# Patient Record
Sex: Male | Born: 1935 | Race: Black or African American | Hispanic: No | Marital: Married | Smoking: Former smoker
Health system: Southern US, Community
[De-identification: ages and names within clinical notes are randomized; demographics above are authoritative.]

## PROBLEM LIST (undated history)

## (undated) DIAGNOSIS — I350 Nonrheumatic aortic (valve) stenosis: Secondary | ICD-10-CM

## (undated) DIAGNOSIS — H02409 Unspecified ptosis of unspecified eyelid: Secondary | ICD-10-CM

## (undated) DIAGNOSIS — I10 Essential (primary) hypertension: Secondary | ICD-10-CM

## (undated) DIAGNOSIS — K219 Gastro-esophageal reflux disease without esophagitis: Secondary | ICD-10-CM

## (undated) DIAGNOSIS — E11319 Type 2 diabetes mellitus with unspecified diabetic retinopathy without macular edema: Secondary | ICD-10-CM

## (undated) DIAGNOSIS — R251 Tremor, unspecified: Secondary | ICD-10-CM

## (undated) DIAGNOSIS — H3530 Unspecified macular degeneration: Secondary | ICD-10-CM

## (undated) DIAGNOSIS — R269 Unspecified abnormalities of gait and mobility: Secondary | ICD-10-CM

## (undated) DIAGNOSIS — I639 Cerebral infarction, unspecified: Secondary | ICD-10-CM

## (undated) DIAGNOSIS — R011 Cardiac murmur, unspecified: Secondary | ICD-10-CM

## (undated) DIAGNOSIS — E119 Type 2 diabetes mellitus without complications: Secondary | ICD-10-CM

## (undated) DIAGNOSIS — H348392 Tributary (branch) retinal vein occlusion, unspecified eye, stable: Secondary | ICD-10-CM

## (undated) DIAGNOSIS — R202 Paresthesia of skin: Secondary | ICD-10-CM

## (undated) DIAGNOSIS — H3581 Retinal edema: Secondary | ICD-10-CM

## (undated) DIAGNOSIS — F32A Depression, unspecified: Secondary | ICD-10-CM

## (undated) DIAGNOSIS — M109 Gout, unspecified: Secondary | ICD-10-CM

## (undated) DIAGNOSIS — Z961 Presence of intraocular lens: Secondary | ICD-10-CM

## (undated) DIAGNOSIS — I2510 Atherosclerotic heart disease of native coronary artery without angina pectoris: Secondary | ICD-10-CM

## (undated) DIAGNOSIS — N400 Enlarged prostate without lower urinary tract symptoms: Secondary | ICD-10-CM

## (undated) HISTORY — DX: Essential (primary) hypertension: I10

## (undated) HISTORY — DX: Cerebral infarction, unspecified: I63.9

## (undated) HISTORY — DX: Unspecified abnormalities of gait and mobility: R26.9

## (undated) HISTORY — DX: Unspecified ptosis of unspecified eyelid: H02.409

## (undated) HISTORY — PX: COLONOSCOPY W/ POLYPECTOMY: SHX1380

## (undated) HISTORY — PX: PROSTATE SURGERY: SHX751

## (undated) HISTORY — DX: Paresthesia of skin: R20.2

## (undated) HISTORY — DX: Tremor, unspecified: R25.1

## (undated) HISTORY — DX: Unspecified macular degeneration: H35.30

## (undated) HISTORY — DX: Type 2 diabetes mellitus without complications: E11.9

---

## 1997-12-17 ENCOUNTER — Ambulatory Visit (HOSPITAL_COMMUNITY): Admission: RE | Admit: 1997-12-17 | Discharge: 1997-12-17 | Payer: Self-pay | Admitting: Endocrinology

## 2006-03-09 ENCOUNTER — Encounter (INDEPENDENT_AMBULATORY_CARE_PROVIDER_SITE_OTHER): Payer: Self-pay | Admitting: *Deleted

## 2006-03-09 ENCOUNTER — Ambulatory Visit (HOSPITAL_COMMUNITY): Admission: RE | Admit: 2006-03-09 | Discharge: 2006-03-09 | Payer: Self-pay | Admitting: *Deleted

## 2006-07-05 ENCOUNTER — Ambulatory Visit: Payer: Self-pay | Admitting: Cardiology

## 2006-07-05 ENCOUNTER — Inpatient Hospital Stay (HOSPITAL_COMMUNITY): Admission: EM | Admit: 2006-07-05 | Discharge: 2006-07-09 | Payer: Self-pay | Admitting: Emergency Medicine

## 2006-07-06 ENCOUNTER — Encounter: Payer: Self-pay | Admitting: Cardiology

## 2006-07-06 ENCOUNTER — Encounter: Payer: Self-pay | Admitting: Vascular Surgery

## 2006-08-11 ENCOUNTER — Encounter: Admission: RE | Admit: 2006-08-11 | Discharge: 2006-08-11 | Payer: Self-pay | Admitting: Endocrinology

## 2006-10-24 ENCOUNTER — Encounter (HOSPITAL_BASED_OUTPATIENT_CLINIC_OR_DEPARTMENT_OTHER): Admission: RE | Admit: 2006-10-24 | Discharge: 2006-11-09 | Payer: Self-pay | Admitting: Surgery

## 2008-01-22 ENCOUNTER — Emergency Department (HOSPITAL_COMMUNITY): Admission: EM | Admit: 2008-01-22 | Discharge: 2008-01-22 | Payer: Self-pay | Admitting: Emergency Medicine

## 2008-06-19 ENCOUNTER — Encounter (INDEPENDENT_AMBULATORY_CARE_PROVIDER_SITE_OTHER): Payer: Self-pay | Admitting: *Deleted

## 2008-06-19 ENCOUNTER — Ambulatory Visit (HOSPITAL_COMMUNITY): Admission: RE | Admit: 2008-06-19 | Discharge: 2008-06-19 | Payer: Self-pay | Admitting: *Deleted

## 2009-08-16 ENCOUNTER — Encounter: Admission: RE | Admit: 2009-08-16 | Discharge: 2009-08-16 | Payer: Self-pay | Admitting: Endocrinology

## 2009-12-18 HISTORY — PX: CATARACT EXTRACTION W/ INTRAOCULAR LENS IMPLANT: SHX1309

## 2010-09-06 ENCOUNTER — Emergency Department (HOSPITAL_COMMUNITY): Payer: Medicare Other

## 2010-09-06 ENCOUNTER — Inpatient Hospital Stay (HOSPITAL_COMMUNITY)
Admission: EM | Admit: 2010-09-06 | Discharge: 2010-09-07 | DRG: 726 | Disposition: A | Discharge status: Home / Self Care | Payer: Medicare Other | Attending: Internal Medicine | Admitting: Internal Medicine

## 2010-09-06 DIAGNOSIS — R310 Gross hematuria: Secondary | ICD-10-CM | POA: Diagnosis present

## 2010-09-06 DIAGNOSIS — N138 Other obstructive and reflux uropathy: Principal | ICD-10-CM | POA: Diagnosis present

## 2010-09-06 DIAGNOSIS — IMO0001 Reserved for inherently not codable concepts without codable children: Secondary | ICD-10-CM | POA: Diagnosis present

## 2010-09-06 DIAGNOSIS — I129 Hypertensive chronic kidney disease with stage 1 through stage 4 chronic kidney disease, or unspecified chronic kidney disease: Secondary | ICD-10-CM | POA: Diagnosis present

## 2010-09-06 DIAGNOSIS — D72829 Elevated white blood cell count, unspecified: Secondary | ICD-10-CM | POA: Diagnosis present

## 2010-09-06 DIAGNOSIS — N183 Chronic kidney disease, stage 3 unspecified: Secondary | ICD-10-CM | POA: Diagnosis present

## 2010-09-06 DIAGNOSIS — N401 Enlarged prostate with lower urinary tract symptoms: Principal | ICD-10-CM | POA: Diagnosis present

## 2010-09-06 DIAGNOSIS — R339 Retention of urine, unspecified: Secondary | ICD-10-CM | POA: Diagnosis present

## 2010-09-06 DIAGNOSIS — M109 Gout, unspecified: Secondary | ICD-10-CM | POA: Diagnosis present

## 2010-09-06 DIAGNOSIS — R109 Unspecified abdominal pain: Secondary | ICD-10-CM | POA: Diagnosis present

## 2010-09-06 DIAGNOSIS — Z794 Long term (current) use of insulin: Secondary | ICD-10-CM

## 2010-09-06 LAB — URINALYSIS, ROUTINE W REFLEX MICROSCOPIC
Nitrite: NEGATIVE
Protein, ur: 100 mg/dL — AB
Urobilinogen, UA: 0.2 mg/dL (ref 0.0–1.0)

## 2010-09-06 LAB — DIFFERENTIAL
Basophils Absolute: 0 10*3/uL (ref 0.0–0.1)
Basophils Relative: 0 % (ref 0–1)
Eosinophils Absolute: 0 10*3/uL (ref 0.0–0.7)
Eosinophils Relative: 0 % (ref 0–5)
Neutrophils Relative %: 92 % — ABNORMAL HIGH (ref 43–77)

## 2010-09-06 LAB — HEPATIC FUNCTION PANEL
Indirect Bilirubin: 1.2 mg/dL — ABNORMAL HIGH (ref 0.3–0.9)
Total Protein: 7.5 g/dL (ref 6.0–8.3)

## 2010-09-06 LAB — BASIC METABOLIC PANEL
Calcium: 10.1 mg/dL (ref 8.4–10.5)
Creatinine, Ser: 1.61 mg/dL — ABNORMAL HIGH (ref 0.4–1.5)
GFR calc Af Amer: 51 mL/min — ABNORMAL LOW (ref >60)
GFR calc non Af Amer: 42 mL/min — ABNORMAL LOW (ref >60)
Sodium: 134 mEq/L — ABNORMAL LOW (ref 135–145)

## 2010-09-06 LAB — CBC
Platelets: 313 10*3/uL (ref 150–400)
RBC: 5.29 MIL/uL (ref 4.22–5.81)
WBC: 21.7 10*3/uL — ABNORMAL HIGH (ref 4.0–10.5)

## 2010-09-06 LAB — URINE MICROSCOPIC-ADD ON

## 2010-09-07 ENCOUNTER — Inpatient Hospital Stay (HOSPITAL_COMMUNITY): Payer: Medicare Other

## 2010-09-07 LAB — CBC
Hemoglobin: 13.9 g/dL (ref 13.0–17.0)
MCH: 28.8 pg (ref 26.0–34.0)
MCHC: 34.7 g/dL (ref 30.0–36.0)
Platelets: 280 10*3/uL (ref 150–400)
WBC: 16 10*3/uL — ABNORMAL HIGH (ref 4.0–10.5)

## 2010-09-07 LAB — BASIC METABOLIC PANEL
BUN: 20 mg/dL (ref 6–23)
Chloride: 106 mEq/L (ref 96–112)
GFR calc Af Amer: 55 mL/min — ABNORMAL LOW (ref >60)
GFR calc non Af Amer: 45 mL/min — ABNORMAL LOW (ref >60)

## 2010-09-07 LAB — HEPATIC FUNCTION PANEL
ALT: 33 U/L (ref 0–53)
AST: 27 U/L (ref 0–37)
Albumin: 3.1 g/dL — ABNORMAL LOW (ref 3.5–5.2)
Alkaline Phosphatase: 48 U/L (ref 39–117)
Total Protein: 6.1 g/dL (ref 6.0–8.3)

## 2010-09-07 LAB — GLUCOSE, CAPILLARY
Glucose-Capillary: 208 mg/dL — ABNORMAL HIGH (ref 70–99)
Glucose-Capillary: 187 mg/dL — ABNORMAL HIGH (ref 70–99)
Glucose-Capillary: 86 mg/dL (ref 70–99)
Glucose-Capillary: 250 mg/dL — ABNORMAL HIGH (ref 70–99)

## 2010-09-07 LAB — URINALYSIS, ROUTINE W REFLEX MICROSCOPIC
Leukocytes, UA: NEGATIVE
Protein, ur: 30 mg/dL — AB
Urine Glucose, Fasting: NEGATIVE mg/dL
pH: 6 (ref 5.0–8.0)

## 2010-09-07 LAB — HEMOGLOBIN A1C
Hgb A1c MFr Bld: 7.2 % — ABNORMAL HIGH (ref <5.7)
Mean Plasma Glucose: 160 mg/dL — ABNORMAL HIGH (ref <117)

## 2010-09-07 LAB — URINE MICROSCOPIC-ADD ON

## 2010-09-07 LAB — DIFFERENTIAL
Lymphocytes Relative: 12 % (ref 12–46)
Lymphs Abs: 1.8 10*3/uL (ref 0.7–4.0)
Neutrophils Relative %: 79 % — ABNORMAL HIGH (ref 43–77)

## 2010-09-08 LAB — URINE CULTURE: Culture  Setup Time: 201202262106

## 2010-09-12 NOTE — Consult Note (Signed)
Johnny [name], Johnny [name]               ACCOUNT NO.:  [id number]  MEDICAL RECORD NO.:  [id number]           PATIENT TYPE:  I  LOCATION:  1314                         FACILITY:  [facility]  PHYSICIAN:  Heloise Purpura, MD      DATE OF BIRTH:  [birth year]  DATE OF CONSULTATION:  09/07/2010 DATE OF DISCHARGE:                                CONSULTATION   REASON FOR CONSULTATION:  Urinary retention and gross hematuria.  PHYSICIAN REQUESTING CONSULTATION:  [name], M.D.  HISTORY:  Mr. Leonhard is a 77 year old gentleman who has been followed by [name] for the past 20 years for history of BPH and voiding symptoms. He has been managed with chronic alpha-blocker therapy with tamsulosin. At his most recent office visit in September 2011, he was noted to have a stable voiding situation and continued on tamsulosin 0.4 mg p.o. q.h.s.  He apparently had developed worsening urinary frequency and some mild urinary incontinence and was seen by his primary care physician recently who started him on anticholinergic therapy with VESIcare. Yesterday morning, he developed painless gross hematuria and an inability to urinate and presented to the emergency department with severe lower abdominal pain.  A Foley catheter was inserted without difficulty with return of greater than 1 L of grossly bloody urine. This immediately resolved his abdominal pain symptoms.  He has denied any CVA tenderness, fever, or vomiting.  He has had mild associated nausea.  He was also noted to have a leukocytosis of 413[address].  He did undergo a CT scan in the emergency department which did not reveal any hydronephrosis but did reveal a Foley catheter within the expected position in the bladder.  He also had a large "bladder mass" possibly consistent with an intravesical lobe of an enlarged prostate.  He has been started empirically on IV Rocephin and was admitted to [facility] for further evaluation.  PAST MEDICAL  HISTORY: 1. Diabetes. 2. BPH. 3. Gout. 4. Hypertension.  PAST SURGICAL HISTORY:  None.  HOME MEDICATIONS: 1. Insulin. 2. Metformin. 3. Cardizem. 4. Allopurinol. 5. Bystolic. 6. VESIcare. 7. Tamsulosin. 8. Benicar.  ALLERGIES:  No known drug allergies.  SOCIAL HISTORY:  The patient is married.  He quit smoking approximately 40 years ago.  He denies alcohol or drug use.  FAMILY HISTORY:  He does have a brother with a history of prostate cancer.  There is also family history of hypertension, diabetes, and breast and lung cancer.  REVIEW OF SYSTEMS:  A complete review of systems was performed. Pertinent positives and negatives are as included above in the history of present illness.  PHYSICAL EXAMINATION:  VITAL SIGNS:  Temperature 98.3, pulse 76, blood pressure 190/87, respirations 20. CONSTITUTIONAL:  The patient is a well-nourished, well-developed age- appropriate male, in no acute distress. HEENT:  Normocephalic, atraumatic. NECK:  Supple without lymphadenopathy. CARDIOVASCULAR:  Regular rate and rhythm without obvious murmurs.  He does not have peripheral edema. PULMONARY:  Normal respiratory effort.  Lungs are clear to auscultation bilaterally. ABDOMEN:  Soft, nontender, nondistended without abdominal masses. BACK:  No CVA tenderness. GU:  The patient has normal external male genitalia with  an indwelling Foley catheter with grossly clear urine draining from the catheter. DRE:  Normal sphincter tone with a significantly enlarged prostate without prostatic nodularity. EXTREMITIES:  No edema. NEUROLOGIC:  Grossly intact.  LABORATORY DATA:  Urinalysis, too numerous to count red blood cells, 0 to 2 white blood cells.  White blood count on admission was 21.7 and has decreased to 16.0.  Hemoglobin 13.9.  Serum creatinine was 1.61 on admission and has decreased to 1.51.  RADIOLOGIC IMAGING:  I independently reviewed his CT scan of the abdomen and pelvis which was  performed without contrast.  There was no hydronephrosis.  His Foley catheter is in the appropriate position.  He does have a bladder mass that is identified and is consistent with an intravesical prostatic lobes.  IMPRESSION: 1. Gross hematuria. 2. Acute urinary retention.  RECOMMENDATIONS:  His urinary retention is likely related to the fact he has been recently placed on anticholinergic medication.  I would recommend that this be stopped and that he continue on alpha blocker medication and I will increase his tamsulosin to 0.8 mg.  He also may require further evaluation of his gross hematuria, although this would be most likely related to his BPH.  I will plan on [name] see Mr. Antone in followup in the hospital to discuss further recommendations and when it would be most appropriate to proceed with a subsequent voiding trial.     Heloise Purpura, MD     LB/MEDQ  D:  09/07/2010  T:  09/07/2010  Job:  [id number]  cc:   Lindaann Slough, M.D. Fax: [phone]  Brooke Bonito, M.D. Fax: [phone]  [name], M.D. Fax: [phone]  Electronically Signed by Heloise Purpura MD on 09/12/2010 08:03:27 AM

## 2010-09-14 NOTE — H&P (Signed)
NAMESLAYTON, [name] NO.:  [id number]  MEDICAL RECORD NO.:  [id number]           PATIENT TYPE:  E  LOCATION:  WLED                         FACILITY:  [facility]  PHYSICIAN:  [name], M.D. DATE OF BIRTH:  29-Mar-1933  DATE OF ADMISSION:  09/06/2010 DATE OF DISCHARGE:                             HISTORY & PHYSICAL   PRIMARY CARE PHYSICIAN:  [name], DO  CHIEF COMPLAINT:  Abdominal pain and distention.  HISTORY OF PRESENT ILLNESS:  This is a 77 year old male who was brought to the emergency department by his wife secondary to lower abdominal pain and difficulty passing his urine since the a.m.  The patient denies having any fever or chills.  He reports when he was able to pass a little bit of urine, the urine had blood in it.  The patient has a history of benign prostatic hypertrophy and sees urologist, [name]. The patient rates having lower abdominal pain, 10/10 and also states he has had nausea and vomiting.  The patient was seen in the emergency department.  A bladder scan was performed and the patient was found to have 800 mL of urine in his bladder.  A Foley catheter was then placed which yielded 1200 mL of bloody urine.  His laboratory studies revealed an elevated [place] at [address].  A urine culture and sensitivity was ordered and the patient was placed on IV Rocephin and referred for medical admission.  The emergency department physician, Dr. Clarene Duke was asked to consult urology as well on this patient's case.  PAST MEDICAL HISTORY:  Significant for hypertension, benign prostatic hypertrophy, gout, type 2 diabetes mellitus on insulin, hernia.  PAST SURGICAL HISTORY:  None.  MEDICATIONS:  At this time include: 1. Humalog insulin 50 units subcu b.i.d. 2. Metformin 1000 mg p.o. b.i.d. 3. Cardizem CD 240 mg 1 p.o. daily. 4. Allopurinol 100 mg one p.o. b.i.d. 5. Bystolic 20 mg one p.o. daily. 6. Biaxin 500 mg p.o. once daily. 7.  Vesicare 5 mg one p.o. daily. 8. Zegerid 1 tablet p.o. daily. 9. Benicar 40 mg one p.o. daily.  ALLERGIES:  No known drug allergies.  SOCIAL HISTORY:  The patient is married.  He is a former smoker.  He quit 40 years ago.  He is a nondrinker.  No history of illicit drug usage.  FAMILY HISTORY:  Positive for hypertension and diabetes in his mother. Positive for cancer in a sister who had breast cancer and 2 brothers with cancer, one brother had prostate cancer, the other brother had lung cancer.  REVIEW OF SYSTEMS:  Pertinents as mentioned above.  PHYSICAL EXAMINATION FINDINGS:  GENERAL:  This is a 77 year old mildly overweight elderly African American male who is in discomfort but no acute distress. VITAL SIGNS:  Temperature 97.5, blood pressure elevated at 236/125, now 204/102, heart rate 74, respirations 22, O2 sats 97%. HEENT EXAMINATION:  Normocephalic, atraumatic.  Pupils equally round and reactive to light.  Extraocular movements are intact.  Funduscopic benign.  Positive arcus senilis present.  There is no scleral icterus. No conjunctival erythema.  Nares are patent bilaterally.  Oropharynx is clear. NECK:  Supple.  Full range of motion.  No thyromegaly, adenopathy, jugular venous distention. CARDIOVASCULAR:  Regular rate and rhythm.  No murmurs, gallops or rubs appreciated. LUNGS:  Clear to auscultation bilaterally.  No rales, rhonchi or wheezes. ABDOMEN:  Positive bowel sounds, soft, tender in the suprapubic area. There is no rebound, no guarding. ABDOMEN:  Obese with mild distention.  There is no hepatosplenomegaly. EXTREMITIES:  Without cyanosis, clubbing or edema. NEUROLOGIC EXAMINATION:  Nonfocal.  LABORATORY STUDIES:  White blood cell count 21.7, hemoglobin 15.7, hematocrit 43.8, MCV 82.8, platelets 213, neutrophils 92%, lymphocytes 4%.  Sodium 134, potassium 4.7, chloride 100, CO2 22, BUN 22, creatinine 1.61 and glucose 287, albumin 3.9, AST 42, ALT 46, total  bilirubin 1.5. Lipase level 78.  Urinalysis reveals red color, turbid in appearance, specific gravity 1.008, pH 6.5, large urine hemoglobin, trace leukocyte esterase.  Urine microscopic with white blood cell 0 to 2, urine red blood cells too numerous to count.  CT scan of the abdomen and pelvis performed without contrast, massive enlargement of prostate gland or a large bladder mass, perinephric soft tissue stranding with fluid in the perinephric spaces are seen along the ureters bilaterally but there is only minimal dilatation of the ureters, 5.3 cm complex cyst in the upper pole of the right kidney, several low-density lesions in the right kidney, most likely being cysts.  Low-density lesion in the midportion of the left kidney as well.  Cholelithiasis seen as well.  ASSESSMENT:  A 77 year old male being admitted with: 1. Bilateral hydronephrosis. 2. Hematuria. 3. Abdominal pain. 4. Urinary tract infection, most likely pyelonephritis and cystitis. 5. Acute renal failure. 6. Malignant hypertension.  PLAN:  The patient will be admitted.  A Foley catheter has already been placed and the patient is now able to pass his urine.  Suspect bladder outlet obstruction from BPH.  A urine culture and sensitivity has been ordered and the patient has been placed on IV Rocephin therapy at this time.  The patient's regular medications will be further reviewed.  The patient has been placed on gentle IV fluids for rehydration therapy. Urology consultation has been requested.  The patient will be placed on sliding scale insulin coverage to cover his elevated blood sugars and his blood sugars will be monitored.  The patient's metformin will be discontinued at this time as well as his ACE inhibitor therapy and ARB therapy.  The patient will be placed in SCDs for DVT prophylaxis and further workup will ensue pending results of the patient's clinical course and his studies.     [name],  M.D.     HJ/MEDQ  D:  09/06/2010  T:  09/06/2010  Job:  [id number]  cc:   Dr. Noel Gerold  Electronically Signed by [name] M.D. on 09/14/2010 08:25:15 PM

## 2010-09-25 ENCOUNTER — Other Ambulatory Visit: Payer: Self-pay | Admitting: Urology

## 2010-09-25 ENCOUNTER — Encounter (HOSPITAL_COMMUNITY): Payer: Medicare Other | Attending: Urology

## 2010-09-25 DIAGNOSIS — Z01812 Encounter for preprocedural laboratory examination: Secondary | ICD-10-CM | POA: Insufficient documentation

## 2010-09-25 DIAGNOSIS — Z01810 Encounter for preprocedural cardiovascular examination: Secondary | ICD-10-CM | POA: Insufficient documentation

## 2010-09-25 LAB — CBC
HCT: 42.1 % (ref 39.0–52.0)
Hemoglobin: 14.2 g/dL (ref 13.0–17.0)
MCHC: 33.7 g/dL (ref 30.0–36.0)
MCV: 85.2 fL (ref 78.0–100.0)
RDW: 13.7 % (ref 11.5–15.5)

## 2010-09-25 LAB — COMPREHENSIVE METABOLIC PANEL
Albumin: 3.6 g/dL (ref 3.5–5.2)
BUN: 16 mg/dL (ref 6–23)
Calcium: 10 mg/dL (ref 8.4–10.5)
Glucose, Bld: 138 mg/dL — ABNORMAL HIGH (ref 70–99)
Total Protein: 7.2 g/dL (ref 6.0–8.3)

## 2010-09-25 LAB — APTT: aPTT: 35 seconds (ref 24–37)

## 2010-09-25 LAB — PROTIME-INR: INR: 1.01 (ref 0.00–1.49)

## 2010-09-25 LAB — URINE MICROSCOPIC-ADD ON

## 2010-09-25 LAB — ABO/RH: ABO/RH(D): B POS

## 2010-09-25 LAB — URINALYSIS, ROUTINE W REFLEX MICROSCOPIC
Bilirubin Urine: NEGATIVE
Nitrite: NEGATIVE
Specific Gravity, Urine: 1.011 (ref 1.005–1.030)
Urobilinogen, UA: 0.2 mg/dL (ref 0.0–1.0)
pH: 5.5 (ref 5.0–8.0)

## 2010-09-25 LAB — SURGICAL PCR SCREEN
MRSA, PCR: NEGATIVE
Staphylococcus aureus: NEGATIVE

## 2010-09-27 LAB — URINE CULTURE
Culture  Setup Time: 201203161516
Special Requests: NEGATIVE

## 2010-09-28 NOTE — Discharge Summary (Signed)
NAMEWISTER, Johnny [name]               ACCOUNT NO.:  [id number]  MEDICAL RECORD NO.:  [id number]           PATIENT TYPE:  I  LOCATION:  1314                         FACILITY:  [facility]  PHYSICIAN:  Marcellus Scott, MD     DATE OF BIRTH:  [birth year]  DATE OF ADMISSION:  09/06/2010 DATE OF DISCHARGE:  09/07/2010                              DISCHARGE SUMMARY   PRIMARY CARE PHYSICIAN:  [name], DO.  UROLOGIST:  Lindaann Slough, M.D.  DISCHARGE DIAGNOSES: 1. Urinary retention secondary to benign prostatic hypertrophy. 2. Hematuria, likely secondary to benign prostatic hypertrophy. 3. Hypertension. 4. Uncontrolled type 2 diabetes mellitus. 5. Leukocytosis, possibly stress-related 6. Possible stage 3 chronic kidney disease. 7. History of gout.  DISCHARGE MEDICATIONS: 1. Flomax increased to 0.8 mg p.o. q.h.s. 2. Allopurinol 200 mg p.o. q.p.m. 3. Benicar 40 mg p.o. daily. 4. Bystolic 20 mg p.o. daily. 5. Cardizem CD 240 mg p.o. daily. 6. Humalog mix 50/50, 48 units subcutaneously b.i.d. 7. Zegerid 10 mg p.o. daily.  DISCONTINUED MEDICATIONS: 1. Metformin 2. Biaxin 3. VESIcare  PROCEDURES:  Insertion of Foley catheter.  IMAGING:  CT of the abdomen and pelvis without contrast, impression: A.  There is either massive enlargement of the prostate gland or large mass in the bladder. B.  Evidence of recent bilateral hydronephrosis which may have been relieved by the insertion of the Foley catheter C.  Cholelithiasis. D.  Complex cyst in the upper pole of the right kidney.  PERTINENT LABS:  Hepatic panel only remarkable for albumin of 3.1. Basic metabolic panel with glucose 239, BUN 20, creatinine 1.51.  CBC; hemoglobin 13.9, hematocrit 40, white blood cell 16, platelets 280, lipase 78.  Urinalysis showed too numerous to count red blood cells,  0 to 2 white blood cells and field obscured by rbcs, sperm present.  CONSULTATIONS:  Urology, Dr. Heloise Purpura.  DIET:  Diabetic  diet.  ACTIVITY:  As tolerated.  COMPLAINTS TODAY:  None.  REVIEW OF SYSTEMS:  The patient denies any sore throat or earache or cough or chest pain or dyspnea or abdominal pain.  No nausea or vomiting.  He has no dysuria but just difficulty passing urine.  This is relieved with the Foley catheter.  PHYSICAL EXAMINATION:  GENERAL:  The patient is in no obvious distress. VITAL SIGNS:  Temperature 98.3 degrees Fahrenheit, pulse 76 per minute, respirations 20 per minute, blood pressure 190/87 mmHg and saturating at 92% on room air.  CBGs range between 208 to 216 mg/dL. RESPIRATORY SYSTEM:  Clear.  No increased work of breathing. CARDIOVASCULAR SYSTEM:  First and second heart sounds heard, regular. ABDOMEN:  Nondistended, nontender, soft, and bowel sounds present. CENTRAL NERVOUS SYSTEM:  The patient is awake, alert, oriented x3 with no focal neurological deficits. GENITOURINARY:  The patient has a Foley catheter with mildly blood tinged urine.  HOSPITAL COURSE:  Johnny [name] is a 77 year old African-American male patient with history of hypertension, type 2 diabetes mellitus who is on insulin; benign prostatic hypertrophy and gout who presented with the lower abdominal pain and difficulty passing urine.  He had been started on VESIcare approximately 1-1/2 weeks  ago by his primary care physician. Also when he tried to pass urine, he was able to void a small amount which had blood in it.  He had an episode of nausea and vomiting but no coffee-ground or blood.  In the emergency department, he was found to have urinary retention and CT suggesting of hydronephrosis.  Foley catheter was placed and 1200 mL of urine was drained.  He had elevated white cell count of 119[address], although his urinalysis was not suggestive of urinary tract infection.  He was admitted to the medical floor and started on IV Rocephin.Marland Kitchen  He is currently asymptomatic.  His blood pressure is slightly elevated.  I have  discussed his care with Dr. Laverle Patter who indicates that if he is stable from the medical standpoint, he can be discharged with the Foley catheter.  He, however, should not be on the VESIcare and his Flomax dose should be increased to 0.8 mg q.h.s.  Patient will follow up with [name] as an outpatient.  The patient has a preset appointment.  At this time, we will repeat his urinalysis and culture and a chest x-ray and if those are negative, will discharge the patient home on above medications.  DISPOSITION:  The patient will be discharged home in stable condition.  FOLLOWUP RECOMMENDATIONS: 1. With Dr. Su Grand on September 17, 2010, at 10:30 a.m. 2. For blood works at Dr. Vernia Buff Nesi's office on September 10, 2010. 3. With Dr. [name].  The patient is to call for an appointment.  Time taken in coordinating this discharge is 35 minutes.  Addendum: patient briefly had hypoglycemia due to inadvertant administration of larger than his usual doses of short acting insulin. His CBG;s were frequently checked and he and his spouse were updated of this error. Once stable, he was discharged home  and he and his spouse were advised to monitor for hypoglycemic symptoms and they were aware of the its management at home.    Marcellus Scott, MD     AH/MEDQ  D:  09/07/2010  T:  09/07/2010  Job:  [id number]  cc:   [name], DO  Lindaann Slough, M.D. Fax: [phone]  Heloise Purpura, MD Fax: [phone]  Electronically Signed by Marcellus Scott MD on 09/28/2010 01:17:22 AM

## 2010-09-29 ENCOUNTER — Inpatient Hospital Stay (HOSPITAL_COMMUNITY)
Admission: RE | Admit: 2010-09-29 | Discharge: 2010-10-05 | DRG: 708 | Disposition: A | Discharge status: Home / Self Care | Payer: Medicare Other | Source: Ambulatory Visit | Attending: Urology | Admitting: Urology

## 2010-09-29 ENCOUNTER — Other Ambulatory Visit: Payer: Self-pay | Admitting: Urology

## 2010-09-29 DIAGNOSIS — E119 Type 2 diabetes mellitus without complications: Secondary | ICD-10-CM | POA: Diagnosis present

## 2010-09-29 DIAGNOSIS — R338 Other retention of urine: Secondary | ICD-10-CM | POA: Diagnosis present

## 2010-09-29 DIAGNOSIS — I10 Essential (primary) hypertension: Secondary | ICD-10-CM | POA: Diagnosis present

## 2010-09-29 DIAGNOSIS — D649 Anemia, unspecified: Secondary | ICD-10-CM | POA: Diagnosis not present

## 2010-09-29 DIAGNOSIS — N401 Enlarged prostate with lower urinary tract symptoms: Principal | ICD-10-CM | POA: Diagnosis present

## 2010-09-29 DIAGNOSIS — R972 Elevated prostate specific antigen [PSA]: Secondary | ICD-10-CM | POA: Diagnosis present

## 2010-09-29 DIAGNOSIS — E7800 Pure hypercholesterolemia, unspecified: Secondary | ICD-10-CM | POA: Diagnosis present

## 2010-09-29 DIAGNOSIS — N138 Other obstructive and reflux uropathy: Principal | ICD-10-CM | POA: Diagnosis present

## 2010-09-29 LAB — BASIC METABOLIC PANEL
Chloride: 108 mEq/L (ref 96–112)
GFR calc non Af Amer: 51 mL/min — ABNORMAL LOW (ref >60)
Glucose, Bld: 257 mg/dL — ABNORMAL HIGH (ref 70–99)
Potassium: 4.6 mEq/L (ref 3.5–5.1)
Sodium: 136 mEq/L (ref 135–145)

## 2010-09-29 LAB — CBC
HCT: 30.1 % — ABNORMAL LOW (ref 39.0–52.0)
Hemoglobin: 10.2 g/dL — ABNORMAL LOW (ref 13.0–17.0)
MCV: 84.6 fL (ref 78.0–100.0)
RBC: 3.56 MIL/uL — ABNORMAL LOW (ref 4.22–5.81)
RDW: 13.6 % (ref 11.5–15.5)
WBC: 14.6 10*3/uL — ABNORMAL HIGH (ref 4.0–10.5)

## 2010-09-29 LAB — GLUCOSE, CAPILLARY
Glucose-Capillary: 369 mg/dL — ABNORMAL HIGH (ref 70–99)
Glucose-Capillary: 314 mg/dL — ABNORMAL HIGH (ref 70–99)

## 2010-09-30 LAB — GLUCOSE, CAPILLARY
Glucose-Capillary: 228 mg/dL — ABNORMAL HIGH (ref 70–99)
Glucose-Capillary: 230 mg/dL — ABNORMAL HIGH (ref 70–99)
Glucose-Capillary: 243 mg/dL — ABNORMAL HIGH (ref 70–99)
Glucose-Capillary: 259 mg/dL — ABNORMAL HIGH (ref 70–99)

## 2010-09-30 LAB — BASIC METABOLIC PANEL
BUN: 22 mg/dL (ref 6–23)
CO2: 24 mEq/L (ref 19–32)
Calcium: 8.5 mg/dL (ref 8.4–10.5)
Chloride: 102 mEq/L (ref 96–112)
Creatinine, Ser: 2.19 mg/dL — ABNORMAL HIGH (ref 0.4–1.5)
GFR calc Af Amer: 36 mL/min — ABNORMAL LOW (ref >60)
GFR calc Af Amer: 38 mL/min — ABNORMAL LOW (ref >60)
Glucose, Bld: 232 mg/dL — ABNORMAL HIGH (ref 70–99)
Potassium: 5.8 mEq/L — ABNORMAL HIGH (ref 3.5–5.1)
Sodium: 131 mEq/L — ABNORMAL LOW (ref 135–145)

## 2010-09-30 LAB — CBC
MCH: 28.5 pg (ref 26.0–34.0)
MCHC: 34 g/dL (ref 30.0–36.0)
Platelets: 287 10*3/uL (ref 150–400)
RBC: 2.84 MIL/uL — ABNORMAL LOW (ref 4.22–5.81)

## 2010-10-01 LAB — CREATININE, FLUID (PLEURAL, PERITONEAL, JP DRAINAGE)

## 2010-10-01 LAB — GLUCOSE, CAPILLARY
Glucose-Capillary: 175 mg/dL — ABNORMAL HIGH (ref 70–99)
Glucose-Capillary: 189 mg/dL — ABNORMAL HIGH (ref 70–99)

## 2010-10-01 LAB — BASIC METABOLIC PANEL
BUN: 21 mg/dL (ref 6–23)
CO2: 25 mEq/L (ref 19–32)
GFR calc non Af Amer: 38 mL/min — ABNORMAL LOW (ref >60)
Glucose, Bld: 181 mg/dL — ABNORMAL HIGH (ref 70–99)
Potassium: 4.7 mEq/L (ref 3.5–5.1)

## 2010-10-01 LAB — HEMOGLOBIN AND HEMATOCRIT, BLOOD
HCT: 24.3 % — ABNORMAL LOW (ref 39.0–52.0)
Hemoglobin: 6 g/dL — CL (ref 13.0–17.0)
Hemoglobin: 8.2 g/dL — ABNORMAL LOW (ref 13.0–17.0)

## 2010-10-02 LAB — CROSSMATCH: Unit division: 0

## 2010-10-02 LAB — BASIC METABOLIC PANEL
BUN: 15 mg/dL (ref 6–23)
CO2: 25 mEq/L (ref 19–32)
Calcium: 8.2 mg/dL — ABNORMAL LOW (ref 8.4–10.5)
Creatinine, Ser: 1.46 mg/dL (ref 0.4–1.5)
GFR calc non Af Amer: 47 mL/min — ABNORMAL LOW (ref >60)
Glucose, Bld: 202 mg/dL — ABNORMAL HIGH (ref 70–99)
Sodium: 137 mEq/L (ref 135–145)

## 2010-10-02 LAB — GLUCOSE, CAPILLARY
Glucose-Capillary: 205 mg/dL — ABNORMAL HIGH (ref 70–99)
Glucose-Capillary: 203 mg/dL — ABNORMAL HIGH (ref 70–99)

## 2010-10-02 LAB — CBC
HCT: 23.3 % — ABNORMAL LOW (ref 39.0–52.0)
MCH: 29.5 pg (ref 26.0–34.0)
MCHC: 33.9 g/dL (ref 30.0–36.0)
MCV: 86.9 fL (ref 78.0–100.0)
RDW: 14.6 % (ref 11.5–15.5)

## 2010-10-03 LAB — GLUCOSE, CAPILLARY
Glucose-Capillary: 198 mg/dL — ABNORMAL HIGH (ref 70–99)
Glucose-Capillary: 239 mg/dL — ABNORMAL HIGH (ref 70–99)

## 2010-10-03 LAB — CBC
HCT: 27.6 % — ABNORMAL LOW (ref 39.0–52.0)
Hemoglobin: 9.2 g/dL — ABNORMAL LOW (ref 13.0–17.0)
MCHC: 33.3 g/dL (ref 30.0–36.0)
MCV: 87.3 fL (ref 78.0–100.0)
RDW: 14.7 % (ref 11.5–15.5)
WBC: 10.9 10*3/uL — ABNORMAL HIGH (ref 4.0–10.5)

## 2010-10-04 LAB — GLUCOSE, CAPILLARY
Glucose-Capillary: 263 mg/dL — ABNORMAL HIGH (ref 70–99)
Glucose-Capillary: 337 mg/dL — ABNORMAL HIGH (ref 70–99)
Glucose-Capillary: 230 mg/dL — ABNORMAL HIGH (ref 70–99)

## 2010-10-05 LAB — GLUCOSE, CAPILLARY
Glucose-Capillary: 242 mg/dL — ABNORMAL HIGH (ref 70–99)
Glucose-Capillary: 274 mg/dL — ABNORMAL HIGH (ref 70–99)

## 2010-11-07 NOTE — Discharge Summary (Signed)
Johnny [name], Johnny [name]               ACCOUNT NO.:  [id number]  MEDICAL RECORD NO.:  [id number]           PATIENT TYPE:  I  LOCATION:  1432                         FACILITY:  [facility]  PHYSICIAN:  Danae Chen, M.D.  DATE OF BIRTH:  Oct 20, 1932  DATE OF ADMISSION:  09/29/2010 DATE OF DISCHARGE:  10/05/2010                              DISCHARGE SUMMARY   DISCHARGE DIAGNOSES: 1. Acute urinary retention. 2. Benign prostatic hypertrophy. 3. Elevated PSA. 4. Hypertension. 5. Hypercholesterolemia. 6. Diabetes.  PROCEDURE DONE:  Simple retropubic prostatectomy on September 29, 2010.  The patient is a 77 year old male who went into urinary retention about a month ago.  He was started on VESIcare by his primary care physician 2 days prior to that episode of urinary retention.  He went to the emergency room and a Foley catheter was left in the bladder.  CT scan showed normal kidneys.  The patient is known to have a markedly enlarged prostate.  He had at least 2 episodes of urinary retention before then. He had 5 negative prostate biopsies in the past for elevated PSA.  He has been on tamsulosin.  Cystoscopy showed a markedly enlarged prostate. The patient was admitted on September 29, 2010 for simple retropubic prostatectomy.  On physical examination, his blood pressure was 208/105, pulse 85, respirations 20, temperature 97.5.  Lungs were clear.  Heart, regular rhythm.  Abdomen, soft, nontender.  No CVA tenderness.  Kidneys not palpable.  No hepatomegaly, no splenomegaly.  Bladder not distended. Bowel sounds normal.  Genitalia, the penis is uncircumcised.  Scrotum is normal.  He has no testicular mass.  Cords and epididymis are within normal limits.  The patient had a simple retropubic prostatectomy on September 29, 2010. His hemoglobin gradually went down to 6.0.  He was then given 2 units of packed cells and his hemoglobin has since been going up and it was 9.2 on March 24.  His creatinine the day  after the surgery was up to 2.19 and on March 23, it went down to 1.46.  He was then started on clear liquid diet the day after the procedure and the diet was gradually advanced.  He tolerated his diet well and he has regular bowel movements.  The CBI was discontinued on March 23 and the urine has gradually cleared up and it is currently normal.  The Jackson-Pratt drain was removed on the third day postop and on March 26, he was afebrile.  He was eating well.  He has regular bowel movement.  He was then discharged home on all his home medications and Cipro 500 mg twice a day, Colace 100 mg twice a day, ferrous sulfate 1 tablet 3 times a day.  CONDITION ON DISCHARGE:  Improved.  DISCHARGE DIET:  An 1800 calories diabetic diet.  He is instructed not to do any lifting, straining, or driving.  He will be followed in the office in 3 days for Foley and skin staple removal.  Pathology report showed BPH and a prostate that measures 214 g.     Danae Chen, M.D.     MN/MEDQ  D:  10/05/2010  T:  10/05/2010  Job:  [id number]  Electronically Signed by Lindaann Slough M.D. on 11/07/2010 10:33:01 AM

## 2010-11-07 NOTE — Op Note (Signed)
[name], Johnny               ACCOUNT NO.:  [id number]  MEDICAL RECORD NO.:  [id number]           PATIENT TYPE:  I  LOCATION:  1432                         FACILITY:  [facility]  PHYSICIAN:  Danae Chen, M.D.  DATE OF BIRTH:  May 14, 1933  DATE OF PROCEDURE:  09/29/2010 DATE OF DISCHARGE:                              OPERATIVE REPORT   PREOPERATIVE DIAGNOSES:  Acute urinary retention, benign prostatic hyperplasia.  POSTOPERATIVE DIAGNOSES:  Acute urinary retention, benign prostatic hyperplasia.  PROCEDURE DONE:  Simple retropubic prostatectomy.  SURGEON:  Danae Chen, MD  ASSISTANT:  Delia Chimes, NP-C  ANESTHESIA:  General.  INDICATIONS:  The patient is a 77 year old male who went into urinary retention about 3 weeks ago.  He went to the emergency room and had a Foley catheter inserted in the bladder.  CT scan showed normal kidneys and markedly enlarged prostate.  The patient is known to have a very large prostate that measured 280 cc by ultrasound in February 2011. He had several negative prostate biopsies for elevated PSA.  Cystoscopy showed no bladder stone or tumor.  The patient failed 2 voiding trials on Flomax.  He is scheduled for simple retropubic prostatectomy.  The patient was identified by his wristband and proper time-out was taken.  PROCEDURE IN DETAIL:  Under general anesthesia, he was prepped and draped and placed in the supine position.  A #16 Foley catheter was inserted in the bladder.  A longitudinal incision was made in the suprapubic area from the symphysis pubis to about 2 cm below the umbilicus.  The incision was carried down to the rectus fascia which was then incised.  The recti muscles were split in the midline.  The retropubic space was entered.  The loose areolar tissues anterior to the bladder neck were dissected from the bladder and ligated.  Two stay sutures were placed on the anterior capsule of the prostate.  A transverse capsulotomy was  done.  The capsulotomy was carried down to the level of the prostate gland.  Then with blunt dissection, the prostate was dissected from the capsule of the prostate with index finger.  The patient has a markedly enlarged prostate and I had to split the lobes of the prostate to remove the left lobe and then the right lobe was removed.  Hemostasis was secured at the 5 and 7 o'clock position with #0 chromic.  There was no evidence of bleeding in the prostatic bed.  There was no evidence of tumor in the bladder.  The balloon of the Foley catheter then was then inflated with 60 cc of water.  Continuous bladder irrigation was started and return was clear. The capsulotomy was closed with #0 Vicryl.  The wound was then irrigated with normal saline.  A Blake drain was then placed in the retropubic space and brought out through a separate stab wound.  The fascia was closed with #0 PDS, subcutaneous tissue was approximated with #4-0 chromic. The skin was infiltrated with 0.25% Marcaine.  The skin was closed with skin staples.  The patient tolerated the procedure well and left the OR in satisfactory condition to  post-anesthesia care unit.     Danae Chen, M.D.     MN/MEDQ  D:  09/29/2010  T:  09/29/2010  Job:  [id number]  Electronically Signed by Lindaann Slough M.D. on 11/07/2010 10:31:31 AM

## 2010-11-07 NOTE — H&P (Signed)
Johnny [name], Johnny [name]               ACCOUNT NO.:  [id number]  MEDICAL RECORD NO.:  [id number]           PATIENT TYPE:  I  LOCATION:  0009                         FACILITY:  [facility]  PHYSICIAN:  Danae Chen, M.D.  DATE OF BIRTH:  05-28-1933  DATE OF ADMISSION:  09/29/2010 DATE OF DISCHARGE:                             HISTORY & PHYSICAL   CHIEF COMPLAINT:  Inability to urinate.  HISTORY OF PRESENT ILLNESS:  The patient is 77 year old male who went into urinary retention about 3 weeks ago.  He was started on VESIcare by his primary care physician about 2 days prior to that episode of urinary retention.  He went to the emergency room, and a Foley catheter was inserted in the bladder.  A CT scan showed normal kidneys and enlarged prostate.  The patient is known to have a markedly enlarged prostate that measured about 280 mL by ultrasound in February 2011.  He had at least 2 episodes of urinary retention before.  He has a history of elevated PSA and had at least 5 negative prostate biopsies in the past. He has been on tamsulosin.  Cystoscopy showed markedly enlarged prostate and no bladder stone or tumor.  Since the last episode of urinary retention, he failed 2 voiding trials.  Because of the size of his prostate gland, he is admitted today for simple retropubic prostatectomy.  PAST MEDICAL HISTORY:  Positive for: 1. Diabetes. 2. Gout. 3. Hypercholesterolemia. 4. Hypertension. 5. Macular degeneration.  FAMILY HISTORY:  There is a family history of diabetes.  He is married, has 3 children.  He quit smoking in 1987 and had smoked about a pack a day for 19 years.  He does not drink.  MEDICATIONS: 1. Allopurinol 100 mg a day. 2. Bystolic 20 mg a day. 3. Cardizem CD 240 mg a day. 4. Valturna 300/320 mg a day. 5. Zegerid 40 mg pack. 6. Pravastatin 80 mg. 7. Humalog 50/50.  ALLERGIES:  No known drug allergies.  FAMILY HISTORY:  Positive for diabetes.  His mother is 27 years old,  and she is in good health.  REVIEW OF SYSTEMS:  As noted in the HPI and everything else is negative.  PHYSICAL EXAMINATION:  GENERAL:  This is a well-developed 77 year old male who is in no acute distress at this time. VITAL SIGNS:  His blood pressure this morning was 208/105 but after being in the waiting area for few minutes, blood pressure started to go down.  His pulse rate is 85, respirations 20, and temperature 97.5. HEENT:  His head is normal.  He has pink conjunctivae.  Ears, nose, and throat are within normal limits. NECK:  Supple.  No cervical adenopathy.  No thyromegaly. CHEST:  Symmetrical. LUNGS:  Fully expanded and clear to percussion and auscultation. HEART:  Regular rhythm. ABDOMEN:  Soft, nondistended, and nontender.  He has no CVA tenderness. Kidneys are not palpable.  He has no hepatomegaly, no splenomegaly. Bladder is not distended at this time.  He has no inguinal adenopathy. Bowel sounds are normal. GENITALIA:  He has an indwelling Foley catheter that is draining clear urine.  Scrotum is  normal.  He has no testicular mass.  Cords and epididymis are within normal limits. EXTREMITIES:  Within normal limits.  He has no pedal edema.  No deformities.  Good peripheral pulses. RECTAL EXAMINATION:  Sphincter tone is normal.  Prostate is markedly enlarged, and there are no nodules.  IMPRESSION: 1. Acute urinary retention. 2. Benign prostatic hypertrophy. 3. Elevated prostate specific antigen. 4. Hypertension. 5. Hypercholesterolemia. 6. Gout. 7. Diabetes.     Danae Chen, M.D.     MN/MEDQ  D:  09/29/2010  T:  09/29/2010  Job:  [id number]  Electronically Signed by Lindaann Slough M.D. on 11/07/2010 10:29:20 AM

## 2010-11-24 NOTE — Op Note (Signed)
NAMEDAN, SCEARCE NO.:  [id number]   MEDICAL RECORD NO.:  [id number]          PATIENT TYPE:  AMB   LOCATION:  ENDO                         FACILITY:  [facility]   PHYSICIAN:  Georgiana Spinner, M.D.    DATE OF BIRTH:  1933-03-23   DATE OF PROCEDURE:  06/19/2008  DATE OF DISCHARGE:                               OPERATIVE REPORT   PROCEDURE:  Colonoscopy.   INDICATIONS:  Colon polyps.   ANESTHESIA:  Fentanyl 50 mcg, Versed 5 mg.   PROCEDURE:  With the patient mildly sedated in the left lateral  decubitus position, a rectal examination was performed.  The prostate  was symmetrically enlarged without nodules.  Subsequently, the Pentax  videoscopic colonoscope was inserted in the rectum and passed under  direct vision with pressure applied.  We reached the cecum identified by  ileocecal valve and appendiceal orifice, both of which were  photographed.  From this point, the colonoscope was slowly withdrawn  taking circumferential views of colonic mucosa, stopping in the  transverse colon where a polyp was seen, photographed, and removed using  hot biopsy forceps technique, setting of 20/150 blended current.  We  next stopped in descending colon where a second polyp was seen.  It,  too, was removed in the same manner.  Then, we proceeded to the rectum  which appeared normal on direct and showed hemorrhoids on retroflexed  view.  The endoscope was straightened and withdrawn.  The patient's  vital signs and pulse oximeter remained stable.  The patient tolerated  the procedure well without apparent complications.   FINDINGS:  Internal hemorrhoids, polyp of descending colon and  transverse colon.  Await biopsy report.  The patient will call me for  results and follow up with me as an outpatient.           ______________________________  Georgiana Spinner, M.D.     GMO/MEDQ  D:  06/19/2008  T:  06/19/2008  Job:  [id number]

## 2010-11-24 NOTE — Op Note (Signed)
Johnny [name], Johnny [name] NO.:  [id number]   MEDICAL RECORD NO.:  [id number]          PATIENT TYPE:  AMB   LOCATION:  ENDO                         FACILITY:  [facility]   PHYSICIAN:  Georgiana Spinner, M.D.    DATE OF BIRTH:  June 24, 1933   DATE OF PROCEDURE:  DATE OF DISCHARGE:                               OPERATIVE REPORT   PROCEDURE:  Upper endoscopy.   INDICATIONS:  Gastroesophageal reflux disease with Barrett's esophagus.   ANESTHESIA:  Fentanyl 75 mcg, Versed 7.5 mg.   PROCEDURE:  With the patient mildly sedated in the left lateral  decubitus position, the Pentax videoscopic endoscope was inserted in the  mouth and passed under direct vision through the esophagus which  appeared normal until we reached distal esophagus.  There were changes  of Barrett's, photographed and biopsies taken.  Entered into the stomach  fundus, body, antrum, duodenal bulb, second portion duodenum were  visualized.  From this point the endoscope was slowly withdrawn, taking  circumferential views of duodenal mucosa until the endoscope had been  pulled back into stomach, placed in retroflexion to view the stomach  from below.  The endoscope was straightened and withdrawn taking  circumferential views of the remaining gastric and esophageal mucosa.  The patient's vital signs, pulse oximeter remained stable.  The patient  tolerated procedure well without apparent complications.   FINDINGS:  Barrett's esophagus biopsied.  Await biopsy report.   The patient will call me for results and follow-up with me as an  outpatient.  Proceed to colonoscopy as planned.           ______________________________  Georgiana Spinner, M.D.     GMO/MEDQ  D:  06/19/2008  T:  06/19/2008  Job:  [id number]

## 2010-11-27 NOTE — H&P (Signed)
Johnny [name], Johnny [name]               ACCOUNT NO.:  [id number]   MEDICAL RECORD NO.:  [id number]          PATIENT TYPE:  INP   LOCATION:  5007                         FACILITY:  [facility]   PHYSICIAN:  Beckey Rutter, MD  DATE OF BIRTH:  04-Dec-1932   DATE OF ADMISSION:  07/05/2006  DATE OF DISCHARGE:                              HISTORY & PHYSICAL   CHIEF COMPLAINT:  Right leg swelling and pain.   HISTORY OF PRESENT ILLNESS:  This is a 77 year old African American male  with a past medical history significant for diabetes, hypertension,  hyperlipidemia, thrombocytic hyperplasia, gout and valvular heart  disease.  He has diabetes for the last 20 years.  Patient presented to  the emergency room today because of right leg swelling and pain that  started two days ago and is progressively worsening.  He said that he  does not have any similar condition before and the swelling started  towards the ankle and now kind of extended to the right knee.  There is  also some warmth and tenderness that he mentioned.  Patient stated that  the pain is now improved by medication given in the ED.  The swelling is  still there and he could not put weight on it, so he had to come to the  ER with the use of a wheelchair.   PAST MEDICAL HISTORY:  His past medical history is significant for:  1. Diabetes type 2 for the last 20 years.  He has been taking oral      hypoglycemic agents as well as insulin.  He is not checked      regularly because he was having insurance difficulties recently and      also he was not compliant with medication Actos.  2. Other past medical history is hypertension.  3. Hyperlipidemia.  4. Thrombocytic hyperplasia.  5. Gout.  6. Heart murmur.   FAMILY HISTORY:  Unremarkable.   SOCIAL HISTORY:  Lives with his wife and family.  Denied tobacco use or  alcohol.   DRUG ALLERGIES:  NOT KNOWN DRUG ALLERGIES.   MEDICATIONS:  1. Humalog insulin 50/50 twice a day, 50 units twice a  day.  2. Actos, he is not taking Actos regularly.  3. Cardizem 240 extended release.  4. Benicar 45 per 25.  5. Pravachol 40 mg nightly.  6. Allpurinol 100 mg twice a day.  7. Flomax 0.5 mg daily.   PHYSICAL EXAMINATION:  VITALS:  Blood pressure 154/85, pulse 103,  respiratory rate 22, temperature 98.4.  GENERAL:  He is comfortable in bed.  Not in distress.  No shortness of  breath.  HEENT:  Head atraumatic and normocephalic.  Eyes:  Bilaterally equal to  light.  Pupils equal to light bilaterally.  Mouth:  No ulcer, moist.  NECK:  Supple.  No JVD.  HEART:  First and second heart sounds audible.  Systolic murmur all over  the recorded region.  LUNGS:  Bilateral fair air entry.  EXTREMITIES:  Right leg is swollen and starts from the ankle all the way  up to around the knee.  Warmth and temperature.  The swelling is obvious  compared to the opposite leg.  There is no severe tenderness, but there  is color change to browns and reddish-brown around the sole.  NEUROLOGIC:  Patient alert and oriented x3, communicable and giving  history.  No weakness appreciated.  SKIN:  No abnormalities other than the change as described on the lower  extremity.   LABORATORY DATA:  White count 17.8, hemoglobin 14.2, hematocrit 41,  platelet 225, sodium 134, potassium 3.7, chloride 101, bicarb 25, BUN  28, creatinine 1.7, glucose 264.  EKG is pending.   IMPRESSION/ASSESSMENT:  This is a 77 year old male with longstanding  diabetes, seems to be uncontrolled now coming with right leg clinically  appearing as right leg cellulitis.   PLAN:  1. The patient will be admitted for further assessment and management.  2. The patient will be started on antibiotic, Unasyn and vancomycin.      We are going to add vancomycin for the possibility of community-      acquired methicillin-resistant Staphylococcus aureus pending the      culture and further followup and overall progression of the case.  3. Diabetes.   For diabetes patient will be started on insulin sliding      scale as well as the regular coverage.  We are going to continue      long-acting insulin twice a day.  We will check hemoglobin A1c.  We      will check urine for microalbumin and we will start Glucophage,      metformin orally.  Patient was taking Actos from before admission,      but because of the side effects of lower extremity swelling with      Actos, we will start the patient on metformin and we will continue      on metformin possibly upon discharge.  4. Elevated creatinine.  Patient has elevated creatinine of 1.7.  I      could not find any baseline creatinine to compare what seems to be      a chronic picture.  We will follow up the creatinine level.  We      will continue with gentle hydration for now, hoping for the      creatinine to improve.  If the creatinine continues to be elevated      the way it is, we will consider doing an ultrasound for the kidney      and investigate further for the cause of this renal function.      Chronic renal failure other than diabetes at this time.  5. Hypertension.  Patient might benefit from ACE inhibitors because of      his diabetes, but in the view of his elevated creatinine, we will      hold off on that.  We will continue patient on calcium channel      blocker, Cardizem same dose and reevaluate as needed.  Patient can      be started on ACE inhibitor if the baseline creatinine is allowable      upon discharge.  6. Hyperlipidemia.  Patient was taking Statins before admission.  We      will continue on Statin.  We will start Zocor 40 mg p.o. at night.  7. Gout.  Seems to be stable.  Patient was taking allopurinol 100 mg      twice a day.  We will continue the same dose.  8. Benign prostatic hyperplasia.  We  will continue Flomax as before      admission.  9. Heart murmur and heart condition.  Patient found to have heart     murmur upon admission.  We will order 2-D      echo  during this hospitalization and we will follow as needed      depending on the results of the echo.  10.For DVT prophylaxis, the patient will be started on Lovenox      prophylactic dose.  11.For GI prophylaxis, the patient will be started on Protonix 40 mg      daily.      Beckey Rutter, MD  Electronically Signed     EME/MEDQ  D:  07/05/2006  T:  07/06/2006  Job:  [id number]   cc:   Brooke Bonito, M.D.  Jaclyn Prime. Lucas Mallow, M.D.

## 2010-11-27 NOTE — Discharge Summary (Signed)
Johnny [name], Johnny [name]               ACCOUNT NO.:  [id number]   MEDICAL RECORD NO.:  [id number]          PATIENT TYPE:  INP   LOCATION:  5007                         FACILITY:  [facility]   PHYSICIAN:  Hillery Aldo, M.D.   DATE OF BIRTH:  [birth year]   DATE OF ADMISSION:  07/05/2006  DATE OF DISCHARGE:  07/09/2006                               DISCHARGE SUMMARY   PRIMARY CARE PHYSICIAN:  Dr. Juleen China.   DISCHARGE DIAGNOSES:  1. Right lower extremity cellulitis.  2. Poorly controlled diabetes.  3. Chronic renal insufficiency with a creatinine clearance of      approximately 48.6 mL per minute.  4. Hypertension.  5. Elevated TSH in the setting of normal free T4 studies.  6. Dyslipidemia.  7 Gouty arthritis.  8 History of thrombocytic hyperplasia.  1. Mild aortic valve stenosis.   DISCHARGE MEDICATIONS:  1. Avelox 400 mg daily through July 18, 2006.  2. Humalog insulin mix 50/50, 50 units subcutaneously q.a.m. and      q.p.m.  3. Cardizem CD 240 mg daily.  4. Benicar/HCTZ 40/25 daily.  5. Pravachol 40 mg daily.  6. Allopurinol 100 mg b.i.d.  7. Colchicine 0.6 mg daily.  8. Flomax 0.4 mg daily.  9. Aspirin 81 mg daily.  10.Protonix 40 mg daily.  11.Fish oil supplements 1200 mg b.i.d.  12.Lasix 40 mg daily p.r.n.  13.Metformin 1000 mg b.i.d.  14.Vicodin 5/500 1-2 tabs q.6 h. p.r.n.   BRIEF ADMISSION HISTORY OF PRESENT ILLNESS:  The patient is a 77-year-  old male who presented to the emergency department on July 05, 2006  with complaints of right lower extremity swelling and pain. Upon initial  evaluation he was found to have evidence of diabetic cellulitis and  therefore was admitted for IV antibiotic therapy. For the full details  of the patient's HPI, please see the dictated report done by Dr. Tamsen Roers.   PROCEDURES AND DIAGNOSTIC STUDIES:  1. Chest x-ray:  On July 05, 2006 showed no acute cardiopulmonary      disease.  2. Right lower extremity venous duplex studies  on July 06, 2006      showed no evidence for DVT or superficial thrombosis.  3. A 2-D echocardiogram on July 06, 2006 showed normal left      ventricular systolic function with an ejection fraction 60-65%.      There was no diagnostic evidence of left ventricular regional wall      motion abnormalities. The left ventricular wall thickness was      mildly increased. There was mild asymmetric septal hypertrophy.      There was grade 1 diastolic dysfunction. Aortic valve was mildly to      moderately calcified. There is mildly reduced aortic valve leaflet      excursion. Findings were consistent with mild aortic valve      stenosis. There was trivial aortic valvular regurgitation. The mean      transaortic valve gradient was 17.3 mmHg.   DISCHARGE LABORATORY VALUES:  CBC showed a white blood cell count 10.2,  hemoglobin 13.7, hematocrit 39.6, platelets 265.  Sodium was  133,  potassium 4.3, chloride 99, bicarb 24, BUN 22, creatinine 1.4, glucose  190.   ASSESSMENT/PLAN:  1. Right lower extremity cellulitis:  The patient was admitted, and      blood cultures were obtained which were negative at the time of      this dictation. The patient was empirically put on ampicillin and      vancomycin. He remained afebrile through the course of his      hospitalization. On hospital day #4 he was transitioned over to      p.o. Avelox and tolerated this therapy well. He will complete a two      week course of therapy with Avelox. He should follow up with his      primary care physician to survey his lower extremity to ensure that      the cellulitis is resolving. Over the course of his hospitalization      the erythema has subsided.  2. Uncontrolled diabetes:  The patient had fairly elevated blood      glucoses during the course of his hospitalization. His hemoglobin      A1c 7.7% indicating inadequate control. Given this his dose of      metformin was titrated up, and he should follow up with  his primary      care physician for ongoing diabetes education and control.  3. Chronic renal insufficiency:  The patient had mild exacerbation of      chronic renal insufficiency with a creatinine of 1.5 on admission.      His Lasix was held, and his creatinine has stabilized to 1.4.  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 His creatinine clearance indicates that he has stage III      chronic kidney disease. He should follow up with his primary care      physician for a nephrology referral.  4. Hypertension:  The patient's hypertension was controlled on his      usual home medications.  5. Elevated TSH:  The patient did have a have a mildly elevated TSH of      7.13. A check of his free T4 revealed that it was normal. He should      have a follow-up TSH done in six weeks to ensure that this has      normalized. This was likely secondary to sick euthyroid syndrome.  6. Dyslipidemia:  The patient was maintained on a heart healthy diet      and statin therapy. He will continue on statin therapy and fish oil      supplementation along with a low fat diet at discharge.   DISPOSITION:  The patient is stable for discharge back home. He should  follow up with Dr. Juleen China early next week. Again he should have a TSH  rechecked in six week's time. He should have his right lower extremity  evaluated for resolution of the cellulitis at the completion of his  antibiotic therapy.      Hillery Aldo, M.D.  Electronically Signed     CR/MEDQ  D:  07/09/2006  T:  07/09/2006  Job:  [id number]   cc:   Brooke Bonito, M.D.

## 2010-11-27 NOTE — Op Note (Signed)
NAMEEIVAN, GALLINA NO.:  [id number]   MEDICAL RECORD NO.:  [id number]          PATIENT TYPE:  AMB   LOCATION:  ENDO                         FACILITY:  [facility]   PHYSICIAN:  Georgiana Spinner, M.D.    DATE OF BIRTH:  1933-02-24   DATE OF PROCEDURE:  03/09/2006  DATE OF DISCHARGE:                                 OPERATIVE REPORT   PROCEDURE:  Upper endoscopy.   INDICATIONS:  GERD.   ANESTHESIA:  Fentanyl 50 mcg, Versed 5 mg.   PROCEDURE:  With the patient mildly sedated in the left lateral decubitus  position, the Olympus videoscopic endoscope was inserted into the mouth and  passed under direct vision through the esophagus which appeared normal until  we reached the distal esophagus where there were changes of Barrett's  esophagus, photographed and biopsied.  We entered into the stomach fundus,  body, antrum, duodenal bulb, and second portion duodenum appeared normal.  From this point the endoscope was slowly withdrawn taking circumferential  views of the duodenal mucosa until the endoscope had been pulled back, and  the stomach placed in retroflexion to view the stomach from below.  The  endoscope was then straightened and withdrawn taking circumferential views  of the remaining gastric and esophageal mucosa.  The patient's vital signs  and pulse oximeter remained stable.  The patient tolerated the procedure  well without apparent complications.   FINDINGS:  Barrett's esophagus biopsied.  Await biopsy report.  The patient  will call me for results and follow up with me as an outpatient.  Proceed to  colonoscopy as planned.           ______________________________  Georgiana Spinner, M.D.     GMO/MEDQ  D:  03/09/2006  T:  03/09/2006  Job:  [id number]

## 2010-11-27 NOTE — Op Note (Signed)
[name], Johnny [name] NO.:  [id number]   MEDICAL RECORD NO.:  [id number]          PATIENT TYPE:  AMB   LOCATION:  ENDO                         FACILITY:  [facility]   PHYSICIAN:  Georgiana Spinner, M.D.    DATE OF BIRTH:  29-Nov-1932   DATE OF PROCEDURE:  03/09/2006  DATE OF DISCHARGE:                                 OPERATIVE REPORT   PROCEDURE:  Colonoscopy.   INDICATIONS:  Colon polyps.   ANESTHESIA:  Fentanyl 25 mcg, Versed 3 mg.   PROCEDURE:  With the patient mildly sedated in the left lateral decubitus  position, the Olympus videoscopic colonoscope was inserted in the rectum  after a rectal examination was performed and showed a symmetrical non  nodular prostate.  Subsequently, the colonoscope was inserted and passed  under direct vision to the cecum identified by ileocecal valve and  appendiceal orifice, both of which were photographed.  The prep was somewhat  suboptimal in that there were areas of sticky brown stool that could not  really be easily washed and suctioned, but from this point, the colonoscope  was slowly withdrawn taking circumferential views of the colonic mucosa  stopping first in the proximal transverse colon where a small polyp was  seen, photographed, and removed using hot biopsy forceps technique, setting  of 20/200 blended current. We next stopped at 35 cm from anal verge at which  point another polyp was seen and it, too, was photographed and it was  removed using snare technique and suctioned into the trap for retrieval.  We  stopped then in the rectum which appeared normal on direct and showed  hemorrhoids on retroflexed view. The endoscope was straightened and  withdrawn.  The patient's vital signs and pulse oximeter remained stable.  The patient tolerated the procedure well without apparent complications.   FINDINGS:  Polyps as described above in the proximal transverse colon and  descending colon.  Await biopsy reports.  The patient  will call me for  results and follow-up with me as an outpatient.           ______________________________  Georgiana Spinner, M.D.     GMO/MEDQ  D:  03/09/2006  T:  03/09/2006  Job:  [id number]

## 2010-11-27 NOTE — Assessment & Plan Note (Signed)
[facility]   NAME:  BRIX, BREARLEY               ACCOUNT NO.:  [id number]   MEDICAL RECORD NO.:  [id number]      DATE OF BIRTH:  [birth year]   PHYSICIAN:  Jake Shark A. Tanda Rockers, M.D.      VISIT DATE:                                   OFFICE VISIT   REASON FOR CONSULTATION:  Mr. Hanway is a 77 year old man, referred by  Dr. Juleen China, for evaluation of an ulceration on his right lower extremity.   IMPRESSION:  Resolved stasis ulcer.   RECOMMENDATIONS:  Bilateral below the knee 30-40 mm graduated  compression hose with opened toes.   SUBJECTIVE:  Mr. Bayron is a 77 year old man who relates a history of,  going back 4 months to, swelling in the right lower extremity associated  with redness and fever.  The patient was admitted to the hospital.  At  that time, underwent a course of antibiotic therapy, having negative  duplex scan and was ultimately discharged on antibiotics with resolution  of the cellulitis.  Since that time, he has had swelling, which has been  more prominent in the evening, associated with itching.  Most recently,  2 weeks ago, he had an ulceration on his lateral leg.  He was seen by  Dr. Juleen China and was started again on antibiotics and had resolution of the  ulcer.  In the interim, he was referred to the wound center.   PAST MEDICAL HISTORY:  Remarkable for:  1. Diabetes.  2. Gout.  3. Hypertension.  4. Urinary urgency.  5. Macular degeneration.   CURRENT MEDICATIONS:  List includes:  1. Benicar hydrochlorothiazide combination capsule 40/25.  2. Cartia 240 mg daily.  3. Humalog 50/50 per sliding scale.  4. Allopurinol 100 mg b.i.d.  5. Metformin 1000 mg daily.   HE DENIES ALLERGIES.   He denies previous surgery.   FAMILY HISTORY:  Positive for breast cancer, lung cancer, degenerative  joint disease, heart attacks.   SOCIAL HISTORY:  He is retired.  He has 3 children.  Two live locally, 1  remotely.   REVIEW OF SYSTEMS:  Remarkable for  some decreased visual acuity related  to his macular degeneration.  He is currently being managed by Dr.  Luciana Axe.  He denies polydipsia, polyuria.  His weight has been stable  over the past year.  He denies bowel or bladder dysfunction.  He denies  transient paralysis, speech impediments or stigmata of transient  ischemic attacks.  He specifically denies angina pectoris, hemoptysis or  exertional dyspnea.  He is a reformed smoker.  The remainder of the  review of systems is negative.   PHYSICAL EXAMINATION:  GENERAL:  He is an alert, oriented man in no  acute distress, in good contact with reality.  VITAL SIGNS:  Blood pressure is 137/76, respirations are 20, pulse rate  75, temperature is 97.6.  Capillary blood glucose is 137 mg%.  HEENT EXAM:  Clear.  NECK:  Supple.  Trachea is midline.  Thyroid is nonpalpable.  ABDOMEN:  Soft.  EXTREMITY EXAM:  Remarkable for bilateral changes of stasis, which are  more intense on the right.  On the right, there is 2+ edema with a  fresh, matured eschar on the lateral aspect of the lower extremity.  There is no drainage.  There is no opened ulceration.  The wound is  completely reepitheilized.  The dorsalis pedis pulse are +3 and  bounding.  The patient is insensate on the volar aspect of the foot to  [employer]  There is no regional adenopathy.   DISCUSSION:  The patient has a history and a physical examination  consistent with stasis ulceration.  We have explained the utilization of  compression hose to the patient in terms that he seems to understand.  We have provided him with a prescription for bilateral 30-40 mm open toe  compression hose.  We have instructed him in the procurement and the  wear of the same.  We have given the patient an opportunity to asks  questions.  He seems to understand the clinical impression and  management.  He expresses gratitude for having been seen in the clinic  and indicates that he will be  compliant.      Harold A. Tanda Rockers, M.D.  Electronically Signed     HAN/MEDQ  D:  10/25/2006  T:  10/25/2006  Job:  [id number]   cc:   Brooke Bonito, M.D.

## 2011-04-08 LAB — URINALYSIS, ROUTINE W REFLEX MICROSCOPIC
Bilirubin Urine: NEGATIVE
Nitrite: NEGATIVE
Protein, ur: 30 — AB
Urobilinogen, UA: 0.2

## 2011-04-16 LAB — GLUCOSE, CAPILLARY: Glucose-Capillary: 190 mg/dL — ABNORMAL HIGH (ref 70–99)

## 2011-06-11 DIAGNOSIS — E119 Type 2 diabetes mellitus without complications: Secondary | ICD-10-CM | POA: Insufficient documentation

## 2011-08-13 DIAGNOSIS — Z8673 Personal history of transient ischemic attack (TIA), and cerebral infarction without residual deficits: Secondary | ICD-10-CM | POA: Insufficient documentation

## 2011-09-08 ENCOUNTER — Other Ambulatory Visit: Payer: Self-pay | Admitting: Endocrinology

## 2011-09-08 DIAGNOSIS — R270 Ataxia, unspecified: Secondary | ICD-10-CM

## 2011-09-11 ENCOUNTER — Ambulatory Visit
Admission: RE | Admit: 2011-09-11 | Discharge: 2011-09-11 | Disposition: A | Discharge status: Home / Self Care | Payer: Medicare Other | Source: Ambulatory Visit | Attending: Endocrinology | Admitting: Endocrinology

## 2011-09-11 DIAGNOSIS — R270 Ataxia, unspecified: Secondary | ICD-10-CM

## 2011-09-11 MED ORDER — GADOBENATE DIMEGLUMINE 529 MG/ML IV SOLN
20.0000 mL | Freq: Once | INTRAVENOUS | Status: AC | PRN
  Administered 2011-09-11: 20 mL via INTRAVENOUS

## 2011-09-14 DIAGNOSIS — H35719 Central serous chorioretinopathy, unspecified eye: Secondary | ICD-10-CM | POA: Insufficient documentation

## 2011-09-23 ENCOUNTER — Ambulatory Visit: Payer: Medicare Other | Attending: Neurology | Admitting: Physical Therapy

## 2011-09-23 DIAGNOSIS — IMO0001 Reserved for inherently not codable concepts without codable children: Secondary | ICD-10-CM | POA: Insufficient documentation

## 2011-09-23 DIAGNOSIS — I69998 Other sequelae following unspecified cerebrovascular disease: Secondary | ICD-10-CM | POA: Insufficient documentation

## 2011-09-23 DIAGNOSIS — R269 Unspecified abnormalities of gait and mobility: Secondary | ICD-10-CM | POA: Insufficient documentation

## 2011-09-23 DIAGNOSIS — M6281 Muscle weakness (generalized): Secondary | ICD-10-CM | POA: Insufficient documentation

## 2011-09-27 ENCOUNTER — Ambulatory Visit: Payer: Medicare Other | Admitting: Physical Therapy

## 2011-09-29 ENCOUNTER — Ambulatory Visit: Payer: Medicare Other | Admitting: Physical Therapy

## 2011-10-01 ENCOUNTER — Other Ambulatory Visit: Payer: Self-pay | Admitting: Neurology

## 2011-10-01 ENCOUNTER — Ambulatory Visit: Payer: Medicare Other | Admitting: Physical Therapy

## 2011-10-01 DIAGNOSIS — I639 Cerebral infarction, unspecified: Secondary | ICD-10-CM

## 2011-10-01 DIAGNOSIS — H02409 Unspecified ptosis of unspecified eyelid: Secondary | ICD-10-CM

## 2011-10-01 DIAGNOSIS — R269 Unspecified abnormalities of gait and mobility: Secondary | ICD-10-CM

## 2011-10-04 ENCOUNTER — Ambulatory Visit: Payer: Medicare Other | Admitting: Physical Therapy

## 2011-10-07 ENCOUNTER — Ambulatory Visit: Payer: Medicare Other | Admitting: Physical Therapy

## 2011-10-07 ENCOUNTER — Ambulatory Visit
Admission: RE | Admit: 2011-10-07 | Discharge: 2011-10-07 | Disposition: A | Discharge status: Home / Self Care | Payer: Medicare Other | Source: Ambulatory Visit | Attending: Neurology | Admitting: Neurology

## 2011-10-07 DIAGNOSIS — I639 Cerebral infarction, unspecified: Secondary | ICD-10-CM

## 2011-10-07 DIAGNOSIS — H02409 Unspecified ptosis of unspecified eyelid: Secondary | ICD-10-CM

## 2011-10-07 DIAGNOSIS — R269 Unspecified abnormalities of gait and mobility: Secondary | ICD-10-CM

## 2011-10-07 MED ORDER — GADOBENATE DIMEGLUMINE 529 MG/ML IV SOLN
18.0000 mL | Freq: Once | INTRAVENOUS | Status: AC | PRN
  Administered 2011-10-07: 18 mL via INTRAVENOUS

## 2011-10-11 ENCOUNTER — Ambulatory Visit: Payer: Medicare Other | Admitting: Physical Therapy

## 2011-10-13 ENCOUNTER — Ambulatory Visit: Payer: Medicare Other | Attending: Neurology | Admitting: Physical Therapy

## 2011-10-13 DIAGNOSIS — R269 Unspecified abnormalities of gait and mobility: Secondary | ICD-10-CM | POA: Insufficient documentation

## 2011-10-13 DIAGNOSIS — I69998 Other sequelae following unspecified cerebrovascular disease: Secondary | ICD-10-CM | POA: Insufficient documentation

## 2011-10-13 DIAGNOSIS — M6281 Muscle weakness (generalized): Secondary | ICD-10-CM | POA: Insufficient documentation

## 2011-10-13 DIAGNOSIS — IMO0001 Reserved for inherently not codable concepts without codable children: Secondary | ICD-10-CM | POA: Insufficient documentation

## 2011-10-15 ENCOUNTER — Ambulatory Visit: Payer: Medicare Other | Admitting: Physical Therapy

## 2011-10-18 ENCOUNTER — Ambulatory Visit: Payer: Medicare Other | Admitting: Physical Therapy

## 2011-10-20 ENCOUNTER — Ambulatory Visit: Payer: Medicare Other | Admitting: Physical Therapy

## 2011-10-26 ENCOUNTER — Ambulatory Visit: Payer: Medicare Other | Admitting: Physical Therapy

## 2011-10-28 ENCOUNTER — Ambulatory Visit: Payer: Medicare Other | Admitting: Physical Therapy

## 2011-10-29 ENCOUNTER — Ambulatory Visit: Payer: Medicare Other | Admitting: Physical Therapy

## 2011-11-02 ENCOUNTER — Ambulatory Visit: Payer: Medicare Other | Admitting: Physical Therapy

## 2011-11-04 ENCOUNTER — Ambulatory Visit: Payer: Medicare Other | Admitting: Physical Therapy

## 2011-11-09 ENCOUNTER — Ambulatory Visit: Payer: Medicare Other | Admitting: Physical Therapy

## 2011-11-11 ENCOUNTER — Ambulatory Visit: Payer: Medicare Other | Attending: Neurology | Admitting: Physical Therapy

## 2011-11-11 DIAGNOSIS — I69998 Other sequelae following unspecified cerebrovascular disease: Secondary | ICD-10-CM | POA: Insufficient documentation

## 2011-11-11 DIAGNOSIS — M6281 Muscle weakness (generalized): Secondary | ICD-10-CM | POA: Insufficient documentation

## 2011-11-11 DIAGNOSIS — IMO0001 Reserved for inherently not codable concepts without codable children: Secondary | ICD-10-CM | POA: Insufficient documentation

## 2011-11-11 DIAGNOSIS — R269 Unspecified abnormalities of gait and mobility: Secondary | ICD-10-CM | POA: Insufficient documentation

## 2011-11-16 ENCOUNTER — Ambulatory Visit: Payer: Medicare Other | Admitting: Physical Therapy

## 2011-11-18 ENCOUNTER — Ambulatory Visit: Payer: Medicare Other | Admitting: Physical Therapy

## 2011-12-27 ENCOUNTER — Other Ambulatory Visit (HOSPITAL_COMMUNITY): Payer: Self-pay | Admitting: Neurology

## 2011-12-27 DIAGNOSIS — I639 Cerebral infarction, unspecified: Secondary | ICD-10-CM

## 2011-12-31 ENCOUNTER — Ambulatory Visit (HOSPITAL_COMMUNITY): Payer: Medicare Other | Attending: Cardiovascular Disease

## 2011-12-31 DIAGNOSIS — I517 Cardiomegaly: Secondary | ICD-10-CM | POA: Insufficient documentation

## 2011-12-31 DIAGNOSIS — I639 Cerebral infarction, unspecified: Secondary | ICD-10-CM

## 2011-12-31 DIAGNOSIS — R011 Cardiac murmur, unspecified: Secondary | ICD-10-CM | POA: Insufficient documentation

## 2011-12-31 DIAGNOSIS — I359 Nonrheumatic aortic valve disorder, unspecified: Secondary | ICD-10-CM | POA: Insufficient documentation

## 2011-12-31 DIAGNOSIS — I6789 Other cerebrovascular disease: Secondary | ICD-10-CM

## 2011-12-31 DIAGNOSIS — E119 Type 2 diabetes mellitus without complications: Secondary | ICD-10-CM | POA: Insufficient documentation

## 2011-12-31 DIAGNOSIS — I059 Rheumatic mitral valve disease, unspecified: Secondary | ICD-10-CM | POA: Insufficient documentation

## 2011-12-31 NOTE — Progress Notes (Signed)
Echocardiogram performed.  

## 2012-01-03 ENCOUNTER — Encounter (HOSPITAL_COMMUNITY): Payer: Self-pay | Admitting: Neurology

## 2012-01-10 ENCOUNTER — Encounter: Payer: Self-pay | Admitting: Cardiovascular Disease

## 2012-01-10 ENCOUNTER — Encounter: Payer: Self-pay | Admitting: *Deleted

## 2012-01-11 ENCOUNTER — Institutional Professional Consult (permissible substitution): Payer: Medicare Other | Admitting: Cardiovascular Disease

## 2012-02-23 DIAGNOSIS — H02409 Unspecified ptosis of unspecified eyelid: Secondary | ICD-10-CM | POA: Insufficient documentation

## 2012-02-23 DIAGNOSIS — H3530 Unspecified macular degeneration: Secondary | ICD-10-CM | POA: Insufficient documentation

## 2012-02-23 DIAGNOSIS — I10 Essential (primary) hypertension: Secondary | ICD-10-CM | POA: Insufficient documentation

## 2012-02-23 DIAGNOSIS — E119 Type 2 diabetes mellitus without complications: Secondary | ICD-10-CM | POA: Insufficient documentation

## 2012-02-23 DIAGNOSIS — I639 Cerebral infarction, unspecified: Secondary | ICD-10-CM | POA: Insufficient documentation

## 2012-02-23 DIAGNOSIS — R269 Unspecified abnormalities of gait and mobility: Secondary | ICD-10-CM | POA: Insufficient documentation

## 2012-02-23 DIAGNOSIS — R202 Paresthesia of skin: Secondary | ICD-10-CM | POA: Insufficient documentation

## 2012-02-23 DIAGNOSIS — Z8673 Personal history of transient ischemic attack (TIA), and cerebral infarction without residual deficits: Secondary | ICD-10-CM | POA: Insufficient documentation

## 2012-02-25 ENCOUNTER — Encounter: Payer: Self-pay | Admitting: Cardiovascular Disease

## 2012-02-25 ENCOUNTER — Ambulatory Visit (INDEPENDENT_AMBULATORY_CARE_PROVIDER_SITE_OTHER): Payer: Medicare Other | Admitting: Cardiovascular Disease

## 2012-02-25 ENCOUNTER — Telehealth: Payer: Self-pay | Admitting: *Deleted

## 2012-02-25 VITALS — BP 136/83 | HR 96 | Ht 69.0 in | Wt 213.8 lb

## 2012-02-25 DIAGNOSIS — I350 Nonrheumatic aortic (valve) stenosis: Secondary | ICD-10-CM | POA: Insufficient documentation

## 2012-02-25 DIAGNOSIS — R9431 Abnormal electrocardiogram [ECG] [EKG]: Secondary | ICD-10-CM

## 2012-02-25 DIAGNOSIS — E119 Type 2 diabetes mellitus without complications: Secondary | ICD-10-CM

## 2012-02-25 DIAGNOSIS — I6350 Cerebral infarction due to unspecified occlusion or stenosis of unspecified cerebral artery: Secondary | ICD-10-CM

## 2012-02-25 DIAGNOSIS — I639 Cerebral infarction, unspecified: Secondary | ICD-10-CM

## 2012-02-25 DIAGNOSIS — I10 Essential (primary) hypertension: Secondary | ICD-10-CM

## 2012-02-25 DIAGNOSIS — I359 Nonrheumatic aortic valve disorder, unspecified: Secondary | ICD-10-CM

## 2012-02-25 NOTE — Assessment & Plan Note (Signed)
Represents LVH with LAD from AS and HTN  R.O CAD with cath when evaluating AS

## 2012-02-25 NOTE — Progress Notes (Signed)
Patient ID: Johnny [name], male   DOB: May 10, 1933, 78 y.o.   MRN: [id number] 78 yo previously followed by Dr Johnny [name] for murmur.  2007 mild AS.  Sees Johnny [name] as his primary.  3/13 had gait problems and diagnosed with subacute stroke.  MRI with subcortical abnormality.   Abnormal enhancement the right external capsule and periventricular white matter on the right compatible with subacute infarcts. These correspond with a 2-3 week duration as given in the clinical history.   Was not hospitalized.  Underwent rehab.  Referred by murmur, recent stroke.  Reviewed recent echo done 12/31/11  Mean gradient 41 and peak 64  Study Conclusions  - Left ventricle: The cavity size was normal. Wall thickness was increased in a pattern of moderate LVH. Systolic function was normal. The estimated ejection fraction was in the range of 55% to 60%. - Aortic valve: Moderately to severely calcified annulus. Severely calcified leaflets. There was severe stenosis. Trivial regurgitation. - Mitral valve: Mild regurgitation. - Left atrium: The atrium was mildly dilated. - Atrial septum: No defect or patent foramen ovale was identified. - Pulmonary arteries: PA peak pressure: 34mm Hg (S).  Exertional dyspnea.  Activity limited by gait.  No history of CAD  No chest pain.    ROS: Denies fever, malais, weight loss, blurry vision, decreased visual acuity, cough, sputum, SOB, hemoptysis, pleuritic pain, palpitaitons, heartburn, abdominal pain, melena, lower extremity edema, claudication, or rash.  All other systems reviewed and negative CRF;s elevated lipids, HTN and DM     General: Affect appropriate Overweight black male HEENT: normal Neck supple with no adenopathy JVP normal no bruits no thyromegaly Lungs clear with no wheezing and good diaphragmatic motion Heart:  S1/S2 severe AS murmur no ,rub, gallop or click PMI normal Abdomen: benighn, BS positve, no tenderness, no AAA no bruit.  No HSM or HJR Distal pulses  intact with no bruits No edema Neuro non-focal antalgic gait Skin warm and dry No muscular weakness  Medications Current Outpatient Prescriptions  Medication Sig Dispense Refill  . allopurinol (ZYLOPRIM) 100 MG tablet Take 1 tablet by mouth BID times 48H.      Marland Kitchen HUMALOG MIX 50/50 (50-50) 100 UNIT/ML SUSP Sliding scale      . hydrALAZINE (APRESOLINE) 50 MG tablet Take 1 tablet by mouth BID times 48H.      . metFORMIN (GLUCOPHAGE) 1000 MG tablet Take 1 tablet by mouth BID times 48H.      . methotrexate (RHEUMATREX) 2.5 MG tablet Take 1 tablet by mouth Once a week.      Marya Landry 40-10-25 MG TABS Take 1 tablet by mouth Daily.      Marland Kitchen ZETIA 10 MG tablet Take 1 tablet by mouth Daily.        Allergies Review of patient's allergies indicates no known allergies.  Family History: No family history on file.  Social History: History   Social History  . Marital Status: Married    Spouse Name: N/A    Number of Children: N/A  . Years of Education: N/A   Occupational History  . Not on file.   Social History Main Topics  . Smoking status: Former Games developer  . Smokeless tobacco: Not on file  . Alcohol Use: No  . Drug Use: No  . Sexually Active:    Other Topics Concern  . Not on file   Social History Narrative  . No narrative on file    Electrocardiogram:  NSR rate 96  LAD LVH with repolarization  abnormality  Assessment and Plan

## 2012-02-25 NOTE — Assessment & Plan Note (Signed)
Tried to get Dr Marylen Ponto office on line and they are closed.  Need to get exact history about CVA.  Does not appear to be embolic although severe calcific AS is a risk.  ? Dr Terrace Arabia neurology has seen.

## 2012-02-25 NOTE — Assessment & Plan Note (Signed)
Discussed low carb diet.  Target hemoglobin A1c is 6.5 or less.  Continue current medications.  

## 2012-02-25 NOTE — Patient Instructions (Addendum)
PT TO CALL NEXT WEEK NEEDS HEART CATH  ASK FOR Towanda Hornstein   [phone] Your physician recommends that you continue on your current medications as directed. Please refer to the Current Medication list given to you today.

## 2012-02-25 NOTE — Telephone Encounter (Signed)
ERROR./CY 

## 2012-02-25 NOTE — Assessment & Plan Note (Signed)
Well controlled.  Continue current medications and low sodium Dash type diet.    

## 2012-02-25 NOTE — Assessment & Plan Note (Signed)
He meets criteria for operative AS with symptoms of exertional dyspnea/  Needs right and left cath R/O CAD given multiple risk factors including DM . Procedure and risks discussed  He will think about it and call us next week to hopefully arrange

## 2012-08-02 HISTORY — PX: PARS PLANA VITRECTOMY: SHX2166

## 2012-08-23 ENCOUNTER — Encounter (HOSPITAL_COMMUNITY): Payer: Self-pay

## 2012-08-23 ENCOUNTER — Emergency Department (HOSPITAL_COMMUNITY)
Admission: EM | Admit: 2012-08-23 | Discharge: 2012-08-23 | Disposition: A | Discharge status: Home / Self Care | Payer: Medicare Other | Attending: Emergency Medicine | Admitting: Emergency Medicine

## 2012-08-23 DIAGNOSIS — E162 Hypoglycemia, unspecified: Secondary | ICD-10-CM

## 2012-08-23 DIAGNOSIS — Z79899 Other long term (current) drug therapy: Secondary | ICD-10-CM | POA: Insufficient documentation

## 2012-08-23 DIAGNOSIS — R4182 Altered mental status, unspecified: Secondary | ICD-10-CM | POA: Insufficient documentation

## 2012-08-23 DIAGNOSIS — Z87891 Personal history of nicotine dependence: Secondary | ICD-10-CM | POA: Insufficient documentation

## 2012-08-23 DIAGNOSIS — E1169 Type 2 diabetes mellitus with other specified complication: Secondary | ICD-10-CM | POA: Insufficient documentation

## 2012-08-23 DIAGNOSIS — I10 Essential (primary) hypertension: Secondary | ICD-10-CM | POA: Insufficient documentation

## 2012-08-23 DIAGNOSIS — Z8739 Personal history of other diseases of the musculoskeletal system and connective tissue: Secondary | ICD-10-CM | POA: Insufficient documentation

## 2012-08-23 DIAGNOSIS — Z8669 Personal history of other diseases of the nervous system and sense organs: Secondary | ICD-10-CM | POA: Insufficient documentation

## 2012-08-23 DIAGNOSIS — Z794 Long term (current) use of insulin: Secondary | ICD-10-CM | POA: Insufficient documentation

## 2012-08-23 DIAGNOSIS — Z8673 Personal history of transient ischemic attack (TIA), and cerebral infarction without residual deficits: Secondary | ICD-10-CM | POA: Insufficient documentation

## 2012-08-23 LAB — GLUCOSE, CAPILLARY
Glucose-Capillary: 66 mg/dL — ABNORMAL LOW (ref 70–99)
Glucose-Capillary: 107 mg/dL — ABNORMAL HIGH (ref 70–99)

## 2012-08-23 LAB — BASIC METABOLIC PANEL
BUN: 12 mg/dL (ref 6–23)
Calcium: 10 mg/dL (ref 8.4–10.5)
Creatinine, Ser: 1.37 mg/dL — ABNORMAL HIGH (ref 0.50–1.35)
GFR calc Af Amer: 56 mL/min — ABNORMAL LOW (ref >90)
GFR calc non Af Amer: 48 mL/min — ABNORMAL LOW (ref >90)

## 2012-08-23 NOTE — ED Notes (Signed)
Patient became confused and agitated. Patient's wife brought patient in because she did not have any glucose tablets with her. Patient oriented at this time CBG-66.

## 2012-08-23 NOTE — ED Notes (Signed)
Pt escorted to discharge window. Pt verbalized understanding discharge instructions. In no acute distress.  

## 2012-08-23 NOTE — ED Notes (Signed)
Per md pt given sandwich and orange juice

## 2012-08-23 NOTE — ED Provider Notes (Addendum)
History     CSN: [id number]  Arrival date & time 08/23/12  1552   First MD Initiated Contact with Patient 08/23/12 1613      Chief Complaint  Patient presents with  . Hypoglycemia    (Consider location/radiation/quality/duration/timing/severity/associated sxs/prior treatment) Patient is a 79 y.o. male presenting with altered mental status. The history is provided by the spouse (the pt was confused today.  her wife gave him something to eat and he improved.  pt missed eating lunch today and still took his insulin). No language interpreter was used.  Altered Mental Status This is a new problem. The current episode started less than 1 hour ago. The problem occurs rarely. The problem has been resolved. Pertinent negatives include no chest pain, no abdominal pain and no headaches. Nothing aggravates the symptoms. Nothing relieves the symptoms.    Past Medical History  Diagnosis Date  . HTN (hypertension)   . Diabetes mellitus   . Paresthesia   . Macular degeneration   . CVA (cerebral infarction)   . Ptosis   . Gait disturbance     Past Surgical History  Procedure Laterality Date  . Prostate surgery    . Eye surgery      History reviewed. No pertinent family history.  History  Substance Use Topics  . Smoking status: Former Games developer  . Smokeless tobacco: Never Used  . Alcohol Use: No      Review of Systems  Constitutional: Negative for fatigue.  HENT: Negative for congestion, sinus pressure and ear discharge.   Eyes: Negative for discharge.  Respiratory: Negative for cough.   Cardiovascular: Negative for chest pain.  Gastrointestinal: Negative for abdominal pain and diarrhea.  Genitourinary: Negative for frequency and hematuria.  Musculoskeletal: Negative for back pain.  Skin: Negative for rash.  Neurological: Negative for seizures and headaches.  Psychiatric/Behavioral: Positive for altered mental status. Negative for hallucinations.    Allergies  Review of  patient's allergies indicates no known allergies.  Home Medications   Current Outpatient Rx  Name  Route  Sig  Dispense  Refill  . allopurinol (ZYLOPRIM) 100 MG tablet   Oral   Take 200 mg by mouth at bedtime.          Marland Kitchen HUMALOG MIX 50/50 (50-50) 100 UNIT/ML SUSP      50 Units 2 (two) times daily before a meal. Sliding scale         . hydrALAZINE (APRESOLINE) 50 MG tablet   Oral   Take 50 mg by mouth at bedtime.          . metFORMIN (GLUCOPHAGE) 1000 MG tablet   Oral   Take 1,000 mg by mouth 2 (two) times daily with a meal.          . methotrexate (RHEUMATREX) 2.5 MG tablet   Oral   Take 10 mg by mouth Once a week. friday         . omeprazole-sodium bicarbonate (ZEGERID) 40-1100 MG per capsule   Oral   Take 1 capsule by mouth every evening.         Marland Kitchen TRIBENZOR 40-10-25 MG TABS   Oral   Take 1 tablet by mouth every morning.          Marland Kitchen ZETIA 10 MG tablet   Oral   Take 1 tablet by mouth every morning.            BP 151/72  Pulse 99  Temp(Src) 98 F (36.7 C) (Oral)  Resp 18  SpO2 95%  Physical Exam  Constitutional: He is oriented to person, place, and time. He appears well-developed.  HENT:  Head: Normocephalic and atraumatic.  Eyes: Conjunctivae and EOM are normal. No scleral icterus.  Neck: Neck supple. No thyromegaly present.  Cardiovascular: Normal rate and regular rhythm.  Exam reveals no gallop and no friction rub.   No murmur heard. Pulmonary/Chest: No stridor. He has no wheezes. He has no rales. He exhibits no tenderness.  Abdominal: He exhibits no distension. There is no tenderness. There is no rebound.  Musculoskeletal: Normal range of motion. He exhibits no edema.  Lymphadenopathy:    He has no cervical adenopathy.  Neurological: He is oriented to person, place, and time. Coordination normal.  Skin: No rash noted. No erythema.  Psychiatric: He has a normal mood and affect. His behavior is normal.    ED Course  Procedures  (including critical care time)  Labs Reviewed  GLUCOSE, CAPILLARY - Abnormal; Notable for the following:    Glucose-Capillary 66 (*)    All other components within normal limits  BASIC METABOLIC PANEL - Abnormal; Notable for the following:    Creatinine, Ser 1.37 (*)    GFR calc non Af Amer 48 (*)    GFR calc Af Amer 56 (*)    All other components within normal limits  GLUCOSE, CAPILLARY - Abnormal; Notable for the following:    Glucose-Capillary 107 (*)    All other components within normal limits   No results found.   1. Hypoglycemia       MDM          Benny Lennert, MD 08/23/12 1802  Benny Lennert, MD 08/23/12 [phone]

## 2012-08-23 NOTE — ED Notes (Signed)
Pt alert and oriented x4. Respirations even and unlabored, bilateral symmetrical rise and fall of chest. Skin warm and dry. In no acute distress. Denies needs.   

## 2012-11-17 ENCOUNTER — Ambulatory Visit (INDEPENDENT_AMBULATORY_CARE_PROVIDER_SITE_OTHER): Payer: BLUE CROSS/BLUE SHIELD | Admitting: Podiatry

## 2012-11-17 ENCOUNTER — Encounter: Payer: Self-pay | Admitting: Podiatry

## 2012-11-17 VITALS — BP 153/76 | HR 86 | Ht 68.0 in | Wt 209.0 lb

## 2012-11-17 DIAGNOSIS — B351 Tinea unguium: Secondary | ICD-10-CM

## 2012-11-17 DIAGNOSIS — M25579 Pain in unspecified ankle and joints of unspecified foot: Secondary | ICD-10-CM

## 2012-11-17 NOTE — Progress Notes (Signed)
Subjective: 79 y.o. year old male patient presents complaining of painful nails. Patient requests toe nails, corns and calluses trimmed. Stated that his HgA1C was good, but does not now exact number.  Patient Summary List & History reviewed for allergies, medications, medical problems and surgical history.  Review of Systems - General ROS: negative for - chills, fatigue, fever, sleep disturbance, weight gain or weight loss Ophthalmic ROS: Left eye surgery for detached retinar and Cataract surgeyr on right. Now has poor vision, can't drive. ENT ROS: negative Allergy and Immunology ROS: negative Endocrine ROS: negative Respiratory ROS: no cough, shortness of breath, or wheezing Cardiovascular ROS: Coronary stenosis and has hart murmur. Gastrointestinal ROS: no abdominal pain, change in bowel habits, or black or bloody stools Genito-Urinary ROS: Had prostate surgery 2 years ago. No new problems. Musculoskeletal ROS: negative Neurological ROS: no TIA or stroke symptoms  Objective: Dermatologic:  Thick dystrophic nails x 10. Ingrown nails on both great toe nails.   Vascular: All pedal pulses are palpable. No edema or erythema noted.  Orthopedic:  Hallux valgus with bunion bilateral.  Neurologic:  Decreased sensory perception to Monofilament sensory testing bilateral.  Assessment: Dystrophic mycotic nails x 10. Painful toes 1 bilateral TYpe II IDDM under control.  Treatment: All mycotic nails debrided.  Return in 3 months or as needed.

## 2013-01-14 ENCOUNTER — Encounter (HOSPITAL_COMMUNITY): Payer: Self-pay

## 2013-01-14 ENCOUNTER — Emergency Department (HOSPITAL_COMMUNITY): Payer: Medicare Other

## 2013-01-14 ENCOUNTER — Emergency Department (HOSPITAL_COMMUNITY)
Admission: EM | Admit: 2013-01-14 | Discharge: 2013-01-14 | Disposition: A | Discharge status: Other Acute Inpatient Hospital | Payer: Medicare Other | Attending: Emergency Medicine | Admitting: Emergency Medicine

## 2013-01-14 DIAGNOSIS — Z87891 Personal history of nicotine dependence: Secondary | ICD-10-CM | POA: Insufficient documentation

## 2013-01-14 DIAGNOSIS — R112 Nausea with vomiting, unspecified: Secondary | ICD-10-CM | POA: Insufficient documentation

## 2013-01-14 DIAGNOSIS — Z8673 Personal history of transient ischemic attack (TIA), and cerebral infarction without residual deficits: Secondary | ICD-10-CM | POA: Insufficient documentation

## 2013-01-14 DIAGNOSIS — R0602 Shortness of breath: Secondary | ICD-10-CM | POA: Insufficient documentation

## 2013-01-14 DIAGNOSIS — R011 Cardiac murmur, unspecified: Secondary | ICD-10-CM | POA: Insufficient documentation

## 2013-01-14 DIAGNOSIS — I509 Heart failure, unspecified: Secondary | ICD-10-CM | POA: Insufficient documentation

## 2013-01-14 DIAGNOSIS — Z8669 Personal history of other diseases of the nervous system and sense organs: Secondary | ICD-10-CM | POA: Insufficient documentation

## 2013-01-14 DIAGNOSIS — I359 Nonrheumatic aortic valve disorder, unspecified: Secondary | ICD-10-CM | POA: Insufficient documentation

## 2013-01-14 DIAGNOSIS — Z794 Long term (current) use of insulin: Secondary | ICD-10-CM | POA: Insufficient documentation

## 2013-01-14 DIAGNOSIS — I350 Nonrheumatic aortic (valve) stenosis: Secondary | ICD-10-CM

## 2013-01-14 DIAGNOSIS — I10 Essential (primary) hypertension: Secondary | ICD-10-CM | POA: Insufficient documentation

## 2013-01-14 DIAGNOSIS — R5381 Other malaise: Secondary | ICD-10-CM | POA: Insufficient documentation

## 2013-01-14 DIAGNOSIS — R1110 Vomiting, unspecified: Secondary | ICD-10-CM

## 2013-01-14 DIAGNOSIS — E119 Type 2 diabetes mellitus without complications: Secondary | ICD-10-CM | POA: Insufficient documentation

## 2013-01-14 DIAGNOSIS — Z79899 Other long term (current) drug therapy: Secondary | ICD-10-CM | POA: Insufficient documentation

## 2013-01-14 HISTORY — DX: Cardiac murmur, unspecified: R01.1

## 2013-01-14 LAB — URINALYSIS, ROUTINE W REFLEX MICROSCOPIC
Glucose, UA: NEGATIVE mg/dL
Leukocytes, UA: NEGATIVE
pH: 5 (ref 5.0–8.0)

## 2013-01-14 LAB — URINE MICROSCOPIC-ADD ON

## 2013-01-14 LAB — POCT I-STAT TROPONIN I: Troponin i, poc: 0.02 ng/mL (ref 0.00–0.08)

## 2013-01-14 LAB — CBC WITH DIFFERENTIAL/PLATELET
Basophils Absolute: 0 10*3/uL (ref 0.0–0.1)
Basophils Relative: 0 % (ref 0–1)
Eosinophils Relative: 1 % (ref 0–5)
HCT: 39.9 % (ref 39.0–52.0)
MCHC: 34.6 g/dL (ref 30.0–36.0)
Monocytes Absolute: 0.6 10*3/uL (ref 0.1–1.0)
Neutro Abs: 7.3 10*3/uL (ref 1.7–7.7)
RDW: 14.9 % (ref 11.5–15.5)

## 2013-01-14 LAB — COMPREHENSIVE METABOLIC PANEL
AST: 76 U/L — ABNORMAL HIGH (ref 0–37)
Albumin: 4.1 g/dL (ref 3.5–5.2)
Calcium: 10.9 mg/dL — ABNORMAL HIGH (ref 8.4–10.5)
Chloride: 100 mEq/L (ref 96–112)
Creatinine, Ser: 1.46 mg/dL — ABNORMAL HIGH (ref 0.50–1.35)

## 2013-01-14 MED ORDER — ONDANSETRON HCL 4 MG/2ML IJ SOLN
4.0000 mg | Freq: Once | INTRAMUSCULAR | Status: AC
  Administered 2013-01-14: 4 mg via INTRAVENOUS
  Filled 2013-01-14: qty 2

## 2013-01-14 NOTE — ED Provider Notes (Signed)
Medical screening examination/treatment/procedure(s) were conducted as a shared visit with non-physician practitioner(s) and myself.  I personally evaluated the patient during the encounter  Doug Sou, MD 01/14/13 1643

## 2013-01-14 NOTE — ED Notes (Signed)
Patient states he began having N/V yesterday and vomited x 3. Patient's wife reports that the patient has an appointment with a cardiologist at [facility] and is to have a valve replacement. Patient also c/o SOB and weakness greater than usual.

## 2013-01-14 NOTE — ED Provider Notes (Signed)
History    CSN: [id number] Arrival date & time 01/14/13  1405  First MD Initiated Contact with Patient 01/14/13 1415     Chief Complaint  Patient presents with  . Emesis  . Weakness   (Consider location/radiation/quality/duration/timing/severity/associated sxs/prior Treatment) HPI Comments: Patient with h/o IDDM currently being worked up for heart valve replacement at [facility] presents with 2 days of vomiting, worse today (3 episodes today) as well as nausea and shortness of breath with activity that is worse than normal. Patient denies any fever, chest pain, or abdominal pain. Vomitus is nonbilious, nonbloody. Patient is having normal bowel movements. No urinary symptoms. The onset of this condition was acute. The course is constant. Aggravating factors: none. Alleviating factors: none.   Per [facility] records: Echo dated 8/13 showed severe AS with DI of 0.24, valve area of 0.7-0.9 and peak/mean grad of 72.8 and 39.1 mmHg.     The history is provided by the patient.   Past Medical History  Diagnosis Date  . HTN (hypertension)   . Diabetes mellitus   . Paresthesia   . Macular degeneration   . CVA (cerebral infarction)   . Ptosis   . Gait disturbance   . Heart murmur    Past Surgical History  Procedure Laterality Date  . Prostate surgery    . Eye surgery     History reviewed. No pertinent family history. History  Substance Use Topics  . Smoking status: Former Games developer  . Smokeless tobacco: Never Used  . Alcohol Use: No    Review of Systems  Constitutional: Negative for fever.  HENT: Negative for sore throat and rhinorrhea.   Eyes: Negative for redness.  Respiratory: Positive for shortness of breath. Negative for cough.   Cardiovascular: Negative for chest pain.  Gastrointestinal: Positive for vomiting. Negative for nausea, abdominal pain and diarrhea.  Genitourinary: Negative for dysuria.  Musculoskeletal: Negative for myalgias.  Skin:  Negative for rash.  Neurological: Negative for headaches.    Allergies  Review of patient's allergies indicates no known allergies.  Home Medications   Current Outpatient Rx  Name  Route  Sig  Dispense  Refill  . allopurinol (ZYLOPRIM) 100 MG tablet   Oral   Take 200 mg by mouth at bedtime.          Marland Kitchen HUMALOG MIX 50/50 (50-50) 100 UNIT/ML SUSP      50 Units 2 (two) times daily before a meal. Sliding scale         . hydrALAZINE (APRESOLINE) 50 MG tablet   Oral   Take 50 mg by mouth 2 (two) times daily.          . metFORMIN (GLUCOPHAGE) 1000 MG tablet   Oral   Take 1,000 mg by mouth 2 (two) times daily with a meal.          . methotrexate (RHEUMATREX) 2.5 MG tablet   Oral   Take 10 mg by mouth Once a week. 4 tabs on fridays         . omeprazole-sodium bicarbonate (ZEGERID) 40-1100 MG per capsule   Oral   Take 1 capsule by mouth every evening.         Marland Kitchen TRIBENZOR 40-10-25 MG TABS   Oral   Take 1 tablet by mouth every morning.          Marland Kitchen ZETIA 10 MG tablet   Oral   Take 1 tablet by mouth every morning.  BP 180/92  Pulse 104  Temp(Src) 98.3 F (36.8 C)  Resp 22  Ht 5\' 8"  (1.727 m)  Wt 214 lb (97.07 kg)  BMI 32.55 kg/m2  SpO2 98% Physical Exam  Nursing note and vitals reviewed. Constitutional: He appears well-developed and well-nourished.  HENT:  Head: Normocephalic and atraumatic.  Mouth/Throat: Mucous membranes are normal. Mucous membranes are not dry.  Eyes: Conjunctivae are normal. Right eye exhibits no discharge. Left eye exhibits no discharge.  Neck: Trachea normal and normal range of motion. Neck supple. Normal carotid pulses and no JVD present. No muscular tenderness present. Carotid bruit is not present. No tracheal deviation present.  Cardiovascular: Normal rate, regular rhythm, S1 normal, S2 normal and intact distal pulses.  Exam reveals no distant heart sounds and no decreased pulses.   Murmur (4/6 SEM heard over precordium,  best LSB) heard. Pulmonary/Chest: Effort normal and breath sounds normal. No respiratory distress. He has no wheezes. He exhibits no tenderness.  Abdominal: Soft. Normal aorta and bowel sounds are normal. There is no tenderness. There is no rebound and no guarding.  Musculoskeletal: He exhibits no edema.  Neurological: He is alert.  Skin: Skin is warm and dry. He is not diaphoretic. No cyanosis. No pallor.  Psychiatric: He has a normal mood and affect.    ED Course  Procedures (including critical care time) Labs Reviewed  COMPREHENSIVE METABOLIC PANEL - Abnormal; Notable for the following:    Glucose, Bld 201 (*)    Creatinine, Ser 1.46 (*)    Calcium 10.9 (*)    AST 76 (*)    ALT 82 (*)    GFR calc non Af Amer 44 (*)    GFR calc Af Amer 51 (*)    All other components within normal limits  LIPASE, BLOOD - Abnormal; Notable for the following:    Lipase 61 (*)    All other components within normal limits  URINALYSIS, ROUTINE W REFLEX MICROSCOPIC - Abnormal; Notable for the following:    APPearance CLOUDY (*)    Protein, ur 100 (*)    All other components within normal limits  CBC WITH DIFFERENTIAL  URINE MICROSCOPIC-ADD ON  POCT I-STAT TROPONIN I   Dg Chest 2 View  01/14/2013   *RADIOLOGY REPORT*  Clinical Data: Emesis and weakness  CHEST - 2 VIEW  Comparison: 09/07/2010  Findings: The heart size and mediastinal contours are within normal limits.  Both lungs are clear.  The visualized skeletal structures are remarkable for mild thoracic spondylosis.  IMPRESSION: No acute cardiopulmonary abnormalities.   Original Report Authenticated By: Signa Kell, M.D.   1. Shortness of breath   2. Vomiting   3. Aortic stenosis     2:27 PM Patient seen and examined. Work-up initiated. Medications ordered.   Vital signs reviewed and are as follows: Filed Vitals:   01/14/13 1416  BP: 180/92  Pulse: 104  Temp: 98.3 F (36.8 C)  Resp: 22     Date: 01/14/2013  Rate: 100  Rhythm:  normal sinus rhythm  QRS Axis: Left axis deviation  Intervals: normal  ST/T Wave abnormalities: nonspecific T wave changes  Conduction Disutrbances:none  Narrative Interpretation:   Old EKG Reviewed: unchanged from 09/25/2010  3:24 PM Patient d/w and seen by Dr. Ethelda Chick. Will call [facility] and ask that patient be transferred there for further eval and mgmt. Concern is that SOB and subjective sx can be indicative of worsening/critical aortic stenosis.   Accepting MD Ambrose Mantle.   MDM  Tx  to [facility] for further eval per above.       Renne Crigler, PA-C 01/14/13 1641  Renne Crigler, PA-C 01/14/13 1642

## 2013-01-14 NOTE — ED Provider Notes (Signed)
Patient experiencing generalized weakness, dyspnea on exertion. And vomiting since yesterday. Denies chest pain denies abdominal pain denies headache. Exam no distress lungs auscultation heart regular in rhythm a grade 4/6 systolic ejection murmur. Concern for anginal equivalent.  Doug Sou, MD 01/14/13 1534

## 2013-01-14 NOTE — ED Notes (Signed)
Report called to the Floor at [facility] - the patient transported via carelink to [id number]

## 2013-02-16 ENCOUNTER — Ambulatory Visit (INDEPENDENT_AMBULATORY_CARE_PROVIDER_SITE_OTHER): Payer: BLUE CROSS/BLUE SHIELD | Admitting: Podiatry

## 2013-02-16 DIAGNOSIS — B351 Tinea unguium: Secondary | ICD-10-CM

## 2013-02-16 DIAGNOSIS — M25579 Pain in unspecified ankle and joints of unspecified foot: Secondary | ICD-10-CM

## 2013-02-16 DIAGNOSIS — E119 Type 2 diabetes mellitus without complications: Secondary | ICD-10-CM

## 2013-02-16 NOTE — Progress Notes (Signed)
Subjective:  79 y.o. year old male patient presents complaining of painful nails. Patient requests toe nails, corns and calluses trimmed. Stated that his HgA1C was good, but does not now exact number.  Objective: Dermatologic:  Thick dystrophic nails x 10.  Ingrown nails on both great toe nails.  Vascular:  All pedal pulses are palpable.  No edema or erythema noted.  Orthopedic:  Hallux valgus with bunion bilateral.  Neurologic:  Decreased sensory perception to Monofilament sensory testing bilateral.  Assessment:  Dystrophic mycotic nails x 10.  TYpe II IDDM under control.  Treatment: All mycotic nails debrided.  Return in 3 months or as needed.

## 2013-03-26 HISTORY — PX: CORONARY ARTERY BYPASS GRAFT: SHX141

## 2013-03-26 HISTORY — PX: AORTIC VALVE REPLACEMENT: SHX41

## 2013-04-07 DIAGNOSIS — R0902 Hypoxemia: Secondary | ICD-10-CM | POA: Insufficient documentation

## 2013-05-18 ENCOUNTER — Encounter: Payer: Self-pay | Admitting: Podiatry

## 2013-05-18 ENCOUNTER — Ambulatory Visit: Payer: BLUE CROSS/BLUE SHIELD | Admitting: Podiatry

## 2013-05-18 ENCOUNTER — Ambulatory Visit (INDEPENDENT_AMBULATORY_CARE_PROVIDER_SITE_OTHER): Payer: BLUE CROSS/BLUE SHIELD | Admitting: Podiatry

## 2013-05-18 VITALS — BP 150/73 | HR 73 | Ht 68.0 in | Wt 186.0 lb

## 2013-05-18 DIAGNOSIS — E119 Type 2 diabetes mellitus without complications: Secondary | ICD-10-CM

## 2013-05-18 DIAGNOSIS — M25579 Pain in unspecified ankle and joints of unspecified foot: Secondary | ICD-10-CM

## 2013-05-18 DIAGNOSIS — B351 Tinea unguium: Secondary | ICD-10-CM

## 2013-05-18 NOTE — Patient Instructions (Signed)
Seen for hypertrophic ingrown nails. All nails debrided. Return in 3 month or as needed.

## 2013-05-18 NOTE — Progress Notes (Signed)
Subjective:  79 y.o. year old male patient presents requesting toe nails trimmed. He was recently underwent heart surgery and doing well.  Objective: Dermatologic:  Thick dystrophic nails x 10.  Ingrown nails on both great toe nails.  Vascular:  All pedal pulses are palpable.  No edema or erythema noted.  Orthopedic:  Hallux valgus with bunion bilateral.  Neurologic:  Decreased sensory perception to Monofilament sensory testing bilateral.   Assessment:  Dystrophic mycotic nails x 10.  TYpe II IDDM under control.  Treatment: All mycotic nails debrided.  Return in 3 months or as needed.

## 2013-06-14 ENCOUNTER — Telehealth: Payer: Self-pay | Admitting: Cardiology

## 2013-06-14 NOTE — Telephone Encounter (Signed)
Called and left message for Hilda Lias to just refax to [place]

## 2013-06-14 NOTE — Telephone Encounter (Signed)
New message  Please call when you get a chance, nothing urgent. Please advise

## 2013-06-21 ENCOUNTER — Encounter (HOSPITAL_COMMUNITY)
Admission: RE | Admit: 2013-06-21 | Discharge: 2013-06-21 | Disposition: A | Discharge status: Home / Self Care | Payer: Medicare Other | Source: Ambulatory Visit | Attending: Cardiology | Admitting: Cardiology

## 2013-06-21 DIAGNOSIS — I517 Cardiomegaly: Secondary | ICD-10-CM | POA: Insufficient documentation

## 2013-06-21 DIAGNOSIS — N183 Chronic kidney disease, stage 3 unspecified: Secondary | ICD-10-CM | POA: Insufficient documentation

## 2013-06-21 DIAGNOSIS — I129 Hypertensive chronic kidney disease with stage 1 through stage 4 chronic kidney disease, or unspecified chronic kidney disease: Secondary | ICD-10-CM | POA: Insufficient documentation

## 2013-06-21 DIAGNOSIS — Z8673 Personal history of transient ischemic attack (TIA), and cerebral infarction without residual deficits: Secondary | ICD-10-CM | POA: Insufficient documentation

## 2013-06-21 DIAGNOSIS — E119 Type 2 diabetes mellitus without complications: Secondary | ICD-10-CM | POA: Insufficient documentation

## 2013-06-21 DIAGNOSIS — Z87891 Personal history of nicotine dependence: Secondary | ICD-10-CM | POA: Insufficient documentation

## 2013-06-21 DIAGNOSIS — I059 Rheumatic mitral valve disease, unspecified: Secondary | ICD-10-CM | POA: Insufficient documentation

## 2013-06-21 DIAGNOSIS — Z79899 Other long term (current) drug therapy: Secondary | ICD-10-CM | POA: Insufficient documentation

## 2013-06-21 DIAGNOSIS — R011 Cardiac murmur, unspecified: Secondary | ICD-10-CM | POA: Insufficient documentation

## 2013-06-21 DIAGNOSIS — I359 Nonrheumatic aortic valve disorder, unspecified: Secondary | ICD-10-CM | POA: Insufficient documentation

## 2013-06-21 DIAGNOSIS — Z5189 Encounter for other specified aftercare: Secondary | ICD-10-CM | POA: Insufficient documentation

## 2013-06-21 NOTE — Progress Notes (Signed)
Cardiac Rehab Medication Review by a Pharmacist  Does the patient  feel that his/her medications are working for him/her?  yes  Has the patient been experiencing any side effects to the medications prescribed?  no  Does the patient measure his/her own blood pressure or blood glucose at home?  sometimes   Does the patient have any problems obtaining medications due to transportation or finances?   no  Understanding of regimen: excellent Understanding of indications: good Potential of compliance: excellent    Pharmacist comments: wife is very involved in care. Has an app reminder on her phone to help with medications. Great complaince    Johnny [name] 06/21/2013 8:51 AM

## 2013-06-25 ENCOUNTER — Encounter (HOSPITAL_COMMUNITY): Payer: Self-pay

## 2013-06-25 ENCOUNTER — Encounter (HOSPITAL_COMMUNITY)
Admission: RE | Admit: 2013-06-25 | Discharge: 2013-06-25 | Disposition: A | Discharge status: Home / Self Care | Payer: Medicare Other | Source: Ambulatory Visit | Attending: Cardiology | Admitting: Cardiology

## 2013-06-25 LAB — GLUCOSE, CAPILLARY: Glucose-Capillary: 248 mg/dL — ABNORMAL HIGH (ref 70–99)

## 2013-06-25 NOTE — Progress Notes (Signed)
Pt started cardiac rehab today.  Pt tolerated light exercise without difficulty.  VSS, telemetry-nsr.  Asymptomatic. Pt visually impaired, pt wife prepares his medication for him.  Med list reconciled with pt wife.  Pt oriented to exercise equipment and routine, accommodations made for visual impairment. Understanding verbalized.

## 2013-06-27 ENCOUNTER — Encounter (HOSPITAL_COMMUNITY)
Admission: RE | Admit: 2013-06-27 | Discharge: 2013-06-27 | Disposition: A | Discharge status: Home / Self Care | Payer: Medicare Other | Source: Ambulatory Visit | Attending: Cardiology | Admitting: Cardiology

## 2013-06-28 LAB — GLUCOSE, CAPILLARY: Glucose-Capillary: 227 mg/dL — ABNORMAL HIGH (ref 70–99)

## 2013-06-29 ENCOUNTER — Encounter (HOSPITAL_COMMUNITY): Payer: Medicare Other

## 2013-07-02 ENCOUNTER — Telehealth (HOSPITAL_COMMUNITY): Payer: Self-pay | Admitting: *Deleted

## 2013-07-02 ENCOUNTER — Encounter (HOSPITAL_COMMUNITY): Admission: RE | Admit: 2013-07-02 | Payer: Medicare Other | Source: Ambulatory Visit

## 2013-07-03 ENCOUNTER — Telehealth (HOSPITAL_COMMUNITY): Payer: Self-pay | Admitting: Cardiac Rehabilitation

## 2013-07-03 NOTE — Telephone Encounter (Signed)
pc to assess reason for continued absence from cardiac rehab. Pt states he has not felt well,  But is starting to improve. Pt advised dept open 07/04/13 and closed 07/06/13. Understanding verbalized

## 2013-07-04 ENCOUNTER — Encounter (HOSPITAL_COMMUNITY): Payer: Medicare Other

## 2013-07-09 ENCOUNTER — Encounter (HOSPITAL_COMMUNITY)
Admission: RE | Admit: 2013-07-09 | Discharge: 2013-07-09 | Disposition: A | Discharge status: Home / Self Care | Payer: Medicare Other | Source: Ambulatory Visit | Attending: Cardiology | Admitting: Cardiology

## 2013-07-09 LAB — GLUCOSE, CAPILLARY: Glucose-Capillary: 257 mg/dL — ABNORMAL HIGH (ref 70–99)

## 2013-07-11 ENCOUNTER — Encounter (HOSPITAL_COMMUNITY)
Admission: RE | Admit: 2013-07-11 | Discharge: 2013-07-11 | Disposition: A | Discharge status: Home / Self Care | Payer: Medicare Other | Source: Ambulatory Visit | Attending: Cardiology | Admitting: Cardiology

## 2013-07-13 ENCOUNTER — Encounter (HOSPITAL_COMMUNITY)
Admission: RE | Admit: 2013-07-13 | Discharge: 2013-07-13 | Disposition: A | Discharge status: Home / Self Care | Payer: Medicare Other | Source: Ambulatory Visit | Attending: Cardiology | Admitting: Cardiology

## 2013-07-13 DIAGNOSIS — I359 Nonrheumatic aortic valve disorder, unspecified: Secondary | ICD-10-CM | POA: Insufficient documentation

## 2013-07-13 DIAGNOSIS — N183 Chronic kidney disease, stage 3 unspecified: Secondary | ICD-10-CM | POA: Insufficient documentation

## 2013-07-13 DIAGNOSIS — I517 Cardiomegaly: Secondary | ICD-10-CM | POA: Insufficient documentation

## 2013-07-13 DIAGNOSIS — Z5189 Encounter for other specified aftercare: Secondary | ICD-10-CM | POA: Insufficient documentation

## 2013-07-13 DIAGNOSIS — Z87891 Personal history of nicotine dependence: Secondary | ICD-10-CM | POA: Insufficient documentation

## 2013-07-13 DIAGNOSIS — I129 Hypertensive chronic kidney disease with stage 1 through stage 4 chronic kidney disease, or unspecified chronic kidney disease: Secondary | ICD-10-CM | POA: Insufficient documentation

## 2013-07-13 DIAGNOSIS — Z79899 Other long term (current) drug therapy: Secondary | ICD-10-CM | POA: Insufficient documentation

## 2013-07-13 DIAGNOSIS — Z8673 Personal history of transient ischemic attack (TIA), and cerebral infarction without residual deficits: Secondary | ICD-10-CM | POA: Insufficient documentation

## 2013-07-13 DIAGNOSIS — R011 Cardiac murmur, unspecified: Secondary | ICD-10-CM | POA: Insufficient documentation

## 2013-07-13 DIAGNOSIS — E119 Type 2 diabetes mellitus without complications: Secondary | ICD-10-CM | POA: Insufficient documentation

## 2013-07-13 DIAGNOSIS — I059 Rheumatic mitral valve disease, unspecified: Secondary | ICD-10-CM | POA: Insufficient documentation

## 2013-07-13 LAB — GLUCOSE, CAPILLARY: GLUCOSE-CAPILLARY: 233 mg/dL — AB (ref 70–99)

## 2013-07-16 ENCOUNTER — Encounter (HOSPITAL_COMMUNITY)
Admission: RE | Admit: 2013-07-16 | Discharge: 2013-07-16 | Disposition: A | Discharge status: Home / Self Care | Payer: Medicare Other | Source: Ambulatory Visit | Attending: Cardiology | Admitting: Cardiology

## 2013-07-16 LAB — GLUCOSE, CAPILLARY: Glucose-Capillary: 299 mg/dL — ABNORMAL HIGH (ref 70–99)

## 2013-07-18 ENCOUNTER — Encounter (HOSPITAL_COMMUNITY)
Admission: RE | Admit: 2013-07-18 | Discharge: 2013-07-18 | Disposition: A | Discharge status: Home / Self Care | Payer: Medicare Other | Source: Ambulatory Visit | Attending: Cardiology | Admitting: Cardiology

## 2013-07-18 LAB — GLUCOSE, CAPILLARY: GLUCOSE-CAPILLARY: 276 mg/dL — AB (ref 70–99)

## 2013-07-20 ENCOUNTER — Ambulatory Visit (HOSPITAL_COMMUNITY): Payer: Medicare Other

## 2013-07-20 ENCOUNTER — Encounter (HOSPITAL_COMMUNITY)
Admission: RE | Admit: 2013-07-20 | Discharge: 2013-07-20 | Disposition: A | Discharge status: Home / Self Care | Payer: Medicare Other | Source: Ambulatory Visit | Attending: Cardiology | Admitting: Cardiology

## 2013-07-20 LAB — GLUCOSE, CAPILLARY: Glucose-Capillary: 226 mg/dL — ABNORMAL HIGH (ref 70–99)

## 2013-07-23 ENCOUNTER — Encounter (HOSPITAL_COMMUNITY)
Admission: RE | Admit: 2013-07-23 | Discharge: 2013-07-23 | Disposition: A | Discharge status: Home / Self Care | Payer: Medicare Other | Source: Ambulatory Visit | Attending: Cardiology | Admitting: Cardiology

## 2013-07-23 LAB — GLUCOSE, CAPILLARY: GLUCOSE-CAPILLARY: 255 mg/dL — AB (ref 70–99)

## 2013-07-25 ENCOUNTER — Encounter (HOSPITAL_COMMUNITY): Payer: Medicare Other

## 2013-07-25 ENCOUNTER — Telehealth (HOSPITAL_COMMUNITY): Payer: Self-pay | Admitting: Endocrinology

## 2013-07-27 ENCOUNTER — Encounter (HOSPITAL_COMMUNITY)
Admission: RE | Admit: 2013-07-27 | Discharge: 2013-07-27 | Disposition: A | Discharge status: Home / Self Care | Payer: Medicare Other | Source: Ambulatory Visit | Attending: Cardiology | Admitting: Cardiology

## 2013-07-27 LAB — GLUCOSE, CAPILLARY: Glucose-Capillary: 217 mg/dL — ABNORMAL HIGH (ref 70–99)

## 2013-07-27 NOTE — Progress Notes (Signed)
Pt wife reports today his Lantus was increased to 44units qhs by Dr. Juleen ChinaKohut.  Medication list reconciled.

## 2013-07-30 ENCOUNTER — Encounter (HOSPITAL_COMMUNITY)
Admission: RE | Admit: 2013-07-30 | Discharge: 2013-07-30 | Disposition: A | Discharge status: Home / Self Care | Payer: Medicare Other | Source: Ambulatory Visit | Attending: Cardiology | Admitting: Cardiology

## 2013-07-30 LAB — GLUCOSE, CAPILLARY: Glucose-Capillary: 193 mg/dL — ABNORMAL HIGH (ref 70–99)

## 2013-07-30 NOTE — Progress Notes (Signed)
Johnny [name] 80 y.o. male Nutrition Note Spoke with pt and pt's wife. Nutrition Plan and Nutrition Survey goals reviewed with pt. Pt is following Step 1 of the Therapeutic Lifestyle Changes diet. Pt is diabetic. No recent A1c noted. This Probation officer went over Diabetes Education test results. Pt checks CBG's BID (fasting and before bed). Pt's CBG's have been erratic according to pt's wife and have varied from 97-257 mg/dL fasting. Dr. Wilson Singer increased pt's Lantus from 34 units to 44 units at bedtime 07/26/13 per pt. Pt and wife feel CBG's are starting to improve. Pt expressed understanding of the information reviewed. Pt aware of nutrition education classes offered.  Nutrition Diagnosis   Food-and nutrition-related knowledge deficit related to lack of exposure to information as related to diagnosis of: ? CVD ? DM   Nutrition RX/ Estimated Daily Nutrition Needs for: wt maintenance 2200-2500 Kcal, 70-85 gm fat, 14-17 gm sat fat, 2.2-2.5 gm trans-fat, <1500 mg sodium, 325 gm CHO   Nutrition Intervention   Pt's individual nutrition plan reviewed with pt and pt's wife   Benefits of adopting Therapeutic Lifestyle Changes discussed when Medficts reviewed.   Pt to attend the Portion Distortion class - met 07/11/13   Pt to attend the Diabetes Q & A class - met 07/20/13   Pt given handouts for: ? Nutrition I class ? Nutrition II class ? Diabetes Q & A class   Continue client-centered nutrition education by RD, as part of interdisciplinary care. Goal(s)   Pt to describe the benefit of including fruits, vegetables, whole grains, and low-fat dairy products in a heart healthy meal plan.   CBG concentrations in the normal range or as close to normal as is safely possible. Monitor and Evaluate progress toward nutrition goal with team. Nutrition Risk: Change to Moderate Johnny [name], M.Ed, RD, LDN, CDE 07/30/2013 2:26 PM

## 2013-08-01 ENCOUNTER — Encounter (HOSPITAL_COMMUNITY): Payer: Medicare Other

## 2013-08-01 ENCOUNTER — Telehealth (HOSPITAL_COMMUNITY): Payer: Self-pay | Admitting: Endocrinology

## 2013-08-01 DIAGNOSIS — H348392 Tributary (branch) retinal vein occlusion, unspecified eye, stable: Secondary | ICD-10-CM | POA: Insufficient documentation

## 2013-08-01 HISTORY — DX: Tributary (branch) retinal vein occlusion, unspecified eye, stable: H34.8392

## 2013-08-03 ENCOUNTER — Telehealth (HOSPITAL_COMMUNITY): Payer: Self-pay | Admitting: Endocrinology

## 2013-08-03 ENCOUNTER — Encounter (HOSPITAL_COMMUNITY): Payer: Medicare Other

## 2013-08-06 ENCOUNTER — Encounter (HOSPITAL_COMMUNITY): Payer: Medicare Other

## 2013-08-06 ENCOUNTER — Telehealth (HOSPITAL_COMMUNITY): Payer: Self-pay | Admitting: Endocrinology

## 2013-08-06 DIAGNOSIS — H25819 Combined forms of age-related cataract, unspecified eye: Secondary | ICD-10-CM | POA: Insufficient documentation

## 2013-08-07 ENCOUNTER — Telehealth (HOSPITAL_COMMUNITY): Payer: Self-pay | Admitting: Cardiac Rehabilitation

## 2013-08-07 NOTE — Telephone Encounter (Signed)
pc received from pt wife stating pt will be absent from cardiac rehab due to eye appointment on 08/06/13

## 2013-08-08 ENCOUNTER — Encounter (HOSPITAL_COMMUNITY)
Admission: RE | Admit: 2013-08-08 | Discharge: 2013-08-08 | Disposition: A | Discharge status: Home / Self Care | Payer: Medicare Other | Source: Ambulatory Visit | Attending: Cardiology | Admitting: Cardiology

## 2013-08-08 LAB — GLUCOSE, CAPILLARY: Glucose-Capillary: 175 mg/dL — ABNORMAL HIGH (ref 70–99)

## 2013-08-10 ENCOUNTER — Encounter (HOSPITAL_COMMUNITY)
Admission: RE | Admit: 2013-08-10 | Discharge: 2013-08-10 | Disposition: A | Discharge status: Home / Self Care | Payer: Medicare Other | Source: Ambulatory Visit | Attending: Cardiology | Admitting: Cardiology

## 2013-08-10 LAB — GLUCOSE, CAPILLARY: Glucose-Capillary: 184 mg/dL — ABNORMAL HIGH (ref 70–99)

## 2013-08-13 ENCOUNTER — Encounter (HOSPITAL_COMMUNITY): Payer: Medicare Other

## 2013-08-13 ENCOUNTER — Telehealth (HOSPITAL_COMMUNITY): Payer: Self-pay | Admitting: Endocrinology

## 2013-08-15 ENCOUNTER — Telehealth (HOSPITAL_COMMUNITY): Payer: Self-pay | Admitting: Endocrinology

## 2013-08-15 ENCOUNTER — Telehealth (HOSPITAL_COMMUNITY): Payer: Self-pay | Admitting: Cardiac Rehabilitation

## 2013-08-15 ENCOUNTER — Encounter (HOSPITAL_COMMUNITY): Admission: RE | Admit: 2013-08-15 | Payer: Medicare Other | Source: Ambulatory Visit

## 2013-08-15 NOTE — Telephone Encounter (Signed)
pc to pt to assess absence from cardiac rehab.  Pt c/o increased visual disturbance. Pt states he has cataract, surgery pending.  Pt will call and update when this is scheduled.

## 2013-08-17 ENCOUNTER — Encounter (HOSPITAL_COMMUNITY): Payer: Medicare Other

## 2013-08-17 ENCOUNTER — Ambulatory Visit: Payer: BLUE CROSS/BLUE SHIELD | Admitting: Podiatry

## 2013-08-20 ENCOUNTER — Encounter (HOSPITAL_COMMUNITY): Payer: Medicare Other

## 2013-08-22 ENCOUNTER — Encounter (HOSPITAL_COMMUNITY): Payer: Medicare Other

## 2013-08-24 ENCOUNTER — Encounter (HOSPITAL_COMMUNITY): Payer: Medicare Other

## 2013-08-27 ENCOUNTER — Encounter (HOSPITAL_COMMUNITY): Payer: Medicare Other

## 2013-08-29 ENCOUNTER — Encounter (HOSPITAL_COMMUNITY): Payer: Medicare Other

## 2013-08-31 ENCOUNTER — Encounter (HOSPITAL_COMMUNITY): Payer: Medicare Other

## 2013-09-03 ENCOUNTER — Encounter (HOSPITAL_COMMUNITY): Payer: Medicare Other

## 2013-09-05 ENCOUNTER — Encounter (HOSPITAL_COMMUNITY): Payer: Medicare Other

## 2013-09-07 ENCOUNTER — Encounter (HOSPITAL_COMMUNITY): Payer: Medicare Other

## 2013-09-10 ENCOUNTER — Encounter (HOSPITAL_COMMUNITY): Payer: Medicare Other

## 2013-09-12 ENCOUNTER — Encounter (HOSPITAL_COMMUNITY): Payer: Medicare Other

## 2013-09-14 ENCOUNTER — Encounter (HOSPITAL_COMMUNITY): Admission: RE | Admit: 2013-09-14 | Payer: Medicare Other | Source: Ambulatory Visit

## 2013-09-17 ENCOUNTER — Encounter (HOSPITAL_COMMUNITY): Payer: Medicare Other

## 2013-09-19 ENCOUNTER — Encounter (HOSPITAL_COMMUNITY): Payer: Medicare Other

## 2013-09-21 ENCOUNTER — Encounter (HOSPITAL_COMMUNITY): Payer: Medicare Other

## 2013-09-24 ENCOUNTER — Encounter (HOSPITAL_COMMUNITY): Payer: Medicare Other

## 2013-09-26 ENCOUNTER — Encounter (HOSPITAL_COMMUNITY): Payer: Medicare Other

## 2013-09-28 ENCOUNTER — Encounter (HOSPITAL_COMMUNITY): Payer: Medicare Other

## 2013-10-01 ENCOUNTER — Ambulatory Visit (HOSPITAL_COMMUNITY): Payer: Medicare Other

## 2013-10-03 ENCOUNTER — Ambulatory Visit (HOSPITAL_COMMUNITY): Payer: Medicare Other

## 2013-10-05 ENCOUNTER — Ambulatory Visit (HOSPITAL_COMMUNITY): Payer: Medicare Other

## 2013-10-05 DIAGNOSIS — H26499 Other secondary cataract, unspecified eye: Secondary | ICD-10-CM | POA: Insufficient documentation

## 2013-10-08 ENCOUNTER — Ambulatory Visit (HOSPITAL_COMMUNITY): Payer: Medicare Other

## 2013-10-10 ENCOUNTER — Ambulatory Visit (HOSPITAL_COMMUNITY): Payer: Medicare Other

## 2013-10-12 ENCOUNTER — Ambulatory Visit (HOSPITAL_COMMUNITY): Payer: Medicare Other

## 2013-10-15 ENCOUNTER — Ambulatory Visit (HOSPITAL_COMMUNITY): Payer: Medicare Other

## 2013-10-17 ENCOUNTER — Ambulatory Visit (HOSPITAL_COMMUNITY): Payer: Medicare Other

## 2013-10-19 ENCOUNTER — Ambulatory Visit (HOSPITAL_COMMUNITY): Payer: Medicare Other

## 2013-10-22 ENCOUNTER — Ambulatory Visit (HOSPITAL_COMMUNITY): Payer: Medicare Other

## 2013-10-24 ENCOUNTER — Ambulatory Visit (HOSPITAL_COMMUNITY): Payer: Medicare Other

## 2013-10-26 ENCOUNTER — Ambulatory Visit (HOSPITAL_COMMUNITY): Payer: Medicare Other

## 2013-10-26 ENCOUNTER — Ambulatory Visit (INDEPENDENT_AMBULATORY_CARE_PROVIDER_SITE_OTHER): Payer: BLUE CROSS/BLUE SHIELD | Admitting: Podiatry

## 2013-10-26 ENCOUNTER — Encounter: Payer: Self-pay | Admitting: Podiatry

## 2013-10-26 VITALS — BP 158/72 | HR 69

## 2013-10-26 DIAGNOSIS — B351 Tinea unguium: Secondary | ICD-10-CM

## 2013-10-26 DIAGNOSIS — M79609 Pain in unspecified limb: Secondary | ICD-10-CM

## 2013-10-26 DIAGNOSIS — M79606 Pain in leg, unspecified: Secondary | ICD-10-CM | POA: Insufficient documentation

## 2013-10-26 NOTE — Progress Notes (Signed)
Subjective: 80 y.o. year old male patient presents requesting toe nails trimmed. He is still not able to see well.   Objective: Dermatologic:  Thick dystrophic nails x 10.  Ingrown nails with thick dystrophic nails on both great toe. Vascular:  All pedal pulses are palpable.  No edema or erythema noted.  Orthopedic:  Hallux valgus with bunion bilateral.  Neurologic:  Decreased sensory perception bilateral.   Assessment:  Dystrophic mycotic nails x 10.  TYpe II IDDM under control.   Treatment: All mycotic nails debrided.  Return in 3 months or as needed.

## 2013-10-26 NOTE — Patient Instructions (Signed)
Seen for hypertrophic nails. All nails debrided. Return in 3 months or as needed.  

## 2013-11-01 HISTORY — PX: CATARACT EXTRACTION W/ INTRAOCULAR LENS IMPLANT: SHX1309

## 2013-11-08 HISTORY — PX: PARS PLANA VITRECTOMY: SHX2166

## 2013-11-09 DIAGNOSIS — Z961 Presence of intraocular lens: Secondary | ICD-10-CM | POA: Insufficient documentation

## 2013-12-12 DIAGNOSIS — D631 Anemia in chronic kidney disease: Secondary | ICD-10-CM | POA: Insufficient documentation

## 2013-12-12 DIAGNOSIS — N2581 Secondary hyperparathyroidism of renal origin: Secondary | ICD-10-CM | POA: Insufficient documentation

## 2013-12-12 DIAGNOSIS — N183 Chronic kidney disease, stage 3 unspecified: Secondary | ICD-10-CM | POA: Insufficient documentation

## 2013-12-12 DIAGNOSIS — E559 Vitamin D deficiency, unspecified: Secondary | ICD-10-CM | POA: Insufficient documentation

## 2013-12-12 DIAGNOSIS — E1121 Type 2 diabetes mellitus with diabetic nephropathy: Secondary | ICD-10-CM | POA: Insufficient documentation

## 2013-12-12 DIAGNOSIS — R809 Proteinuria, unspecified: Secondary | ICD-10-CM | POA: Insufficient documentation

## 2013-12-12 DIAGNOSIS — D509 Iron deficiency anemia, unspecified: Secondary | ICD-10-CM | POA: Insufficient documentation

## 2014-01-02 ENCOUNTER — Emergency Department (HOSPITAL_COMMUNITY): Payer: Medicare Other

## 2014-01-02 ENCOUNTER — Encounter (HOSPITAL_COMMUNITY): Payer: Self-pay | Admitting: Emergency Medicine

## 2014-01-02 ENCOUNTER — Inpatient Hospital Stay (HOSPITAL_COMMUNITY)
Admission: EM | Admit: 2014-01-02 | Discharge: 2014-01-05 | DRG: 071 | Disposition: A | Discharge status: Home / Self Care | Payer: Medicare Other | Attending: Internal Medicine | Admitting: Internal Medicine

## 2014-01-02 DIAGNOSIS — N183 Chronic kidney disease, stage 3 unspecified: Secondary | ICD-10-CM | POA: Diagnosis present

## 2014-01-02 DIAGNOSIS — Z87891 Personal history of nicotine dependence: Secondary | ICD-10-CM

## 2014-01-02 DIAGNOSIS — Z954 Presence of other heart-valve replacement: Secondary | ICD-10-CM

## 2014-01-02 DIAGNOSIS — Z888 Allergy status to other drugs, medicaments and biological substances status: Secondary | ICD-10-CM

## 2014-01-02 DIAGNOSIS — Z952 Presence of prosthetic heart valve: Secondary | ICD-10-CM

## 2014-01-02 DIAGNOSIS — H3530 Unspecified macular degeneration: Secondary | ICD-10-CM | POA: Diagnosis present

## 2014-01-02 DIAGNOSIS — Z79899 Other long term (current) drug therapy: Secondary | ICD-10-CM

## 2014-01-02 DIAGNOSIS — I129 Hypertensive chronic kidney disease with stage 1 through stage 4 chronic kidney disease, or unspecified chronic kidney disease: Secondary | ICD-10-CM | POA: Diagnosis present

## 2014-01-02 DIAGNOSIS — D72829 Elevated white blood cell count, unspecified: Secondary | ICD-10-CM | POA: Diagnosis present

## 2014-01-02 DIAGNOSIS — Z8669 Personal history of other diseases of the nervous system and sense organs: Secondary | ICD-10-CM | POA: Diagnosis present

## 2014-01-02 DIAGNOSIS — R011 Cardiac murmur, unspecified: Secondary | ICD-10-CM | POA: Diagnosis present

## 2014-01-02 DIAGNOSIS — R509 Fever, unspecified: Secondary | ICD-10-CM

## 2014-01-02 DIAGNOSIS — Z8673 Personal history of transient ischemic attack (TIA), and cerebral infarction without residual deficits: Secondary | ICD-10-CM

## 2014-01-02 DIAGNOSIS — Z951 Presence of aortocoronary bypass graft: Secondary | ICD-10-CM

## 2014-01-02 DIAGNOSIS — I2510 Atherosclerotic heart disease of native coronary artery without angina pectoris: Secondary | ICD-10-CM

## 2014-01-02 DIAGNOSIS — E119 Type 2 diabetes mellitus without complications: Secondary | ICD-10-CM | POA: Diagnosis present

## 2014-01-02 DIAGNOSIS — N179 Acute kidney failure, unspecified: Secondary | ICD-10-CM | POA: Diagnosis present

## 2014-01-02 DIAGNOSIS — Z801 Family history of malignant neoplasm of trachea, bronchus and lung: Secondary | ICD-10-CM

## 2014-01-02 DIAGNOSIS — Z7982 Long term (current) use of aspirin: Secondary | ICD-10-CM

## 2014-01-02 DIAGNOSIS — Z794 Long term (current) use of insulin: Secondary | ICD-10-CM

## 2014-01-02 DIAGNOSIS — Z803 Family history of malignant neoplasm of breast: Secondary | ICD-10-CM

## 2014-01-02 DIAGNOSIS — G9340 Encephalopathy, unspecified: Principal | ICD-10-CM

## 2014-01-02 HISTORY — DX: Benign prostatic hyperplasia without lower urinary tract symptoms: N40.0

## 2014-01-02 LAB — CBC WITH DIFFERENTIAL/PLATELET
BASOS PCT: 0 % (ref 0–1)
Basophils Absolute: 0 10*3/uL (ref 0.0–0.1)
EOS ABS: 0 10*3/uL (ref 0.0–0.7)
Eosinophils Relative: 0 % (ref 0–5)
HCT: 38.2 % — ABNORMAL LOW (ref 39.0–52.0)
HEMOGLOBIN: 12.2 g/dL — AB (ref 13.0–17.0)
LYMPHS ABS: 1 10*3/uL (ref 0.7–4.0)
Lymphocytes Relative: 4 % — ABNORMAL LOW (ref 12–46)
MCH: 23.5 pg — ABNORMAL LOW (ref 26.0–34.0)
MCHC: 31.9 g/dL (ref 30.0–36.0)
MCV: 73.6 fL — AB (ref 78.0–100.0)
Monocytes Absolute: 1.5 10*3/uL — ABNORMAL HIGH (ref 0.1–1.0)
Monocytes Relative: 6 % (ref 3–12)
NEUTROS ABS: 22.7 10*3/uL — AB (ref 1.7–7.7)
Neutrophils Relative %: 90 % — ABNORMAL HIGH (ref 43–77)
Platelets: 218 10*3/uL (ref 150–400)
RBC: 5.19 MIL/uL (ref 4.22–5.81)
RDW: 19.6 % — ABNORMAL HIGH (ref 11.5–15.5)
WBC: 25.2 10*3/uL — ABNORMAL HIGH (ref 4.0–10.5)

## 2014-01-02 LAB — COMPREHENSIVE METABOLIC PANEL
ALT: 12 U/L (ref 0–53)
AST: 18 U/L (ref 0–37)
Albumin: 3.9 g/dL (ref 3.5–5.2)
Alkaline Phosphatase: 86 U/L (ref 39–117)
BUN: 26 mg/dL — AB (ref 6–23)
CO2: 20 mEq/L (ref 19–32)
CREATININE: 2.52 mg/dL — AB (ref 0.50–1.35)
Calcium: 9.5 mg/dL (ref 8.4–10.5)
Chloride: 99 mEq/L (ref 96–112)
GFR calc non Af Amer: 23 mL/min — ABNORMAL LOW (ref >90)
GFR, EST AFRICAN AMERICAN: 26 mL/min — AB (ref >90)
GLUCOSE: 232 mg/dL — AB (ref 70–99)
POTASSIUM: 3.3 meq/L — AB (ref 3.7–5.3)
Sodium: 137 mEq/L (ref 137–147)
TOTAL PROTEIN: 7 g/dL (ref 6.0–8.3)
Total Bilirubin: 0.8 mg/dL (ref 0.3–1.2)

## 2014-01-02 LAB — URINALYSIS, ROUTINE W REFLEX MICROSCOPIC
BILIRUBIN URINE: NEGATIVE
Glucose, UA: >1000 mg/dL — AB
Hgb urine dipstick: NEGATIVE
Ketones, ur: NEGATIVE mg/dL
Leukocytes, UA: NEGATIVE
NITRITE: NEGATIVE
Protein, ur: 100 mg/dL — AB
Specific Gravity, Urine: 1.017 (ref 1.005–1.030)
UROBILINOGEN UA: 0.2 mg/dL (ref 0.0–1.0)
pH: 5 (ref 5.0–8.0)

## 2014-01-02 LAB — I-STAT CG4 LACTIC ACID, ED: LACTIC ACID, VENOUS: 1.77 mmol/L (ref 0.5–2.2)

## 2014-01-02 LAB — URINE MICROSCOPIC-ADD ON

## 2014-01-02 MED ORDER — SODIUM CHLORIDE 0.9 % IV BOLUS (SEPSIS)
30.0000 mL/kg | Freq: Once | INTRAVENOUS | Status: AC
  Administered 2014-01-02: 2670 mL via INTRAVENOUS

## 2014-01-02 MED ORDER — ACETAMINOPHEN 325 MG PO TABS
650.0000 mg | ORAL_TABLET | Freq: Once | ORAL | Status: AC
  Administered 2014-01-02: 650 mg via ORAL
  Filled 2014-01-02: qty 2

## 2014-01-02 MED ORDER — SODIUM CHLORIDE 0.9 % IV SOLN
1000.0000 mL | INTRAVENOUS | Status: DC
  Administered 2014-01-03: 1000 mL via INTRAVENOUS

## 2014-01-02 NOTE — ED Notes (Signed)
Pt has a urinal at bedside and is aware of the need for a sample.

## 2014-01-02 NOTE — ED Provider Notes (Signed)
CSN: [id number]     Arrival date & time 01/02/14  2154 History   First MD Initiated Contact with Patient 01/02/14 2206     Chief Complaint  Patient presents with  . Altered Mental Status     (Consider location/radiation/quality/duration/timing/severity/associated sxs/prior Treatment) HPI 80 year old male presents with altered mental status over the last several hours. Approximately 3 hours prior to arrival the wife noticed him acting odd. The patient has been saying odd things and when they went to the bedroom he had brought multiple items from their dining room table. He also does the patient had a fever of 101. Patient not any cough, dysuria, abdominal pain, or other symptoms. Family states he does not complaining about anything. They gave him ibuprofen and his insulin prior to arrival.  Past Medical History  Diagnosis Date  . HTN (hypertension)   . Diabetes mellitus   . Paresthesia   . Macular degeneration   . CVA (cerebral infarction)   . Ptosis   . Gait disturbance   . Heart murmur    Past Surgical History  Procedure Laterality Date  . Prostate surgery    . Eye surgery     No family history on file. History  Substance Use Topics  . Smoking status: Former Games developermoker  . Smokeless tobacco: Never Used  . Alcohol Use: No    Review of Systems  Constitutional: Positive for fever.  Respiratory: Negative for cough.   Gastrointestinal: Negative for vomiting and abdominal pain.  Genitourinary: Negative for dysuria.  Psychiatric/Behavioral: Positive for confusion.  All other systems reviewed and are negative.     Allergies  Heparin  Home Medications   Prior to Admission medications   Medication Sig Start Date End Date Taking? Authorizing Provider  allopurinol (ZYLOPRIM) 100 MG tablet Take 200 mg by mouth at bedtime.  11/07/11  Yes Historical Provider, MD  amLODipine (NORVASC) 10 MG tablet Take 10 mg by mouth daily.   Yes Historical Provider, MD  aspirin EC 81 MG tablet  Take 81 mg by mouth daily.   Yes Historical Provider, MD  docusate sodium (COLACE) 100 MG capsule Take 100 mg by mouth 2 (two) times daily.   Yes Historical Provider, MD  Empagliflozin-Linagliptin (GLYXAMBI) 25-5 MG TABS Take 1 tablet by mouth daily.   Yes Historical Provider, MD  ezetimibe (ZETIA) 10 MG tablet Take 10 mg by mouth at bedtime.   Yes Historical Provider, MD  ferrous fumarate (HEMOCYTE - 106 MG FE) 325 (106 FE) MG TABS tablet Take 1 tablet by mouth 2 (two) times daily.   Yes Historical Provider, MD  folic acid (FOLVITE) 1 MG tablet Take 1 mg by mouth daily.    Yes Historical Provider, MD  furosemide (LASIX) 40 MG tablet Take 40 mg by mouth 2 (two) times daily.   Yes Historical Provider, MD  hydrALAZINE (APRESOLINE) 50 MG tablet Take 50 mg by mouth 3 (three) times daily.  12/26/11  Yes Historical Provider, MD  insulin glargine (LANTUS) 100 UNIT/ML injection Inject 10-40 Units into the skin 2 (two) times daily. 10 units in the morning and 40 units at bedtime.   Yes Historical Provider, MD  metoprolol (LOPRESSOR) 50 MG tablet Take 50 mg by mouth 2 (two) times daily.   Yes Historical Provider, MD  omeprazole-sodium bicarbonate (ZEGERID) 40-1100 MG per capsule Take 1 capsule by mouth daily before breakfast.   Yes Historical Provider, MD  prednisoLONE acetate (PRED FORTE) 1 % ophthalmic suspension Place 1 drop into the left eye  2 (two) times daily.   Yes Historical Provider, MD  senna (SENOKOT) 8.6 MG TABS tablet Take 1 tablet by mouth at bedtime.   Yes Historical Provider, MD  Vitamin D, Ergocalciferol, (DRISDOL) 50000 UNITS CAPS capsule Take 50,000 Units by mouth every 7 (seven) days. Once a week for 4 weeks then once per month for 6 months   Yes Historical Provider, MD   BP 156/70  Pulse 91  Temp(Src) 101.6 F (38.7 C) (Oral)  Resp 22  Wt 196 lb 3.4 oz (89 kg)  SpO2 95% Physical Exam  Nursing note and vitals reviewed. Constitutional: He is oriented to person, place, and time. He  appears well-developed and well-nourished. No distress.  HENT:  Head: Normocephalic and atraumatic.  Right Ear: External ear normal.  Left Ear: External ear normal.  Nose: Nose normal.  Eyes: Right eye exhibits no discharge. Left eye exhibits no discharge.  Neck: Neck supple.  Cardiovascular: Normal rate, regular rhythm and intact distal pulses.   Murmur heard. Pulmonary/Chest: Effort normal and breath sounds normal. He has no wheezes. He has no rales.  Abdominal: Soft. He exhibits no distension. There is no tenderness.  Musculoskeletal: He exhibits no edema.  Neurological: He is alert and oriented to person, place, and time.  Skin: Skin is warm and dry.    ED Course  Procedures (including critical care time) Labs Review Labs Reviewed  CBC WITH DIFFERENTIAL - Abnormal; Notable for the following:    WBC 25.2 (*)    Hemoglobin 12.2 (*)    HCT 38.2 (*)    MCV 73.6 (*)    MCH 23.5 (*)    RDW 19.6 (*)    Neutrophils Relative % 90 (*)    Lymphocytes Relative 4 (*)    Neutro Abs 22.7 (*)    Monocytes Absolute 1.5 (*)    All other components within normal limits  COMPREHENSIVE METABOLIC PANEL - Abnormal; Notable for the following:    Potassium 3.3 (*)    Glucose, Bld 232 (*)    BUN 26 (*)    Creatinine, Ser 2.52 (*)    GFR calc non Af Amer 23 (*)    GFR calc Af Amer 26 (*)    All other components within normal limits  URINALYSIS, ROUTINE W REFLEX MICROSCOPIC - Abnormal; Notable for the following:    Glucose, UA >1000 (*)    Protein, ur 100 (*)    All other components within normal limits  CULTURE, BLOOD (ROUTINE X 2)  CULTURE, BLOOD (ROUTINE X 2)  URINE CULTURE  URINE MICROSCOPIC-ADD ON  I-STAT CG4 LACTIC ACID, ED    Imaging Review Dg Chest 2 View  01/02/2014   CLINICAL DATA:  Altered mental status.  Fever.  EXAM: CHEST  2 VIEW  COMPARISON:  01/14/2013  FINDINGS: Postoperative changes in the mediastinum since previous study. Borderline heart size without vascular  congestion. No focal airspace disease or consolidation in the lungs. No blunting of costophrenic angles. No pneumothorax.  IMPRESSION: No active cardiopulmonary disease.   Electronically Signed   By: Burman NievesWilliam  Stevens M.D.   On: 01/02/2014 23:33   Ct Head Wo Contrast  01/02/2014   CLINICAL DATA:  Altered mental status. Fever and high blood sugar. Confusion.  EXAM: CT HEAD WITHOUT CONTRAST  TECHNIQUE: Contiguous axial images were obtained from the base of the skull through the vertex without intravenous contrast.  COMPARISON:  MRI brain 09/11/2011  FINDINGS: Diffuse cerebral atrophy. No significant ventricular dilatation. Patchy low-attenuation changes in the  deep white matter consistent with small vessel ischemia. Old lacune or infarct in the right periventricular white matter. No mass effect or midline shift. No abnormal extra-axial fluid collections. Gray-white matter junctions are distinct. Basal cisterns are not effaced. No evidence of acute intracranial hemorrhage. No depressed skull fractures. Visualized paranasal sinuses and mastoid air cells are not opacified.  IMPRESSION: No acute intracranial abnormalities. Chronic atrophy and small vessel ischemic changes.   Electronically Signed   By: Burman Nieves M.D.   On: 01/02/2014 23:39     EKG Interpretation None      MDM   Final diagnoses:  Fever, unspecified fever cause    Patient does not have an obvious source of his fever. After he was given Tylenol his fever down the patient was back to his normal mental baseline per the wife. Due to this, I have low suspicion that this is related to a CNS infection. Given his history of aortic valve replacement, there is concern for possible endocarditis. Blood cultures will be obtained, will give broad antibiotics, and admit to the hospitalist. Stable at this time.     Audree Camel, MD 01/03/14 [phone]

## 2014-01-02 NOTE — ED Notes (Signed)
Per EMS, pt family states the pt is "not acting his normal self". Pt complains of fever and high blood sugar, which was 235. Pt states his wife noticed he was acting altered while they were eating dinner at `5PM today. Pt A&Ox4.

## 2014-01-02 NOTE — ED Notes (Signed)
Bed: [id number] Expected date:  Expected time:  Means of arrival:  Comments: EMS 81yo altered mental status, fever

## 2014-01-02 NOTE — ED Notes (Signed)
Bedside report received from previous RN, Matt. 

## 2014-01-03 ENCOUNTER — Encounter (HOSPITAL_COMMUNITY): Payer: Self-pay | Admitting: Internal Medicine

## 2014-01-03 DIAGNOSIS — I2510 Atherosclerotic heart disease of native coronary artery without angina pectoris: Secondary | ICD-10-CM | POA: Diagnosis present

## 2014-01-03 DIAGNOSIS — Z954 Presence of other heart-valve replacement: Secondary | ICD-10-CM

## 2014-01-03 DIAGNOSIS — G9340 Encephalopathy, unspecified: Secondary | ICD-10-CM | POA: Diagnosis present

## 2014-01-03 DIAGNOSIS — Z8669 Personal history of other diseases of the nervous system and sense organs: Secondary | ICD-10-CM | POA: Diagnosis present

## 2014-01-03 DIAGNOSIS — R509 Fever, unspecified: Secondary | ICD-10-CM

## 2014-01-03 DIAGNOSIS — N281 Cyst of kidney, acquired: Secondary | ICD-10-CM | POA: Insufficient documentation

## 2014-01-03 DIAGNOSIS — Z952 Presence of prosthetic heart valve: Secondary | ICD-10-CM

## 2014-01-03 DIAGNOSIS — I359 Nonrheumatic aortic valve disorder, unspecified: Secondary | ICD-10-CM

## 2014-01-03 LAB — CBC
HEMATOCRIT: 37 % — AB (ref 39.0–52.0)
HEMOGLOBIN: 11.4 g/dL — AB (ref 13.0–17.0)
MCH: 22.9 pg — ABNORMAL LOW (ref 26.0–34.0)
MCHC: 30.8 g/dL (ref 30.0–36.0)
MCV: 74.3 fL — ABNORMAL LOW (ref 78.0–100.0)
Platelets: 189 10*3/uL (ref 150–400)
RBC: 4.98 MIL/uL (ref 4.22–5.81)
RDW: 20.2 % — ABNORMAL HIGH (ref 11.5–15.5)
WBC: 22.6 10*3/uL — AB (ref 4.0–10.5)

## 2014-01-03 LAB — GLUCOSE, CAPILLARY
GLUCOSE-CAPILLARY: 158 mg/dL — AB (ref 70–99)
GLUCOSE-CAPILLARY: 228 mg/dL — AB (ref 70–99)
GLUCOSE-CAPILLARY: 178 mg/dL — AB (ref 70–99)
Glucose-Capillary: 208 mg/dL — ABNORMAL HIGH (ref 70–99)

## 2014-01-03 LAB — BASIC METABOLIC PANEL
BUN: 25 mg/dL — ABNORMAL HIGH (ref 6–23)
CHLORIDE: 105 meq/L (ref 96–112)
CO2: 20 mEq/L (ref 19–32)
Calcium: 8.8 mg/dL (ref 8.4–10.5)
Creatinine, Ser: 2.34 mg/dL — ABNORMAL HIGH (ref 0.50–1.35)
GFR, EST AFRICAN AMERICAN: 29 mL/min — AB (ref >90)
GFR, EST NON AFRICAN AMERICAN: 25 mL/min — AB (ref >90)
Glucose, Bld: 219 mg/dL — ABNORMAL HIGH (ref 70–99)
Potassium: 3 mEq/L — ABNORMAL LOW (ref 3.7–5.3)
SODIUM: 141 meq/L (ref 137–147)

## 2014-01-03 LAB — SEDIMENTATION RATE: Sed Rate: 20 mm/hr — ABNORMAL HIGH (ref 0–16)

## 2014-01-03 LAB — CK: CK TOTAL: 211 U/L (ref 7–232)

## 2014-01-03 MED ORDER — ALLOPURINOL 100 MG PO TABS
200.0000 mg | ORAL_TABLET | Freq: Every day | ORAL | Status: DC
  Administered 2014-01-03 – 2014-01-04 (×2): 200 mg via ORAL
  Filled 2014-01-03 (×3): qty 2

## 2014-01-03 MED ORDER — ONDANSETRON HCL 4 MG PO TABS: 4.0000 mg | ORAL_TABLET | Freq: Four times a day (QID) | ORAL | Status: DC | PRN

## 2014-01-03 MED ORDER — EZETIMIBE 10 MG PO TABS
10.0000 mg | ORAL_TABLET | Freq: Every day | ORAL | Status: DC
  Administered 2014-01-03 – 2014-01-04 (×2): 10 mg via ORAL
  Filled 2014-01-03 (×3): qty 1

## 2014-01-03 MED ORDER — ACETAMINOPHEN 650 MG RE SUPP
650.0000 mg | Freq: Four times a day (QID) | RECTAL | Status: DC | PRN
Start: 2014-01-03 — End: 2014-01-05

## 2014-01-03 MED ORDER — DEXTROSE 5 % IV SOLN
2.0000 g | Freq: Once | INTRAVENOUS | Status: AC
  Administered 2014-01-03: 2 g via INTRAVENOUS
  Filled 2014-01-03: qty 2

## 2014-01-03 MED ORDER — DOCUSATE SODIUM 100 MG PO CAPS
100.0000 mg | ORAL_CAPSULE | Freq: Two times a day (BID) | ORAL | Status: DC
  Administered 2014-01-03 – 2014-01-04 (×4): 100 mg via ORAL
  Filled 2014-01-03 (×6): qty 1

## 2014-01-03 MED ORDER — AMLODIPINE BESYLATE 10 MG PO TABS
10.0000 mg | ORAL_TABLET | Freq: Every day | ORAL | Status: DC
  Administered 2014-01-03 – 2014-01-04 (×2): 10 mg via ORAL
  Filled 2014-01-03 (×3): qty 1

## 2014-01-03 MED ORDER — INSULIN GLARGINE 100 UNIT/ML ~~LOC~~ SOLN: 40.0000 [IU] | Freq: Every day | SUBCUTANEOUS | Status: DC

## 2014-01-03 MED ORDER — PREDNISOLONE ACETATE 1 % OP SUSP
1.0000 [drp] | Freq: Two times a day (BID) | OPHTHALMIC | Status: DC
  Administered 2014-01-03 – 2014-01-04 (×4): 1 [drp] via OPHTHALMIC
  Filled 2014-01-03: qty 1

## 2014-01-03 MED ORDER — INSULIN GLARGINE 100 UNIT/ML ~~LOC~~ SOLN
20.0000 [IU] | Freq: Every day | SUBCUTANEOUS | Status: DC
  Administered 2014-01-03 – 2014-01-04 (×2): 20 [IU] via SUBCUTANEOUS
  Filled 2014-01-03 (×3): qty 0.2

## 2014-01-03 MED ORDER — VITAMIN D (ERGOCALCIFEROL) 1.25 MG (50000 UNIT) PO CAPS: 50000.0000 [IU] | ORAL_CAPSULE | ORAL | Status: DC

## 2014-01-03 MED ORDER — VANCOMYCIN HCL IN DEXTROSE 1-5 GM/200ML-% IV SOLN
1000.0000 mg | INTRAVENOUS | Status: DC
  Administered 2014-01-04: 1000 mg via INTRAVENOUS
  Filled 2014-01-03: qty 200

## 2014-01-03 MED ORDER — FERROUS FUMARATE 325 (106 FE) MG PO TABS
1.0000 | ORAL_TABLET | Freq: Two times a day (BID) | ORAL | Status: DC
  Administered 2014-01-03 – 2014-01-04 (×4): 106 mg via ORAL
  Filled 2014-01-03 (×7): qty 1

## 2014-01-03 MED ORDER — HYDRALAZINE HCL 50 MG PO TABS
50.0000 mg | ORAL_TABLET | Freq: Three times a day (TID) | ORAL | Status: DC
  Administered 2014-01-03 – 2014-01-04 (×5): 50 mg via ORAL
  Filled 2014-01-03 (×9): qty 1

## 2014-01-03 MED ORDER — SODIUM CHLORIDE 0.9 % IV SOLN
INTRAVENOUS | Status: DC
  Administered 2014-01-03: 20:00:00 via INTRAVENOUS

## 2014-01-03 MED ORDER — SENNA 8.6 MG PO TABS
1.0000 | ORAL_TABLET | Freq: Every day | ORAL | Status: DC
  Administered 2014-01-03 – 2014-01-04 (×2): 8.6 mg via ORAL
  Filled 2014-01-03 (×2): qty 1

## 2014-01-03 MED ORDER — POTASSIUM CHLORIDE CRYS ER 20 MEQ PO TBCR
20.0000 meq | EXTENDED_RELEASE_TABLET | Freq: Once | ORAL | Status: AC
  Administered 2014-01-03: 20 meq via ORAL
  Filled 2014-01-03: qty 1

## 2014-01-03 MED ORDER — METOPROLOL TARTRATE 50 MG PO TABS
50.0000 mg | ORAL_TABLET | Freq: Two times a day (BID) | ORAL | Status: DC
  Administered 2014-01-03 – 2014-01-04 (×3): 50 mg via ORAL
  Filled 2014-01-03 (×7): qty 1

## 2014-01-03 MED ORDER — INSULIN GLARGINE 100 UNIT/ML ~~LOC~~ SOLN
10.0000 [IU] | Freq: Every day | SUBCUTANEOUS | Status: DC
  Administered 2014-01-03 – 2014-01-04 (×2): 10 [IU] via SUBCUTANEOUS
  Filled 2014-01-03 (×3): qty 0.1

## 2014-01-03 MED ORDER — DEXTROSE 5 % IV SOLN
1.0000 g | INTRAVENOUS | Status: DC
  Administered 2014-01-03: 1 g via INTRAVENOUS
  Filled 2014-01-03: qty 1

## 2014-01-03 MED ORDER — VANCOMYCIN HCL 10 G IV SOLR
1250.0000 mg | Freq: Once | INTRAVENOUS | Status: AC
  Administered 2014-01-03: 1250 mg via INTRAVENOUS
  Filled 2014-01-03: qty 1250

## 2014-01-03 MED ORDER — INSULIN ASPART 100 UNIT/ML ~~LOC~~ SOLN
0.0000 [IU] | Freq: Three times a day (TID) | SUBCUTANEOUS | Status: DC
  Administered 2014-01-03 (×2): 2 [IU] via SUBCUTANEOUS
  Administered 2014-01-03: 3 [IU] via SUBCUTANEOUS
  Administered 2014-01-04: 2 [IU] via SUBCUTANEOUS
  Administered 2014-01-04 (×2): 3 [IU] via SUBCUTANEOUS

## 2014-01-03 MED ORDER — SODIUM CHLORIDE 0.9 % IV SOLN
INTRAVENOUS | Status: AC
  Administered 2014-01-03: 02:00:00 via INTRAVENOUS

## 2014-01-03 MED ORDER — ACETAMINOPHEN 325 MG PO TABS
650.0000 mg | ORAL_TABLET | Freq: Four times a day (QID) | ORAL | Status: DC | PRN
Start: 2014-01-03 — End: 2014-01-05

## 2014-01-03 MED ORDER — FOLIC ACID 1 MG PO TABS
1.0000 mg | ORAL_TABLET | Freq: Every day | ORAL | Status: DC
  Administered 2014-01-03 – 2014-01-04 (×2): 1 mg via ORAL
  Filled 2014-01-03 (×3): qty 1

## 2014-01-03 MED ORDER — ASPIRIN EC 81 MG PO TBEC
81.0000 mg | DELAYED_RELEASE_TABLET | Freq: Every day | ORAL | Status: DC
  Administered 2014-01-03 – 2014-01-04 (×2): 81 mg via ORAL
  Filled 2014-01-03 (×3): qty 1

## 2014-01-03 MED ORDER — PANTOPRAZOLE SODIUM 40 MG PO TBEC
40.0000 mg | DELAYED_RELEASE_TABLET | Freq: Every day | ORAL | Status: DC
  Administered 2014-01-03 – 2014-01-04 (×2): 40 mg via ORAL
  Filled 2014-01-03 (×3): qty 1

## 2014-01-03 MED ORDER — ONDANSETRON HCL 4 MG/2ML IJ SOLN: 4.0000 mg | Freq: Four times a day (QID) | INTRAMUSCULAR | Status: DC | PRN

## 2014-01-03 NOTE — Progress Notes (Signed)
  Echocardiogram 2D Echocardiogram has been performed.  CHUNG, KWONG 01/03/2014, 4:02 PM

## 2014-01-03 NOTE — Care Management Note (Addendum)
  Page 1 of 1   01/03/2014     12:16:06 PM CARE MANAGEMENT NOTE 01/03/2014  Patient:  Johnny [name], Johnny [name]   Account Number:  [id number]  Date Initiated:  01/03/2014  Documentation initiated by:  [facility]  Subjective/Objective Assessment:   adm: FEVER     Action/Plan:   discharge planning   Anticipated DC Date:  01/06/2014   Anticipated DC Plan:  HOME W/SELF CARE      DC Planning Services  CM consult      Choice offered to / List presented to:             Status of service:  Completed, signed off Medicare Important Message given?   (If response is "NO", the following Medicare IM given date fields will be blank) Date Medicare IM given:   Date Additional Medicare IM given:    Discharge Disposition:    Per UR Regulation:  Reviewed for med. necessity/level of care/duration of stay  If discussed at [place] of Stay Meetings, dates discussed:    Comments:  01/03/14 12:00 CM met with pt in room.  Pt lives at home with his wife. CM will continue to follow for discharge needs.  Mariane Masters, BSN, Cm [phone].

## 2014-01-03 NOTE — Progress Notes (Signed)
ANTIBIOTIC CONSULT NOTE - INITIAL  Pharmacy Consult for Vancomycin, cefepime Indication: empiric--r/o endocarditis/fever  Allergies  Allergen Reactions  . Heparin     Other reaction(s): Heparin-Induced Thrombocytopenia    Patient Measurements: Height: 5' 8.11" (173 cm) Weight: 196 lb 3.4 oz (89 kg) IBW/kg (Calculated) : 68.65 Adjusted Body Weight:   Vital Signs: Temp: 98.6 F (37 C) (06/25 0502) Temp src: Oral (06/25 0502) BP: 129/54 mmHg (06/25 0502) Pulse Rate: 76 (06/25 0502) Intake/Output from previous day:   Intake/Output from this shift:    Labs:  Recent Labs  01/02/14 2302 01/03/14 0507  WBC 25.2* 22.6*  HGB 12.2* 11.4*  PLT 218 189  CREATININE 2.52*  --    Estimated Creatinine Clearance: 25.8 ml/min (by C-G formula based on Cr of 2.52). No results found for this basename: VANCOTROUGH, VANCOPEAK, VANCORANDOM, GENTTROUGH, GENTPEAK, GENTRANDOM, TOBRATROUGH, TOBRAPEAK, TOBRARND, AMIKACINPEAK, AMIKACINTROU, AMIKACIN,  in the last 72 hours   Microbiology: No results found for this or any previous visit (from the past 720 hour(s)).  Medical History: Past Medical History  Diagnosis Date  . HTN (hypertension)   . Diabetes mellitus   . Paresthesia   . Macular degeneration   . CVA (cerebral infarction)   . Ptosis   . Gait disturbance   . Heart murmur     Medications:  Anti-infectives   Start     Dose/Rate Route Frequency Ordered Stop   01/04/14 0600  vancomycin (VANCOCIN) IVPB 1000 mg/200 mL premix     1,000 mg 200 mL/hr over 60 Minutes Intravenous Every 24 hours 01/03/14 0604     01/03/14 2200  ceFEPIme (MAXIPIME) 1 g in dextrose 5 % 50 mL IVPB     1 g 100 mL/hr over 30 Minutes Intravenous Every 24 hours 01/03/14 0604     01/03/14 0015  vancomycin (VANCOCIN) 1,250 mg in sodium chloride 0.9 % 250 mL IVPB     1,250 mg 166.7 mL/hr over 90 Minutes Intravenous  Once 01/03/14 0003 01/03/14 0313   01/03/14 0015  ceFEPIme (MAXIPIME) 2 g in dextrose 5 % 50  mL IVPB     2 g 100 mL/hr over 30 Minutes Intravenous  Once 01/03/14 0014 01/03/14 0234     Assessment: Patient with empiric--r/o endocarditis/fever.  First dose of antibiotics already given. Patient with very poor renal function.  Goal of Therapy:  Vancomycin trough level 15-20 mcg/ml Cefepime dosed based on patient weight and renal function   Plan:  Measure antibiotic drug levels at steady state Follow up culture results Vancomycin 1gm iv q24hr, cefepime 1gm iv q24hr  Aleene DavidsonGrimsley Jr, Adylee Leonardo Crowford 01/03/2014,6:05 AM

## 2014-01-03 NOTE — H&P (Signed)
[facility] History and Physical  Johnny [name] JYN:[id number] DOB: September 22, 1932 DOA: 01/02/2014  Referring physician: ER physician. PCP: Michiel SitesKOHUT,WALTER DENNIS, MD   Chief Complaint: Fever and confusion.  HPI: Johnny FellingSolomon B [name] is a 80 y.o. male with history of aortic valve replacement (Pig valve), CAD status post CABG, diabetes mellitus, history of CVA, hypertension, chronic kidney disease last evening around 7 PM while eating pizza patient suddenly became confused and was having rigors. His wife checked his temperature it is more than 100F. Since his confusion persisted EMS was called and patient was brought to the ER. In the ER patient's temperature was found to be around 101.75F with leukocytosis. By the time patient is in the ER patient's confusion has resolved and patient is presently alert awake oriented to time place person and moves all extremities. Patient denies any headache neck pain chest pain shortness of breath productive cough nausea vomiting abdominal pain diarrhea. Patient has no focal deficits. CT head is negative. Patient did receive fluid bolus. Blood cultures have been obtained along with CK levels sedimentation rate. Patient's aortic valve replacement was last September. Patient presently is not in acute distress. Patient's wife states that over the last 2 weeks their house air-conditioning was not working. Patient was just restarted back on Glyxambi for diabetes last week. There is no reported hypoglycemic episodes. Patient did not lose consciousness.  Review of Systems: As presented in the history of presenting illness, rest negative.  Past Medical History  Diagnosis Date  . HTN (hypertension)   . Diabetes mellitus   . Paresthesia   . Macular degeneration   . CVA (cerebral infarction)   . Ptosis   . Gait disturbance   . Heart murmur    Past Surgical History  Procedure Laterality Date  . Prostate surgery    . Eye surgery     Social History:  reports that he  has quit smoking. He has never used smokeless tobacco. He reports that he does not drink alcohol or use illicit drugs. Where does patient live home. Can patient participate in ADLs? Yes.  Allergies  Allergen Reactions  . Heparin     Other reaction(s): Heparin-Induced Thrombocytopenia    Family History:  Family History  Problem Relation Age of Onset  . Breast cancer Sister   . Lung cancer Brother       Prior to Admission medications   Medication Sig Start Date End Date Taking? Authorizing Provider  allopurinol (ZYLOPRIM) 100 MG tablet Take 200 mg by mouth at bedtime.  11/07/11  Yes Historical Provider, MD  amLODipine (NORVASC) 10 MG tablet Take 10 mg by mouth daily.   Yes Historical Provider, MD  aspirin EC 81 MG tablet Take 81 mg by mouth daily.   Yes Historical Provider, MD  docusate sodium (COLACE) 100 MG capsule Take 100 mg by mouth 2 (two) times daily.   Yes Historical Provider, MD  Empagliflozin-Linagliptin (GLYXAMBI) 25-5 MG TABS Take 1 tablet by mouth daily.   Yes Historical Provider, MD  ezetimibe (ZETIA) 10 MG tablet Take 10 mg by mouth at bedtime.   Yes Historical Provider, MD  ferrous fumarate (HEMOCYTE - 106 MG FE) 325 (106 FE) MG TABS tablet Take 1 tablet by mouth 2 (two) times daily.   Yes Historical Provider, MD  folic acid (FOLVITE) 1 MG tablet Take 1 mg by mouth daily.    Yes Historical Provider, MD  furosemide (LASIX) 40 MG tablet Take 40 mg by mouth 2 (two) times daily.  Yes Historical Provider, MD  hydrALAZINE (APRESOLINE) 50 MG tablet Take 50 mg by mouth 3 (three) times daily.  12/26/11  Yes Historical Provider, MD  insulin glargine (LANTUS) 100 UNIT/ML injection Inject 10-40 Units into the skin 2 (two) times daily. 10 units in the morning and 40 units at bedtime.   Yes Historical Provider, MD  metoprolol (LOPRESSOR) 50 MG tablet Take 50 mg by mouth 2 (two) times daily.   Yes Historical Provider, MD  omeprazole-sodium bicarbonate (ZEGERID) 40-1100 MG per capsule  Take 1 capsule by mouth daily before breakfast.   Yes Historical Provider, MD  prednisoLONE acetate (PRED FORTE) 1 % ophthalmic suspension Place 1 drop into the left eye 2 (two) times daily.   Yes Historical Provider, MD  senna (SENOKOT) 8.6 MG TABS tablet Take 1 tablet by mouth at bedtime.   Yes Historical Provider, MD  Vitamin D, Ergocalciferol, (DRISDOL) 50000 UNITS CAPS capsule Take 50,000 Units by mouth every 7 (seven) days. Once a week for 4 weeks then once per month for 6 months   Yes Historical Provider, MD    Physical Exam: Filed Vitals:   01/02/14 2157 01/02/14 2202 01/03/14 0013 01/03/14 0040  BP: 156/70   129/67  Pulse: 91   79  Temp: 101.6 F (38.7 C)     TempSrc: Oral     Resp: 22   14  Height:   5' 8.11" (1.73 m)   Weight:  89 kg (196 lb 3.4 oz)    SpO2: 95%   97%     General:  Well-developed and nourished.  Eyes: Anicteric no pallor.  ENT: No discharge from the ears eyes nose mouth.  Neck: No mass felt no neck rigidity.  Cardiovascular: S1-S2 heard.  Respiratory: No rhonchi or crepitations.  Abdomen: Soft nontender bowel sounds present. No guarding rigidity.  Skin: No rash.  Musculoskeletal: No edema.  Psychiatric: Appears normal.  Neurologic: Alert and oriented to time place and person. Moves all extremities. Has poor vision in both eyes which family has been stating is chronic.  Labs on Admission:  Basic Metabolic Panel:  Recent Labs Lab 01/02/14 2302  NA 137  K 3.3*  CL 99  CO2 20  GLUCOSE 232*  BUN 26*  CREATININE 2.52*  CALCIUM 9.5   Liver Function Tests:  Recent Labs Lab 01/02/14 2302  AST 18  ALT 12  ALKPHOS 86  BILITOT 0.8  PROT 7.0  ALBUMIN 3.9   No results found for this basename: LIPASE, AMYLASE,  in the last 168 hours No results found for this basename: AMMONIA,  in the last 168 hours CBC:  Recent Labs Lab 01/02/14 2302  WBC 25.2*  NEUTROABS 22.7*  HGB 12.2*  HCT 38.2*  MCV 73.6*  PLT 218   Cardiac  Enzymes: No results found for this basename: CKTOTAL, CKMB, CKMBINDEX, TROPONINI,  in the last 168 hours  BNP (last 3 results) No results found for this basename: PROBNP,  in the last 8760 hours CBG: No results found for this basename: GLUCAP,  in the last 168 hours  Radiological Exams on Admission: Dg Chest 2 View  01/02/2014   CLINICAL DATA:  Altered mental status.  Fever.  EXAM: CHEST  2 VIEW  COMPARISON:  01/14/2013  FINDINGS: Postoperative changes in the mediastinum since previous study. Borderline heart size without vascular congestion. No focal airspace disease or consolidation in the lungs. No blunting of costophrenic angles. No pneumothorax.  IMPRESSION: No active cardiopulmonary disease.   Electronically Signed  By: Burman Nieves M.D.   On: 01/02/2014 23:33   Ct Head Wo Contrast  01/02/2014   CLINICAL DATA:  Altered mental status. Fever and high blood sugar. Confusion.  EXAM: CT HEAD WITHOUT CONTRAST  TECHNIQUE: Contiguous axial images were obtained from the base of the skull through the vertex without intravenous contrast.  COMPARISON:  MRI brain 09/11/2011  FINDINGS: Diffuse cerebral atrophy. No significant ventricular dilatation. Patchy low-attenuation changes in the deep white matter consistent with small vessel ischemia. Old lacune or infarct in the right periventricular white matter. No mass effect or midline shift. No abnormal extra-axial fluid collections. Gray-white matter junctions are distinct. Basal cisterns are not effaced. No evidence of acute intracranial hemorrhage. No depressed skull fractures. Visualized paranasal sinuses and mastoid air cells are not opacified.  IMPRESSION: No acute intracranial abnormalities. Chronic atrophy and small vessel ischemic changes.   Electronically Signed   By: Burman Nieves M.D.   On: 01/02/2014 23:39    Assessment/Plan Principal Problem:   Acute encephalopathy Active Problems:   Diabetes mellitus   Fever   Aortic valve  replaced   CAD in native artery   1. Acute encephalopathy with fever - most likely secondary to heat. But given the leukocytosis we will get blood cultures and since patient has had recent aortic valve replacement we will keep patient on empiric antibiotics until the culture results are available. Follow CK levels and sedimentation rate. Continue gentle hydration and hold Lasix for now. 2. Diabetes mellitus - we will keep patient on sliding-scale coverage along with Lantus and we will hold patient's Glyxambi for now. 3. Chronic kidney disease stage III - for now we'll gently hydrating patient and holding off Lasix. See #1. 4. CAD status post CABG and aortic valve replacement with pig valve - denies any chest pain or shortness of breath. 5. Hypertension - continue present medications. 6. Leukocytosis - see #1.    Code Status: Full code.  Family Communication: Patient's wife at the bedside.  Disposition Plan: Admit to inpatient.    KAKRAKANDY,ARSHAD N. [facility] Pager [phone].  If 7PM-7AM, please contact night-coverage [email or web address] Password [facility] 01/03/2014, 12:47 AM

## 2014-01-03 NOTE — Progress Notes (Addendum)
[facility] PROGRESS NOTE  Johnny [name] [id number]:[id number] DOB: [birth year] DOA: 01/02/2014 PCP: Dwan Bolt, MD  Assessment/Plan: 1-Encephalopathy, acute; in setting of fever. CT head negative. Patient back to baseline.   2-Fever, Leukocytosis; unclear etiology no focal sign of infection. Chest x ray negative. No abdominal pain, dysuria. UA negative for infection, Blood culture pending. Will check ECHO. Increase Alkaline phosphatase will check abdominal US. Continue with antibiotic for now. Repeat chest x ray in am after hydration. WBC trending down.   3-Acute on chronic renal failure. Unclear Cr baseline. Will request records from PCP office. Will check renal US. Continue to hold lasix. Continue with IV fluids. Cr trending down.   4-HTN; continue with current medications.   5-Increase Alk Phosphatase; in setting of fever and leukocytosis will check Abdominal US.   6-CAD status post CABG and aortic valve replacement with pig valve - denies any chest pain or shortness of breath.  7-Diabetes; will decrease lantus dose due to renal failure to avoid hypoglycemia.   Code Status: Full Code.  Family Communication: Care discussed with patient.  Disposition Plan: Remain inpatient.    Consultants:  none  Procedures:  ECHO; pending.  [place] pending.   Antibiotics:  Cefepime 6-25  Vancomycin 6-25  HPI/Subjective: He denies cough, diarrhea, abdominal pain, dysuria.   Objective: Filed Vitals:   01/03/14 0502  BP: 129/54  Pulse: 76  Temp: 98.6 F (37 C)  Resp: 18    Intake/Output Summary (Last 24 hours) at 01/03/14 1438 Last data filed at 01/03/14 0631  Gross per 24 hour  Intake      0 ml  Output   1000 ml  Net  -1000 ml   Filed Weights   01/02/14 2202  Weight: 89 kg (196 lb 3.4 oz)    Exam:   General:  Alert, oriented to place and person.   Cardiovascular: S 1, S 2 RRR  Respiratory: CTA  Abdomen: BS present, obese, NT  Musculoskeletal: no edema.    Data Reviewed: Basic Metabolic Panel:  Recent Labs Lab 01/02/14 2302 01/03/14 0507  NA 137 141  K 3.3* 3.0*  CL 99 105  CO2 20 20  GLUCOSE 232* 219*  BUN 26* 25*  CREATININE 2.52* 2.34*  CALCIUM 9.5 8.8   Liver Function Tests:  Recent Labs Lab 01/02/14 2302  AST 18  ALT 12  ALKPHOS 86  BILITOT 0.8  PROT 7.0  ALBUMIN 3.9   No results found for this basename: LIPASE, AMYLASE,  in the last 168 hours No results found for this basename: AMMONIA,  in the last 168 hours CBC:  Recent Labs Lab 01/02/14 2302 01/03/14 0507  WBC 25.2* 22.6*  NEUTROABS 22.7*  --   HGB 12.2* 11.4*  HCT 38.2* 37.0*  MCV 73.6* 74.3*  PLT 218 189   Cardiac Enzymes:  Recent Labs Lab 01/03/14 0024  CKTOTAL 211   BNP (last 3 results) No results found for this basename: PROBNP,  in the last 8760 hours CBG:  Recent Labs Lab 01/03/14 0746 01/03/14 1205  GLUCAP 158* 228*    No results found for this or any previous visit (from the past 240 hour(s)).   Studies: Dg Chest 2 View  01/02/2014   CLINICAL DATA:  Altered mental status.  Fever.  EXAM: CHEST  2 VIEW  COMPARISON:  01/14/2013  FINDINGS: Postoperative changes in the mediastinum since previous study. Borderline heart size without vascular congestion. No focal airspace disease or consolidation in the lungs. No blunting of costophrenic  angles. No pneumothorax.  IMPRESSION: No active cardiopulmonary disease.   Electronically Signed   By: Lucienne Capers M.D.   On: 01/02/2014 23:33   Ct Head Wo Contrast  01/02/2014   CLINICAL DATA:  Altered mental status. Fever and high blood sugar. Confusion.  EXAM: CT HEAD WITHOUT CONTRAST  TECHNIQUE: Contiguous axial images were obtained from the base of the skull through the vertex without intravenous contrast.  COMPARISON:  MRI brain 09/11/2011  FINDINGS: Diffuse cerebral atrophy. No significant ventricular dilatation. Patchy low-attenuation changes in the deep white matter consistent with small  vessel ischemia. Old lacune or infarct in the right periventricular white matter. No mass effect or midline shift. No abnormal extra-axial fluid collections. Gray-white matter junctions are distinct. Basal cisterns are not effaced. No evidence of acute intracranial hemorrhage. No depressed skull fractures. Visualized paranasal sinuses and mastoid air cells are not opacified.  IMPRESSION: No acute intracranial abnormalities. Chronic atrophy and small vessel ischemic changes.   Electronically Signed   By: Lucienne Capers M.D.   On: 01/02/2014 23:39    Scheduled Meds: . allopurinol  200 mg Oral QHS  . amLODipine  10 mg Oral Daily  . aspirin EC  81 mg Oral Daily  . ceFEPime (MAXIPIME) IV  1 g Intravenous Q24H  . docusate sodium  100 mg Oral BID  . ezetimibe  10 mg Oral QHS  . ferrous fumarate  1 tablet Oral BID  . folic acid  1 mg Oral Daily  . hydrALAZINE  50 mg Oral TID  . insulin aspart  0-9 Units Subcutaneous TID WC  . insulin glargine  10 Units Subcutaneous Daily  . insulin glargine  20 Units Subcutaneous QHS  . metoprolol  50 mg Oral BID  . pantoprazole  40 mg Oral Daily  . prednisoLONE acetate  1 drop Left Eye BID  . senna  1 tablet Oral QHS  . [START ON 01/04/2014] vancomycin  1,000 mg Intravenous Q24H  . [START ON 01/06/2014] Vitamin D (Ergocalciferol)  50,000 Units Oral Q7 days   Continuous Infusions: . sodium chloride 1,000 mL (01/03/14 1140)    Principal Problem:   Acute encephalopathy Active Problems:   Diabetes mellitus   Fever   Aortic valve replaced   CAD in native artery    Time spent: 35 minutes.     Niel Hummer A  [facility] Pager [phone]. If 7PM-7AM, please contact night-coverage at [email or web address], password [facility] 01/03/2014, 2:38 PM  LOS: 1 day

## 2014-01-04 ENCOUNTER — Inpatient Hospital Stay (HOSPITAL_COMMUNITY): Payer: Medicare Other

## 2014-01-04 LAB — CBC WITH DIFFERENTIAL/PLATELET
BASOS PCT: 0 % (ref 0–1)
Basophils Absolute: 0 10*3/uL (ref 0.0–0.1)
EOS ABS: 0.2 10*3/uL (ref 0.0–0.7)
Eosinophils Relative: 1 % (ref 0–5)
HCT: 35 % — ABNORMAL LOW (ref 39.0–52.0)
Hemoglobin: 11 g/dL — ABNORMAL LOW (ref 13.0–17.0)
Lymphocytes Relative: 14 % (ref 12–46)
Lymphs Abs: 1.9 10*3/uL (ref 0.7–4.0)
MCH: 23.3 pg — AB (ref 26.0–34.0)
MCHC: 31.4 g/dL (ref 30.0–36.0)
MCV: 74.2 fL — ABNORMAL LOW (ref 78.0–100.0)
Monocytes Absolute: 1.2 10*3/uL — ABNORMAL HIGH (ref 0.1–1.0)
Monocytes Relative: 9 % (ref 3–12)
NEUTROS ABS: 10.1 10*3/uL — AB (ref 1.7–7.7)
Neutrophils Relative %: 76 % (ref 43–77)
PLATELETS: 175 10*3/uL (ref 150–400)
RBC: 4.72 MIL/uL (ref 4.22–5.81)
RDW: 20.2 % — ABNORMAL HIGH (ref 11.5–15.5)
WBC: 13.5 10*3/uL — ABNORMAL HIGH (ref 4.0–10.5)

## 2014-01-04 LAB — URINE CULTURE: Colony Count: 30000

## 2014-01-04 LAB — BASIC METABOLIC PANEL
BUN: 20 mg/dL (ref 6–23)
BUN: 18 mg/dL (ref 6–23)
CALCIUM: 9.3 mg/dL (ref 8.4–10.5)
CHLORIDE: 102 meq/L (ref 96–112)
CO2: 21 mEq/L (ref 19–32)
CO2: 20 meq/L (ref 19–32)
CREATININE: 1.81 mg/dL — AB (ref 0.50–1.35)
Calcium: 9.4 mg/dL (ref 8.4–10.5)
Chloride: 105 mEq/L (ref 96–112)
Creatinine, Ser: 1.97 mg/dL — ABNORMAL HIGH (ref 0.50–1.35)
GFR calc Af Amer: 39 mL/min — ABNORMAL LOW (ref >90)
GFR calc non Af Amer: 31 mL/min — ABNORMAL LOW (ref >90)
GFR calc non Af Amer: 34 mL/min — ABNORMAL LOW (ref >90)
GFR, EST AFRICAN AMERICAN: 35 mL/min — AB (ref >90)
GLUCOSE: 127 mg/dL — AB (ref 70–99)
Glucose, Bld: 203 mg/dL — ABNORMAL HIGH (ref 70–99)
POTASSIUM: 3.2 meq/L — AB (ref 3.7–5.3)
Potassium: 3.5 mEq/L — ABNORMAL LOW (ref 3.7–5.3)
Sodium: 140 mEq/L (ref 137–147)
Sodium: 136 mEq/L — ABNORMAL LOW (ref 137–147)

## 2014-01-04 LAB — GLUCOSE, CAPILLARY
GLUCOSE-CAPILLARY: 239 mg/dL — AB (ref 70–99)
GLUCOSE-CAPILLARY: 244 mg/dL — AB (ref 70–99)
Glucose-Capillary: 153 mg/dL — ABNORMAL HIGH (ref 70–99)
Glucose-Capillary: 222 mg/dL — ABNORMAL HIGH (ref 70–99)

## 2014-01-04 MED ORDER — FUROSEMIDE 40 MG PO TABS
40.0000 mg | ORAL_TABLET | Freq: Every day | ORAL | Status: DC
  Administered 2014-01-04: 40 mg via ORAL
  Filled 2014-01-04 (×2): qty 1

## 2014-01-04 MED ORDER — POTASSIUM CHLORIDE CRYS ER 20 MEQ PO TBCR
20.0000 meq | EXTENDED_RELEASE_TABLET | Freq: Once | ORAL | Status: AC
Start: 2014-01-04 — End: 2014-01-04
  Administered 2014-01-04: 20 meq via ORAL
  Filled 2014-01-04: qty 1

## 2014-01-04 NOTE — Progress Notes (Signed)
[facility] PROGRESS NOTE  Johnny [name] [id number]:[id number] DOB: [birth year] DOA: 01/02/2014 PCP: Dwan Bolt, MD  Assessment/Plan: 1-Encephalopathy, acute; in setting of fever. CT head negative. Patient back to baseline.   2-Fever, Leukocytosis; unclear etiology no focal sign of infection. Chest x ray negative. No abdominal pain, dysuria. UA negative for infection, Blood culture no growth to date.  ECHO no vegetation.  Repeated chest x ray no infection.  WBC trending down from 25 to 13.   Urine grew Strep, insignificant colonies. Discussed with ID fever might be  related to viral infection. Will observed off antibiotics.   3-Acute on chronic renal failure. Improved with IV fluids. Cr peak to 2.5. Per records last cr at 2.0.  Cr trending down. Resume lasix.   4-HTN; continue with current medications.   5- patient dint have increase in Alk Phosphatase.   6-CAD status post CABG and aortic valve replacement with pig valve - denies any chest pain or shortness of breath.  7-Diabetes; Continue with lantus.   Code Status: Full Code.  Family Communication: Care discussed with patient and wife.  Disposition Plan: Remain inpatient.    Consultants:  none  Procedures: ECHO; - Left ventricle: The cavity size was normal. Systolic function was normal. The estimated ejection fraction was in the range of 55% to 60%. Wall motion was normal; there were no regional wall motion abnormalities. There was an increased relative contribution of atrial contraction to ventricular filling. Doppler parameters are consistent with abnormal left ventricular relaxation (grade 1 diastolic dysfunction). - Aortic valve: The AV is not visulaized enough to comment on leaflet morphology. There was very mild stenosis. - Mitral valve: Calcified annulus. There was mild regurgitation. - Tricuspid valve: There was mild regurgitation.     Antibiotics:  Cefepime 6-25  Vancomycin  6-25  HPI/Subjective: He denies cough, diarrhea, abdominal pain, dysuria.  Feeling well. Wants to go home.   Objective: Filed Vitals:   01/04/14 0601  BP: 147/66  Pulse: 78  Temp: 99.1 F (37.3 C)  Resp: 18    Intake/Output Summary (Last 24 hours) at 01/04/14 1228 Last data filed at 01/04/14 0820  Gross per 24 hour  Intake    557 ml  Output   1675 ml  Net  -1118 ml   Filed Weights   01/02/14 2202  Weight: 89 kg (196 lb 3.4 oz)    Exam:   General:  Alert, oriented to place and person.   Cardiovascular: S 1, S 2 RRR  Respiratory: CTA  Abdomen: BS present, obese, NT  Musculoskeletal: no edema.   Data Reviewed: Basic Metabolic Panel:  Recent Labs Lab 01/02/14 2302 01/03/14 0507 01/04/14 0410  NA 137 141 140  K 3.3* 3.0* 3.2*  CL 99 105 105  CO2 _0 GLUCOSE 232* 219* 127*  BUN 26* 25* 20  CREATININE 2.52* 2.34* 1.97*  CALCIUM 9.5 8.8 9.3   Liver Function Tests:  Recent Labs Lab 01/02/14 2302  AST 18  ALT 12  ALKPHOS 86  BILITOT 0.8  PROT 7.0  ALBUMIN 3.9   No results found for this basename: LIPASE, AMYLASE,  in the last 168 hours No results found for this basename: AMMONIA,  in the last 168 hours CBC:  Recent Labs Lab 01/02/14 2302 01/03/14 0507 01/04/14 0410  WBC 25.2* 22.6* 13.5*  NEUTROABS 22.7*  --  10.1*  HGB 12.2* 11.4* 11.0*  HCT 38.2* 37.0* 35.0*  MCV 73.6* 74.3* 74.2*  PLT 218 189 175  Cardiac Enzymes:  Recent Labs Lab 01/03/14 0024  CKTOTAL 211   BNP (last 3 results) No results found for this basename: PROBNP,  in the last 8760 hours CBG:  Recent Labs Lab 01/03/14 0746 01/03/14 1205 01/03/14 1703 01/03/14 2015 01/04/14 0754  GLUCAP 158* 228* 178* 208* 153*    Recent Results (from the past 240 hour(s))  URINE CULTURE     Status: None   Collection Time    01/02/14 10:36 PM      Result Value Ref Range Status   Specimen Description URINE, CLEAN CATCH   Final   Special Requests NONE   Final    Culture  Setup Time     Final   Value: 01/03/2014 01:12     Performed at [place]     Final   Value: 30,000 COLONIES/ML     Performed at [facility]   Culture     Final   Value: GROUP B STREP(S.AGALACTIAE)ISOLATED     Note: TESTING AGAINST S. AGALACTIAE NOT ROUTINELY PERFORMED DUE TO PREDICTABILITY OF AMP/PEN/VAN SUSCEPTIBILITY.     Performed at [facility]   Report Status 01/04/2014 FINAL   Final  CULTURE, BLOOD (ROUTINE X 2)     Status: None   Collection Time    01/02/14 11:02 PM      Result Value Ref Range Status   Specimen Description BLOOD RIGHT ANTECUBITAL   Final   Special Requests BOTTLES DRAWN AEROBIC AND ANAEROBIC 5ML EACH   Final   Culture  Setup Time     Final   Value: 01/03/2014 03:33     Performed at [facility]   Culture     Final   Value:        BLOOD CULTURE RECEIVED NO GROWTH TO DATE CULTURE WILL BE HELD FOR 5 DAYS BEFORE ISSUING A FINAL NEGATIVE REPORT     Performed at [facility]   Report Status PENDING   Incomplete  CULTURE, BLOOD (ROUTINE X 2)     Status: None   Collection Time    01/02/14 11:54 PM      Result Value Ref Range Status   Specimen Description BLOOD RIGHT WRIST   Final   Special Requests BOTTLES DRAWN AEROBIC AND ANAEROBIC 6CC   Final   Culture  Setup Time     Final   Value: 01/03/2014 03:33     Performed at [facility]   Culture     Final   Value:        BLOOD CULTURE RECEIVED NO GROWTH TO DATE CULTURE WILL BE HELD FOR 5 DAYS BEFORE ISSUING A FINAL NEGATIVE REPORT     Performed at [facility]   Report Status PENDING   Incomplete     Studies: Dg Chest 2 View  01/04/2014   CLINICAL DATA:  Leukocytosis, hypertension  EXAM: CHEST  2 VIEW  COMPARISON:  01/02/2014  FINDINGS: There is no focal parenchymal opacity, pleural effusion, or pneumothorax. The heart and mediastinal contours are unremarkable. There is evidence of prior CABG.  The osseous structures are  unremarkable.  IMPRESSION: No active cardiopulmonary disease.   Electronically Signed   By: Kathreen Devoid   On: 01/04/2014 09:15   Dg Chest 2 View  01/02/2014   CLINICAL DATA:  Altered mental status.  Fever.  EXAM: CHEST  2 VIEW  COMPARISON:  01/14/2013  FINDINGS: Postoperative changes in the mediastinum since previous study. Borderline heart  size without vascular congestion. No focal airspace disease or consolidation in the lungs. No blunting of costophrenic angles. No pneumothorax.  IMPRESSION: No active cardiopulmonary disease.   Electronically Signed   By: Lucienne Capers M.D.   On: 01/02/2014 23:33   Ct Head Wo Contrast  01/02/2014   CLINICAL DATA:  Altered mental status. Fever and high blood sugar. Confusion.  EXAM: CT HEAD WITHOUT CONTRAST  TECHNIQUE: Contiguous axial images were obtained from the base of the skull through the vertex without intravenous contrast.  COMPARISON:  MRI brain 09/11/2011  FINDINGS: Diffuse cerebral atrophy. No significant ventricular dilatation. Patchy low-attenuation changes in the deep white matter consistent with small vessel ischemia. Old lacune or infarct in the right periventricular white matter. No mass effect or midline shift. No abnormal extra-axial fluid collections. Gray-white matter junctions are distinct. Basal cisterns are not effaced. No evidence of acute intracranial hemorrhage. No depressed skull fractures. Visualized paranasal sinuses and mastoid air cells are not opacified.  IMPRESSION: No acute intracranial abnormalities. Chronic atrophy and small vessel ischemic changes.   Electronically Signed   By: Lucienne Capers M.D.   On: 01/02/2014 23:39    Scheduled Meds: . allopurinol  200 mg Oral QHS  . amLODipine  10 mg Oral Daily  . aspirin EC  81 mg Oral Daily  . docusate sodium  100 mg Oral BID  . ezetimibe  10 mg Oral QHS  . ferrous fumarate  1 tablet Oral BID  . folic acid  1 mg Oral Daily  . hydrALAZINE  50 mg Oral TID  . insulin aspart  0-9  Units Subcutaneous TID WC  . insulin glargine  10 Units Subcutaneous Daily  . insulin glargine  20 Units Subcutaneous QHS  . metoprolol  50 mg Oral BID  . pantoprazole  40 mg Oral Daily  . prednisoLONE acetate  1 drop Left Eye BID  . senna  1 tablet Oral QHS  . [START ON 01/06/2014] Vitamin D (Ergocalciferol)  50,000 Units Oral Q7 days   Continuous Infusions:    Principal Problem:   Acute encephalopathy Active Problems:   Diabetes mellitus   Fever   Aortic valve replaced   CAD in native artery    Time spent: 35 minutes.     Niel Hummer A  [facility] Pager [phone]. If 7PM-7AM, please contact night-coverage at [email or web address], password [facility] 01/04/2014, 12:28 PM  LOS: 2 days

## 2014-01-05 LAB — GLUCOSE, CAPILLARY: Glucose-Capillary: 135 mg/dL — ABNORMAL HIGH (ref 70–99)

## 2014-01-05 MED ORDER — POTASSIUM CHLORIDE CRYS ER 20 MEQ PO TBCR
40.0000 meq | EXTENDED_RELEASE_TABLET | Freq: Once | ORAL | Status: DC
  Filled 2014-01-05: qty 2

## 2014-01-05 MED ORDER — INSULIN GLARGINE 100 UNIT/ML ~~LOC~~ SOLN: 13.0000 [IU] | Freq: Two times a day (BID) | SUBCUTANEOUS | Status: DC

## 2014-01-05 NOTE — Discharge Summary (Signed)
Physician Discharge Summary  Johnny [name] QPY:[id number] DOB: [birth year] DOA: 01/02/2014  PCP: Dwan Bolt, MD  Admit date: 01/02/2014 Discharge date: 01/05/2014  Time spent: 35 minutes  Recommendations for Outpatient Follow-up:  1-needs repeat CBC to follow WBC.  2-Please follow up final blood culture results.  3-b-met to follow renal function.   Discharge Diagnoses:    Acute encephalopathy   Fever, leukocytosis, question viral illness.    Diabetes mellitus   Acute on chronic renal failure.    Fever   Aortic valve replaced   CAD in native artery   Discharge Condition: Improved.   Diet recommendation: Carb Modified.   Filed Weights   01/02/14 2202  Weight: 89 kg (196 lb 3.4 oz)    History of present illness:  Johnny [name] is a 80 y.o. male with history of aortic valve replacement (Pig valve), CAD status post CABG, diabetes mellitus, history of CVA, hypertension, chronic kidney disease last evening around 7 PM while eating pizza patient suddenly became confused and was having rigors. His wife checked his temperature it is more than 100F. Since his confusion persisted EMS was called and patient was brought to the ER. In the ER patient's temperature was found to be around 101.71F with leukocytosis. By the time patient is in the ER patient's confusion has resolved and patient is presently alert awake oriented to time place person and moves all extremities. Patient denies any headache neck pain chest pain shortness of breath productive cough nausea vomiting abdominal pain diarrhea. Patient has no focal deficits. CT head is negative. Patient did receive fluid bolus. Blood cultures have been obtained along with CK levels sedimentation rate. Patient's aortic valve replacement was last September. Patient presently is not in acute distress. Patient's wife states that over the last 2 weeks their house air-conditioning was not working. Patient was just restarted back on Glyxambi for  diabetes last week. There is no reported hypoglycemic episodes. Patient did not lose consciousness.   Hospital Course:  1-Encephalopathy, acute; in setting of fever. CT head negative. Patient back to baseline.   2-Fever, Leukocytosis; unclear etiology no focal sign of infection. Chest x ray negative. No abdominal pain, dysuria. UA negative for infection, Blood culture no growth to date. ECHO no vegetation. Repeated chest x ray no infection. WBC trending down from 25 to 13. Urine grew Strep, insignificant colonies. Discussed with ID fever might be related to viral infection. Will observed off antibiotics.  Patient continue to be afebrile. Patient was advised to return if he develops fever.   3-Acute on chronic renal failure. Improved with IV fluids. Cr peak to 2.5. Per records last cr at 1.8 to 2.0. Cr trending down. Resume lasix.   4-HTN; continue with current medications.   5-CAD status post CABG and aortic valve replacement with pig valve - denies any chest pain or shortness of breath.  6-Diabetes; Continue with lantus. His insulin was adjust. He will be discharge on 13 units in am and 25 units at bedtime. He needs to work on diet.    Procedures: ECHO: Left ventricle: The cavity size was normal. Systolic function was normal. The estimated ejection fraction was in the range of 55% to 60%. Wall motion was normal; there were no regional wall motion abnormalities. There was an increased relative contribution of atrial contraction to ventricular filling. Doppler parameters are consistent with abnormal left ventricular relaxation (grade 1 diastolic dysfunction). - Aortic valve: The AV is not visulaized enough to comment on leaflet morphology. There was  very mild stenosis. - Mitral valve: Calcified annulus. There was mild regurgitation. - Tricuspid valve: There was mild regurgitation.    Consultations:  ID, phone conversation.   Discharge Exam: Filed Vitals:   01/05/14 0624  BP:  151/78  Pulse: 64  Temp: 98.3 F (36.8 C)  Resp: 16    General: No distress.  Cardiovascular: S 1, S 2 RRR Respiratory: CTA  Discharge Instructions You were cared for by a hospitalist during your hospital stay. If you have any questions about your discharge medications or the care you received while you were in the hospital after you are discharged, you can call the unit and asked to speak with the hospitalist on call if the hospitalist that took care of you is not available. Once you are discharged, your primary care physician will handle any further medical issues. Please note that NO REFILLS for any discharge medications will be authorized once you are discharged, as it is imperative that you return to your primary care physician (or establish a relationship with a primary care physician if you do not have one) for your aftercare needs so that they can reassess your need for medications and monitor your lab values.  Discharge Instructions   Diet Carb Modified    Complete by:  As directed      Increase activity slowly    Complete by:  As directed             Medication List         allopurinol 100 MG tablet  Commonly known as:  ZYLOPRIM  Take 200 mg by mouth at bedtime.     amLODipine 10 MG tablet  Commonly known as:  NORVASC  Take 10 mg by mouth daily.     aspirin EC 81 MG tablet  Take 81 mg by mouth daily.     docusate sodium 100 MG capsule  Commonly known as:  COLACE  Take 100 mg by mouth 2 (two) times daily.     ezetimibe 10 MG tablet  Commonly known as:  ZETIA  Take 10 mg by mouth at bedtime.     ferrous fumarate 325 (106 FE) MG Tabs tablet  Commonly known as:  HEMOCYTE - 106 mg FE  Take 1 tablet by mouth 2 (two) times daily.     folic acid 1 MG tablet  Commonly known as:  FOLVITE  Take 1 mg by mouth daily.     furosemide 40 MG tablet  Commonly known as:  LASIX  Take 40 mg by mouth 2 (two) times daily.     GLYXAMBI 25-5 MG Tabs  Generic drug:   Empagliflozin-Linagliptin  Take 1 tablet by mouth daily.     hydrALAZINE 50 MG tablet  Commonly known as:  APRESOLINE  Take 50 mg by mouth 3 (three) times daily.     insulin glargine 100 UNIT/ML injection  Commonly known as:  LANTUS  Inject 0.13-0.25 mLs (13-25 Units total) into the skin 2 (two) times daily. 13 units in the morning and 25 units at bedtime.     metoprolol 50 MG tablet  Commonly known as:  LOPRESSOR  Take 50 mg by mouth 2 (two) times daily.     omeprazole-sodium bicarbonate 40-1100 MG per capsule  Commonly known as:  ZEGERID  Take 1 capsule by mouth daily before breakfast.     prednisoLONE acetate 1 % ophthalmic suspension  Commonly known as:  PRED FORTE  Place 1 drop into the left eye 2 (two)  times daily.     senna 8.6 MG Tabs tablet  Commonly known as:  SENOKOT  Take 1 tablet by mouth at bedtime.     Vitamin D (Ergocalciferol) 50000 UNITS Caps capsule  Commonly known as:  DRISDOL  Take 50,000 Units by mouth every 7 (seven) days. Once a week for 4 weeks then once per month for 6 months       Allergies  Allergen Reactions  . Heparin     Other reaction(s): Heparin-Induced Thrombocytopenia       Follow-up Information   Follow up with Dwan Bolt, MD.   Specialty:  Endocrinology   Contact information:   [address] [place] [address] [phone]        The results of significant diagnostics from this hospitalization (including imaging, microbiology, ancillary and laboratory) are listed below for reference.    Significant Diagnostic Studies: Dg Chest 2 View  01/04/2014   CLINICAL DATA:  Leukocytosis, hypertension  EXAM: CHEST  2 VIEW  COMPARISON:  01/02/2014  FINDINGS: There is no focal parenchymal opacity, pleural effusion, or pneumothorax. The heart and mediastinal contours are unremarkable. There is evidence of prior CABG.  The osseous structures are unremarkable.  IMPRESSION: No active cardiopulmonary disease.    Electronically Signed   By: Kathreen Devoid   On: 01/04/2014 09:15   Dg Chest 2 View  01/02/2014   CLINICAL DATA:  Altered mental status.  Fever.  EXAM: CHEST  2 VIEW  COMPARISON:  01/14/2013  FINDINGS: Postoperative changes in the mediastinum since previous study. Borderline heart size without vascular congestion. No focal airspace disease or consolidation in the lungs. No blunting of costophrenic angles. No pneumothorax.  IMPRESSION: No active cardiopulmonary disease.   Electronically Signed   By: Lucienne Capers M.D.   On: 01/02/2014 23:33   Ct Head Wo Contrast  01/02/2014   CLINICAL DATA:  Altered mental status. Fever and high blood sugar. Confusion.  EXAM: CT HEAD WITHOUT CONTRAST  TECHNIQUE: Contiguous axial images were obtained from the base of the skull through the vertex without intravenous contrast.  COMPARISON:  MRI brain 09/11/2011  FINDINGS: Diffuse cerebral atrophy. No significant ventricular dilatation. Patchy low-attenuation changes in the deep white matter consistent with small vessel ischemia. Old lacune or infarct in the right periventricular white matter. No mass effect or midline shift. No abnormal extra-axial fluid collections. Gray-white matter junctions are distinct. Basal cisterns are not effaced. No evidence of acute intracranial hemorrhage. No depressed skull fractures. Visualized paranasal sinuses and mastoid air cells are not opacified.  IMPRESSION: No acute intracranial abnormalities. Chronic atrophy and small vessel ischemic changes.   Electronically Signed   By: Lucienne Capers M.D.   On: 01/02/2014 23:39    Microbiology: Recent Results (from the past 240 hour(s))  URINE CULTURE     Status: None   Collection Time    01/02/14 10:36 PM      Result Value Ref Range Status   Specimen Description URINE, CLEAN CATCH   Final   Special Requests NONE   Final   Culture  Setup Time     Final   Value: 01/03/2014 01:12     Performed at [facility]      Final   Value: 30,000 COLONIES/ML     Performed at [facility]   Culture     Final   Value: GROUP B STREP(S.AGALACTIAE)ISOLATED     Note: TESTING AGAINST S. AGALACTIAE NOT ROUTINELY PERFORMED  DUE TO PREDICTABILITY OF AMP/PEN/VAN SUSCEPTIBILITY.     Performed at [facility]   Report Status 01/04/2014 FINAL   Final  CULTURE, BLOOD (ROUTINE X 2)     Status: None   Collection Time    01/02/14 11:02 PM      Result Value Ref Range Status   Specimen Description BLOOD RIGHT ANTECUBITAL   Final   Special Requests BOTTLES DRAWN AEROBIC AND ANAEROBIC 5ML EACH   Final   Culture  Setup Time     Final   Value: 01/03/2014 03:33     Performed at [facility]   Culture     Final   Value:        BLOOD CULTURE RECEIVED NO GROWTH TO DATE CULTURE WILL BE HELD FOR 5 DAYS BEFORE ISSUING A FINAL NEGATIVE REPORT     Performed at [facility]   Report Status PENDING   Incomplete  CULTURE, BLOOD (ROUTINE X 2)     Status: None   Collection Time    01/02/14 11:54 PM      Result Value Ref Range Status   Specimen Description BLOOD RIGHT WRIST   Final   Special Requests BOTTLES DRAWN AEROBIC AND ANAEROBIC 6CC   Final   Culture  Setup Time     Final   Value: 01/03/2014 03:33     Performed at [facility]   Culture     Final   Value:        BLOOD CULTURE RECEIVED NO GROWTH TO DATE CULTURE WILL BE HELD FOR 5 DAYS BEFORE ISSUING A FINAL NEGATIVE REPORT     Performed at [facility]   Report Status PENDING   Incomplete     Labs: Basic Metabolic Panel:  Recent Labs Lab 01/02/14 2302 01/03/14 0507 01/04/14 0410 01/04/14 1310  NA 137 141 140 136*  K 3.3* 3.0* 3.2* 3.5*  CL 99 105 105 102  CO2 '20 20 21 20  ' GLUCOSE 232* 219* 127* 203*  BUN 26* 25* 20 18  CREATININE 2.52* 2.34* 1.97* 1.81*  CALCIUM 9.5 8.8 9.3 9.4   Liver Function Tests:  Recent Labs Lab 01/02/14 2302  AST 18  ALT 12  ALKPHOS 86  BILITOT 0.8  PROT 7.0  ALBUMIN 3.9   No results  found for this basename: LIPASE, AMYLASE,  in the last 168 hours No results found for this basename: AMMONIA,  in the last 168 hours CBC:  Recent Labs Lab 01/02/14 2302 01/03/14 0507 01/04/14 0410  WBC 25.2* 22.6* 13.5*  NEUTROABS 22.7*  --  10.1*  HGB 12.2* 11.4* 11.0*  HCT 38.2* 37.0* 35.0*  MCV 73.6* 74.3* 74.2*  PLT 218 189 175   Cardiac Enzymes:  Recent Labs Lab 01/03/14 0024  CKTOTAL 211   BNP: BNP (last 3 results) No results found for this basename: PROBNP,  in the last 8760 hours CBG:  Recent Labs Lab 01/04/14 0754 01/04/14 1210 01/04/14 1650 01/04/14 2008 01/05/14 0726  GLUCAP 153* 222* 239* 244* 135*       Signed:  Regalado, Belkys A  [facility] 01/05/2014, 10:15 AM

## 2014-01-05 NOTE — Evaluation (Signed)
Occupational Therapy Evaluation Patient Details Name: Johnny [name] MRN: [id number] DOB: 01/22/1933 Today's Date: 01/05/2014    History of Present Illness Johnny [name] is a 80 y.o. male with history of aortic valve replacement (Pig valve), CAD status post CABG, diabetes mellitus, history of CVA, hypertension, chronic kidney disease at home becoming confused and was having rigors   Clinical Impression   Pt is at baseline level of function with ADLs and ADL mobility. Pt with decreased dynamic balance during functional mobility/ambulation, requiring min guard A with ADLs sit -stand/standing. Pt has a cane at home but does not us it per he and wife's report. Pt encouraged to use cane as his balance is not Good. No further acute OT services are indicated at this time    Follow Up Recommendations  No OT follow up    Equipment Recommendations  None recommended by OT    Recommendations for Other Services PT consult     Precautions / Restrictions Restrictions Weight Bearing Restrictions: No      Mobility Bed Mobility Overal bed mobility: Needs Assistance Bed Mobility: Supine to Sit;Sit to Supine     Supine to sit: Supervision Sit to supine: Modified independent (Device/Increase time)      Transfers Overall transfer level: Needs assistance Equipment used: 1 person hand held assist Transfers: Sit to/from Stand Sit to Stand: Min guard         General transfer comment: Pt with decreased dynamic balance duirng functional mobility/ambulation; pt and his wife state that this is his baseline and he has a cane at home but does not use it    Balance Overall balance assessment: Needs assistance Sitting-balance support: No upper extremity supported;Feet supported Sitting balance-Leahy Scale: Good     Standing balance support: Single extremity supported;Bilateral upper extremity supported;During functional activity Standing balance-Leahy Scale: Fair Standing balance comment:  decreased dynamic standing balance                            ADL Overall ADL's : At baseline                                       General ADL Comments: pt performs LB dressing seated and then stands holding onto bed prn, baseline function per pt and his wife, min guard A at sink. Min guard A transfer to toilet and shower with cues to use grab  bars     Vision  wears glasses; macular degeneration, retinopathy                   Perception Perception Perception Tested?: No   Praxis Praxis Praxis tested?: Not tested    Pertinent Vitals/Pain No c/o pain, VSS     Hand Dominance Right   Extremity/Trunk Assessment Upper Extremity Assessment Upper Extremity Assessment: Overall WFL for tasks assessed   Lower Extremity Assessment Lower Extremity Assessment: Defer to PT evaluation   Cervical / Trunk Assessment Cervical / Trunk Assessment: Kyphotic   Communication Communication Communication: No difficulties   Cognition Arousal/Alertness: Awake/alert Behavior During Therapy: WFL for tasks assessed/performed Overall Cognitive Status: Within Functional Limits for tasks assessed                     General Comments   Pt very pleasant and cooperative  Home Living Family/patient expects to be discharged to:: Private residence Living Arrangements: Spouse/significant other Available Help at Discharge: Family Type of Home: House Home Access: Stairs to enter     Home Layout: One level     Bathroom Shower/Tub: Walk-in shower Shower/tub characteristics: Engineer, building servicesCurtain Bathroom Toilet: Standard     Home Equipment: Environmental consultantWalker - 2 wheels;Bedside commode;Wheelchair - manual;Cane - single point;Shower seat - built in;Hand held shower head;Adaptive equipment;Grab bars - tub/shower Adaptive Equipment: Reacher        Prior Functioning/Environment  Mod I            OT Diagnosis:     OT Problem List:     OT  Treatment/Interventions:      OT Goals(Current goals can be found in the care plan section) Acute Rehab OT Goals Patient Stated Goal: go home today OT Goal Formulation: With patient/family  OT Frequency:     Barriers to D/C:  none                        End of Session Equipment Utilized During Treatment: Gait belt  Activity Tolerance: Patient tolerated treatment well Patient left: in bed;with call bell/phone within reach;with family/visitor present   Time: 1000-1029 OT Time Calculation (min): 29 min Charges:  OT General Charges $OT Visit: 1 Procedure OT Evaluation $Initial OT Evaluation Tier I: 1 Procedure OT Treatments $Therapeutic Activity: 8-22 mins G-Codes:    Galen ManilaSpencer, Denise Jeanette 01/05/2014, 11:32 AM

## 2014-01-09 LAB — CULTURE, BLOOD (ROUTINE X 2): CULTURE: NO GROWTH

## 2014-01-13 LAB — CULTURE, BLOOD (ROUTINE X 2): CULTURE: NO GROWTH

## 2014-01-25 ENCOUNTER — Encounter: Payer: Self-pay | Admitting: Podiatry

## 2014-01-25 ENCOUNTER — Ambulatory Visit (INDEPENDENT_AMBULATORY_CARE_PROVIDER_SITE_OTHER): Payer: BLUE CROSS/BLUE SHIELD | Admitting: Podiatry

## 2014-01-25 VITALS — BP 167/80 | HR 77

## 2014-01-25 DIAGNOSIS — M79606 Pain in leg, unspecified: Secondary | ICD-10-CM

## 2014-01-25 DIAGNOSIS — B351 Tinea unguium: Secondary | ICD-10-CM

## 2014-01-25 DIAGNOSIS — M79609 Pain in unspecified limb: Secondary | ICD-10-CM

## 2014-01-25 NOTE — Progress Notes (Signed)
Subjective:  80 y.o. year old male patient presents requesting toe nails trimmed.  Stated that his eyes are getting worse. He is still not able to see well.   Objective: Dermatologic:  Thick dystrophic nails x 10.  Ingrown nails with thick dystrophic nails on both great toe.  Vascular:  All pedal pulses are palpable.  No edema or erythema noted.  Orthopedic:  Hallux valgus with bunion bilateral.  Neurologic:  Decreased sensory perception bilateral.   Assessment:  Dystrophic mycotic nails x 10.  TYpe II IDDM under control.  Poor vision.  Treatment: All mycotic nails debrided.  Return in 3 months or as needed.

## 2014-01-25 NOTE — Patient Instructions (Signed)
Seen for hypertrophic nails. All nails debrided. Return in 3 months or as needed.  

## 2014-04-26 ENCOUNTER — Ambulatory Visit (INDEPENDENT_AMBULATORY_CARE_PROVIDER_SITE_OTHER): Payer: BLUE CROSS/BLUE SHIELD | Admitting: Podiatry

## 2014-04-26 ENCOUNTER — Encounter: Payer: Self-pay | Admitting: Podiatry

## 2014-04-26 DIAGNOSIS — B351 Tinea unguium: Secondary | ICD-10-CM

## 2014-04-26 DIAGNOSIS — M79606 Pain in leg, unspecified: Secondary | ICD-10-CM

## 2014-04-26 NOTE — Patient Instructions (Signed)
Seen for hypertrophic nails. All nails debrided. Return in 3 months or as needed.  

## 2014-04-26 NOTE — Progress Notes (Signed)
Subjective:  80 y.o. year old male patient presents requesting toe nails trimmed. Both feet hurt from ingrown nails.  Stated that his eyes are little better. He can see like shadows.   Objective: Dermatologic: Thick dystrophic nails x 10.  Ingrown nails with thick dystrophic nails on both great toe.  Vascular: All pedal pulses are palpable.  No edema or erythema noted.  Orthopedic: Hallux valgus with bunion bilateral.  Neurologic: Decreased sensory perception bilateral.   Assessment:  Dystrophic mycotic nails x 10.  TYpe II IDDM under control.  Poor vision. Painful feet.   Plan: All nails debrided. Return in 3 months.

## 2014-07-26 ENCOUNTER — Ambulatory Visit (INDEPENDENT_AMBULATORY_CARE_PROVIDER_SITE_OTHER): Payer: BLUE CROSS/BLUE SHIELD | Admitting: Podiatry

## 2014-07-26 ENCOUNTER — Encounter: Payer: Self-pay | Admitting: Podiatry

## 2014-07-26 VITALS — BP 184/81 | HR 65

## 2014-07-26 DIAGNOSIS — M21619 Bunion of unspecified foot: Secondary | ICD-10-CM | POA: Insufficient documentation

## 2014-07-26 DIAGNOSIS — B351 Tinea unguium: Secondary | ICD-10-CM

## 2014-07-26 DIAGNOSIS — E118 Type 2 diabetes mellitus with unspecified complications: Secondary | ICD-10-CM

## 2014-07-26 DIAGNOSIS — E1169 Type 2 diabetes mellitus with other specified complication: Secondary | ICD-10-CM

## 2014-07-26 DIAGNOSIS — R269 Unspecified abnormalities of gait and mobility: Secondary | ICD-10-CM

## 2014-07-26 DIAGNOSIS — M2010 Hallux valgus (acquired), unspecified foot: Secondary | ICD-10-CM

## 2014-07-26 DIAGNOSIS — M79606 Pain in leg, unspecified: Secondary | ICD-10-CM

## 2014-07-26 NOTE — Patient Instructions (Signed)
Seen for hypertrophic nails, difficulty walking. All nails debrided. Noted of worn out heel on both side. Both feet measured for a new pair of diabetic shoes.  Return in 3 months or as needed.

## 2014-07-26 NOTE — Progress Notes (Signed)
Subjective:  81 y.o. year old male patient presents requesting toe nails trimmed. Stated that his eye sights got worse. Also having difficulty walking. Feet are rolling off to side.   Objective: Dermatologic: Thick dystrophic nails x 10.  Vascular: All pedal pulses are palpable.  No edema or erythema noted.  Orthopedic: Hallux valgus with bunion bilateral.  Neurologic: Decreased sensory perception bilateral.   Assessment:  Dystrophic mycotic nails x 10.  TYpe II IDDM under control.  Poor vision. HAV with bunion. Difficulty walking.  Shoes are worn out on lateral heel.  Painful feet.   Plan: All nails debrided. Reviewed benefit of new pair diabetic shoes. Both feet measured for diabetic shoes.  Return in 3 months.

## 2014-10-25 ENCOUNTER — Encounter: Payer: Self-pay | Admitting: Podiatry

## 2014-10-25 ENCOUNTER — Ambulatory Visit (INDEPENDENT_AMBULATORY_CARE_PROVIDER_SITE_OTHER): Payer: Medicare Other | Admitting: Podiatry

## 2014-10-25 VITALS — BP 153/75 | HR 64

## 2014-10-25 DIAGNOSIS — M79606 Pain in leg, unspecified: Secondary | ICD-10-CM

## 2014-10-25 DIAGNOSIS — B351 Tinea unguium: Secondary | ICD-10-CM | POA: Diagnosis not present

## 2014-10-25 NOTE — Progress Notes (Signed)
Subjective:  82 y.o. year old male patient presents requesting toe nails trimmed.  No new problems other than having difficulty with eye sight.  Objective: Dermatologic: Thick dystrophic nails x 10.  Ingrown nails with thick dystrophic nails on both great toe.  Vascular: All pedal pulses are palpable.  No edema or erythema noted.  Orthopedic: Hallux valgus with bunion bilateral.  Neurologic: Decreased sensory perception bilateral.   Assessment:  Dystrophic mycotic nails x 10.  TYpe II IDDM under control.  Poor vision. Painful feet.   Plan: All nails debrided. Return in 3 months.  

## 2014-10-25 NOTE — Patient Instructions (Signed)
Seen for hypertrophic nails. All nails debrided. Return in 3 months or as needed.  

## 2015-01-24 ENCOUNTER — Ambulatory Visit (INDEPENDENT_AMBULATORY_CARE_PROVIDER_SITE_OTHER): Payer: Medicare Other | Admitting: Podiatry

## 2015-01-24 ENCOUNTER — Encounter: Payer: Self-pay | Admitting: Podiatry

## 2015-01-24 VITALS — BP 177/81 | HR 70

## 2015-01-24 DIAGNOSIS — B351 Tinea unguium: Secondary | ICD-10-CM | POA: Diagnosis not present

## 2015-01-24 DIAGNOSIS — M79606 Pain in leg, unspecified: Secondary | ICD-10-CM | POA: Diagnosis not present

## 2015-01-24 NOTE — Progress Notes (Signed)
Subjective:  81 y.o. year old male patient presents requesting toe nails trimmed.  No new problems other than having difficulty with eye sight.  Objective: Dermatologic: Thick dystrophic nails x 10.  Ingrown nails with thick dystrophic nails on both great toe.  Vascular: All pedal pulses are palpable.  No edema or erythema noted.  Orthopedic: Hallux valgus with bunion bilateral.  Neurologic: Decreased sensory perception bilateral.   Assessment:  Dystrophic mycotic nails x 10.  TYpe II IDDM under control.  Poor vision. Painful feet.   Plan: All nails debrided. Return in 3 months.

## 2015-01-24 NOTE — Patient Instructions (Signed)
Seen for hypertrophic nails. All nails debrided. Return in 3 months or as needed.  

## 2015-04-24 ENCOUNTER — Encounter: Payer: Self-pay | Admitting: Podiatry

## 2015-04-24 ENCOUNTER — Ambulatory Visit (INDEPENDENT_AMBULATORY_CARE_PROVIDER_SITE_OTHER): Payer: Medicare Other | Admitting: Podiatry

## 2015-04-24 VITALS — BP 183/77 | HR 66

## 2015-04-24 DIAGNOSIS — M79606 Pain in leg, unspecified: Secondary | ICD-10-CM

## 2015-04-24 DIAGNOSIS — B351 Tinea unguium: Secondary | ICD-10-CM | POA: Diagnosis not present

## 2015-04-24 NOTE — Patient Instructions (Signed)
Seen for hypertrophic nails. All nails debrided. Return in 3 months or as needed.  

## 2015-04-24 NOTE — Progress Notes (Signed)
Subjective:  81 y.o. year old male patient presents requesting toe nails trimmed. Ambulation assisted using cane.  No new problems other than having difficulty with eye sight.  Objective: Dermatologic: Thick dystrophic nails x 10.  Ingrown nails with thick dystrophic nails on both great toe.  Vascular: All pedal pulses are palpable.  No edema or erythema noted.  Orthopedic: Hallux valgus with bunion bilateral.  Neurologic: Decreased sensory perception bilateral.   Assessment:  Dystrophic mycotic nails x 10.  TYpe II IDDM under control.  Poor vision. Painful feet.   Plan: All nails debrided. Return in 3 months

## 2015-04-25 ENCOUNTER — Ambulatory Visit: Payer: Medicare Other | Admitting: Podiatry

## 2015-05-27 ENCOUNTER — Ambulatory Visit
Admission: RE | Admit: 2015-05-27 | Discharge: 2015-05-27 | Disposition: A | Discharge status: Home / Self Care | Payer: Medicare Other | Source: Ambulatory Visit | Attending: Physical Medicine and Rehabilitation | Admitting: Physical Medicine and Rehabilitation

## 2015-05-27 ENCOUNTER — Other Ambulatory Visit: Payer: Self-pay | Admitting: Physical Medicine and Rehabilitation

## 2015-05-27 DIAGNOSIS — M25551 Pain in right hip: Secondary | ICD-10-CM

## 2015-05-27 DIAGNOSIS — M7918 Myalgia, other site: Secondary | ICD-10-CM

## 2015-07-24 ENCOUNTER — Encounter: Payer: Self-pay | Admitting: Podiatry

## 2015-07-24 ENCOUNTER — Ambulatory Visit (INDEPENDENT_AMBULATORY_CARE_PROVIDER_SITE_OTHER): Payer: Medicare Other | Admitting: Podiatry

## 2015-07-24 VITALS — BP 166/75 | HR 66

## 2015-07-24 DIAGNOSIS — B351 Tinea unguium: Secondary | ICD-10-CM

## 2015-07-24 DIAGNOSIS — M79606 Pain in leg, unspecified: Secondary | ICD-10-CM | POA: Diagnosis not present

## 2015-07-24 NOTE — Progress Notes (Signed)
Subjective:  82 y.o. year old male patient presents aided by a cane requesting toe nails trimmed. He was accompanied by his wife. Stated that bottom of both feet hurt when walk. Having difficulty with eye sight.  Objective: Dermatologic: Thick dystrophic nails x 10.  Ingrown nails with thick dystrophic nails on both great toe.  Vascular: All pedal pulses are palpable.  No edema or erythema noted.  Orthopedic: Hallux valgus with bunion bilateral.  Neurologic: Decreased sensory perception bilateral.   Assessment:  Dystrophic mycotic nails x 10.  Ingrown nails debrided. Cleansed with Iodine and Amerigel ointment applied. TYpe II IDDM under control.  Poor vision. Painful feet.   Plan: All nails debrided. Return in 3 months

## 2015-07-24 NOTE — Patient Instructions (Signed)
Seen for hypertrophic nails. All nails debrided. Return in 3 months or as needed.  

## 2015-08-13 DIAGNOSIS — E119 Type 2 diabetes mellitus without complications: Secondary | ICD-10-CM | POA: Diagnosis not present

## 2015-08-13 DIAGNOSIS — E7889 Other lipoprotein metabolism disorders: Secondary | ICD-10-CM | POA: Diagnosis not present

## 2015-08-13 DIAGNOSIS — I10 Essential (primary) hypertension: Secondary | ICD-10-CM | POA: Diagnosis not present

## 2015-08-20 DIAGNOSIS — E790 Hyperuricemia without signs of inflammatory arthritis and tophaceous disease: Secondary | ICD-10-CM | POA: Diagnosis not present

## 2015-08-20 DIAGNOSIS — E118 Type 2 diabetes mellitus with unspecified complications: Secondary | ICD-10-CM | POA: Diagnosis not present

## 2015-08-20 DIAGNOSIS — I10 Essential (primary) hypertension: Secondary | ICD-10-CM | POA: Diagnosis not present

## 2015-08-20 DIAGNOSIS — I359 Nonrheumatic aortic valve disorder, unspecified: Secondary | ICD-10-CM | POA: Diagnosis not present

## 2015-09-09 DIAGNOSIS — T8521XA Breakdown (mechanical) of intraocular lens, initial encounter: Secondary | ICD-10-CM | POA: Insufficient documentation

## 2015-09-09 DIAGNOSIS — H40052 Ocular hypertension, left eye: Secondary | ICD-10-CM | POA: Insufficient documentation

## 2015-09-09 DIAGNOSIS — H353232 Exudative age-related macular degeneration, bilateral, with inactive choroidal neovascularization: Secondary | ICD-10-CM | POA: Diagnosis not present

## 2015-09-09 DIAGNOSIS — H35033 Hypertensive retinopathy, bilateral: Secondary | ICD-10-CM | POA: Diagnosis not present

## 2015-09-09 DIAGNOSIS — Z961 Presence of intraocular lens: Secondary | ICD-10-CM | POA: Diagnosis not present

## 2015-09-16 DIAGNOSIS — Z961 Presence of intraocular lens: Secondary | ICD-10-CM | POA: Diagnosis not present

## 2015-09-16 DIAGNOSIS — H35033 Hypertensive retinopathy, bilateral: Secondary | ICD-10-CM | POA: Diagnosis not present

## 2015-09-16 DIAGNOSIS — T8521XA Breakdown (mechanical) of intraocular lens, initial encounter: Secondary | ICD-10-CM | POA: Diagnosis not present

## 2015-09-16 DIAGNOSIS — H353232 Exudative age-related macular degeneration, bilateral, with inactive choroidal neovascularization: Secondary | ICD-10-CM | POA: Diagnosis not present

## 2015-10-14 DIAGNOSIS — H40052 Ocular hypertension, left eye: Secondary | ICD-10-CM | POA: Diagnosis not present

## 2015-10-14 DIAGNOSIS — H353232 Exudative age-related macular degeneration, bilateral, with inactive choroidal neovascularization: Secondary | ICD-10-CM | POA: Diagnosis not present

## 2015-10-14 DIAGNOSIS — H35033 Hypertensive retinopathy, bilateral: Secondary | ICD-10-CM | POA: Diagnosis not present

## 2015-10-14 DIAGNOSIS — T8521XA Breakdown (mechanical) of intraocular lens, initial encounter: Secondary | ICD-10-CM | POA: Diagnosis not present

## 2015-10-14 DIAGNOSIS — Z961 Presence of intraocular lens: Secondary | ICD-10-CM | POA: Diagnosis not present

## 2015-10-22 ENCOUNTER — Ambulatory Visit (INDEPENDENT_AMBULATORY_CARE_PROVIDER_SITE_OTHER): Payer: Medicare Other | Admitting: Podiatry

## 2015-10-22 ENCOUNTER — Encounter: Payer: Self-pay | Admitting: Podiatry

## 2015-10-22 VITALS — BP 150/72 | HR 62

## 2015-10-22 DIAGNOSIS — M79606 Pain in leg, unspecified: Secondary | ICD-10-CM | POA: Diagnosis not present

## 2015-10-22 DIAGNOSIS — B351 Tinea unguium: Secondary | ICD-10-CM | POA: Diagnosis not present

## 2015-10-22 NOTE — Patient Instructions (Signed)
Seen for hypertrophic nails. All nails debrided. Return in 3 months or as needed.  

## 2015-10-22 NOTE — Progress Notes (Signed)
Subjective:  83 y.o. year old male patient presents aided by a cane requesting toe nails trimmed. He was accompanied by his wife. Have poor eye sight.   Objective: Dermatologic: Thick dystrophic nails x 10.  Ingrown nails with thick dystrophic nails on both great toe.  Vascular: All pedal pulses are palpable.  No edema or erythema noted.  Orthopedic: Hallux valgus with bunion bilateral.  Neurologic: Decreased sensory perception bilateral.   Assessment:  Dystrophic mycotic nails x 10.  Ingrown nails debrided. Cleansed with Iodine and Amerigel ointment applied. TYpe II IDDM under control.  Poor vision. Painful feet.   Plan: All nails debrided. Return in 3 months 

## 2015-11-12 DIAGNOSIS — E119 Type 2 diabetes mellitus without complications: Secondary | ICD-10-CM | POA: Diagnosis not present

## 2015-11-12 DIAGNOSIS — E7889 Other lipoprotein metabolism disorders: Secondary | ICD-10-CM | POA: Diagnosis not present

## 2015-11-12 DIAGNOSIS — I10 Essential (primary) hypertension: Secondary | ICD-10-CM | POA: Diagnosis not present

## 2015-11-19 DIAGNOSIS — E789 Disorder of lipoprotein metabolism, unspecified: Secondary | ICD-10-CM | POA: Diagnosis not present

## 2015-11-19 DIAGNOSIS — I10 Essential (primary) hypertension: Secondary | ICD-10-CM | POA: Diagnosis not present

## 2015-11-19 DIAGNOSIS — E118 Type 2 diabetes mellitus with unspecified complications: Secondary | ICD-10-CM | POA: Diagnosis not present

## 2015-11-20 DIAGNOSIS — H6120 Impacted cerumen, unspecified ear: Secondary | ICD-10-CM | POA: Diagnosis not present

## 2015-11-25 DIAGNOSIS — T8521XA Breakdown (mechanical) of intraocular lens, initial encounter: Secondary | ICD-10-CM | POA: Diagnosis not present

## 2015-11-25 DIAGNOSIS — H353232 Exudative age-related macular degeneration, bilateral, with inactive choroidal neovascularization: Secondary | ICD-10-CM | POA: Diagnosis not present

## 2015-11-25 DIAGNOSIS — Z961 Presence of intraocular lens: Secondary | ICD-10-CM | POA: Diagnosis not present

## 2015-11-25 DIAGNOSIS — H35033 Hypertensive retinopathy, bilateral: Secondary | ICD-10-CM | POA: Diagnosis not present

## 2016-01-21 ENCOUNTER — Ambulatory Visit: Payer: Medicare Other | Admitting: Podiatry

## 2016-01-22 ENCOUNTER — Ambulatory Visit: Payer: Medicare Other | Admitting: Podiatry

## 2016-01-28 ENCOUNTER — Ambulatory Visit (INDEPENDENT_AMBULATORY_CARE_PROVIDER_SITE_OTHER): Payer: Medicare Other | Admitting: Podiatry

## 2016-01-28 ENCOUNTER — Encounter: Payer: Self-pay | Admitting: Podiatry

## 2016-01-28 VITALS — BP 186/78 | HR 66

## 2016-01-28 DIAGNOSIS — B351 Tinea unguium: Secondary | ICD-10-CM

## 2016-01-28 DIAGNOSIS — M79606 Pain in leg, unspecified: Secondary | ICD-10-CM | POA: Diagnosis not present

## 2016-01-28 NOTE — Progress Notes (Signed)
Subjective:  82 y.o. year old male patient presents aided by a cane requesting toe nails trimmed. He was accompanied by his wife. Have poor eye sight.   Objective: Dermatologic: Thick dystrophic nails x 10.  Ingrown nails with thick dystrophic nails on both great toe.  Vascular: All pedal pulses are palpable.  No edema or erythema noted.  Orthopedic: Hallux valgus with bunion bilateral.  Neurologic: Decreased sensory perception bilateral.   Assessment:  Dystrophic mycotic nails x 10.  Ingrown nails debrided. Cleansed with Iodine and Amerigel ointment applied. TYpe II IDDM under control.  Poor vision. Painful feet.   Plan: All nails debrided. Return in 3 months

## 2016-01-28 NOTE — Patient Instructions (Signed)
Seen for hypertrophic nails. All nails debrided. Return in 3 months or as needed.  

## 2016-03-19 DIAGNOSIS — H2512 Age-related nuclear cataract, left eye: Secondary | ICD-10-CM | POA: Diagnosis not present

## 2016-03-19 DIAGNOSIS — E113592 Type 2 diabetes mellitus with proliferative diabetic retinopathy without macular edema, left eye: Secondary | ICD-10-CM | POA: Diagnosis not present

## 2016-03-19 DIAGNOSIS — Z9842 Cataract extraction status, left eye: Secondary | ICD-10-CM | POA: Diagnosis not present

## 2016-03-19 DIAGNOSIS — Z952 Presence of prosthetic heart valve: Secondary | ICD-10-CM | POA: Diagnosis not present

## 2016-03-19 DIAGNOSIS — Z961 Presence of intraocular lens: Secondary | ICD-10-CM | POA: Diagnosis not present

## 2016-03-19 DIAGNOSIS — H4302 Vitreous prolapse, left eye: Secondary | ICD-10-CM | POA: Diagnosis not present

## 2016-03-19 DIAGNOSIS — I2510 Atherosclerotic heart disease of native coronary artery without angina pectoris: Secondary | ICD-10-CM | POA: Diagnosis not present

## 2016-03-19 DIAGNOSIS — Z794 Long term (current) use of insulin: Secondary | ICD-10-CM | POA: Diagnosis not present

## 2016-03-19 DIAGNOSIS — T8529XA Other mechanical complication of intraocular lens, initial encounter: Secondary | ICD-10-CM | POA: Diagnosis not present

## 2016-03-19 DIAGNOSIS — Z9841 Cataract extraction status, right eye: Secondary | ICD-10-CM | POA: Diagnosis not present

## 2016-03-19 DIAGNOSIS — H40052 Ocular hypertension, left eye: Secondary | ICD-10-CM | POA: Diagnosis not present

## 2016-03-19 DIAGNOSIS — H1589 Other disorders of sclera: Secondary | ICD-10-CM | POA: Diagnosis not present

## 2016-03-19 DIAGNOSIS — N183 Chronic kidney disease, stage 3 (moderate): Secondary | ICD-10-CM | POA: Diagnosis not present

## 2016-03-19 DIAGNOSIS — T8521XA Breakdown (mechanical) of intraocular lens, initial encounter: Secondary | ICD-10-CM | POA: Diagnosis not present

## 2016-03-19 DIAGNOSIS — E1122 Type 2 diabetes mellitus with diabetic chronic kidney disease: Secondary | ICD-10-CM | POA: Diagnosis not present

## 2016-03-19 DIAGNOSIS — I129 Hypertensive chronic kidney disease with stage 1 through stage 4 chronic kidney disease, or unspecified chronic kidney disease: Secondary | ICD-10-CM | POA: Diagnosis not present

## 2016-03-19 DIAGNOSIS — H4052X0 Glaucoma secondary to other eye disorders, left eye, stage unspecified: Secondary | ICD-10-CM | POA: Diagnosis not present

## 2016-03-19 HISTORY — PX: EYE SURGERY: SHX253

## 2016-03-19 HISTORY — PX: PARS PLANA VITRECTOMY: SHX2166

## 2016-04-29 ENCOUNTER — Ambulatory Visit: Payer: Medicare Other | Admitting: Podiatry

## 2016-05-04 ENCOUNTER — Ambulatory Visit (INDEPENDENT_AMBULATORY_CARE_PROVIDER_SITE_OTHER): Payer: Medicare Other | Admitting: Podiatry

## 2016-05-04 ENCOUNTER — Encounter: Payer: Self-pay | Admitting: Podiatry

## 2016-05-04 DIAGNOSIS — M79672 Pain in left foot: Secondary | ICD-10-CM

## 2016-05-04 DIAGNOSIS — B351 Tinea unguium: Secondary | ICD-10-CM | POA: Diagnosis not present

## 2016-05-04 DIAGNOSIS — M79671 Pain in right foot: Secondary | ICD-10-CM

## 2016-05-04 DIAGNOSIS — L600 Ingrowing nail: Secondary | ICD-10-CM

## 2016-05-04 NOTE — Progress Notes (Signed)
Subjective:  82 y.o. year old male patient presents aided by a cane due to poor eye sight drequesting toe nails trimmed.   Objective: Dermatologic:  Ingrown nails with thick dystrophic nails on both great toe.  Vascular: All pedal pulses are palpable.  No edema or erythema noted.  Orthopedic: Hallux valgus with bunion bilateral.  Neurologic: Decreased sensory perception bilateral.   Assessment:  Dystrophic mycotic nails x 10.  Ingrown nails debrided. TYpe II IDDM under control.  Poor vision. Painful feet.   Plan: All nails debrided. Return in 3 months

## 2016-05-04 NOTE — Patient Instructions (Signed)
Seen for hypertrophic nails. All nails debrided. Return in 3 months or as needed.  

## 2016-05-13 DIAGNOSIS — Z79899 Other long term (current) drug therapy: Secondary | ICD-10-CM | POA: Diagnosis not present

## 2016-05-13 DIAGNOSIS — Z125 Encounter for screening for malignant neoplasm of prostate: Secondary | ICD-10-CM | POA: Diagnosis not present

## 2016-05-13 DIAGNOSIS — E7889 Other lipoprotein metabolism disorders: Secondary | ICD-10-CM | POA: Diagnosis not present

## 2016-05-13 DIAGNOSIS — E119 Type 2 diabetes mellitus without complications: Secondary | ICD-10-CM | POA: Diagnosis not present

## 2016-05-13 DIAGNOSIS — Z0000 Encounter for general adult medical examination without abnormal findings: Secondary | ICD-10-CM | POA: Diagnosis not present

## 2016-05-13 DIAGNOSIS — I10 Essential (primary) hypertension: Secondary | ICD-10-CM | POA: Diagnosis not present

## 2016-05-20 DIAGNOSIS — E789 Disorder of lipoprotein metabolism, unspecified: Secondary | ICD-10-CM | POA: Diagnosis not present

## 2016-05-20 DIAGNOSIS — I10 Essential (primary) hypertension: Secondary | ICD-10-CM | POA: Diagnosis not present

## 2016-05-20 DIAGNOSIS — E118 Type 2 diabetes mellitus with unspecified complications: Secondary | ICD-10-CM | POA: Diagnosis not present

## 2016-05-27 DIAGNOSIS — I350 Nonrheumatic aortic (valve) stenosis: Secondary | ICD-10-CM | POA: Diagnosis not present

## 2016-05-27 DIAGNOSIS — Z79899 Other long term (current) drug therapy: Secondary | ICD-10-CM | POA: Diagnosis not present

## 2016-05-27 DIAGNOSIS — Z952 Presence of prosthetic heart valve: Secondary | ICD-10-CM | POA: Diagnosis not present

## 2016-05-27 DIAGNOSIS — I2510 Atherosclerotic heart disease of native coronary artery without angina pectoris: Secondary | ICD-10-CM | POA: Diagnosis not present

## 2016-05-27 DIAGNOSIS — Z794 Long term (current) use of insulin: Secondary | ICD-10-CM | POA: Diagnosis not present

## 2016-05-27 DIAGNOSIS — Z7982 Long term (current) use of aspirin: Secondary | ICD-10-CM | POA: Diagnosis not present

## 2016-05-27 DIAGNOSIS — Z951 Presence of aortocoronary bypass graft: Secondary | ICD-10-CM | POA: Diagnosis not present

## 2016-06-15 DIAGNOSIS — E119 Type 2 diabetes mellitus without complications: Secondary | ICD-10-CM | POA: Diagnosis not present

## 2016-06-22 DIAGNOSIS — E119 Type 2 diabetes mellitus without complications: Secondary | ICD-10-CM | POA: Diagnosis not present

## 2016-06-22 DIAGNOSIS — H35033 Hypertensive retinopathy, bilateral: Secondary | ICD-10-CM | POA: Diagnosis not present

## 2016-08-04 ENCOUNTER — Ambulatory Visit: Payer: Medicare Other | Admitting: Podiatry

## 2016-09-09 DIAGNOSIS — E119 Type 2 diabetes mellitus without complications: Secondary | ICD-10-CM | POA: Diagnosis not present

## 2016-09-09 DIAGNOSIS — I10 Essential (primary) hypertension: Secondary | ICD-10-CM | POA: Diagnosis not present

## 2016-09-09 DIAGNOSIS — E789 Disorder of lipoprotein metabolism, unspecified: Secondary | ICD-10-CM | POA: Diagnosis not present

## 2016-09-14 DIAGNOSIS — H353232 Exudative age-related macular degeneration, bilateral, with inactive choroidal neovascularization: Secondary | ICD-10-CM | POA: Diagnosis not present

## 2016-09-14 DIAGNOSIS — H35033 Hypertensive retinopathy, bilateral: Secondary | ICD-10-CM | POA: Diagnosis not present

## 2016-09-14 DIAGNOSIS — Z961 Presence of intraocular lens: Secondary | ICD-10-CM | POA: Diagnosis not present

## 2016-09-14 DIAGNOSIS — H40052 Ocular hypertension, left eye: Secondary | ICD-10-CM | POA: Diagnosis not present

## 2016-09-14 DIAGNOSIS — T8521XD Breakdown (mechanical) of intraocular lens, subsequent encounter: Secondary | ICD-10-CM | POA: Diagnosis not present

## 2016-09-16 DIAGNOSIS — E790 Hyperuricemia without signs of inflammatory arthritis and tophaceous disease: Secondary | ICD-10-CM | POA: Diagnosis not present

## 2016-09-16 DIAGNOSIS — I10 Essential (primary) hypertension: Secondary | ICD-10-CM | POA: Diagnosis not present

## 2016-09-16 DIAGNOSIS — E118 Type 2 diabetes mellitus with unspecified complications: Secondary | ICD-10-CM | POA: Diagnosis not present

## 2016-09-30 ENCOUNTER — Encounter: Payer: Self-pay | Admitting: Podiatry

## 2016-09-30 ENCOUNTER — Ambulatory Visit (INDEPENDENT_AMBULATORY_CARE_PROVIDER_SITE_OTHER): Payer: Medicare Other | Admitting: Podiatry

## 2016-09-30 DIAGNOSIS — L600 Ingrowing nail: Secondary | ICD-10-CM | POA: Diagnosis not present

## 2016-09-30 DIAGNOSIS — M79671 Pain in right foot: Secondary | ICD-10-CM

## 2016-09-30 DIAGNOSIS — B351 Tinea unguium: Secondary | ICD-10-CM

## 2016-09-30 DIAGNOSIS — M79672 Pain in left foot: Secondary | ICD-10-CM | POA: Diagnosis not present

## 2016-09-30 NOTE — Patient Instructions (Signed)
Seen for hypertrophic nails. All nails debrided. Return in 3 months or as needed.  

## 2016-09-30 NOTE — Progress Notes (Signed)
Subjective:  83 y.o. year old male patient presents complaining of painful toes from ingrown nails. Patient is legally blinded and use cane and escort.   Objective: Dermatologic: Ingrown nails with thick dystrophic nails on both great toe.  Vascular:All pedal pulses are palpable.  No edema or erythema noted.  Orthopedic:Hallux valgus with bunion bilateral.  Neurologic:Decreased sensory perception bilateral.   Assessment: Dystrophic mycotic nails x 10.  Ingrown nails debrided. TYpe II IDDM under control.  Poor vision. Painful feet.   Plan: All nails debrided. Bleeding nails thermostasis done with Silver nitrate. Return in 3 months

## 2016-10-26 DIAGNOSIS — H353232 Exudative age-related macular degeneration, bilateral, with inactive choroidal neovascularization: Secondary | ICD-10-CM | POA: Diagnosis not present

## 2016-10-26 DIAGNOSIS — H40052 Ocular hypertension, left eye: Secondary | ICD-10-CM | POA: Diagnosis not present

## 2016-10-26 DIAGNOSIS — Z961 Presence of intraocular lens: Secondary | ICD-10-CM | POA: Diagnosis not present

## 2016-10-26 DIAGNOSIS — H35033 Hypertensive retinopathy, bilateral: Secondary | ICD-10-CM | POA: Diagnosis not present

## 2016-10-26 DIAGNOSIS — T8521XD Breakdown (mechanical) of intraocular lens, subsequent encounter: Secondary | ICD-10-CM | POA: Diagnosis not present

## 2016-12-09 DIAGNOSIS — E789 Disorder of lipoprotein metabolism, unspecified: Secondary | ICD-10-CM | POA: Diagnosis not present

## 2016-12-09 DIAGNOSIS — E118 Type 2 diabetes mellitus with unspecified complications: Secondary | ICD-10-CM | POA: Diagnosis not present

## 2016-12-16 DIAGNOSIS — R079 Chest pain, unspecified: Secondary | ICD-10-CM | POA: Diagnosis not present

## 2016-12-16 DIAGNOSIS — R0781 Pleurodynia: Secondary | ICD-10-CM | POA: Diagnosis not present

## 2016-12-16 DIAGNOSIS — E118 Type 2 diabetes mellitus with unspecified complications: Secondary | ICD-10-CM | POA: Diagnosis not present

## 2016-12-16 DIAGNOSIS — I10 Essential (primary) hypertension: Secondary | ICD-10-CM | POA: Diagnosis not present

## 2016-12-16 DIAGNOSIS — M109 Gout, unspecified: Secondary | ICD-10-CM | POA: Diagnosis not present

## 2016-12-30 ENCOUNTER — Ambulatory Visit: Payer: Medicare Other | Admitting: Podiatry

## 2017-01-05 ENCOUNTER — Encounter: Payer: Self-pay | Admitting: Podiatry

## 2017-01-05 ENCOUNTER — Ambulatory Visit (INDEPENDENT_AMBULATORY_CARE_PROVIDER_SITE_OTHER): Payer: Medicare Other | Admitting: Podiatry

## 2017-01-05 DIAGNOSIS — M79671 Pain in right foot: Secondary | ICD-10-CM | POA: Diagnosis not present

## 2017-01-05 DIAGNOSIS — M79672 Pain in left foot: Secondary | ICD-10-CM

## 2017-01-05 DIAGNOSIS — L600 Ingrowing nail: Secondary | ICD-10-CM | POA: Diagnosis not present

## 2017-01-05 DIAGNOSIS — B351 Tinea unguium: Secondary | ICD-10-CM | POA: Diagnosis not present

## 2017-01-05 NOTE — Progress Notes (Signed)
Subjective:  83 y.o. year old male patient presents complaining of painful toes from ingrown nails.  Feet are numb and hurts at times. Patient is legally blinded and use cane and escort.   Objective: Dermatologic: Ingrown nails with thick dystrophic nails on both great toe.  Vascular:All pedal pulses are palpable.  No edema or erythema noted.  Orthopedic:Hallux valgus with bunion bilateral.  Neurologic:Decreased sensory perception with subjective numbness bilateral.   Assessment: Dystrophic mycotic nails x 10.  Ingrown nails both great toes TYpe II IDDM under control.  Diabetic neuropathy. Poor vision. Painful feet.   Plan: All nails debrided. Bleeding nails thermostasis done with Silver nitrate. Return in 3 months

## 2017-01-05 NOTE — Patient Instructions (Signed)
Seen for hypertrophic nails. All nails debrided. Return in 3 months or as needed.  

## 2017-01-25 DIAGNOSIS — R600 Localized edema: Secondary | ICD-10-CM | POA: Diagnosis not present

## 2017-01-25 DIAGNOSIS — E1122 Type 2 diabetes mellitus with diabetic chronic kidney disease: Secondary | ICD-10-CM | POA: Diagnosis not present

## 2017-01-25 DIAGNOSIS — I129 Hypertensive chronic kidney disease with stage 1 through stage 4 chronic kidney disease, or unspecified chronic kidney disease: Secondary | ICD-10-CM | POA: Diagnosis not present

## 2017-01-25 DIAGNOSIS — E871 Hypo-osmolality and hyponatremia: Secondary | ICD-10-CM | POA: Diagnosis not present

## 2017-01-25 DIAGNOSIS — Z794 Long term (current) use of insulin: Secondary | ICD-10-CM | POA: Diagnosis not present

## 2017-01-25 DIAGNOSIS — N183 Chronic kidney disease, stage 3 (moderate): Secondary | ICD-10-CM | POA: Diagnosis not present

## 2017-01-25 DIAGNOSIS — R809 Proteinuria, unspecified: Secondary | ICD-10-CM | POA: Diagnosis not present

## 2017-01-25 DIAGNOSIS — N281 Cyst of kidney, acquired: Secondary | ICD-10-CM | POA: Diagnosis not present

## 2017-02-03 DIAGNOSIS — N183 Chronic kidney disease, stage 3 (moderate): Secondary | ICD-10-CM | POA: Diagnosis not present

## 2017-02-03 DIAGNOSIS — N281 Cyst of kidney, acquired: Secondary | ICD-10-CM | POA: Diagnosis not present

## 2017-03-01 DIAGNOSIS — H353232 Exudative age-related macular degeneration, bilateral, with inactive choroidal neovascularization: Secondary | ICD-10-CM | POA: Diagnosis not present

## 2017-03-01 DIAGNOSIS — H35033 Hypertensive retinopathy, bilateral: Secondary | ICD-10-CM | POA: Diagnosis not present

## 2017-03-01 DIAGNOSIS — H40052 Ocular hypertension, left eye: Secondary | ICD-10-CM | POA: Diagnosis not present

## 2017-03-01 DIAGNOSIS — Z961 Presence of intraocular lens: Secondary | ICD-10-CM | POA: Diagnosis not present

## 2017-03-01 DIAGNOSIS — T8521XD Breakdown (mechanical) of intraocular lens, subsequent encounter: Secondary | ICD-10-CM | POA: Diagnosis not present

## 2017-04-24 ENCOUNTER — Encounter (HOSPITAL_COMMUNITY): Payer: Self-pay | Admitting: Nurse Practitioner

## 2017-04-24 ENCOUNTER — Inpatient Hospital Stay (HOSPITAL_COMMUNITY)
Admission: EM | Admit: 2017-04-24 | Discharge: 2017-05-07 | DRG: 065 | Disposition: A | Discharge status: Skilled Nursing Facility | Payer: Medicare Other | Attending: Internal Medicine | Admitting: Internal Medicine

## 2017-04-24 DIAGNOSIS — Z9841 Cataract extraction status, right eye: Secondary | ICD-10-CM

## 2017-04-24 DIAGNOSIS — R7881 Bacteremia: Secondary | ICD-10-CM | POA: Diagnosis not present

## 2017-04-24 DIAGNOSIS — N183 Chronic kidney disease, stage 3 unspecified: Secondary | ICD-10-CM

## 2017-04-24 DIAGNOSIS — K219 Gastro-esophageal reflux disease without esophagitis: Secondary | ICD-10-CM | POA: Diagnosis present

## 2017-04-24 DIAGNOSIS — B961 Klebsiella pneumoniae [K. pneumoniae] as the cause of diseases classified elsewhere: Secondary | ICD-10-CM | POA: Diagnosis not present

## 2017-04-24 DIAGNOSIS — E559 Vitamin D deficiency, unspecified: Secondary | ICD-10-CM | POA: Diagnosis present

## 2017-04-24 DIAGNOSIS — I2510 Atherosclerotic heart disease of native coronary artery without angina pectoris: Secondary | ICD-10-CM | POA: Diagnosis present

## 2017-04-24 DIAGNOSIS — Z23 Encounter for immunization: Secondary | ICD-10-CM

## 2017-04-24 DIAGNOSIS — M25462 Effusion, left knee: Secondary | ICD-10-CM | POA: Diagnosis present

## 2017-04-24 DIAGNOSIS — R52 Pain, unspecified: Secondary | ICD-10-CM

## 2017-04-24 DIAGNOSIS — Z87891 Personal history of nicotine dependence: Secondary | ICD-10-CM

## 2017-04-24 DIAGNOSIS — R42 Dizziness and giddiness: Secondary | ICD-10-CM

## 2017-04-24 DIAGNOSIS — H409 Unspecified glaucoma: Secondary | ICD-10-CM | POA: Diagnosis present

## 2017-04-24 DIAGNOSIS — E11311 Type 2 diabetes mellitus with unspecified diabetic retinopathy with macular edema: Secondary | ICD-10-CM | POA: Diagnosis present

## 2017-04-24 DIAGNOSIS — D72829 Elevated white blood cell count, unspecified: Secondary | ICD-10-CM

## 2017-04-24 DIAGNOSIS — H548 Legal blindness, as defined in USA: Secondary | ICD-10-CM | POA: Diagnosis present

## 2017-04-24 DIAGNOSIS — Z888 Allergy status to other drugs, medicaments and biological substances status: Secondary | ICD-10-CM

## 2017-04-24 DIAGNOSIS — E1122 Type 2 diabetes mellitus with diabetic chronic kidney disease: Secondary | ICD-10-CM | POA: Diagnosis present

## 2017-04-24 DIAGNOSIS — H3530 Unspecified macular degeneration: Secondary | ICD-10-CM | POA: Diagnosis present

## 2017-04-24 DIAGNOSIS — Z7982 Long term (current) use of aspirin: Secondary | ICD-10-CM

## 2017-04-24 DIAGNOSIS — B965 Pseudomonas (aeruginosa) (mallei) (pseudomallei) as the cause of diseases classified elsewhere: Secondary | ICD-10-CM | POA: Diagnosis not present

## 2017-04-24 DIAGNOSIS — E119 Type 2 diabetes mellitus without complications: Secondary | ICD-10-CM

## 2017-04-24 DIAGNOSIS — R011 Cardiac murmur, unspecified: Secondary | ICD-10-CM | POA: Diagnosis present

## 2017-04-24 DIAGNOSIS — Z952 Presence of prosthetic heart valve: Secondary | ICD-10-CM

## 2017-04-24 DIAGNOSIS — Z951 Presence of aortocoronary bypass graft: Secondary | ICD-10-CM

## 2017-04-24 DIAGNOSIS — Z8673 Personal history of transient ischemic attack (TIA), and cerebral infarction without residual deficits: Secondary | ICD-10-CM

## 2017-04-24 DIAGNOSIS — E785 Hyperlipidemia, unspecified: Secondary | ICD-10-CM | POA: Diagnosis present

## 2017-04-24 DIAGNOSIS — Z9842 Cataract extraction status, left eye: Secondary | ICD-10-CM

## 2017-04-24 DIAGNOSIS — I639 Cerebral infarction, unspecified: Secondary | ICD-10-CM | POA: Diagnosis present

## 2017-04-24 DIAGNOSIS — I10 Essential (primary) hypertension: Secondary | ICD-10-CM | POA: Diagnosis present

## 2017-04-24 DIAGNOSIS — Z961 Presence of intraocular lens: Secondary | ICD-10-CM | POA: Diagnosis present

## 2017-04-24 DIAGNOSIS — I129 Hypertensive chronic kidney disease with stage 1 through stage 4 chronic kidney disease, or unspecified chronic kidney disease: Secondary | ICD-10-CM | POA: Diagnosis present

## 2017-04-24 DIAGNOSIS — I6340 Cerebral infarction due to embolism of unspecified cerebral artery: Secondary | ICD-10-CM | POA: Diagnosis not present

## 2017-04-24 DIAGNOSIS — Z803 Family history of malignant neoplasm of breast: Secondary | ICD-10-CM

## 2017-04-24 DIAGNOSIS — Z801 Family history of malignant neoplasm of trachea, bronchus and lung: Secondary | ICD-10-CM

## 2017-04-24 DIAGNOSIS — N390 Urinary tract infection, site not specified: Secondary | ICD-10-CM | POA: Diagnosis not present

## 2017-04-24 DIAGNOSIS — R509 Fever, unspecified: Secondary | ICD-10-CM

## 2017-04-24 DIAGNOSIS — M109 Gout, unspecified: Secondary | ICD-10-CM | POA: Diagnosis present

## 2017-04-24 DIAGNOSIS — N400 Enlarged prostate without lower urinary tract symptoms: Secondary | ICD-10-CM | POA: Diagnosis present

## 2017-04-24 DIAGNOSIS — Z794 Long term (current) use of insulin: Secondary | ICD-10-CM

## 2017-04-24 HISTORY — DX: Tributary (branch) retinal vein occlusion, unspecified eye, stable: H34.8392

## 2017-04-24 HISTORY — DX: Gastro-esophageal reflux disease without esophagitis: K21.9

## 2017-04-24 HISTORY — DX: Gout, unspecified: M10.9

## 2017-04-24 HISTORY — DX: Type 2 diabetes mellitus with unspecified diabetic retinopathy without macular edema: E11.319

## 2017-04-24 HISTORY — DX: Retinal edema: H35.81

## 2017-04-24 HISTORY — DX: Presence of intraocular lens: Z96.1

## 2017-04-24 HISTORY — DX: Nonrheumatic aortic (valve) stenosis: I35.0

## 2017-04-24 HISTORY — DX: Atherosclerotic heart disease of native coronary artery without angina pectoris: I25.10

## 2017-04-24 NOTE — ED Triage Notes (Signed)
Pt is presented from for evaluation for N/V and dizziness onset an hour ago.

## 2017-04-24 NOTE — ED Notes (Signed)
Bed: WA05 Expected date:  Expected time:  Means of arrival:  Comments: EMS 

## 2017-04-25 ENCOUNTER — Emergency Department (HOSPITAL_COMMUNITY): Payer: Medicare Other

## 2017-04-25 ENCOUNTER — Encounter (HOSPITAL_COMMUNITY): Payer: Self-pay | Admitting: Internal Medicine

## 2017-04-25 DIAGNOSIS — R42 Dizziness and giddiness: Secondary | ICD-10-CM

## 2017-04-25 DIAGNOSIS — I10 Essential (primary) hypertension: Secondary | ICD-10-CM | POA: Diagnosis not present

## 2017-04-25 DIAGNOSIS — E118 Type 2 diabetes mellitus with unspecified complications: Secondary | ICD-10-CM

## 2017-04-25 DIAGNOSIS — Z794 Long term (current) use of insulin: Secondary | ICD-10-CM | POA: Diagnosis not present

## 2017-04-25 DIAGNOSIS — N183 Chronic kidney disease, stage 3 unspecified: Secondary | ICD-10-CM

## 2017-04-25 LAB — CBC WITH DIFFERENTIAL/PLATELET
Basophils Absolute: 0.1 10*3/uL (ref 0.0–0.1)
Basophils Relative: 0 %
EOS ABS: 0.2 10*3/uL (ref 0.0–0.7)
Eosinophils Relative: 1 %
HEMATOCRIT: 50.1 % (ref 39.0–52.0)
HEMOGLOBIN: 18.1 g/dL — AB (ref 13.0–17.0)
LYMPHS ABS: 1.8 10*3/uL (ref 0.7–4.0)
Lymphocytes Relative: 12 %
MCH: 30.3 pg (ref 26.0–34.0)
MCHC: 36.1 g/dL — AB (ref 30.0–36.0)
MCV: 83.9 fL (ref 78.0–100.0)
MONOS PCT: 7 %
Monocytes Absolute: 1.1 10*3/uL — ABNORMAL HIGH (ref 0.1–1.0)
NEUTROS ABS: 12.7 10*3/uL — AB (ref 1.7–7.7)
NEUTROS PCT: 80 %
Platelets: 200 10*3/uL (ref 150–400)
RBC: 5.97 MIL/uL — ABNORMAL HIGH (ref 4.22–5.81)
RDW: 14.3 % (ref 11.5–15.5)
WBC: 15.9 10*3/uL — ABNORMAL HIGH (ref 4.0–10.5)

## 2017-04-25 LAB — COMPREHENSIVE METABOLIC PANEL
ALBUMIN: 4.5 g/dL (ref 3.5–5.0)
ALT: 31 U/L (ref 17–63)
AST: 34 U/L (ref 15–41)
Alkaline Phosphatase: 89 U/L (ref 38–126)
Anion gap: 14 (ref 5–15)
BUN: 25 mg/dL — ABNORMAL HIGH (ref 6–20)
CHLORIDE: 101 mmol/L (ref 101–111)
CO2: 21 mmol/L — AB (ref 22–32)
CREATININE: 2 mg/dL — AB (ref 0.61–1.24)
Calcium: 9.9 mg/dL (ref 8.9–10.3)
GFR calc non Af Amer: 29 mL/min — ABNORMAL LOW (ref >60)
GFR, EST AFRICAN AMERICAN: 34 mL/min — AB (ref >60)
GLUCOSE: 254 mg/dL — AB (ref 65–99)
Potassium: 4.1 mmol/L (ref 3.5–5.1)
SODIUM: 136 mmol/L (ref 135–145)
Total Bilirubin: 0.5 mg/dL (ref 0.3–1.2)
Total Protein: 8.1 g/dL (ref 6.5–8.1)

## 2017-04-25 LAB — HEMOGLOBIN A1C
HEMOGLOBIN A1C: 7 % — AB (ref 4.8–5.6)
Mean Plasma Glucose: 154.2 mg/dL

## 2017-04-25 LAB — GLUCOSE, CAPILLARY
GLUCOSE-CAPILLARY: 272 mg/dL — AB (ref 65–99)
Glucose-Capillary: 257 mg/dL — ABNORMAL HIGH (ref 65–99)
Glucose-Capillary: 236 mg/dL — ABNORMAL HIGH (ref 65–99)
Glucose-Capillary: 247 mg/dL — ABNORMAL HIGH (ref 65–99)

## 2017-04-25 MED ORDER — SENNA 8.6 MG PO TABS
1.0000 | ORAL_TABLET | Freq: Every day | ORAL | Status: DC
  Administered 2017-04-25 – 2017-05-06 (×10): 8.6 mg via ORAL
  Filled 2017-04-25 (×10): qty 1

## 2017-04-25 MED ORDER — LATANOPROST 0.005 % OP SOLN
1.0000 [drp] | Freq: Every day | OPHTHALMIC | Status: DC
  Administered 2017-04-25 – 2017-05-04 (×6): 1 [drp] via OPHTHALMIC
  Filled 2017-04-25: qty 2.5

## 2017-04-25 MED ORDER — PANTOPRAZOLE SODIUM 40 MG PO TBEC
40.0000 mg | DELAYED_RELEASE_TABLET | Freq: Every day | ORAL | Status: DC
  Administered 2017-04-25 – 2017-05-07 (×12): 40 mg via ORAL
  Filled 2017-04-25 (×12): qty 1

## 2017-04-25 MED ORDER — INFLUENZA VAC SPLIT HIGH-DOSE 0.5 ML IM SUSY
0.5000 mL | PREFILLED_SYRINGE | INTRAMUSCULAR | Status: AC
  Administered 2017-04-26: 0.5 mL via INTRAMUSCULAR
  Filled 2017-04-25: qty 0.5

## 2017-04-25 MED ORDER — EZETIMIBE 10 MG PO TABS: 10.0000 mg | ORAL_TABLET | Freq: Every day | ORAL | Status: DC

## 2017-04-25 MED ORDER — FUROSEMIDE 40 MG PO TABS
40.0000 mg | ORAL_TABLET | Freq: Two times a day (BID) | ORAL | Status: DC
  Administered 2017-04-25: 40 mg via ORAL
  Filled 2017-04-25: qty 1

## 2017-04-25 MED ORDER — METOPROLOL TARTRATE 50 MG PO TABS
50.0000 mg | ORAL_TABLET | Freq: Two times a day (BID) | ORAL | Status: DC
  Administered 2017-04-25 – 2017-05-07 (×24): 50 mg via ORAL
  Filled 2017-04-25 (×12): qty 1
  Filled 2017-04-25: qty 2
  Filled 2017-04-25 (×11): qty 1

## 2017-04-25 MED ORDER — DOCUSATE SODIUM 100 MG PO CAPS
100.0000 mg | ORAL_CAPSULE | Freq: Two times a day (BID) | ORAL | Status: DC
  Administered 2017-04-25 – 2017-05-07 (×21): 100 mg via ORAL
  Filled 2017-04-25 (×22): qty 1

## 2017-04-25 MED ORDER — SODIUM CHLORIDE 0.9% FLUSH
3.0000 mL | INTRAVENOUS | Status: DC | PRN
  Administered 2017-04-30: 3 mL via INTRAVENOUS
  Filled 2017-04-25: qty 3

## 2017-04-25 MED ORDER — SODIUM CHLORIDE 0.9 % IV SOLN: 250.0000 mL | INTRAVENOUS | Status: DC | PRN

## 2017-04-25 MED ORDER — INSULIN ASPART 100 UNIT/ML ~~LOC~~ SOLN
0.0000 [IU] | Freq: Three times a day (TID) | SUBCUTANEOUS | Status: DC
  Administered 2017-04-25 (×3): 5 [IU] via SUBCUTANEOUS
  Administered 2017-04-26 (×2): 3 [IU] via SUBCUTANEOUS
  Administered 2017-04-27 – 2017-04-28 (×3): 2 [IU] via SUBCUTANEOUS
  Administered 2017-04-28: 1 [IU] via SUBCUTANEOUS
  Administered 2017-04-29: 2 [IU] via SUBCUTANEOUS
  Administered 2017-04-29: 1 [IU] via SUBCUTANEOUS
  Administered 2017-04-29: 2 [IU] via SUBCUTANEOUS
  Administered 2017-04-30: 3 [IU] via SUBCUTANEOUS
  Administered 2017-04-30: 5 [IU] via SUBCUTANEOUS
  Administered 2017-04-30: 2 [IU] via SUBCUTANEOUS
  Administered 2017-05-01: 5 [IU] via SUBCUTANEOUS
  Administered 2017-05-01: 7 [IU] via SUBCUTANEOUS
  Administered 2017-05-01: 5 [IU] via SUBCUTANEOUS
  Administered 2017-05-02 – 2017-05-03 (×4): 3 [IU] via SUBCUTANEOUS
  Administered 2017-05-03: 1 [IU] via SUBCUTANEOUS
  Administered 2017-05-03: 2 [IU] via SUBCUTANEOUS
  Administered 2017-05-04 – 2017-05-05 (×4): 1 [IU] via SUBCUTANEOUS
  Administered 2017-05-05: 2 [IU] via SUBCUTANEOUS
  Administered 2017-05-06: 1 [IU] via SUBCUTANEOUS
  Administered 2017-05-07 (×2): 3 [IU] via SUBCUTANEOUS

## 2017-04-25 MED ORDER — ACETAMINOPHEN 650 MG RE SUPP: 650.0000 mg | Freq: Four times a day (QID) | RECTAL | Status: DC | PRN

## 2017-04-25 MED ORDER — INSULIN ASPART 100 UNIT/ML ~~LOC~~ SOLN
0.0000 [IU] | Freq: Every day | SUBCUTANEOUS | Status: DC
  Administered 2017-04-25 – 2017-04-29 (×5): 2 [IU] via SUBCUTANEOUS
  Administered 2017-05-01: 3 [IU] via SUBCUTANEOUS
  Administered 2017-05-01: 5 [IU] via SUBCUTANEOUS
  Administered 2017-05-03: 2 [IU] via SUBCUTANEOUS

## 2017-04-25 MED ORDER — MECLIZINE HCL 25 MG PO TABS
25.0000 mg | ORAL_TABLET | Freq: Once | ORAL | Status: AC
  Administered 2017-04-25: 25 mg via ORAL
  Filled 2017-04-25: qty 1

## 2017-04-25 MED ORDER — FUROSEMIDE 40 MG PO TABS
40.0000 mg | ORAL_TABLET | Freq: Every day | ORAL | Status: DC
  Administered 2017-04-26 – 2017-05-07 (×11): 40 mg via ORAL
  Filled 2017-04-25 (×11): qty 1

## 2017-04-25 MED ORDER — HYDRALAZINE HCL 50 MG PO TABS
50.0000 mg | ORAL_TABLET | Freq: Three times a day (TID) | ORAL | Status: DC
  Administered 2017-04-25 – 2017-05-07 (×36): 50 mg via ORAL
  Filled 2017-04-25 (×37): qty 1

## 2017-04-25 MED ORDER — INSULIN GLARGINE 100 UNIT/ML ~~LOC~~ SOLN
30.0000 [IU] | Freq: Two times a day (BID) | SUBCUTANEOUS | Status: DC
  Administered 2017-04-25 – 2017-05-02 (×14): 30 [IU] via SUBCUTANEOUS
  Filled 2017-04-25 (×16): qty 0.3

## 2017-04-25 MED ORDER — VITAMIN D (ERGOCALCIFEROL) 1.25 MG (50000 UNIT) PO CAPS
50000.0000 [IU] | ORAL_CAPSULE | ORAL | Status: DC
  Administered 2017-04-25: 50000 [IU] via ORAL
  Filled 2017-04-25: qty 1

## 2017-04-25 MED ORDER — EMPAGLIFLOZIN-LINAGLIPTIN 25-5 MG PO TABS: 1.0000 | ORAL_TABLET | Freq: Every day | ORAL | Status: DC

## 2017-04-25 MED ORDER — PREDNISOLONE ACETATE 1 % OP SUSP
1.0000 [drp] | Freq: Two times a day (BID) | OPHTHALMIC | Status: DC
  Filled 2017-04-25: qty 5

## 2017-04-25 MED ORDER — ONDANSETRON HCL 4 MG/2ML IJ SOLN
4.0000 mg | Freq: Once | INTRAMUSCULAR | Status: AC
  Administered 2017-04-25: 4 mg via INTRAVENOUS
  Filled 2017-04-25: qty 2

## 2017-04-25 MED ORDER — AMLODIPINE BESYLATE 5 MG PO TABS
10.0000 mg | ORAL_TABLET | Freq: Every day | ORAL | Status: DC
  Administered 2017-04-25 – 2017-04-29 (×5): 10 mg via ORAL
  Filled 2017-04-25: qty 1
  Filled 2017-04-25: qty 2
  Filled 2017-04-25: qty 1
  Filled 2017-04-25: qty 2
  Filled 2017-04-25: qty 1

## 2017-04-25 MED ORDER — SODIUM CHLORIDE 0.9 % IV BOLUS (SEPSIS)
500.0000 mL | Freq: Once | INTRAVENOUS | Status: AC
  Administered 2017-04-25: 500 mL via INTRAVENOUS

## 2017-04-25 MED ORDER — ALLOPURINOL 100 MG PO TABS
200.0000 mg | ORAL_TABLET | Freq: Every day | ORAL | Status: DC
  Administered 2017-04-25 – 2017-05-06 (×12): 200 mg via ORAL
  Filled 2017-04-25 (×12): qty 2

## 2017-04-25 MED ORDER — INSULIN GLARGINE 100 UNIT/ML ~~LOC~~ SOLN
25.0000 [IU] | Freq: Two times a day (BID) | SUBCUTANEOUS | Status: DC
  Administered 2017-04-25: 25 [IU] via SUBCUTANEOUS
  Filled 2017-04-25: qty 0.25

## 2017-04-25 MED ORDER — SODIUM CHLORIDE 0.9% FLUSH
3.0000 mL | Freq: Two times a day (BID) | INTRAVENOUS | Status: DC
  Administered 2017-04-25 – 2017-05-07 (×17): 3 mL via INTRAVENOUS

## 2017-04-25 MED ORDER — FERROUS SULFATE 325 (65 FE) MG PO TABS
325.0000 mg | ORAL_TABLET | Freq: Two times a day (BID) | ORAL | Status: DC
  Administered 2017-04-25 – 2017-05-07 (×23): 325 mg via ORAL
  Filled 2017-04-25 (×23): qty 1

## 2017-04-25 MED ORDER — ACETAMINOPHEN 325 MG PO TABS
650.0000 mg | ORAL_TABLET | Freq: Four times a day (QID) | ORAL | Status: DC | PRN
  Administered 2017-04-29 – 2017-05-02 (×2): 650 mg via ORAL
  Filled 2017-04-25 (×2): qty 2

## 2017-04-25 MED ORDER — HYDRALAZINE HCL 20 MG/ML IJ SOLN
10.0000 mg | Freq: Four times a day (QID) | INTRAMUSCULAR | Status: DC | PRN
  Administered 2017-04-27: 10 mg via INTRAVENOUS
  Filled 2017-04-25: qty 1

## 2017-04-25 MED ORDER — FOLIC ACID 1 MG PO TABS
1.0000 mg | ORAL_TABLET | Freq: Every day | ORAL | Status: DC
  Administered 2017-04-25 – 2017-05-07 (×12): 1 mg via ORAL
  Filled 2017-04-25 (×12): qty 1

## 2017-04-25 MED ORDER — DIAZEPAM 5 MG/ML IJ SOLN
2.5000 mg | Freq: Once | INTRAMUSCULAR | Status: AC
  Administered 2017-04-25: 2.5 mg via INTRAVENOUS
  Filled 2017-04-25: qty 2

## 2017-04-25 MED ORDER — FERROUS FUMARATE 325 (106 FE) MG PO TABS
1.0000 | ORAL_TABLET | Freq: Two times a day (BID) | ORAL | Status: DC
  Administered 2017-04-25: 106 mg via ORAL
  Filled 2017-04-25: qty 1

## 2017-04-25 NOTE — H&P (Signed)
TRH H&P   Patient Demographics:    Johnny [name], is a 83 y.o. male  MRN: [id number]   DOB - [birth year]  Admit Date - 04/24/2017  Outpatient Primary MD for the patient is Darci Needle, MD  Referring MD/NP/PA: Geoffery Lyons  Outpatient Specialists:  Charlotte Sanes (nephrologist) Gae Bon (opthamology) Newt Lukes (cardiology)  Patient coming from: home  Chief Complaint  Patient presents with  . N/V      HPI:    Johnny [name]  is a 83 y.o. male, w hypertension, hyperlipidemia, Dm2,  CVA, macular degeneration, glaucoma apparently c/o vertigo ,  Denies hearing loss or tinnitus.  No family history.  No recent illness.    In ED.  Wbc 15.9, Hgb 18.1, Plt 200 Bun 25, Creatinine 2.00  CT brain negative.    Pt will be admitted for new onset vertigo, n/v.      Review of systems:    In addition to the HPI above,  No Fever-chills, No Headache, No changes with Vision or hearing, No problems swallowing food or Liquids, No Chest pain, Cough or Shortness of Breath, No Abdominal pain, No Nausea or Vommitting, Bowel movements are regular, No Blood in stool or Urine, No dysuria, No new skin rashes or bruises, No new joints pains-aches,  No new weakness, tingling, numbness in any extremity, No recent weight gain or loss, No polyuria, polydypsia or polyphagia, No significant Mental Stressors.  A full 10 point Review of Systems was done, except as stated above, all other Review of Systems were negative.   With Past History of the following :    Past Medical History:  Diagnosis Date  . Aortic stenosis   . BPH (benign prostatic hypertrophy)   . Branch retinal vein occlusion 08/01/2013  . CAD (coronary artery disease)   . CVA (cerebral infarction)   . Diabetes mellitus (HCC)   . Diabetic retinopathy (HCC)   . Gait disturbance   . GERD (gastroesophageal  reflux disease)   . Gout   . Heart murmur   . HTN (hypertension)   . Macular degeneration   . Macular degeneration   . Macular edema   . Paresthesia   . Pseudophakia of right eye   . Ptosis       Past Surgical History:  Procedure Laterality Date  . AORTIC VALVE REPLACEMENT  03/26/2013  . CATARACT EXTRACTION W/ INTRAOCULAR LENS IMPLANT  11/01/2013  . CATARACT EXTRACTION W/ INTRAOCULAR LENS IMPLANT Right 12/18/2009  . COLONOSCOPY W/ POLYPECTOMY    . CORONARY ARTERY BYPASS GRAFT  03/26/2013  . EYE SURGERY  03/19/2016   Ocular tube shunt insertion  . PARS PLANA VITRECTOMY  03/19/2016  . PARS PLANA VITRECTOMY  11/08/2013  . PARS PLANA VITRECTOMY  08/02/2012  . PROSTATE SURGERY        Social History:     Social History  Substance Use Topics  .  Smoking status: Former Smoker    Packs/day: 1.00    Years: 40.00    Types: Cigarettes  . Smokeless tobacco: Never Used  . Alcohol use No     Lives - at home  Mobility - walks by self. Typically, unable to walk currently due to vertigo.   Family History :     Family History  Problem Relation Age of Onset  . Breast cancer Sister   . Lung cancer Brother       Home Medications:   Prior to Admission medications   Medication Sig Start Date End Date Taking? Authorizing Provider  allopurinol (ZYLOPRIM) 100 MG tablet Take 200 mg by mouth at bedtime.  11/07/11   [provider]  amLODipine (NORVASC) 10 MG tablet Take 10 mg by mouth daily.    [provider]  aspirin EC 81 MG tablet Take 81 mg by mouth daily.    [provider]  docusate sodium (COLACE) 100 MG capsule Take 100 mg by mouth 2 (two) times daily.    [provider]  Empagliflozin-Linagliptin (GLYXAMBI) 25-5 MG TABS Take 1 tablet by mouth daily.    [provider]  ezetimibe (ZETIA) 10 MG tablet Take 10 mg by mouth at bedtime.    [provider]  ferrous fumarate (HEMOCYTE - 106 MG FE) 325 (106 FE) MG TABS tablet  Take 1 tablet by mouth 2 (two) times daily.    [provider]  folic acid (FOLVITE) 1 MG tablet Take 1 mg by mouth daily.     [provider]  furosemide (LASIX) 40 MG tablet Take 40 mg by mouth 2 (two) times daily.    [provider]  hydrALAZINE (APRESOLINE) 50 MG tablet Take 50 mg by mouth 3 (three) times daily.  12/26/11   [provider]  insulin glargine (LANTUS) 100 UNIT/ML injection Inject 0.13-0.25 mLs (13-25 Units total) into the skin 2 (two) times daily. 13 units in the morning and 25 units at bedtime. 01/05/14   Regalado, Belkys A, MD  metoprolol (LOPRESSOR) 50 MG tablet Take 50 mg by mouth 2 (two) times daily.    [provider]  omeprazole-sodium bicarbonate (ZEGERID) 40-1100 MG per capsule Take 1 capsule by mouth daily before breakfast.    [provider]  prednisoLONE acetate (PRED FORTE) 1 % ophthalmic suspension Place 1 drop into the left eye 2 (two) times daily.    [provider]  senna (SENOKOT) 8.6 MG TABS tablet Take 1 tablet by mouth at bedtime.    [provider]  Vitamin D, Ergocalciferol, (DRISDOL) 50000 UNITS CAPS capsule Take 50,000 Units by mouth every 7 (seven) days. Once a week for 4 weeks then once per month for 6 months    [provider]     Allergies:     Allergies  Allergen Reactions  . Heparin     Other reaction(s): Heparin-Induced Thrombocytopenia     Physical Exam:   Vitals  Blood pressure (!) 176/92, pulse 70, temperature 97.7 F (36.5 C), temperature source Oral, resp. rate 15, SpO2 95 %.   1. General  lying in bed in NAD,    2. Normal affect and insight, Not Suicidal or Homicidal, Awake Alert, Oriented X 3.  3. No F.N deficits, ALL C.Nerves Intact, Strength 5/5 all 4 extremities, Sensation intact all 4 extremities, Plantars down going.  4. Ears and Eyes appear Normal, Conjunctivae clear, PERRLA. Moist Oral Mucosa.  5. Supple Neck, No JVD, No cervical  lymphadenopathy appriciated, No Carotid Bruits.  6. Symmetrical Chest wall movement, Good air movement bilaterally, CTAB.  7. RRR, No Gallops, Rubs or Murmurs, No Parasternal Heave.  8. Positive Bowel Sounds, Abdomen Soft, No tenderness, No organomegaly appriciated,No rebound -guarding or rigidity.  9.  No Cyanosis, Normal Skin Turgor, No Skin Rash or Bruise.  10. Good muscle tone,  joints appear normal , no effusions, Normal ROM.  11. No Palpable Lymph Nodes in Neck or Axillae     Data Review:    CBC  Recent Labs Lab 04/25/17 0050  WBC 15.9*  HGB 18.1*  HCT 50.1  PLT 200  MCV 83.9  MCH 30.3  MCHC 36.1*  RDW 14.3  LYMPHSABS 1.8  MONOABS 1.1*  EOSABS 0.2  BASOSABS 0.1   ------------------------------------------------------------------------------------------------------------------  Chemistries   Recent Labs Lab 04/25/17 0050  NA 136  K 4.1  CL 101  CO2 21*  GLUCOSE 254*  BUN 25*  CREATININE 2.00*  CALCIUM 9.9  AST 34  ALT 31  ALKPHOS 89  BILITOT 0.5   ------------------------------------------------------------------------------------------------------------------ CrCl cannot be calculated (Unknown ideal weight.). ------------------------------------------------------------------------------------------------------------------ No results for input(s): TSH, T4TOTAL, T3FREE, THYROIDAB in the last 72 hours.  Invalid input(s): FREET3  Coagulation profile No results for input(s): INR, PROTIME in the last 168 hours. ------------------------------------------------------------------------------------------------------------------- No results for input(s): DDIMER in the last 72 hours. -------------------------------------------------------------------------------------------------------------------  Cardiac Enzymes No results for input(s): CKMB, TROPONINI, MYOGLOBIN in the last 168 hours.  Invalid input(s):  CK ------------------------------------------------------------------------------------------------------------------ No results found for: BNP   ---------------------------------------------------------------------------------------------------------------  Urinalysis    Component Value Date/Time   COLORURINE YELLOW 01/02/2014 2236   APPEARANCEUR CLEAR 01/02/2014 2236   LABSPEC 1.017 01/02/2014 2236   PHURINE 5.0 01/02/2014 2236   GLUCOSEU >1000 (A) 01/02/2014 2236   HGBUR NEGATIVE 01/02/2014 2236   BILIRUBINUR NEGATIVE 01/02/2014 2236   KETONESUR NEGATIVE 01/02/2014 2236   PROTEINUR 100 (A) 01/02/2014 2236   UROBILINOGEN 0.2 01/02/2014 2236   NITRITE NEGATIVE 01/02/2014 2236   LEUKOCYTESUR NEGATIVE 01/02/2014 2236    ----------------------------------------------------------------------------------------------------------------   Imaging Results:    Ct Head Wo Contrast  Result Date: 04/25/2017 CLINICAL DATA:  83 y/o  M; ataxia, stroke suspected. EXAM: CT HEAD WITHOUT CONTRAST TECHNIQUE: Contiguous axial images were obtained from the base of the skull through the vertex without intravenous contrast. COMPARISON:  01/02/2014 CT head FINDINGS: Brain: No evidence of acute infarction, hemorrhage, hydrocephalus, extra-axial collection or mass lesion/mass effect. Small chronic infarcts are present in right posterior caudate body, right cerebellar hemisphere, and right putamen. Stable moderate brain parenchymal volume loss and mild chronic microvascular ischemic changes. Vascular: Calcific atherosclerosis of carotid siphons. No hyperdense vessel identified. Skull: Normal. Negative for fracture or focal lesion. Sinuses/Orbits: No acute finding. Other: Bilateral intra-ocular lens replacement. IMPRESSION: 1. No acute intracranial abnormality identified. 2. Stable moderate brain parenchymal volume loss and mild chronic microvascular ischemic changes of the brain. 3. Interval small chronic  infarction within the right superior cerebellar hemisphere. Electronically Signed   By: Mitzi Hansen M.D.   On: 04/25/2017 01:10    Assessment & Plan:    Principal Problem:   Vertigo Active Problems:   HTN (hypertension)   Diabetes mellitus (HCC)   CKD (chronic kidney disease) stage 3, GFR 30-59 ml/min (HCC)    Vertigo MRI brain r/o CVA Meclizine  po q6h prn   Hypertension Hydralazine  iv q6h prn  Cont amlodipine Cont metoropolol  Hyperlipidemia Continue zetia  Gout Cont allopurinol  Dm2 fsbs ac and qhs, ISS Cont  lantus Cont glyxambi  Vitamin D def Cont vitamin D  DVT Prophylaxis Lovenox - SCDs   AM Labs Ordered, also please review Full Orders  Family Communication: Admission, patients condition and plan of care including tests being ordered have been discussed with the patient  who indicate understanding and agree with the plan and Code Status.  Code Status FULL CODE  Likely DC to  home  Condition GUARDED  Consults called: none  Admission status: inpatient  Time spent in minutes : 45   Pearson Grippe M.D on 04/25/2017 at 5:05 AM  Between 7am to 7pm - Pager - [phone]. After 7pm go to [email or web address] - password [facility]  [facility] - Office  [phone]

## 2017-04-25 NOTE — ED Notes (Signed)
Pt was ambulated with unsteady gait to the bathroom with 2 staff assist.

## 2017-04-25 NOTE — ED Provider Notes (Signed)
[facility]-EMERGENCY DEPT Provider Note   CSN: [id number] Arrival date & time: 04/24/17  2347     History   Chief Complaint Chief Complaint  Patient presents with  . N/V    HPI Johnny [name] is a 83 y.o. male.  Patient is in 83 year old male with past medical history of coronary artery disease with CABG, prior CVA, hypertension, and macular degeneration with limited vision. He presents today for evaluation of dizziness. This started approximately 1 hour prior to presentation. He describes the dizziness as a spinning sensation that causes him to feel nauseated and he has vomited. It is worse with turning his head and changing position and partially relieved with rest. He denies any injury or trauma. He denies any headache, chest pain, or difficulty breathing.   The history is provided by the patient.  Dizziness  Quality:  Head spinning and imbalance Severity:  Moderate Onset quality:  Sudden Timing:  Constant Progression:  Unchanged Chronicity:  New Context: head movement   Relieved by:  Nothing Worsened by:  Turning head Ineffective treatments:  None tried Associated symptoms: no blood in stool, no chest pain, no headaches, no palpitations and no shortness of breath     Past Medical History:  Diagnosis Date  . BPH (benign prostatic hypertrophy)   . CVA (cerebral infarction)   . Diabetes mellitus (HCC)   . Gait disturbance   . Heart murmur   . HTN (hypertension)   . Macular degeneration   . Paresthesia   . Ptosis     Patient Active Problem List   Diagnosis Date Noted  . Bunion 07/26/2014  . Acute encephalopathy 01/03/2014  . Aortic valve replaced 01/03/2014  . CAD in native artery 01/03/2014  . Fever 01/02/2014  . Pain in lower limb 10/26/2013  . Onychomycosis 11/17/2012  . Pain in joint, ankle and foot 11/17/2012  . Aortic stenosis 02/25/2012  . Abnormal ECG 02/25/2012  . HTN (hypertension)   . Paresthesia   . Diabetes mellitus (HCC)   . Macular  degeneration   . CVA (cerebral infarction)   . Ptosis   . Gait disturbance     Past Surgical History:  Procedure Laterality Date  . COLONOSCOPY W/ POLYPECTOMY    . EYE SURGERY    . PROSTATE SURGERY         Home Medications    Prior to Admission medications   Medication Sig Start Date End Date Taking? Authorizing Provider  allopurinol (ZYLOPRIM) 100 MG tablet Take 200 mg by mouth at bedtime.  11/07/11   [provider]  amLODipine (NORVASC) 10 MG tablet Take 10 mg by mouth daily.    [provider]  aspirin EC 81 MG tablet Take 81 mg by mouth daily.    [provider]  docusate sodium (COLACE) 100 MG capsule Take 100 mg by mouth 2 (two) times daily.    [provider]  Empagliflozin-Linagliptin (GLYXAMBI) 25-5 MG TABS Take 1 tablet by mouth daily.    [provider]  ezetimibe (ZETIA) 10 MG tablet Take 10 mg by mouth at bedtime.    [provider]  ferrous fumarate (HEMOCYTE - 106 MG FE) 325 (106 FE) MG TABS tablet Take 1 tablet by mouth 2 (two) times daily.    [provider]  folic acid (FOLVITE) 1 MG tablet Take 1 mg by mouth daily.     [provider]  furosemide (LASIX) 40 MG tablet Take 40 mg by mouth 2 (two) times daily.  [provider]  hydrALAZINE (APRESOLINE) 50 MG tablet Take 50 mg by mouth 3 (three) times daily.  12/26/11   [provider]  insulin glargine (LANTUS) 100 UNIT/ML injection Inject 0.13-0.25 mLs (13-25 Units total) into the skin 2 (two) times daily. 13 units in the morning and 25 units at bedtime. 01/05/14   Regalado, Belkys A, MD  metoprolol (LOPRESSOR) 50 MG tablet Take 50 mg by mouth 2 (two) times daily.    [provider]  omeprazole-sodium bicarbonate (ZEGERID) 40-1100 MG per capsule Take 1 capsule by mouth daily before breakfast.    [provider]  prednisoLONE acetate (PRED FORTE) 1 % ophthalmic suspension Place 1 drop into the left eye 2 (two)  times daily.    [provider]  senna (SENOKOT) 8.6 MG TABS tablet Take 1 tablet by mouth at bedtime.    [provider]  Vitamin D, Ergocalciferol, (DRISDOL) 50000 UNITS CAPS capsule Take 50,000 Units by mouth every 7 (seven) days. Once a week for 4 weeks then once per month for 6 months    [provider]    Family History Family History  Problem Relation Age of Onset  . Breast cancer Sister   . Lung cancer Brother     Social History Social History  Substance Use Topics  . Smoking status: Former Smoker    Packs/day: 1.00    Years: 40.00    Types: Cigarettes  . Smokeless tobacco: Never Used  . Alcohol use No     Allergies   Heparin   Review of Systems Review of Systems  Respiratory: Negative for shortness of breath.   Cardiovascular: Negative for chest pain and palpitations.  Gastrointestinal: Negative for blood in stool.  Neurological: Positive for dizziness. Negative for headaches.  All other systems reviewed and are negative.    Physical Exam Updated Vital Signs BP (!) 172/75 (BP Location: Left Arm)   Pulse (!) 58   Temp 97.7 F (36.5 C) (Oral)   Resp 14   SpO2 95%   Physical Exam  Constitutional: He is oriented to person, place, and time. He appears well-developed and well-nourished. No distress.  HENT:  Head: Normocephalic and atraumatic.  Mouth/Throat: Oropharynx is clear and moist.  Eyes: Pupils are equal, round, and reactive to light. EOM are normal.  Neck: Normal range of motion. Neck supple.  Cardiovascular: Normal rate and regular rhythm.  Exam reveals no friction rub.   No murmur heard. Pulmonary/Chest: Effort normal and breath sounds normal. No respiratory distress. He has no wheezes. He has no rales.  Abdominal: Soft. Bowel sounds are normal. He exhibits no distension. There is no tenderness.  Musculoskeletal: Normal range of motion. He exhibits no edema.  Neurological: He is alert and oriented to person, place, and  time. No cranial nerve deficit. He exhibits normal muscle tone. Coordination normal.  Skin: Skin is warm and dry. He is not diaphoretic.  Nursing note and vitals reviewed.    ED Treatments / Results  Labs (all labs ordered are listed, but only abnormal results are displayed) Labs Reviewed  COMPREHENSIVE METABOLIC PANEL  CBC WITH DIFFERENTIAL/PLATELET    EKG  EKG Interpretation None       Radiology No results found.  Procedures Procedures (including critical care time)  Medications Ordered in ED Medications  sodium chloride 0.9 % bolus 500 mL (not administered)  ondansetron (ZOFRAN) injection 4 mg (not administered)  meclizine (ANTIVERT) tablet 25 mg (not administered)     Initial Impression /  Assessment and Plan / ED Course  I have reviewed the triage vital signs and the nursing notes.  Pertinent labs & imaging results that were available during my care of the patient were reviewed by me and considered in my medical decision making (see chart for details).  Patient presenting with dizziness that sounds like peripheral vertigo.Head CT was negative and laboratory studies are essentially unremarkable. He was given fluids, meclizine, and Valium with no improvement. He remains extremely ataxic and unsteady. For this reason, I feel as though the patient should be admitted for further workup including MRI to rule out posterior circulation CVA. I spoke with Dr. Selena Batten who agrees to admit.  Final Clinical Impressions(s) / ED Diagnoses   Final diagnoses:  None    New Prescriptions New Prescriptions   No medications on file     Geoffery Lyons, MD 04/25/17 [phone]

## 2017-04-25 NOTE — Evaluation (Signed)
Physical Therapy Evaluation Patient Details Name: Johnny [name] MRN: [id number] DOB: 19-Dec-1932 Today's Date: 04/25/2017   History of Present Illness  Lemoyne Nestor  is a 83 y.o. male, w hypertension, hyperlipidemia, Dm2,  CVA, macular degeneration, glaucoma apparently c/o vertigo; CT head neg  Clinical Impression  Pt admitted with above diagnosis. Pt currently with functional limitations due to the deficits listed below (see PT Problem List).  Pt will benefit from skilled PT to increase their independence and safety with mobility to allow discharge to the venue listed below.   Pt does not exactly describe vertigo but c/o of changes related to vision;  Difficult to test if any true vestibular symptoms d/t pt blindness;  Pt does present with LE ataxia and was unable to amb d/t poor static balance; recommend SNF at this time, will follow      Follow Up Recommendations SNF (vs HHPT depending on progress)    Equipment Recommendations  None recommended by PT    Recommendations for Other Services       Precautions / Restrictions Precautions Precautions: Fall Precaution Comments: blind Restrictions Weight Bearing Restrictions: No      Mobility  Bed Mobility Overal bed mobility: Needs Assistance Bed Mobility: Supine to Sit;Sit to Supine     Supine to sit: Min guard Sit to supine: Min guard   General bed mobility comments: incr time, min/guard for safety and cues for direction d/t low vision  Transfers Overall transfer level: Needs assistance Equipment used: Rolling walker (2 wheeled) Transfers: Sit to/from Stand Sit to Stand: Mod assist         General transfer comment: 2 trials; mod assist to rise and stabilize, hands placed on RW d/t low vision  Ambulation/Gait             General Gait Details: NT with +1 assist  Stairs            Wheelchair Mobility    Modified Rankin (Stroke Patients Only)       Balance Overall balance assessment: Needs  assistance Sitting-balance support: Single extremity supported;Feet supported Sitting balance-Leahy Scale: Fair     Standing balance support: Bilateral upper extremity supported Standing balance-Leahy Scale: Poor Standing balance comment: ataxic in standing when unilateral stance attempted, incr postural sway in static stand Single Leg Stance - Right Leg: 0 Single Leg Stance - Left Leg: 0                         Pertinent Vitals/Pain Pain Assessment: No/denies pain    Home Living Family/patient expects to be discharged to:: Private residence Living Arrangements: Spouse/significant other Available Help at Discharge: Family Type of Home: House Home Access: Stairs to enter   Secretary/administrator of Steps: 2 Home Layout: One level Home Equipment: Environmental consultant - 2 wheels;Bedside commode;Grab bars - tub/shower;Cane - single point;Shower seat;Wheelchair - manual Additional Comments: dtr reports pt with general decline over the last few mos, pt not moving around as much    Prior Function Level of Independence: Independent;Independent with assistive device(s)         Comments: amb with cane     Hand Dominance   Dominant Hand: Right    Extremity/Trunk Assessment   Upper Extremity Assessment Upper Extremity Assessment: Overall WFL for tasks assessed    Lower Extremity Assessment Lower Extremity Assessment: RLE deficits/detail;LLE deficits/detail (strength grossly WFL) RLE Sensation: decreased proprioception RLE Coordination: decreased fine motor;decreased gross motor LLE Sensation: decreased proprioception LLE Coordination: decreased  fine motor;decreased gross motor       Communication   Communication: No difficulties  Cognition Arousal/Alertness: Awake/alert Behavior During Therapy: WFL for tasks assessed/performed Overall Cognitive Status: Within Functional Limits for tasks assessed                                        General Comments       Exercises     Assessment/Plan    PT Assessment Patient needs continued PT services  PT Problem List Decreased balance;Decreased mobility;Decreased safety awareness       PT Treatment Interventions DME instruction;Gait training;Functional mobility training;Therapeutic activities;Therapeutic exercise;Patient/family education    PT Goals (Current goals can be found in the Care Plan section)  Acute Rehab PT Goals Patient Stated Goal: return to walking and prior level of function PT Goal Formulation: With patient/family Time For Goal Achievement: 05/02/17 Potential to Achieve Goals: Good    Frequency Min 3X/week   Barriers to discharge        Co-evaluation               AM-PAC PT "6 Clicks" Daily Activity  Outcome Measure Difficulty turning over in bed (including adjusting bedclothes, sheets and blankets)?: Unable Difficulty moving from lying on back to sitting on the side of the bed? : Unable Difficulty sitting down on and standing up from a chair with arms (e.g., wheelchair, bedside commode, etc,.)?: Unable Help needed moving to and from a bed to chair (including a wheelchair)?: A Lot Help needed walking in hospital room?: A Lot Help needed climbing 3-5 steps with a railing? : Total 6 Click Score: 8    End of Session Equipment Utilized During Treatment: Gait belt Activity Tolerance: Patient tolerated treatment well Patient left: in bed;with call bell/phone within reach;with family/visitor present;with bed alarm set   PT Visit Diagnosis: Other abnormalities of gait and mobility (R26.89)    Time: 1610-9604 PT Time Calculation (min) (ACUTE ONLY): 28 min   Charges:   PT Evaluation $PT Eval Moderate Complexity: 1 Mod PT Treatments $Therapeutic Activity: 8-22 mins   PT G CodesDrucilla Chalet, PT Pager: [phone] 04/25/2017   [facility] 04/25/2017, 3:29 PM

## 2017-04-25 NOTE — Progress Notes (Signed)
Patient presented with vertigo, undergoing work up to r/o stroke.  No new complaints otherwise. No acute intracranial abnormality reported on ct scan of head.  Gen: pt in nad, alert and awake CV: no cyanosis Pulm: no increased wob, no wheezes  Will reassess patient next am.  Pt seen overnight. Will continue plan as outlined in H and P.  Andrea Ferrer, Pamala Hurry

## 2017-04-25 NOTE — Progress Notes (Signed)
MEDICATION RELATED CONSULT NOTE - FOLLOW UP   Pharmacy Consult for Empagliflozin-Linagliptin (GLYXAMBI) 25-5 MG  Indication: DM  Allergies  Allergen Reactions  . Heparin     Other reaction(s): Heparin-Induced Thrombocytopenia    Patient Measurements: Height: '5\' 8"'  (172.7 cm) Weight: 215 lb 6.2 oz (97.7 kg) IBW/kg (Calculated) : 68.4 Adjusted Body Weight:   Vital Signs: Temp: 97.7 F (36.5 C) (10/14 2354) Temp Source: Oral (10/14 2354) BP: 176/92 (10/15 0427) Pulse Rate: 70 (10/15 0427) Intake/Output from previous day: 10/14 0701 - 10/15 0700 In: 500 [IV Piggyback:500] Out: -  Intake/Output from this shift: Total I/O In: 500 [IV Piggyback:500] Out: -   Labs:  Recent Labs  04/25/17 0050  WBC 15.9*  HGB 18.1*  HCT 50.1  PLT 200  CREATININE 2.00*  ALBUMIN 4.5  PROT 8.1  AST 34  ALT 31  ALKPHOS 89  BILITOT 0.5   Estimated Creatinine Clearance: 32.3 mL/min (A) (by C-G formula based on SCr of 2 mg/dL (H)).   Microbiology: No results found for this or any previous visit (from the past 720 hour(s)).  Medications:  Prescriptions Prior to Admission  Medication Sig Dispense Refill Last Dose  . allopurinol (ZYLOPRIM) 100 MG tablet Take 200 mg by mouth at bedtime.    Taking  . amLODipine (NORVASC) 10 MG tablet Take 10 mg by mouth daily.   Taking  . aspirin EC 81 MG tablet Take 81 mg by mouth daily.   Taking  . docusate sodium (COLACE) 100 MG capsule Take 100 mg by mouth 2 (two) times daily.   Taking  . Empagliflozin-Linagliptin (GLYXAMBI) 25-5 MG TABS Take 1 tablet by mouth daily.   Taking  . ezetimibe (ZETIA) 10 MG tablet Take 10 mg by mouth at bedtime.   Taking  . ferrous fumarate (HEMOCYTE - 106 MG FE) 325 (106 FE) MG TABS tablet Take 1 tablet by mouth 2 (two) times daily.   Taking  . folic acid (FOLVITE) 1 MG tablet Take 1 mg by mouth daily.    Taking  . furosemide (LASIX) 40 MG tablet Take 40 mg by mouth 2 (two) times daily.   Taking  . hydrALAZINE  (APRESOLINE) 50 MG tablet Take 50 mg by mouth 3 (three) times daily.    Taking  . insulin glargine (LANTUS) 100 UNIT/ML injection Inject 0.13-0.25 mLs (13-25 Units total) into the skin 2 (two) times daily. 13 units in the morning and 25 units at bedtime. 10 mL 11 Taking  . metoprolol (LOPRESSOR) 50 MG tablet Take 50 mg by mouth 2 (two) times daily.   Taking  . omeprazole-sodium bicarbonate (ZEGERID) 40-1100 MG per capsule Take 1 capsule by mouth daily before breakfast.   Taking  . prednisoLONE acetate (PRED FORTE) 1 % ophthalmic suspension Place 1 drop into the left eye 2 (two) times daily.   Taking  . senna (SENOKOT) 8.6 MG TABS tablet Take 1 tablet by mouth at bedtime.   Taking  . Vitamin D, Ergocalciferol, (DRISDOL) 50000 UNITS CAPS capsule Take 50,000 Units by mouth every 7 (seven) days. Once a week for 4 weeks then once per month for 6 months   Taking    Assessment: Hold Criteria:  Acute renal failure  eGFR < 45 mL/min/1.73m  Diabetic ketoacidosis  Metabolic acidosis  NPO status  UTI  Dehydration  Volume depletion  Goal of Therapy:  Safe and effective use of medication  Plan:  Hold medication due to eGFR < 45. (32.3 per EPIC)  GTyler Deis  Lennie Dunnigan Crowford 04/25/2017,6:19 AM

## 2017-04-25 NOTE — Care Management Note (Signed)
Case Management Note  Patient Details  Name: Johnny [name] MRN: [id number] Date of Birth: [birth year]  Subjective/Objective:                  N/V veritgo  Action/Plan: Date:  April 25, 2017 Chart reviewed for concurrent status and case management needs.  Will continue to follow patient progress.  Discharge Planning: following for needs  Expected discharge date: April 28, 2017  Marcelle Smiling, BSN, [place], [place]   [phone]   Expected Discharge Date:  04/27/17               Expected Discharge Plan:  Home/Self Care  In-House Referral:     Discharge planning Services  CM Consult  Post Acute Care Choice:    Choice offered to:     DME Arranged:    DME Agency:     HH Arranged:    HH Agency:     Status of Service:  In process, will continue to follow  If discussed at Long Length of Stay Meetings, dates discussed:    Additional Comments:  Golda Acre, RN 04/25/2017, 9:50 AM

## 2017-04-26 ENCOUNTER — Observation Stay (HOSPITAL_COMMUNITY): Payer: Medicare Other

## 2017-04-26 DIAGNOSIS — R42 Dizziness and giddiness: Secondary | ICD-10-CM | POA: Diagnosis not present

## 2017-04-26 LAB — COMPREHENSIVE METABOLIC PANEL
ALBUMIN: 3.5 g/dL (ref 3.5–5.0)
ALK PHOS: 72 U/L (ref 38–126)
ALT: 26 U/L (ref 17–63)
ANION GAP: 9 (ref 5–15)
AST: 25 U/L (ref 15–41)
BILIRUBIN TOTAL: 0.5 mg/dL (ref 0.3–1.2)
BUN: 25 mg/dL — ABNORMAL HIGH (ref 6–20)
CO2: 25 mmol/L (ref 22–32)
Calcium: 9.4 mg/dL (ref 8.9–10.3)
Chloride: 104 mmol/L (ref 101–111)
Creatinine, Ser: 2.25 mg/dL — ABNORMAL HIGH (ref 0.61–1.24)
GFR, EST AFRICAN AMERICAN: 30 mL/min — AB (ref >60)
GFR, EST NON AFRICAN AMERICAN: 25 mL/min — AB (ref >60)
GLUCOSE: 110 mg/dL — AB (ref 65–99)
POTASSIUM: 3.9 mmol/L (ref 3.5–5.1)
Sodium: 138 mmol/L (ref 135–145)
TOTAL PROTEIN: 6.5 g/dL (ref 6.5–8.1)

## 2017-04-26 LAB — GLUCOSE, CAPILLARY
GLUCOSE-CAPILLARY: 114 mg/dL — AB (ref 65–99)
Glucose-Capillary: 216 mg/dL — ABNORMAL HIGH (ref 65–99)
Glucose-Capillary: 218 mg/dL — ABNORMAL HIGH (ref 65–99)
Glucose-Capillary: 209 mg/dL — ABNORMAL HIGH (ref 65–99)

## 2017-04-26 LAB — CBC
HEMATOCRIT: 44.5 % (ref 39.0–52.0)
HEMOGLOBIN: 15.3 g/dL (ref 13.0–17.0)
MCH: 29.1 pg (ref 26.0–34.0)
MCHC: 34.4 g/dL (ref 30.0–36.0)
MCV: 84.6 fL (ref 78.0–100.0)
Platelets: 178 10*3/uL (ref 150–400)
RBC: 5.26 MIL/uL (ref 4.22–5.81)
RDW: 14.3 % (ref 11.5–15.5)
WBC: 10.3 10*3/uL (ref 4.0–10.5)

## 2017-04-26 NOTE — Progress Notes (Signed)
PROGRESS NOTE    Johnny [name]  WUJ:[id number] DOB: [birth year] DOA: 04/24/2017 PCP: Darci Needle, MD    Brief Narrative:  83 y.o. male, w hypertension, hyperlipidemia, Dm2,  CVA, macular degeneration, glaucoma apparently c/o vertigo ,  Denies hearing loss or tinnitus.  No family history.  No recent illness   Assessment & Plan:   Principal Problem:   Vertigo - We will expand workup included carotid artery evaluation and MRI of brain orders placed today. - CT scan negative - if new stroke present will transfer to [facility] and consult neurology  Active Problems:   HTN (hypertension) - should stroke be ruled in will discontinueand allow permissive hypertension    Diabetes mellitus (HCC) - continue sliding scale insulin and Lantus - continue carb modified diet    CKD (chronic kidney disease) stage 3, GFR 30-59 ml/min (HCC) - Baseline creatinine appears to be 1.9 - 2.3   DVT prophylaxis: SCD's Code Status: Full Family Communication: no family at bedside.  Disposition Plan: Pending work up   Consultants:   None   Procedures: None   Antimicrobials: None   Subjective: Pt has no new complaints.   Objective: Vitals:   04/25/17 1429 04/25/17 2106 04/26/17 0521 04/26/17 1329  BP: (!) 176/77 (!) 144/65 (!) 150/76 (!) 158/76  Pulse: 65 64 65 62  Resp: Temp: 98 F (36.7 C) 98.1 F (36.7 C) 98.1 F (36.7 C) 98.3 F (36.8 C)  TempSrc: Oral Oral Oral Oral  SpO2: 94% 93% 94% 95%  Weight:      Height:        Intake/Output Summary (Last 24 hours) at 04/26/17 1608 Last data filed at 04/26/17 1332  Gross per 24 hour  Intake              600 ml  Output             2750 ml  Net            -2150 ml   Filed Weights   04/25/17 0559  Weight: 97.7 kg (215 lb 6.2 oz)    Examination:  General exam: Appears calm and comfortable, in nad. Respiratory system: Clear to auscultation. Respiratory effort normal. Cardiovascular system: S1 & S2 heard, RRR. No JVD,  murmurs, rubs, gallops or clicks. No pedal edema. Gastrointestinal system: Abdomen is nondistended, soft and nontender. No organomegaly or masses felt. Normal bowel sounds heard. Central nervous system: Pt legally blind, answers questions appropriately, no facial asymmetry Extremities: warm with positive pulses Skin: No rashes, lesions or ulcers  on limited exam Psychiatry: Judgement and insight appear normal. Mood & affect appropriate.     Data Reviewed: I have personally reviewed following labs and imaging studies  CBC:  Recent Labs Lab 04/25/17 0050 04/26/17 0623  WBC 15.9* 10.3  NEUTROABS 12.7*  --   HGB 18.1* 15.3  HCT 50.1 44.5  MCV 83.9 84.6  PLT 200 178   Basic Metabolic Panel:  Recent Labs Lab 04/25/17 0050 04/26/17 0623  NA 136 138  K 4.1 3.9  CL 101 104  CO2 21* 25  GLUCOSE 254* 110*  BUN 25* 25*  CREATININE 2.00* 2.25*  CALCIUM 9.9 9.4   GFR: Estimated Creatinine Clearance: 28.7 mL/min (A) (by C-G formula based on SCr of 2.25 mg/dL (H)). Liver Function Tests:  Recent Labs Lab 04/25/17 0050 04/26/17 0623  AST 34 25  ALT 31 26  ALKPHOS 89 72  BILITOT 0.5 0.5  PROT 8.1  6.5  ALBUMIN 4.5 3.5   No results for input(s): LIPASE, AMYLASE in the last 168 hours. No results for input(s): AMMONIA in the last 168 hours. Coagulation Profile: No results for input(s): INR, PROTIME in the last 168 hours. Cardiac Enzymes: No results for input(s): CKTOTAL, CKMB, CKMBINDEX, TROPONINI in the last 168 hours. BNP (last 3 results) No results for input(s): PROBNP in the last 8760 hours. HbA1C:  Recent Labs  04/25/17 0610  HGBA1C 7.0*   CBG:  Recent Labs Lab 04/25/17 1235 04/25/17 1643 04/25/17 2105 04/26/17 0722 04/26/17 1123  GLUCAP 272* 236* 247* 114* 216*   Lipid Profile: No results for input(s): CHOL, HDL, LDLCALC, TRIG, CHOLHDL, LDLDIRECT in the last 72 hours. Thyroid Function Tests: No results for input(s): TSH, T4TOTAL, FREET4, T3FREE,  THYROIDAB in the last 72 hours. Anemia Panel: No results for input(s): VITAMINB12, FOLATE, FERRITIN, TIBC, IRON, RETICCTPCT in the last 72 hours. Sepsis Labs: No results for input(s): PROCALCITON, LATICACIDVEN in the last 168 hours.  No results found for this or any previous visit (from the past 240 hour(s)).       Radiology Studies: Ct Head Wo Contrast  Result Date: 04/25/2017 CLINICAL DATA:  83 y/o  M; ataxia, stroke suspected. EXAM: CT HEAD WITHOUT CONTRAST TECHNIQUE: Contiguous axial images were obtained from the base of the skull through the vertex without intravenous contrast. COMPARISON:  01/02/2014 CT head FINDINGS: Brain: No evidence of acute infarction, hemorrhage, hydrocephalus, extra-axial collection or mass lesion/mass effect. Small chronic infarcts are present in right posterior caudate body, right cerebellar hemisphere, and right putamen. Stable moderate brain parenchymal volume loss and mild chronic microvascular ischemic changes. Vascular: Calcific atherosclerosis of carotid siphons. No hyperdense vessel identified. Skull: Normal. Negative for fracture or focal lesion. Sinuses/Orbits: No acute finding. Other: Bilateral intra-ocular lens replacement. IMPRESSION: 1. No acute intracranial abnormality identified. 2. Stable moderate brain parenchymal volume loss and mild chronic microvascular ischemic changes of the brain. 3. Interval small chronic infarction within the right superior cerebellar hemisphere. Electronically Signed   By: Mitzi Hansen M.D.   On: 04/25/2017 01:10        Scheduled Meds: . allopurinol  200 mg Oral QHS  . amLODipine  10 mg Oral Daily  . docusate sodium  100 mg Oral BID  . ferrous sulfate  325 mg Oral BID  . folic acid  1 mg Oral Daily  . furosemide  40 mg Oral Daily  . hydrALAZINE  50 mg Oral TID  . insulin aspart  0-5 Units Subcutaneous QHS  . insulin aspart  0-9 Units Subcutaneous TID WC  . insulin glargine  30 Units Subcutaneous  BID  . latanoprost  1 drop Both Eyes QHS  . metoprolol tartrate  50 mg Oral BID  . pantoprazole  40 mg Oral Daily  . senna  1 tablet Oral QHS  . sodium chloride flush  3 mL Intravenous Q12H   Continuous Infusions: . sodium chloride       LOS: 0 days    Time spent: > 35 minutes    Penny Pia, MD [facility] Pager [phone]  If 7PM-7AM, please contact night-coverage [email or web address] Password [email or web address] 04/26/2017, 4:08 PM

## 2017-04-27 ENCOUNTER — Observation Stay (HOSPITAL_COMMUNITY): Payer: Medicare Other

## 2017-04-27 DIAGNOSIS — N3000 Acute cystitis without hematuria: Secondary | ICD-10-CM | POA: Diagnosis not present

## 2017-04-27 DIAGNOSIS — Z794 Long term (current) use of insulin: Secondary | ICD-10-CM | POA: Diagnosis not present

## 2017-04-27 DIAGNOSIS — Z803 Family history of malignant neoplasm of breast: Secondary | ICD-10-CM | POA: Diagnosis not present

## 2017-04-27 DIAGNOSIS — I6340 Cerebral infarction due to embolism of unspecified cerebral artery: Secondary | ICD-10-CM | POA: Diagnosis present

## 2017-04-27 DIAGNOSIS — R270 Ataxia, unspecified: Secondary | ICD-10-CM | POA: Diagnosis not present

## 2017-04-27 DIAGNOSIS — R42 Dizziness and giddiness: Secondary | ICD-10-CM | POA: Diagnosis present

## 2017-04-27 DIAGNOSIS — Z801 Family history of malignant neoplasm of trachea, bronchus and lung: Secondary | ICD-10-CM | POA: Diagnosis not present

## 2017-04-27 DIAGNOSIS — Z952 Presence of prosthetic heart valve: Secondary | ICD-10-CM | POA: Diagnosis not present

## 2017-04-27 DIAGNOSIS — N400 Enlarged prostate without lower urinary tract symptoms: Secondary | ICD-10-CM | POA: Diagnosis present

## 2017-04-27 DIAGNOSIS — Z8673 Personal history of transient ischemic attack (TIA), and cerebral infarction without residual deficits: Secondary | ICD-10-CM | POA: Diagnosis not present

## 2017-04-27 DIAGNOSIS — Z888 Allergy status to other drugs, medicaments and biological substances status: Secondary | ICD-10-CM | POA: Diagnosis not present

## 2017-04-27 DIAGNOSIS — I639 Cerebral infarction, unspecified: Secondary | ICD-10-CM | POA: Diagnosis not present

## 2017-04-27 DIAGNOSIS — I2510 Atherosclerotic heart disease of native coronary artery without angina pectoris: Secondary | ICD-10-CM | POA: Diagnosis present

## 2017-04-27 DIAGNOSIS — Z951 Presence of aortocoronary bypass graft: Secondary | ICD-10-CM | POA: Diagnosis not present

## 2017-04-27 DIAGNOSIS — Z961 Presence of intraocular lens: Secondary | ICD-10-CM | POA: Diagnosis present

## 2017-04-27 DIAGNOSIS — E785 Hyperlipidemia, unspecified: Secondary | ICD-10-CM | POA: Diagnosis not present

## 2017-04-27 DIAGNOSIS — E1122 Type 2 diabetes mellitus with diabetic chronic kidney disease: Secondary | ICD-10-CM | POA: Diagnosis present

## 2017-04-27 DIAGNOSIS — K219 Gastro-esophageal reflux disease without esophagitis: Secondary | ICD-10-CM | POA: Diagnosis present

## 2017-04-27 DIAGNOSIS — E11311 Type 2 diabetes mellitus with unspecified diabetic retinopathy with macular edema: Secondary | ICD-10-CM | POA: Diagnosis present

## 2017-04-27 DIAGNOSIS — E1159 Type 2 diabetes mellitus with other circulatory complications: Secondary | ICD-10-CM | POA: Diagnosis not present

## 2017-04-27 DIAGNOSIS — N390 Urinary tract infection, site not specified: Secondary | ICD-10-CM | POA: Diagnosis not present

## 2017-04-27 DIAGNOSIS — I10 Essential (primary) hypertension: Secondary | ICD-10-CM | POA: Diagnosis not present

## 2017-04-27 DIAGNOSIS — R011 Cardiac murmur, unspecified: Secondary | ICD-10-CM | POA: Diagnosis present

## 2017-04-27 DIAGNOSIS — D72829 Elevated white blood cell count, unspecified: Secondary | ICD-10-CM | POA: Diagnosis not present

## 2017-04-27 DIAGNOSIS — Z7982 Long term (current) use of aspirin: Secondary | ICD-10-CM | POA: Diagnosis not present

## 2017-04-27 DIAGNOSIS — H548 Legal blindness, as defined in USA: Secondary | ICD-10-CM | POA: Diagnosis not present

## 2017-04-27 DIAGNOSIS — H3530 Unspecified macular degeneration: Secondary | ICD-10-CM | POA: Diagnosis present

## 2017-04-27 DIAGNOSIS — R7881 Bacteremia: Secondary | ICD-10-CM | POA: Diagnosis not present

## 2017-04-27 DIAGNOSIS — M109 Gout, unspecified: Secondary | ICD-10-CM | POA: Diagnosis present

## 2017-04-27 DIAGNOSIS — R509 Fever, unspecified: Secondary | ICD-10-CM | POA: Diagnosis not present

## 2017-04-27 DIAGNOSIS — Z87891 Personal history of nicotine dependence: Secondary | ICD-10-CM | POA: Diagnosis not present

## 2017-04-27 DIAGNOSIS — I129 Hypertensive chronic kidney disease with stage 1 through stage 4 chronic kidney disease, or unspecified chronic kidney disease: Secondary | ICD-10-CM | POA: Diagnosis present

## 2017-04-27 DIAGNOSIS — N183 Chronic kidney disease, stage 3 (moderate): Secondary | ICD-10-CM | POA: Diagnosis present

## 2017-04-27 DIAGNOSIS — E118 Type 2 diabetes mellitus with unspecified complications: Secondary | ICD-10-CM | POA: Diagnosis not present

## 2017-04-27 DIAGNOSIS — I350 Nonrheumatic aortic (valve) stenosis: Secondary | ICD-10-CM

## 2017-04-27 DIAGNOSIS — Z23 Encounter for immunization: Secondary | ICD-10-CM | POA: Diagnosis present

## 2017-04-27 LAB — ECHOCARDIOGRAM COMPLETE
Height: 68 in
WEIGHTICAEL: 3563 [oz_av]

## 2017-04-27 LAB — GLUCOSE, CAPILLARY
GLUCOSE-CAPILLARY: 89 mg/dL (ref 65–99)
GLUCOSE-CAPILLARY: 99 mg/dL (ref 65–99)
Glucose-Capillary: 177 mg/dL — ABNORMAL HIGH (ref 65–99)
Glucose-Capillary: 232 mg/dL — ABNORMAL HIGH (ref 65–99)

## 2017-04-27 MED ORDER — ASPIRIN 325 MG PO TABS
325.0000 mg | ORAL_TABLET | Freq: Every day | ORAL | Status: DC
  Administered 2017-04-27 – 2017-05-07 (×11): 325 mg via ORAL
  Filled 2017-04-27 (×11): qty 1

## 2017-04-27 MED ORDER — STROKE: EARLY STAGES OF RECOVERY BOOK
Freq: Once | Status: AC
  Administered 2017-04-27 – 2017-04-28 (×2)
  Filled 2017-04-27: qty 1

## 2017-04-27 MED ORDER — ASPIRIN 300 MG RE SUPP
300.0000 mg | Freq: Every day | RECTAL | Status: DC
  Filled 2017-04-27: qty 1

## 2017-04-27 NOTE — Progress Notes (Signed)
  Echocardiogram 2D Echocardiogram has been performed.  Johnny PartridgeBrooke S Jazmeen [name] 04/27/2017, 3:52 PM

## 2017-04-27 NOTE — Progress Notes (Signed)
[facility] PROGRESS NOTE  Johnny [name] OZD:[id number] DOB: 22-May-1933 DOA: 04/24/2017  PCP: Darci NeedleKohut, Walter, MD  Brief History/Interval Summary: 83 year old African-American male with a past medical history of previous stroke, hypertension, hyperlipidemia, diabetes, macular degeneration, history of glaucoma who is legally blind, presented with complaints of dizziness, lightheadedness, fall, which occurred on Sunday as a result of losing balance and feeling weak in his legs.  Reason for Visit: initially thought to be vertigo. Now noted to have acute stroke  Consultants: Neurology to see the patient once he transfers over to [facility].  Procedures: transthoracic echocardiogram and carotid Dopplers have been ordered  Antibiotics: none  Subjective/Interval History: Patient states that he is feeling better. Still feels unsteady on his legs. Unable to tell me if he has any dizziness and while lying on the bed. Denies any chest pain, shortness of breath.  ROS: denies any nausea or vomiting  Objective:  Vital Signs  Vitals:   04/27/17 0500 04/27/17 0550 04/27/17 0646 04/27/17 0919  BP:  (!) 189/72 (!) 162/66 (!) 146/62  Pulse:  (!) 58  83  Resp:  18    Temp:  99 F (37.2 C)    TempSrc:  Oral    SpO2:  95%    Weight: 101 kg (222 lb 11 oz)     Height:        Intake/Output Summary (Last 24 hours) at 04/27/17 1103 Last data filed at 04/27/17 0920  Gross per 24 hour  Intake              600 ml  Output             20 00 ml  Net            -1400 ml   Filed Weights   04/25/17 0559 04/27/17 0500  Weight: 97.7 kg (215 lb 6.2 oz) 101 kg (222 lb 11 oz)    General appearance: alert, cooperative, appears stated age and no distress Head: Normocephalic, without obvious abnormality, atraumatic Resp: clear to auscultation bilaterally Cardio: regular rate and rhythm, S1, S2 normal, no murmur, click, rub or gallop GI: soft, non-tender; bowel sounds normal; no masses,   no organomegaly Extremities: extremities normal, atraumatic, no cyanosis or edema Skin: Skin color, texture, turgor normal. No rashes or lesions Neurologic: awake alert. No obvious focal neurological deficits appreciated.  Lab Results:  Data Reviewed: I have personally reviewed following labs and imaging studies  CBC:  Recent Labs Lab 04/25/17 0050 04/26/17 0623  WBC 15.9* 10.3  NEUTROABS 12.7*  --   HGB 18.1* 15.3  HCT 50.1 44.5  MCV 83.9 84.6  PLT 200 178    Basic Metabolic Panel:  Recent Labs Lab 04/25/17 0050 04/26/17 0623  NA 136 138  K 4.1 3.9  CL 101 104  CO2 21* 25  GLUCOSE 254* 110*  BUN 25* 25*  CREATININE 2.00* 2.25*  CALCIUM 9.9 9.4    GFR: Estimated Creatinine Clearance: 29.1 mL/min (A) (by C-G formula based on SCr of 2.25 mg/dL (H)).  Liver Function Tests:  Recent Labs Lab 04/25/17 0050 04/26/17 0623  AST 34 25  ALT 31 26  ALKPHOS 89 72  BILITOT 0.5 0.5  PROT 8.1 6.5  ALBUMIN 4.5 3.5    HbA1C:  Recent Labs  04/25/17 0610  HGBA1C 7.0*    CBG:  Recent Labs Lab 04/26/17 0722 04/26/17 1123 04/26/17 1720 04/26/17 2203 04/27/17 0757  GLUCAP 114* 216* 218* 209* 89  Radiology Studies: Mr Brain Wo Contrast  Result Date: 04/26/2017 CLINICAL DATA:  Central vertigo EXAM: MRI HEAD WITHOUT CONTRAST TECHNIQUE: Multiplanar, multiecho pulse sequences of the brain and surrounding structures were obtained without intravenous contrast. COMPARISON:  Head CT 04/25/2017 Brain MRI 10/07/2011 FINDINGS: Brain: The midline structures are normal. There are scattered punctate foci of diffusion restriction within both cerebral hemispheres, more on the right than the left. The largest is located in the medial right cerebellum, measuring approximately 10 mm. There are other lesions in the left temporal lobe, right temporal lobe, right parietal white matter and right frontal lobe. Old right cerebellar infarct. Mild bilateral periventricular T2  hyperintensity. No intraparenchymal hematoma or chronic microhemorrhage. Brain volume is normal for age without lobar predominant atrophy. The dura is normal and there is no extra-axial collection. Vascular: Major intracranial arterial and venous sinus flow voids are preserved. Skull and upper cervical spine: The visualized skull base, calvarium, upper cervical spine and extracranial soft tissues are normal. Sinuses/Orbits: No fluid levels or advanced mucosal thickening. No mastoid or middle ear effusion. Normal orbits. Internal Auditory Canals: There is no cerebellopontine angle mass. The cochleae and semicircular canals are normal. No focal abnormality along the course of the 7th and 8th cranial nerves. Normal porus acusticus and vestibular aqueduct bilaterally. IMPRESSION: 1. Scattered punctate foci of acute ischemia within both cerebral hemispheres and the right cerebellum. The largest focus is in the medial right cerebellum measures 10 mm. The distribution is most consistent with a central cardiac or aortic embolic source. No hemorrhage or mass effect. 2. Old right cerebellar infarct and findings of chronic microvascular ischemia. 3. Normal MRI of the internal auditory canals and inner ear structures. Electronically Signed   By: Deatra Robinson M.D.   On: 04/26/2017 20:58     Medications:  Scheduled: .  stroke: mapping our early stages of recovery book   Does not apply Once  . allopurinol  200 mg Oral QHS  . amLODipine  10 mg Oral Daily  . aspirin  300 mg Rectal Daily   Or  . aspirin  325 mg Oral Daily  . docusate sodium  100 mg Oral BID  . ferrous sulfate  325 mg Oral BID  . folic acid  1 mg Oral Daily  . furosemide  40 mg Oral Daily  . hydrALAZINE  50 mg Oral TID  . insulin aspart  0-5 Units Subcutaneous QHS  . insulin aspart  0-9 Units Subcutaneous TID WC  . insulin glargine  30 Units Subcutaneous BID  . latanoprost  1 drop Both Eyes QHS  . metoprolol tartrate  50 mg Oral BID  .  pantoprazole  40 mg Oral Daily  . senna  1 tablet Oral QHS  . sodium chloride flush  3 mL Intravenous Q12H   Continuous: . sodium chloride     AVW:[id number] chloride, acetaminophen **OR** acetaminophen, hydrALAZINE, sodium chloride flush  Assessment/Plan:  Principal Problem:   Vertigo Active Problems:   HTN (hypertension)   Diabetes mellitus (HCC)   CKD (chronic kidney disease) stage 3, GFR 30-59 ml/min (HCC)    Acute stroke involving cerebellum, likely reason for his vertiginous symptoms Discussed with neurology who recommends transferring the patient over to [facility] for further workup. Echocardiogram, carotid Dopplers ordered. Check lipid panel. A1c 7.0. PT/OT evaluation. Speech therapy to see. Swallow screen. Patient with a previous history of stroke, but not noted to be on any antiplatelet agent. He states that he was taken off of aspirin after  surgery many years ago. He will be placed on aspirin for now. Further management per neurology.  History of essential hypertension. Monitor blood pressures closely. He is more than 48 hours out from his initial symptoms.  Diabetes mellitus type 2. Noted to be on Lantus and sliding scale insulin coverage. Continue to monitor CBGs.  Chronic kidney disease stage III. Baseline creatinine between 1.9-2.3. Continue to monitor urine output. Continue checking labs periodically. Will check labs tomorrow.  DVT Prophylaxis: sCDs    Code Status: full code  Family Communication: discussed with the patient and his wife  Disposition Plan: management as outlined above.    LOS: 0 days   [facility]  [facility] Pager [phone] 04/27/2017, 11:03 AM  If 7PM-7AM, please contact night-coverage at [email or web address], password [facility]

## 2017-04-27 NOTE — Progress Notes (Addendum)
[facility]. [facility]

## 2017-04-27 NOTE — Progress Notes (Signed)
[facility]  No complaints noted

## 2017-04-27 NOTE — Progress Notes (Signed)
PT Cancellation Note  Patient Details Name: Ignacia FellingSolomon B Castell MRN: [id number] DOB: [birth year]   Cancelled Treatment:     RN stated pt is transferring to [facility]   Felecia ShellingLori Teyah Rossy  PTA [facility]  Acute  Rehab Pager      [phone]

## 2017-04-27 NOTE — Care Management Note (Signed)
Case Management Note  Patient Details  Name: Johnny [name] MRN: [id number] Date of Birth: 06/22/1933  Subjective/Objective:                  Poss cva  Action/Plan: Date:  April 27, 2017 Chart reviewed for concurrent status and case management needs.  Will continue to follow patient progress.  Discharge Planning: following for needs  Expected discharge date: [id number]  Johnny [name], BSN, [place]3, [place]M   [phone]   Expected Discharge Date:  04/27/17               Expected Discharge Plan:  Home/Self Care  In-House Referral:     Discharge planning Services  CM Consult  Post Acute Care Choice:    Choice offered to:     DME Arranged:    DME Agency:     HH Arranged:    HH Agency:     Status of Service:  In process, will continue to follow  If discussed at Long Length of Stay Meetings, dates discussed:    Additional Comments:  [name], [name] [name], RN 04/27/2017, 9:51 AM

## 2017-04-27 NOTE — Progress Notes (Signed)
VASCULAR LAB PRELIMINARY  PRELIMINARY  PRELIMINARY  PRELIMINARY  Carotid duplex completed.    Preliminary report:  Extremely difficult due to high bifurcation. Bilateral:  1-39% ICA stenosis.  Vertebral artery flow is antegrade.     Carmencita Cusic, RVS 04/27/2017, 2:36 PM

## 2017-04-28 ENCOUNTER — Inpatient Hospital Stay (HOSPITAL_COMMUNITY): Payer: Medicare Other

## 2017-04-28 DIAGNOSIS — R270 Ataxia, unspecified: Secondary | ICD-10-CM

## 2017-04-28 LAB — GLUCOSE, CAPILLARY
GLUCOSE-CAPILLARY: 136 mg/dL — AB (ref 65–99)
GLUCOSE-CAPILLARY: 169 mg/dL — AB (ref 65–99)
GLUCOSE-CAPILLARY: 208 mg/dL — AB (ref 65–99)
Glucose-Capillary: 152 mg/dL — ABNORMAL HIGH (ref 65–99)

## 2017-04-28 NOTE — Evaluation (Signed)
Speech Language Pathology Evaluation Patient Details Name: Johnny [name] MRN: [id number] DOB: 13-Nov-1932 Today's Date: 04/28/2017 Time: [id number] SLP Time Calculation (min) (ACUTE ONLY): 19 min  Problem List:  Patient Active Problem List   Diagnosis Date Noted  . Acute ischemic stroke (HCC) 04/27/2017  . Stroke (cerebrum) (HCC) 04/27/2017  . Vertigo 04/25/2017  . CKD (chronic kidney disease) stage 3, GFR 30-59 ml/min (HCC) 04/25/2017  . Bunion 07/26/2014  . Acute encephalopathy 01/03/2014  . Aortic valve replaced 01/03/2014  . CAD in native artery 01/03/2014  . Fever 01/02/2014  . Pain in lower limb 10/26/2013  . Onychomycosis 11/17/2012  . Pain in joint, ankle and foot 11/17/2012  . Aortic stenosis 02/25/2012  . Abnormal ECG 02/25/2012  . HTN (hypertension)   . Paresthesia   . Diabetes mellitus (HCC)   . Macular degeneration   . CVA (cerebral infarction)   . Ptosis   . Gait disturbance    Past Medical History:  Past Medical History:  Diagnosis Date  . Aortic stenosis   . BPH (benign prostatic hypertrophy)   . Branch retinal vein occlusion 08/01/2013  . CAD (coronary artery disease)   . CVA (cerebral infarction)   . Diabetes mellitus (HCC)   . Diabetic retinopathy (HCC)   . Gait disturbance   . GERD (gastroesophageal reflux disease)   . Gout   . Heart murmur   . HTN (hypertension)   . Macular degeneration   . Macular degeneration   . Macular edema   . Paresthesia   . Pseudophakia of right eye   . Ptosis    Past Surgical History:  Past Surgical History:  Procedure Laterality Date  . AORTIC VALVE REPLACEMENT  03/26/2013  . CATARACT EXTRACTION W/ INTRAOCULAR LENS IMPLANT  11/01/2013  . CATARACT EXTRACTION W/ INTRAOCULAR LENS IMPLANT Right 12/18/2009  . COLONOSCOPY W/ POLYPECTOMY    . CORONARY ARTERY BYPASS GRAFT  03/26/2013  . EYE SURGERY  03/19/2016   Ocular tube shunt insertion  . PARS PLANA VITRECTOMY  03/19/2016  . PARS PLANA VITRECTOMY  11/08/2013   . PARS PLANA VITRECTOMY  08/02/2012  . PROSTATE SURGERY     HPI:  Pt is an 83 y.o.malewith a history of HTN, HLD, DM2,CVA, macular degeneration andglaucoma who presented on 10/15 with vertigo. He deniedhearing loss or tinnitus. MRI obtained on 04/26/17 revealed scattered punctate foci of acute ischemia within both cerebral hemispheres and the right cerebellum. The largest focus was in the medial right cerebellum; old right cerebellar infarct and findings of chronic microvascular ischemia also shown.    Assessment / Plan / Recommendation Clinical Impression  Pt demonstrates speech, language and cognition WFL; when telling a story, pt occasionally lost his train of thought but indicated no other cause for concern with attention during SLP evaluation. Pt completed the [facility] with 100% accuracy on all tasks assessed; visuospatial and confrontation naming tasks were not assessed 2/2 pt's baseline visual deficits (legally blind in both eyes). No SLP f/u needed at this time, will sign off.      SLP Assessment  SLP Recommendation/Assessment: Patient does not need any further Speech Lanaguage Pathology Services SLP Visit Diagnosis: Frontal lobe and executive function deficit Frontal lobe and executive function deficit following: Cerebral infarction    Follow Up Recommendations  None    Frequency and Duration           SLP Evaluation Cognition  Overall Cognitive Status: Within Functional Limits for tasks assessed Orientation Level: Oriented X4  Comprehension  Auditory Comprehension Overall Auditory Comprehension: Appears within functional limits for tasks assessed Visual Recognition/Discrimination Discrimination: Not tested Reading Comprehension Reading Status: Not tested    Expression Expression Primary Mode of Expression: Verbal Verbal Expression Overall Verbal Expression: Appears within functional limits for tasks assessed Other Verbal Expression Comments: Occasionally  loses train of thought when telling story Written Expression Dominant Hand: Right Written Expression: Not tested   Oral / Motor  Oral Motor/Sensory Function Overall Oral Motor/Sensory Function: Within functional limits Motor Speech Overall Motor Speech: Appears within functional limits for tasks assessed   GO                    Carmela Rima, Student SLP 04/28/2017, 12:20 PM

## 2017-04-28 NOTE — Progress Notes (Signed)
Dr. Benjamine MolaVann paged to follow up on Neuro consult recommendations from earlier this am (MRA head & TEE). Will continue to monitor.

## 2017-04-28 NOTE — Consult Note (Signed)
Referring Physician: Dr. Maryland Pink    Chief Complaint: New stroke seen on MRI  HPI: Johnny [name] is an 83 y.o. male with a history of HTN, HLD, DM2,CVA, macular degeneration and glaucoma who presented on 10/15 with vertigo. He denied hearing loss or tinnitus.   MRI obtained on 04/26/17 revealed scattered punctate foci of acute ischemia within both cerebral hemispheres and the right cerebellum. The largest focus was in the medial right cerebellum, measuring 10 mm. The distribution was most consistent with a central cardiac or aortic embolic source. Also seen was an old right cerebellar infarct and findings of chronic microvascular ischemia.  Carotid ultrasound obtained on 04/27/17 reveals bilateral 1% TO 39% ICA stenoses. Vertebral artery flow is antegrade.  Echocardiogram obtained on 04/27/17 revealed normal LV function; mild diastolic dysfunction with elevated LV filling pressure; moderate LVH; s/p AVR with mean gradient of 15 mmHg and no AI.   Past Medical History:  Diagnosis Date  . Aortic stenosis   . BPH (benign prostatic hypertrophy)   . Branch retinal vein occlusion 08/01/2013  . CAD (coronary artery disease)   . CVA (cerebral infarction)   . Diabetes mellitus ([place])   . Diabetic retinopathy ([place])   . Gait disturbance   . GERD (gastroesophageal reflux disease)   . Gout   . Heart murmur   . HTN (hypertension)   . Macular degeneration   . Macular degeneration   . Macular edema   . Paresthesia   . Pseudophakia of right eye   . Ptosis     Past Surgical History:  Procedure Laterality Date  . AORTIC VALVE REPLACEMENT  03/26/2013  . CATARACT EXTRACTION W/ INTRAOCULAR LENS IMPLANT  11/01/2013  . CATARACT EXTRACTION W/ INTRAOCULAR LENS IMPLANT Right 12/18/2009  . COLONOSCOPY W/ POLYPECTOMY    . CORONARY ARTERY BYPASS GRAFT  03/26/2013  . EYE SURGERY  03/19/2016   Ocular tube shunt insertion  . PARS PLANA VITRECTOMY  03/19/2016  . PARS PLANA VITRECTOMY  11/08/2013  .  PARS PLANA VITRECTOMY  08/02/2012  . PROSTATE SURGERY      Family History  Problem Relation Age of Onset  . Breast cancer Sister   . Lung cancer Brother    Social History:  reports that he has quit smoking. His smoking use included Cigarettes. He has a 40.00 pack-year smoking history. He has never used smokeless tobacco. He reports that he does not drink alcohol or use drugs.  Allergies:  Allergies  Allergen Reactions  . Glyxambi [Empagliflozin-Linagliptin] Other (See Comments)    Lethargy, slurring speaking.   . Heparin     Other reaction(s): Heparin-Induced Thrombocytopenia    Medications:  Prior to Admission:  Prescriptions Prior to Admission  Medication Sig Dispense Refill Last Dose  . allopurinol (ZYLOPRIM) 100 MG tablet Take 200 mg by mouth at bedtime.    04/24/2017 at Unknown time  . amLODipine (NORVASC) 10 MG tablet Take 10 mg by mouth daily.   04/24/2017 at Unknown time  . aspirin EC 81 MG tablet Take 81 mg by mouth daily.   04/24/2017 at Unknown time  . docusate sodium (COLACE) 100 MG capsule Take 100 mg by mouth 2 (two) times daily.   04/24/2017 at Unknown time  . ferrous sulfate 325 (65 FE) MG tablet Take 325 mg by mouth 2 (two) times daily.  0 04/24/2017 at Unknown time  . folic acid (FOLVITE) 1 MG tablet Take 1 mg by mouth daily.    04/24/2017 at Unknown time  . furosemide (  LASIX) 40 MG tablet Take 40 mg by mouth daily.    04/24/2017 at Unknown time  . HUMALOG KWIKPEN 100 UNIT/ML KiwkPen Inject 0-14 Units into the skin daily with supper. Sliding scale: <100 give 0 units; 101-199 give 8 units; 200-249 give 10 units; 250-300 give 12 units; >300 give 14 units.  0 04/24/2017 at Unknown time  . hydrALAZINE (APRESOLINE) 50 MG tablet Take 50 mg by mouth 3 (three) times daily.    04/24/2017 at Unknown time  . insulin glargine (LANTUS) 100 UNIT/ML injection Inject 0.13-0.25 mLs (13-25 Units total) into the skin 2 (two) times daily. 13 units in the morning and 25 units at  bedtime. (Patient taking differently: Inject 38 Units into the skin 2 (two) times daily. ) 10 mL 11 04/24/2017 at Unknown time  . latanoprost (XALATAN) 0.005 % ophthalmic solution Place 1 drop into both eyes at bedtime.     Marland Kitchen losartan (COZAAR) 25 MG tablet Take 25 mg by mouth at bedtime.  0   . metoprolol (LOPRESSOR) 50 MG tablet Take 50 mg by mouth 2 (two) times daily.   Taking  . Omeprazole-Sodium Bicarbonate (ZEGERID) 20-1100 MG CAPS capsule Take 1 capsule by mouth daily before breakfast.     . senna (SENOKOT) 8.6 MG TABS tablet Take 1 tablet by mouth at bedtime.   Taking  . [DISCONTINUED] prednisoLONE acetate (PRED FORTE) 1 % ophthalmic suspension Place 1 drop into the left eye 2 (two) times daily.   Taking   Scheduled: . allopurinol  200 mg Oral QHS  . amLODipine  10 mg Oral Daily  . aspirin  300 mg Rectal Daily   Or  . aspirin  325 mg Oral Daily  . docusate sodium  100 mg Oral BID  . ferrous sulfate  325 mg Oral BID  . folic acid  1 mg Oral Daily  . furosemide  40 mg Oral Daily  . hydrALAZINE  50 mg Oral TID  . insulin aspart  0-5 Units Subcutaneous QHS  . insulin aspart  0-9 Units Subcutaneous TID WC  . insulin glargine  30 Units Subcutaneous BID  . latanoprost  1 drop Both Eyes QHS  . metoprolol tartrate  50 mg Oral BID  . pantoprazole  40 mg Oral Daily  . senna  1 tablet Oral QHS  . sodium chloride flush  3 mL Intravenous Q12H    ROS: As per HPI.   Physical Examination: Blood pressure (!) 148/58, pulse 62, temperature 98.7 F (37.1 C), temperature source Oral, resp. rate 18, height '5\' 8"'  (1.727 m), weight 99.5 kg (219 lb 6.4 oz), SpO2 95 %.  HEENT: [place]/AT Lungs: Respirations unlabored Ext: Warm and well perfused  Neurologic Examination: Mental Status: Alert, oriented, thought content appropriate.  Speech fluent without evidence of aphasia.  Able to follow all commands without difficulty. Cranial Nerves: II:  Able to perceive light-dark only. Pupils equal at 3 mm  without clear reactivity to light. Left iris with defect at about 7 o'clock. III,IV, VI: Able to gaze to left, right and vertically. No nystagmus.  V,VII: Smile symmetric, facial light temp sensation normal bilaterally VIII: hearing intact to voice IX,X: No hypophonia XI: Symmetric XII: midline tongue extension Motor: Right : Upper extremity   5/5    Left:     Upper extremity   5/5  Lower extremity   5/5     Lower extremity   5/5 Normal tone throughout; no atrophy noted No pronator or parietal drift Sensory: Temp sensation  intact x 4.   Deep Tendon Reflexes:  1+ bilateral biceps, brachioradialis and patellar reflexes 0 achilles Toes equivocal bilaterally Cerebellar: Mild dysmetria with FNF on right, subtle/borderline dysmetria with FNF on left.  Gait: Deferred  Results for orders placed or performed during the hospital encounter of 04/24/17 (from the past 48 hour(s))  Comprehensive metabolic panel     Status: Abnormal   Collection Time: 04/26/17  6:23 AM  Result Value Ref Range   Sodium 138 135 - 145 mmol/L   Potassium 3.9 3.5 - 5.1 mmol/L   Chloride 104 101 - 111 mmol/L   CO2 25 22 - 32 mmol/L   Glucose, Bld 110 (H) 65 - 99 mg/dL   BUN 25 (H) 6 - 20 mg/dL   Creatinine, Ser 2.25 (H) 0.61 - 1.24 mg/dL   Calcium 9.4 8.9 - 10.3 mg/dL   Total Protein 6.5 6.5 - 8.1 g/dL   Albumin 3.5 3.5 - 5.0 g/dL   AST 25 15 - 41 U/L   ALT 26 17 - 63 U/L   Alkaline Phosphatase 72 38 - 126 U/L   Total Bilirubin 0.5 0.3 - 1.2 mg/dL   GFR calc non Af Amer 25 (L) >60 mL/min   GFR calc Af Amer 30 (L) >60 mL/min    Comment: (NOTE) The eGFR has been calculated using the CKD EPI equation. This calculation has not been validated in all clinical situations. eGFR's persistently <60 mL/min signify possible Chronic Kidney Disease.    Anion gap 9 5 - 15  CBC     Status: None   Collection Time: 04/26/17  6:23 AM  Result Value Ref Range   WBC 10.3 4.0 - 10.5 K/uL   RBC 5.26 4.22 - 5.81 MIL/uL    Hemoglobin 15.3 13.0 - 17.0 g/dL   HCT 44.5 39.0 - 52.0 %   MCV 84.6 78.0 - 100.0 fL   MCH 29.1 26.0 - 34.0 pg   MCHC 34.4 30.0 - 36.0 g/dL   RDW 14.3 11.5 - 15.5 %   Platelets 178 150 - 400 K/uL  Glucose, capillary     Status: Abnormal   Collection Time: 04/26/17  7:22 AM  Result Value Ref Range   Glucose-Capillary 114 (H) 65 - 99 mg/dL  Glucose, capillary     Status: Abnormal   Collection Time: 04/26/17 11:23 AM  Result Value Ref Range   Glucose-Capillary 216 (H) 65 - 99 mg/dL  Glucose, capillary     Status: Abnormal   Collection Time: 04/26/17  5:20 PM  Result Value Ref Range   Glucose-Capillary 218 (H) 65 - 99 mg/dL  Glucose, capillary     Status: Abnormal   Collection Time: 04/26/17 10:03 PM  Result Value Ref Range   Glucose-Capillary 209 (H) 65 - 99 mg/dL  Glucose, capillary     Status: None   Collection Time: 04/27/17  7:57 AM  Result Value Ref Range   Glucose-Capillary 89 65 - 99 mg/dL  Glucose, capillary     Status: Abnormal   Collection Time: 04/27/17 11:46 AM  Result Value Ref Range   Glucose-Capillary 177 (H) 65 - 99 mg/dL  Glucose, capillary     Status: None   Collection Time: 04/27/17  4:40 PM  Result Value Ref Range   Glucose-Capillary 99 65 - 99 mg/dL  Glucose, capillary     Status: Abnormal   Collection Time: 04/27/17 10:08 PM  Result Value Ref Range   Glucose-Capillary 232 (H) 65 - 99 mg/dL  Mr Brain Wo Contrast  Result Date: 04/26/2017 CLINICAL DATA:  Central vertigo EXAM: MRI HEAD WITHOUT CONTRAST TECHNIQUE: Multiplanar, multiecho pulse sequences of the brain and surrounding structures were obtained without intravenous contrast. COMPARISON:  Head CT 04/25/2017 Brain MRI 10/07/2011 FINDINGS: Brain: The midline structures are normal. There are scattered punctate foci of diffusion restriction within both cerebral hemispheres, more on the right than the left. The largest is located in the medial right cerebellum, measuring approximately 10 mm. There are  other lesions in the left temporal lobe, right temporal lobe, right parietal white matter and right frontal lobe. Old right cerebellar infarct. Mild bilateral periventricular T2 hyperintensity. No intraparenchymal hematoma or chronic microhemorrhage. Brain volume is normal for age without lobar predominant atrophy. The dura is normal and there is no extra-axial collection. Vascular: Major intracranial arterial and venous sinus flow voids are preserved. Skull and upper cervical spine: The visualized skull base, calvarium, upper cervical spine and extracranial soft tissues are normal. Sinuses/Orbits: No fluid levels or advanced mucosal thickening. No mastoid or middle ear effusion. Normal orbits. Internal Auditory Canals: There is no cerebellopontine angle mass. The cochleae and semicircular canals are normal. No focal abnormality along the course of the 7th and 8th cranial nerves. Normal porus acusticus and vestibular aqueduct bilaterally. IMPRESSION: 1. Scattered punctate foci of acute ischemia within both cerebral hemispheres and the right cerebellum. The largest focus is in the medial right cerebellum measures 10 mm. The distribution is most consistent with a central cardiac or aortic embolic source. No hemorrhage or mass effect. 2. Old right cerebellar infarct and findings of chronic microvascular ischemia. 3. Normal MRI of the internal auditory canals and inner ear structures. Electronically Signed   By: Ulyses Jarred M.D.   On: 04/26/2017 20:58    Assessment: 83 y.o. male with scattered punctate foci of acute ischemia within both cerebral hemispheres and the right cerebellum 1. Cardioembolic source is suspected. Initial stroke work up so far is unrevealing 2. Elevated BUN/Cr with low eGFR 3. Stroke Risk Factors - CAD, DM, HTN, prior stroke 4. Bilateral blindness secondary to macular degeneration and glaucoma. Able to perceive light/dark only.  Recommendations: 1. MRA head to evaluate for possible  intracranial atherosclerotic disease 2. TEE to rule out possible thrombus not detectable on initial TTE 3. Cardiac telemetry. If negative for paroxysmal a-fib during this stay, outpatient Holter monitoring or a loop recorder may be indicated 4. PT/OT consults 5. Agree with escalating ASA 81 mg qd (home dose) to 325 mg. If TEE shows mural thrombus or if paroxysmal atrial fibrillation is diagnosed with telemetry, then will need to be switched to anticoagulation.  6. Given the patient's advanced age, benefits of starting a statin may be outweighed by risks. Hold off on a statin for now.  7. BP management. Out of permissive HTN time window.   '@Electronically'  signed: Dr. Kerney Elbe 04/28/2017, 5:26 AM

## 2017-04-28 NOTE — Progress Notes (Signed)
PROGRESS NOTE    Johnny [name]  ZOX:[id number] DOB: June 27, 1933 DOA: 04/24/2017 PCP: Darci Needle, MD   Outpatient Specialists:     Brief Narrative:  Johnny [name]  is a 83 y.o. male, w hypertension, hyperlipidemia, Dm2,  CVA, macular degeneration, glaucoma apparently c/o vertigo ,  Denies hearing loss or tinnitus.  No family history.  No recent illness.     Assessment & Plan:   Principal Problem:   Acute ischemic stroke (HCC) Active Problems:   HTN (hypertension)   Diabetes mellitus (HCC)   Vertigo   CKD (chronic kidney disease) stage 3, GFR 30-59 ml/min (HCC)   Stroke (cerebrum) (HCC)   Acute stroke involving cerebellum, likely reason for his vertiginous symptoms MRA pending  Echocardiogram: Normal LV function; mild diastolic dysfunction with elevated LV filling pressure; moderate LVH; s/p AVR with mean gradient of 15 mmHg and no AI.   carotid Dopplers: Extremely difficult due to high bifurcation. Bilateral:  1-39% ICA stenosis.  Vertebral artery flow is antegrade  lipid panel. A1c 7.0. PT/OT: SNF ASA -discussed with Dr. Pearlean Brownie- prob small vessel disease, no need for TEE  History of essential hypertension. Monitor blood pressures closely. He is more than 48 hours out from his initial symptoms.  Diabetes mellitus type 2. Noted to be on Lantus and sliding scale insulin coverage. Continue to monitor CBGs. HgbA1c: 7  Chronic kidney disease stage III. Baseline creatinine between 1.9-2.3.    DVT prophylaxis:  SCD's  Code Status: Full Code   Family Communication:   Disposition Plan:     Consultants:   neuro   Subjective: No complaints this AM  Objective: Vitals:   04/28/17 0030 04/28/17 0230 04/28/17 0416 04/28/17 0954  BP: (!) 152/61 (!) 142/57 (!) 148/58 (!) 142/61  Pulse: 64 61 62 73  Resp: 18 16 18 20   Temp: 98.4 F (36.9 C) 98.2 F (36.8 C) 98.7 F (37.1 C) 98.7 F (37.1 C)  TempSrc: Oral Oral Oral Oral  SpO2: 94% 93% 95% 97%    Weight:   99.5 kg (219 lb 6.4 oz)   Height:        Intake/Output Summary (Last 24 hours) at 04/28/17 1453 Last data filed at 04/28/17 1135  Gross per 24 hour  Intake              660 ml  Output             2400 ml  Net            -1740 ml   Filed Weights   04/25/17 0559 04/27/17 0500 04/28/17 0416  Weight: 97.7 kg (215 lb 6.2 oz) 101 kg (222 lb 11 oz) 99.5 kg (219 lb 6.4 oz)    Examination:  General exam: Appears calm and comfortable  Respiratory system: Clear to auscultation. Respiratory effort normal. Cardiovascular system: S1 & S2 heard, RRR. No JVD, murmurs, rubs, gallops or clicks. No pedal edema. Gastrointestinal system: Abdomen is nondistended, soft and nontender. No organomegaly or masses felt. Normal bowel sounds heard. Central nervous system: Alert and oriented. No focal neurological deficits. Extremities: Symmetric 5 x 5 power. Skin: No rashes, lesions or ulcers Psychiatry: Judgement and insight appear normal. Mood & affect appropriate.     Data Reviewed: I have personally reviewed following labs and imaging studies  CBC:  Recent Labs Lab 04/25/17 0050 04/26/17 0623  WBC 15.9* 10.3  NEUTROABS 12.7*  --   HGB 18.1* 15.3  HCT 50.1 44.5  MCV 83.9 84.6  PLT  200 178   Basic Metabolic Panel:  Recent Labs Lab 04/25/17 0050 04/26/17 0623  NA 136 138  K 4.1 3.9  CL 101 104  CO2 21* 25  GLUCOSE 254* 110*  BUN 25* 25*  CREATININE 2.00* 2.25*  CALCIUM 9.9 9.4   GFR: Estimated Creatinine Clearance: 28.9 mL/min (A) (by C-G formula based on SCr of 2.25 mg/dL (H)). Liver Function Tests:  Recent Labs Lab 04/25/17 0050 04/26/17 0623  AST 34 25  ALT 31 26  ALKPHOS 89 72  BILITOT 0.5 0.5  PROT 8.1 6.5  ALBUMIN 4.5 3.5   No results for input(s): LIPASE, AMYLASE in the last 168 hours. No results for input(s): AMMONIA in the last 168 hours. Coagulation Profile: No results for input(s): INR, PROTIME in the last 168 hours. Cardiac Enzymes: No results  for input(s): CKTOTAL, CKMB, CKMBINDEX, TROPONINI in the last 168 hours. BNP (last 3 results) No results for input(s): PROBNP in the last 8760 hours. HbA1C: No results for input(s): HGBA1C in the last 72 hours. CBG:  Recent Labs Lab 04/27/17 1146 04/27/17 1640 04/27/17 2208 04/28/17 0605 04/28/17 1137  GLUCAP 177* 99 232* 136* 152*   Lipid Profile: No results for input(s): CHOL, HDL, LDLCALC, TRIG, CHOLHDL, LDLDIRECT in the last 72 hours. Thyroid Function Tests: No results for input(s): TSH, T4TOTAL, FREET4, T3FREE, THYROIDAB in the last 72 hours. Anemia Panel: No results for input(s): VITAMINB12, FOLATE, FERRITIN, TIBC, IRON, RETICCTPCT in the last 72 hours. Urine analysis:    Component Value Date/Time   COLORURINE YELLOW 01/02/2014 2236   APPEARANCEUR CLEAR 01/02/2014 2236   LABSPEC 1.017 01/02/2014 2236   PHURINE 5.0 01/02/2014 2236   GLUCOSEU >1000 (A) 01/02/2014 2236   HGBUR NEGATIVE 01/02/2014 2236   BILIRUBINUR NEGATIVE 01/02/2014 2236   KETONESUR NEGATIVE 01/02/2014 2236   PROTEINUR 100 (A) 01/02/2014 2236   UROBILINOGEN 0.2 01/02/2014 2236   NITRITE NEGATIVE 01/02/2014 2236   LEUKOCYTESUR NEGATIVE 01/02/2014 2236    )No results found for this or any previous visit (from the past 240 hour(s)).    Anti-infectives    None       Radiology Studies: Mr Brain Wo Contrast  Result Date: 04/26/2017 CLINICAL DATA:  Central vertigo EXAM: MRI HEAD WITHOUT CONTRAST TECHNIQUE: Multiplanar, multiecho pulse sequences of the brain and surrounding structures were obtained without intravenous contrast. COMPARISON:  Head CT 04/25/2017 Brain MRI 10/07/2011 FINDINGS: Brain: The midline structures are normal. There are scattered punctate foci of diffusion restriction within both cerebral hemispheres, more on the right than the left. The largest is located in the medial right cerebellum, measuring approximately 10 mm. There are other lesions in the left temporal lobe, right  temporal lobe, right parietal white matter and right frontal lobe. Old right cerebellar infarct. Mild bilateral periventricular T2 hyperintensity. No intraparenchymal hematoma or chronic microhemorrhage. Brain volume is normal for age without lobar predominant atrophy. The dura is normal and there is no extra-axial collection. Vascular: Major intracranial arterial and venous sinus flow voids are preserved. Skull and upper cervical spine: The visualized skull base, calvarium, upper cervical spine and extracranial soft tissues are normal. Sinuses/Orbits: No fluid levels or advanced mucosal thickening. No mastoid or middle ear effusion. Normal orbits. Internal Auditory Canals: There is no cerebellopontine angle mass. The cochleae and semicircular canals are normal. No focal abnormality along the course of the 7th and 8th cranial nerves. Normal porus acusticus and vestibular aqueduct bilaterally. IMPRESSION: 1. Scattered punctate foci of acute ischemia within both cerebral hemispheres and  the right cerebellum. The largest focus is in the medial right cerebellum measures 10 mm. The distribution is most consistent with a central cardiac or aortic embolic source. No hemorrhage or mass effect. 2. Old right cerebellar infarct and findings of chronic microvascular ischemia. 3. Normal MRI of the internal auditory canals and inner ear structures. Electronically Signed   By: Deatra RobinsonKevin  Herman M.D.   On: 04/26/2017 20:58        Scheduled Meds: . allopurinol  200 mg Oral QHS  . amLODipine  10 mg Oral Daily  . aspirin  300 mg Rectal Daily   Or  . aspirin  325 mg Oral Daily  . docusate sodium  100 mg Oral BID  . ferrous sulfate  325 mg Oral BID  . folic acid  1 mg Oral Daily  . furosemide  40 mg Oral Daily  . hydrALAZINE  50 mg Oral TID  . insulin aspart  0-5 Units Subcutaneous QHS  . insulin aspart  0-9 Units Subcutaneous TID WC  . insulin glargine  30 Units Subcutaneous BID  . latanoprost  1 drop Both Eyes QHS  .  metoprolol tartrate  50 mg Oral BID  . pantoprazole  40 mg Oral Daily  . senna  1 tablet Oral QHS  . sodium chloride flush  3 mL Intravenous Q12H   Continuous Infusions: . sodium chloride       LOS: 1 day    Time spent: 35 min    Keilin Gamboa U Eamon Tantillo, DO [facility] Pager [phone]  If 7PM-7AM, please contact night-coverage [email or web address] Password [email or web address] 04/28/2017, 2:53 PM

## 2017-04-28 NOTE — Evaluation (Signed)
Occupational Therapy Evaluation Patient Details Name: Johnny [name] MRN: [id number] DOB: December 23, 1932 Today's Date: 04/28/2017    History of Present Illness Johnny [name]  is a 83 y.o. male, w hypertension, hyperlipidemia, Dm2,  CVA, macular degeneration, glaucoma apparently c/o vertigo; CT head neg, MRI obtained on 04/26/17 revealed scattered punctate foci of acute ischemia within both cerebral hemispheres and the right cerebellum; largest focus was in the medial right cerebellum, Also seen was an old right cerebellar infarct and findings of chronic microvascular ischemia; Pt transferred from [place] to [place] on 10/17   Clinical Impression   This 83 y/o M presents with the above. At baseline Pt is mod independent with ADLs and functional mobility using SPC, reports he receives spouse assist only for donning socks. Pt currently requires Mod-MaxA for stand pivot transfers at RW level, requires MaxA for LB ADLs. Pt presents with generalized weakness, impaired balance, and increased difficulty completing mobility and ADLs secondary to baseline visual deficits. Pt will benefit from continued acute OT services and recommend additional OT services in SNF setting prior to return home to maximize Pt's safety and independence with ADLs and functional mobility.     Follow Up Recommendations  SNF;Other (comment) (pending progress; may progress to [facility] )    Equipment Recommendations  Other (comment) (defer to next venue )           Precautions / Restrictions Precautions Precautions: Fall Precaution Comments: legally blind Restrictions Weight Bearing Restrictions: No      Mobility Bed Mobility Overal bed mobility: Needs Assistance Bed Mobility: Supine to Sit     Supine to sit: Min assist     General bed mobility comments: increased time, MinA for trunk support when initially sittng up, MinA and verbal cues to scoot L hip towards EOB  Transfers Overall transfer level: Needs  assistance Equipment used: Rolling walker (2 wheeled) Transfers: Sit to/from [employer] Sit to Stand: Max assist Stand pivot transfers: Mod assist       General transfer comment: Pt requires increased assist to boost into standing and to steady initially; HOH assist to place hand on RW after standing; assist for sequencing RW with stand pivot to transfer to recliner, provided verbal/tactile cues of chair placement in relation to bed prior to transfer completion due to Pt's low vision     Balance Overall balance assessment: Needs assistance Sitting-balance support: Single extremity supported;Feet supported;No upper extremity supported Sitting balance-Leahy Scale: Fair Sitting balance - Comments: static sitting EOB    Standing balance support: Bilateral upper extremity supported Standing balance-Leahy Scale: Poor Standing balance comment: reliant on UE support using RW and external support from therapist during mobility                            ADL either performed or assessed with clinical judgement   ADL Overall ADL's : Needs assistance/impaired Eating/Feeding: Sitting;Set up   Grooming: Minimal assistance;Set up;Sitting;Oral care Grooming Details (indicate cue type and reason): setup assist provided due to low vision  Upper Body Bathing: Min guard;Sitting   Lower Body Bathing: Moderate assistance;Sit to/from stand   Upper Body Dressing : Minimal assistance;Sitting Upper Body Dressing Details (indicate cue type and reason): doffing/donning new gown  Lower Body Dressing: Maximal assistance;Sit to/from stand   Toilet Transfer: Moderate assistance;Stand-pivot;BSC;RW Toilet Transfer Details (indicate cue type and reason): simulated in bed to chair transfer  Toileting- Clothing Manipulation and Hygiene: Maximal assistance;Sit to/from stand  Functional mobility during ADLs: Moderate assistance;Maximal assistance;Rolling walker General ADL  Comments: Pt requires multimodal cues during ADL and mobility completion due to low vision     Vision Baseline Vision/History: Glaucoma;Legally blind Patient Visual Report: No change from baseline Additional Comments: Pt's vision impaired at baseline; Pt repoorts he is able to complete tasks at home based on familiarity with environment (able to make out basic shapes/objects); multimodal cues provided during this session for task and mobility completion due to low vision                 Pertinent Vitals/Pain Pain Assessment: No/denies pain     Hand Dominance Right   Extremity/Trunk Assessment Upper Extremity Assessment Upper Extremity Assessment: Generalized weakness   Lower Extremity Assessment Lower Extremity Assessment: Defer to PT evaluation       Communication Communication Communication: No difficulties   Cognition Arousal/Alertness: Awake/alert Behavior During Therapy: WFL for tasks assessed/performed Overall Cognitive Status: Within Functional Limits for tasks assessed                                     General Comments  Pt's brother arriving and present end of session                Home Living Family/patient expects to be discharged to:: Private residence Living Arrangements: Spouse/significant other Available Help at Discharge: Family;Available 24 hours/day Type of Home: House Home Access: Stairs to enter Entergy CorporationEntrance Stairs-Number of Steps: 2   Home Layout: One level     Bathroom Shower/Tub: Producer, television/film/videoWalk-in shower   Bathroom Toilet: Standard     Home Equipment: Environmental consultantWalker - 2 wheels;Bedside commode;Grab bars - tub/shower;Cane - single point;Wheelchair - Manufacturing systems engineermanual;Shower seat - built in   Additional Comments: per chart review, dtr reports pt with general decline over the last few mos, pt not moving around as much  Lives With: Spouse    Prior Functioning/Environment Level of Independence: Independent;Independent with assistive device(s)         Comments: amb with cane; spouse assists with donning socks but was otherwise completing ADLs without assist         OT Problem List: Decreased strength;Impaired balance (sitting and/or standing);Decreased range of motion;Decreased activity tolerance;Impaired vision/perception      OT Treatment/Interventions: Self-care/ADL training;DME and/or AE instruction;Therapeutic activities;Balance training;Therapeutic exercise;Energy conservation;Patient/family education    OT Goals(Current goals can be found in the care plan section) Acute Rehab OT Goals Patient Stated Goal: return to walking and prior level of function OT Goal Formulation: With patient Time For Goal Achievement: 05/12/17 Potential to Achieve Goals: Good  OT Frequency: Min 2X/week               Co-evaluation              AM-PAC PT "6 Clicks" Daily Activity     Outcome Measure Help from another person eating meals?: A Little Help from another person taking care of personal grooming?: A Little Help from another person toileting, which includes using toliet, bedpan, or urinal?: A Lot Help from another person bathing (including washing, rinsing, drying)?: A Lot Help from another person to put on and taking off regular upper body clothing?: A Little Help from another person to put on and taking off regular lower body clothing?: A Lot 6 Click Score: 15   End of Session Equipment Utilized During Treatment: Gait belt;Rolling walker Nurse Communication: Mobility status  Activity Tolerance:  Patient tolerated treatment well Patient left: in chair;with call bell/phone within reach;with chair alarm set  OT Visit Diagnosis: Unsteadiness on feet (R26.81);Other abnormalities of gait and mobility (R26.89);Muscle weakness (generalized) (M62.81);Other symptoms and signs involving the nervous system (R29.898)                Time: 5732-2025 OT Time Calculation (min): 38 min Charges:  OT General Charges $OT Visit: 1 Visit OT  Evaluation $OT Eval Moderate Complexity: 1 Mod OT Treatments $Self Care/Home Management : 8-22 mins G-Codes:     Marcy Siren, OT Pager [phone] 04/28/2017   Orlando Penner 04/28/2017, 2:52 PM

## 2017-04-28 NOTE — Progress Notes (Signed)
STROKE TEAM PROGRESS NOTE   HISTORY OF PRESENT ILLNESS (per record) Johnny [name] is an 83 y.o. male with a history of HTN, HLD, DM2,CVA, macular degeneration and glaucoma who presented on 10/15 with vertigo. He denied hearing loss or tinnitus.   MRI obtained on 04/26/17 revealed scattered punctate foci of acute ischemia within both cerebral hemispheres and the right cerebellum. The largest focus was in the medial right cerebellum, measuring 10 mm. The distribution was most consistent with a central cardiac or aortic embolic source. Also seen was an old right cerebellar infarct and findings of chronic microvascular ischemia.  Carotid ultrasound obtained on 04/27/17 reveals bilateral 1% TO 39% ICA stenoses. Vertebral artery flow is antegrade.  Echocardiogram obtained on 04/27/17 revealed normal LV function; mild diastolic dysfunction with elevated LV filling pressure; moderate LVH; s/p AVR with mean gradient of 15 mmHg and no AI.   SUBJECTIVE (INTERVAL HISTORY) He is resting comfortably in bed. His vertigo is not problematic at rest. Participated with some therapy but has not been out of bed to walk today. He usually lives at home with his wife and walks with the aid of a cane.   OBJECTIVE CBC:   Recent Labs Lab 04/25/17 0050 04/26/17 0623  WBC 15.9* 10.3  NEUTROABS 12.7*  --   HGB 18.1* 15.3  HCT 50.1 44.5  MCV 83.9 84.6  PLT 200 178    Basic Metabolic Panel:   Recent Labs Lab 04/25/17 0050 04/26/17 0623  NA 136 138  K 4.1 3.9  CL 101 104  CO2 21* 25  GLUCOSE 254* 110*  BUN 25* 25*  CREATININE 2.00* 2.25*  CALCIUM 9.9 9.4    Lipid Panel: No results found for: CHOL, TRIG, HDL, CHOLHDL, VLDL, LDLCALC HgbA1c:  Lab Results  Component Value Date   HGBA1C 7.0 (H) 04/25/2017   Urine Drug Screen: No results found for: LABOPIA, COCAINSCRNUR, LABBENZ, AMPHETMU, THCU, LABBARB  Alcohol Level No results found for: [employer] LLCETH  IMAGING  Mr Brain Wo Contrast 04/26/2017 1.  Scattered punctate foci of acute ischemia within both cerebral hemispheres and the right cerebellum. The largest focus is in the medial right cerebellum measures 10 mm. The distribution is most consistent with a central cardiac or aortic embolic source. No hemorrhage or mass effect. 2. Old right cerebellar infarct and findings of chronic microvascular ischemia. 3. Normal MRI of the internal auditory canals and inner ear structures.  CT Head Wo Contrast 04/25/2017 1. No acute intracranial abnormality identified. 2. Stable moderate brain parenchymal volume loss and mild chronic microvascular ischemic changes of the brain. 3. Interval small chronic infarction within the right superior cerebellar hemisphere.  2D Echocardiogram 04/27/2017 LVEF 55-60%. Normal LV function; mild diastolic dysfunction with elevated LV filling pressure; moderate LVH; s/p AVR with mean gradient of 15 mmHg and no AI. Mildly calcified MV anulus.  Carotid Doppler Ultrasound 04/27/2017 - Extremely technically difficult due to bifurcation at the jaw line. - Bilateral - 1% TO 39% ICA stenosis. Vertebral artery flow is antegrade.   PHYSICAL EXAM Temp:  [97.9 F (36.6 C)-98.7 F (37.1 C)] 98.7 F (37.1 C) (10/18 0954) Pulse Rate:  [61-73] 73 (10/18 0954) Resp:  [16-20] 20 (10/18 0954) BP: (142-158)/(57-76) 142/61 (10/18 0954) SpO2:  [93 %-99 %] 97 % (10/18 0954) Weight:  [219 lb 6.4 oz (99.5 kg)] 219 lb 6.4 oz (99.5 kg) (10/18 0416)  General - Well nourished, well developed, in no apparent distress.  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  Cardiovascular -  Regular rate and rhythm.  Mental Status -  Level of arousal and orientation to time, place, and person were intact. Language including expression, naming, repetition, comprehension was assessed and found intact. Attention span and concentration were normal.  Cranial Nerves II - XII - II - Legally blind at baseline III, IV, VI -  Extraocular movements intact. V - Facial sensation intact bilaterally. VII - Facial movement intact bilaterally. VIII - Hearing & vestibular intact bilaterally. X - Palate elevates symmetrically. XI - Chin turning & shoulder shrug intact bilaterally. XII - Tongue protrusion intact.  Motor Strength - The patient's strength was normal in all extremities and pronator drift was absent.  Bulk was normal and fasciculations were absent.   Motor Tone - Muscle tone was assessed at the neck and appendages and was normal.  Reflexes - The patient's reflexes were 1+ in all extremities and he had no pathological reflexes.  Sensory - Light touch was assessed and was symmetrical.    Coordination - The patient had normal movements in the hands and feet with no ataxia or dysmetria.  Tremor was absent.  Gait and Station - Not assessed   ASSESSMENT/PLAN [name] is a 83 y.o. male with history of hypertension, hyperlipidemia, diabetes mellitus, CVA, macular degeneration presenting with vertigo.   Stroke: Multiple tiny deep seated white matter infarcts in bilateral hemispheres and largest in right cerebellum likely small vessel disease  Resultant  No vertigo at rest, awaiting gait functional assessment  CT head no acute abnormality  MRI head Scattered punctate infarcts in cerebral hemispheres and larger 10mm focus in R cerebellum around old R cerebellum infarct.  MRA head Pending  Carotid Doppler  No significant stenosis  2D Echo  LVEF 55-60%. No embolic source suggested.  LDL pending  HgbA1c 7.0%  Enoxaparin for VTE prophylaxis Diet Carb Modified Fluid consistency: Thin; Room service appropriate? Yes  aspirin 81 mg daily prior to admission, now on aspirin 325 mg daily  Patient counseled to be compliant with his antithrombotic medications  Ongoing aggressive stroke risk factor management  Therapy recommendations:  Pending  Disposition:   Pending  Hypertension  Stable Permissive hypertension (OK if < 220/120) but gradually normalize in 5-7 days Long-term BP goal normotensive <130/90  Hyperlipidemia  Home meds:  none  LDL pending, goal < 70  Add moderate or high intensity statin if LDL above goal  Continue statin at discharge  Diabetes  HgbA1c 7.0%, goal < 7.0  Controlled  Other Stroke Risk Factors  Advanced age  Obesity, Body mass index is 33.36 kg/m., recommend weight loss, diet and exercise as appropriate   Hx stroke/TIA  Coronary artery disease  Lesions could be secondary to intrinsic arterial disease and multifocal infarcts. His legal blindness likely to worsen fall risk if there is concurrent ataxia. He may not be able to return directly home where he was ambulatory with a cane. This would also make him a high risk to consider starting full dose anticoagulation even if there is concern for embolic infarcts hence will not consider TEE and loop recorder.  Hospital day # 1  I have personally examined this patient, reviewed notes, independently viewed imaging studies, participated in medical decision making and plan of care.ROS completed by me personally and pertinent positives fully documented  I have made any additions or clarifications directly to the above note.  He presented with dizziness and MRI shows scattered tiny punctate white matter infarcts likely from small vessel disease. Small emboli from cardiac  source are possible but patient is legally blind at baseline and fall risk and hence is not a good long-term anticoagulation candidate hence we'll not pursue TEE and loop recorder.Increase aspirin dose to 325 mg daily and continue ongoing stroke workup. No family available at bedside for discussion. Discussed with Dr. Benjamine Mola.I spent  35  minutes in total face-to-face time with the patient, more than 50% of which was spent in counseling and coordination of care, reviewing test results, reviewing medication  and discussing or reviewing the diagnosis of lacunar infarcts  and the prognosis and treatment options.   Delia Heady, MD Medical Director [facility] Pager: [phone] 04/28/2017 3:48 PM   To contact Stroke Continuity provider, please refer to [email or web address]. After hours, contact General Neurology

## 2017-04-29 DIAGNOSIS — E785 Hyperlipidemia, unspecified: Secondary | ICD-10-CM

## 2017-04-29 LAB — LIPID PANEL
Cholesterol: 177 mg/dL (ref 0–200)
HDL: 30 mg/dL — ABNORMAL LOW (ref >40)
LDL Cholesterol: 113 mg/dL — ABNORMAL HIGH (ref 0–99)
Total CHOL/HDL Ratio: 5.9 RATIO
Triglycerides: 171 mg/dL — ABNORMAL HIGH (ref <150)
VLDL: 34 mg/dL (ref 0–40)

## 2017-04-29 LAB — GLUCOSE, CAPILLARY
GLUCOSE-CAPILLARY: 175 mg/dL — AB (ref 65–99)
Glucose-Capillary: 121 mg/dL — ABNORMAL HIGH (ref 65–99)
Glucose-Capillary: 188 mg/dL — ABNORMAL HIGH (ref 65–99)
Glucose-Capillary: 205 mg/dL — ABNORMAL HIGH (ref 65–99)

## 2017-04-29 MED ORDER — ATORVASTATIN CALCIUM 10 MG PO TABS: 20.0000 mg | ORAL_TABLET | Freq: Every day | ORAL | Status: DC

## 2017-04-29 MED ORDER — ATORVASTATIN CALCIUM 10 MG PO TABS
20.0000 mg | ORAL_TABLET | Freq: Every day | ORAL | Status: DC
  Administered 2017-04-29 – 2017-05-06 (×8): 20 mg via ORAL
  Filled 2017-04-29 (×8): qty 2

## 2017-04-29 MED ORDER — ATORVASTATIN CALCIUM 40 MG PO TABS: 40.0000 mg | ORAL_TABLET | Freq: Every day | ORAL | Status: DC

## 2017-04-29 NOTE — Progress Notes (Signed)
Physical Therapy Treatment Patient Details Name: Johnny [name] MRN: [id number] DOB: [birth year] Today's Date: 04/29/2017    History of Present Illness Johnny [name]  is a 83 y.o. male, w hypertension, hyperlipidemia, Dm2,  CVA, macular degeneration, glaucoma apparently c/o vertigo; CT head neg, MRI obtained on 04/26/17 revealed scattered punctate foci of acute ischemia within both cerebral hemispheres and the right cerebellum; largest focus was in the medial right cerebellum, Also seen was an old right cerebellar infarct and findings of chronic microvascular ischemia; Pt transferred from [place] to [place] on 10/17    PT Comments    Patient is progressing toward mobility goals. Treatment focus today on pre-gait and gait activities. Patient demonstrates instability and still requiring increased assistance and cueing with aspects of mobility. At this time SNF remains appropriate recommendation to address impairments and maximize safe functional return to home. PT will continue to follow acutely and progress as tolerated.   Follow Up Recommendations  SNF (vs HHPT depending on progress)     Equipment Recommendations  None recommended by PT    Recommendations for Other Services       Precautions / Restrictions Precautions Precautions: Fall Precaution Comments: legally blind Restrictions Weight Bearing Restrictions: No    Mobility  Bed Mobility               General bed mobility comments: pt OOB in recliner upon arrival  Transfers Overall transfer level: Needs assistance Equipment used: Rolling walker (2 wheeled) Transfers: Sit to/from Stand Sit to Stand: Min assist         General transfer comment: Pt needed min VCs to scoot forward and for proper UE placement to RW. Needed min assist occasionally for power up and he also relied on LE support against recliner for stability in standing.   Ambulation/Gait Ambulation/Gait assistance: Min assist;Mod  assist Ambulation Distance (Feet): 60 Feet Assistive device: Rolling walker (2 wheeled) Gait Pattern/deviations: Step-through pattern;Step-to pattern;Decreased stride length;Drifts right/left;Trunk flexed;Scissoring Gait velocity: decreased Gait velocity interpretation: <1.8 ft/sec, indicative of risk for recurrent falls General Gait Details: slow and unsteady gait; scissoring demonstrated occasionally; needing VCs for direction d/t vision deficits. Min assist throughout with mod assist (wrap around with gait belt) occasionally to correct for LOB. Pt reported that he feels like he was leaning hard to the right often throughout ambulation   Stairs            Wheelchair Mobility    Modified Rankin (Stroke Patients Only) Modified Rankin (Stroke Patients Only) Pre-Morbid Rankin Score: Slight disability Modified Rankin: Moderately severe disability     Balance Overall balance assessment: Needs assistance Sitting-balance support: Feet supported;No upper extremity supported Sitting balance-Leahy Scale: Fair     Standing balance support: Bilateral upper extremity supported;During functional activity Standing balance-Leahy Scale: Poor Standing balance comment: patient reliant on UE support from RW at all times                            Cognition Arousal/Alertness: Awake/alert Behavior During Therapy: WFL for tasks assessed/performed Overall Cognitive Status: Within Functional Limits for tasks assessed                                        Exercises Other Exercises Other Exercises: sit to stands x3 from recliner    General Comments        Pertinent  Vitals/Pain Pain Assessment: No/denies pain    Home Living                      Prior Function            PT Goals (current goals can now be found in the care plan section) Acute Rehab PT Goals Patient Stated Goal: return to walking and prior level of function PT Goal Formulation:  With patient/family Time For Goal Achievement: 05/02/17 Potential to Achieve Goals: Good Progress towards PT goals: Progressing toward goals    Frequency    Min 3X/week      PT Plan Current plan remains appropriate    Co-evaluation              AM-PAC PT "6 Clicks" Daily Activity  Outcome Measure  Difficulty turning over in bed (including adjusting bedclothes, sheets and blankets)?: Unable Difficulty moving from lying on back to sitting on the side of the bed? : Unable Difficulty sitting down on and standing up from a chair with arms (e.g., wheelchair, bedside commode, etc,.)?: Unable Help needed moving to and from a bed to chair (including a wheelchair)?: A Lot Help needed walking in hospital room?: A Lot Help needed climbing 3-5 steps with a railing? : Total 6 Click Score: 8    End of Session Equipment Utilized During Treatment: Gait belt Activity Tolerance: Patient tolerated treatment well Patient left: in chair;with chair alarm set;with call bell/phone within reach Nurse Communication: Mobility status PT Visit Diagnosis: Other abnormalities of gait and mobility (R26.89)     Time: 1410-1430 PT Time Calculation (min) (ACUTE ONLY): 20 min  Charges:  $Gait Training: 8-22 mins                    G CodesMckinley Jewel:       A. Jamila Lativia Velie, SPT [phone] office    Fonnie BirkenheadAndria J  Kensly Bowmer 04/29/2017, 3:23 PM

## 2017-04-29 NOTE — Progress Notes (Signed)
Paged Lynch regarding patient's temperature. Gave tylenol. Will continue to monitor.

## 2017-04-29 NOTE — Progress Notes (Signed)
STROKE TEAM PROGRESS NOTE   HISTORY OF PRESENT ILLNESS (per record) Johnny [name] is an 83 y.o. male with a history of HTN, HLD, DM2,CVA, macular degeneration and glaucoma who presented on 10/15 with vertigo. He denied hearing loss or tinnitus.   MRI obtained on 04/26/17 revealed scattered punctate foci of acute ischemia within both cerebral hemispheres and the right cerebellum. The largest focus was in the medial right cerebellum, measuring 10 mm. The distribution was most consistent with a central cardiac or aortic embolic source. Also seen was an old right cerebellar infarct and findings of chronic microvascular ischemia.  Carotid ultrasound obtained on 04/27/17 reveals bilateral 1% TO 39% ICA stenoses. Vertebral artery flow is antegrade.  Echocardiogram obtained on 04/27/17 revealed normal LV function; mild diastolic dysfunction with elevated LV filling pressure; moderate LVH; s/p AVR with mean gradient of 15 mmHg and no AI.   SUBJECTIVE (INTERVAL HISTORY) He is resting comfortably in bed. His vertigo is not problematic at rest. Participated with some therapy but has not been out of bed to walk today. He usually lives at home with his wife and walks with the aid of a cane.   OBJECTIVE   CBC:   Recent Labs Lab 04/25/17 0050 04/26/17 0623  WBC 15.9* 10.3  NEUTROABS 12.7*  --   HGB 18.1* 15.3  HCT 50.1 44.5  MCV 83.9 84.6  PLT 200 178    Basic Metabolic Panel:   Recent Labs Lab 04/25/17 0050 04/26/17 0623  NA 136 138  K 4.1 3.9  CL 101 104  CO2 21* 25  GLUCOSE 254* 110*  BUN 25* 25*  CREATININE 2.00* 2.25*  CALCIUM 9.9 9.4    Lipid Panel:     Component Value Date/Time   CHOL 177 04/29/2017 0517   TRIG 171 (H) 04/29/2017 0517   HDL 30 (L) 04/29/2017 0517   CHOLHDL 5.9 04/29/2017 0517   VLDL 34 04/29/2017 0517   LDLCALC 113 (H) 04/29/2017 0517   HgbA1c:  Lab Results  Component Value Date   HGBA1C 7.0 (H) 04/25/2017   Urine Drug Screen: No results  found for: LABOPIA, COCAINSCRNUR, LABBENZ, AMPHETMU, THCU, LABBARB  Alcohol Level No results found for: [facility]  [facility]AGING  MRA Head WO Contrast 04/28/2017 IMPRESSION: 1. Nonvisualized RIGHT posterior- inferior cerebellar artery seen with artifact, slow flow or occlusion.   CT angiogram of the head and neck may be more definitive. (creatinine - 2.25) 2. Moderate stenoses of the anterior and posterior circulation compatible with atherosclerosis. 3. 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    Mr Brain Wo Contrast 04/26/2017 1. Scattered punctate foci of acute ischemia within both cerebral hemispheres and the right cerebellum. The largest focus is in the medial right cerebellum measures 10 mm. The distribution is most consistent with a central cardiac or aortic embolic source. No hemorrhage or mass effect. 2. Old right cerebellar infarct and findings of chronic microvascular ischemia. 3. Normal MRI of the internal auditory canals and inner ear structures.  CT Head Wo Contrast 04/25/2017 1. No acute intracranial abnormality identified. 2. Stable moderate brain parenchymal volume loss and mild chronic microvascular ischemic changes of the brain. 3. Interval small chronic infarction within the right superior cerebellar hemisphere.  2D Echocardiogram 04/27/2017 LVEF 55-60%. Normal LV function; mild diastolic dysfunction with elevated LV filling pressure; moderate LVH; s/p AVR with mean gradient of 15 mmHg and no AI. Mildly calcified MV anulus.  Carotid Doppler Ultrasound 04/27/2017 - Extremely technically difficult due to bifurcation at the jaw  line. - Bilateral - 1% TO 39% ICA stenosis. Vertebral artery flow is antegrade.   PHYSICAL EXAM Temp:  [98 F (36.7 C)-98.7 F (37.1 C)] 98 F (36.7 C) (10/19 0455) Pulse Rate:  [58-73] 58 (10/19 0455) Resp:  [16-20] 18 (10/19 0455) BP: (134-144)/(58-63) 144/63 (10/19 0455) SpO2:  [94 %-97 %] 95 % (10/19 0455) Weight:  [221 lb 8 oz (100.5 kg)] 221  lb 8 oz (100.5 kg) (10/19 0455)   Vitals:   04/28/17 1650 04/28/17 2055 04/29/17 0047 04/29/17 0455  BP: 134/60 (!) 134/58 (!) 143/63 (!) 144/63  Pulse: (!) 58 61 60 (!) 58  Resp: 16 18 18 18   Temp: 98 F (36.7 C) 98.3 F (36.8 C) 98.6 F (37 C) 98 F (36.7 C)  TempSrc: Oral Oral Oral Oral  SpO2: 96% 95% 94% 95%  Weight:    221 lb 8 oz (100.5 kg)  Height:         General - Well nourished, well developed, in no apparent distress.  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  Cardiovascular - Regular rate and rhythm.  Mental Status -  Level of arousal and orientation to time, place, and person were intact. Language including expression, naming, repetition, comprehension was assessed and found intact. Attention span and concentration were normal.  Cranial Nerves II - XII - II - Legally blind at baseline III, IV, VI - Extraocular movements intact. V - Facial sensation intact bilaterally. VII - Facial movement intact bilaterally. VIII - Hearing & vestibular intact bilaterally. X - Palate elevates symmetrically. XI - Chin turning & shoulder shrug intact bilaterally. XII - Tongue protrusion intact.  Motor Strength - The patient's strength was normal in all extremities and pronator drift was absent.  Bulk was normal and fasciculations were absent.   Motor Tone - Muscle tone was assessed at the neck and appendages and was normal.  Reflexes - The patient's reflexes were 1+ in all extremities and he had no pathological reflexes.  Sensory - Light touch was assessed and was symmetrical.    Coordination - The patient had normal movements in the hands and feet with no ataxia or dysmetria.  Tremor was absent.  Gait and Station - Not assessed   ASSESSMENT/PLAN [name] is a 83 y.o. male with history of hypertension, hyperlipidemia, diabetes mellitus, CVA, macular degeneration presenting with vertigo.   Stroke: Multiple tiny deep seated white  matter infarcts in bilateral hemispheres and largest in right cerebellum likely small vessel disease  Resultant  No vertigo at rest, awaiting gait functional assessment  CT head no acute abnormality  MRI head Scattered punctate infarcts in cerebral hemispheres and larger 10mm focus in R cerebellum around old R cerebellum infarct.  MRA head - Nonvisualized RIGHT posterior- inferior cerebellar artery seen with artifact, slow flow or occlusion.   Moderate stenoses of the anterior and posterior circulation compatible with atherosclerosis.  Carotid Doppler  No significant stenosis  2D Echo  LVEF 55-60%. No embolic source suggested.  LDL 113  HgbA1c 7.0%  Enoxaparin for VTE prophylaxis Diet Carb Modified Fluid consistency: Thin; Room service appropriate? Yes  aspirin 81 mg daily prior to admission, now on aspirin 325 mg daily  Patient counseled to be compliant with his antithrombotic medications  Ongoing aggressive stroke risk factor management  Therapy recommendations:  SNF recommended    Disposition:  Pending  Hypertension  Stable Permissive hypertension (OK if < 220/120) but gradually normalize in 5-7 days Long-term BP goal normotensive <130/90  Hyperlipidemia  Home meds:  none  LDL 113, goal < 70  On Lipitor 20 mg daily -> increase to 40 mg daily  Continue statin at discharge  Diabetes  HgbA1c 7.0%, goal < 7.0  Controlled  Other Stroke Risk Factors  Advanced age  Obesity, Body mass index is 33.68 kg/m., recommend weight loss, diet and exercise as appropriate   Hx stroke/TIA  Coronary artery disease  Other Problems  CKD - 25 / 2.25   PLAN  TEE and possible loop Monday (pt denies ever falling and would be in favor of anticoagulation if indicated)  Lesions could be secondary to intrinsic arterial disease and multifocal infarcts. His legal blindness likely to worsen fall risk if there is concurrent ataxia. He may not be able to return directly home  where he was ambulatory with a cane. This would also make him a high risk to consider starting full dose anticoagulation even if there is concern for embolic infarcts hence will not consider TEE and loop recorder.  Hospital day # 2  I have personally examined this patient, reviewed notes, independently viewed imaging studies, participated in medical decision making and plan of care.ROS completed by me personally and pertinent positives fully documented  I have made any additions or clarifications directly to the above note.  He presented with dizziness and MRI shows scattered tiny punctate white matter infarcts likely from small vessel disease. Small emboli from cardiac source are possible but patient is legally blind at baseline and fall risk and hence is not a good long-term anticoagulation candidate hence we'll not pursue TEE and loop recorder.Increase aspirin dose to 325 mg daily and continue ongoing stroke workup. No family available at bedside for discussion. Discussed with Dr. Benjamine Mola.I spent  35  minutes in total face-to-face time with the patient, more than 50% of which was spent in counseling and coordination of care, reviewing test results, reviewing medication and discussing or reviewing the diagnosis of lacunar infarcts  and the prognosis and treatment options.     To contact Stroke Continuity provider, please refer to [email or web address]. After hours, contact General Neurology

## 2017-04-29 NOTE — NC FL2 (Signed)
[place] MEDICAID FL2 LEVEL OF CARE SCREENING TOOL     IDENTIFICATION  Patient Name: Johnny [name] Birthdate: [birth year] Sex: male Admission Date (Current Location): 04/24/2017  [facility] [place]   Facility and Address:  [facility], [address], [place], [place] [address]      Provider Number: [id number]  Attending Physician Name and Address:  Edsel PetrinMikhail, Maryann, DO  Relative Name and Phone Number:       Current Level of Care: Hospital Recommended Level of Care: Skilled Nursing Facility Prior Approval Number:    Date Approved/Denied:   PASRR Number: [id number]  Discharge Plan: SNF    Current Diagnoses: Patient Active Problem List   Diagnosis Date Noted  . Acute ischemic stroke (HCC) 04/27/2017  . Stroke (cerebrum) (HCC) 04/27/2017  . Vertigo 04/25/2017  . CKD (chronic kidney disease) stage 3, GFR 30-59 ml/min (HCC) 04/25/2017  . Bunion 07/26/2014  . Acute encephalopathy 01/03/2014  . Aortic valve replaced 01/03/2014  . CAD in native artery 01/03/2014  . Fever 01/02/2014  . Pain in lower limb 10/26/2013  . Onychomycosis 11/17/2012  . Pain in joint, ankle and foot 11/17/2012  . Aortic stenosis 02/25/2012  . Abnormal ECG 02/25/2012  . HTN (hypertension)   . Paresthesia   . Diabetes mellitus (HCC)   . Macular degeneration   . CVA (cerebral infarction)   . Ptosis   . Gait disturbance     Orientation RESPIRATION BLADDER Height & Weight     Self, Time, Situation, Place  Normal Incontinent, External catheter Weight: 221 lb 8 oz (100.5 kg) Height:  5\' 8"  (172.7 cm)  BEHAVIORAL SYMPTOMS/MOOD NEUROLOGICAL BOWEL NUTRITION STATUS      Continent Diet (carb modified)  AMBULATORY STATUS COMMUNICATION OF NEEDS Skin   Limited Assist Verbally Normal                       Personal Care Assistance Level of Assistance  Bathing, Dressing Bathing Assistance: Limited assistance   Dressing Assistance: Limited  assistance     Functional Limitations Info             SPECIAL CARE FACTORS FREQUENCY  PT (By licensed PT), OT (By licensed OT)     PT Frequency: 5/wk OT Frequency: 5/wk            Contractures      Additional Factors Info  Code Status, Allergies, Insulin Sliding Scale Code Status Info: FULL Allergies Info: Glyxambi Empagliflozin-linagliptin, Heparin   Insulin Sliding Scale Info: 4/wk       Current Medications (04/29/2017):  This is the current hospital active medication list Current Facility-Administered Medications  Medication Dose Route Frequency Provider Last Rate Last Dose  . 0.9 %  sodium chloride infusion  250 mL Intravenous PRN Pearson GrippeKim, James, MD      . acetaminophen (TYLENOL) tablet 650 mg  650 mg Oral Q6H PRN Pearson GrippeKim, James, MD       Or  . acetaminophen (TYLENOL) suppository 650 mg  650 mg Rectal Q6H PRN Pearson GrippeKim, James, MD      . allopurinol (ZYLOPRIM) tablet 200 mg  200 mg Oral Farrel DemarkQHS Kim, James, MD   200 mg at 04/28/17 2201  . amLODipine (NORVASC) tablet 10 mg  10 mg Oral Daily Pearson GrippeKim, James, MD   10 mg at 04/29/17 1007  . aspirin suppository 300 mg  300 mg Rectal Daily Osvaldo ShipperKrishnan, Gokul, MD       Or  .  aspirin tablet 325 mg  325 mg Oral Daily Osvaldo Shipper, MD   325 mg at 04/29/17 1006  . atorvastatin (LIPITOR) tablet 20 mg  20 mg Oral q1800 Mikhail, Nita Sells, DO      . docusate sodium (COLACE) capsule 100 mg  100 mg Oral BID Pearson Grippe, MD   100 mg at 04/29/17 1006  . ferrous sulfate tablet 325 mg  325 mg Oral BID Penny Pia, MD   325 mg at 04/29/17 1007  . folic acid (FOLVITE) tablet 1 mg  1 mg Oral Daily Pearson Grippe, MD   1 mg at 04/29/17 1007  . furosemide (LASIX) tablet 40 mg  40 mg Oral Daily Penny Pia, MD   40 mg at 04/29/17 1007  . hydrALAZINE (APRESOLINE) injection 10 mg  10 mg Intravenous Q6H PRN Pearson Grippe, MD   10 mg at 04/27/17 0557  . hydrALAZINE (APRESOLINE) tablet 50 mg  50 mg Oral TID Pearson Grippe, MD   50 mg at 04/29/17 1007  . insulin aspart  (novoLOG) injection 0-5 Units  0-5 Units Subcutaneous QHS Pearson Grippe, MD   2 Units at 04/28/17 2303  . insulin aspart (novoLOG) injection 0-9 Units  0-9 Units Subcutaneous TID WC Pearson Grippe, MD   1 Units at 04/29/17 [phone]  . insulin glargine (LANTUS) injection 30 Units  30 Units Subcutaneous BID Penny Pia, MD   30 Units at 04/29/17 1007  . latanoprost (XALATAN) 0.005 % ophthalmic solution 1 drop  1 drop Both Eyes QHS Penny Pia, MD   1 drop at 04/28/17 2204  . metoprolol tartrate (LOPRESSOR) tablet 50 mg  50 mg Oral BID Pearson Grippe, MD   50 mg at 04/29/17 1007  . pantoprazole (PROTONIX) EC tablet 40 mg  40 mg Oral Daily Pearson Grippe, MD   40 mg at 04/29/17 1007  . senna (SENOKOT) tablet 8.6 mg  1 tablet Oral QHS Pearson Grippe, MD   8.6 mg at 04/28/17 2201  . sodium chloride flush (NS) 0.9 % injection 3 mL  3 mL Intravenous Q12H Pearson Grippe, MD   3 mL at 04/29/17 1008  . sodium chloride flush (NS) 0.9 % injection 3 mL  3 mL Intravenous PRN Pearson Grippe, MD         Discharge Medications: Please see discharge summary for a list of discharge medications.  Relevant Imaging Results:  Relevant Lab Results:   Additional Information SS#: [id number]  Burna Sis, LCSW

## 2017-04-29 NOTE — Consult Note (Signed)
[facility] CM Primary Care Navigator  04/29/2017  CHIBUIKEM THANG [birth year] [id number]   Met with patient at the bedside to identify possible discharge needs. Patient reports that his "legs gave way", had felt weakness, dizziness and vomiting which resulted tothis admission.  Patient endorses Dr. Anda Kraft (retired) or whoever took his place with [employer] as theprimary care provider.   Patient sharedusing [facility] on [address] obtain medications without difficulty.   Patient states that wife Romie Minus) has been managing his medications at home using "pill box" system filled every week. Patient reports he is legally blind.  Wife has been providingtransportationto hisdoctors'appointments.  Patient's wife is theprimary caregiver at home as stated.  Anticipated discharge plan is SNF (skilled nursing facility) per therapy recommendation prior to returning home per patient.  Patient voiced understanding to call primary care provider's officewhen he returns backhome,for a post discharge follow-up within a week or sooner if needs arise.Patient letter (with PCP's contact number) was provided as areminder.   Discussed with [facility]regarding [facility] CM services available for health managementat home. Patient states "managing self so far" without any issues. Patientexpressed understanding to seekreferral from primary care provider to [facility] care management ifdeemed necessary and appropriatefor services in the future- when he returns back home.  [facility] care management information was provided for future needs that patientmay have.   For additional questions please contact:  Edwena Felty A. Maisyn Nouri, BSN, RN-BC [facility] PRIMARY CARE Navigator Cell: [phone]

## 2017-04-29 NOTE — Progress Notes (Signed)
PROGRESS NOTE    Johnny [name]  WGN:[id number] DOB: [birth year] DOA: 04/24/2017 PCP: Johnny Needle, MD   Chief Complaint  Patient presents with  . N/V    Brief Narrative:  HPI on 04/25/2017 by Dr. Pearson Grippe Rasul [name]  is a 83 y.o. male, w hypertension, hyperlipidemia, Dm2,  CVA, macular degeneration, glaucoma apparently c/o vertigo ,  Denies hearing loss or tinnitus.  No family history.  No recent illness.   In ED. Wbc 15.9, Hgb 18.1, Plt 200 Bun 25, Creatinine 2.00 CT brain negative.  Pt will be admitted for new onset vertigo, n/v.    Assessment & Plan   Acute CVA -CT head showed no acute abnormality -MRI brain showed scattered punctate infarcts and cerebral hemispheres and larger 10 mm focus in right cerebellum around old right cerebellum infarct -MRA: Nonvisualized right posterior-inferior cerebellar artery seen with artifact, low flow or occlusion. Moderate stenosis of anterior posterior circulation compatible with atherosclerosis. 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 Bilateral 1-39% ICA stenosis. Vertebral artery flow is antegrade -Echocardiogram: EF 55-60%, grade 1 diastolic dysfunction, moderate LVH, status post aVR with mean gradient of 15 mmHg -Hemoglobin A1c 7, LDL 113 -Neurology consulted and appreciated -PT/OT recommended SNF -Continue aspirin 325 mg -Will start on atorvastatin today. Patient states he had been receiving injections up until one year ago for his cholesterol. -Discussed with Dr. Lucia Gaskins, neurology, feels patient should have TEE. Cardiology made aware  Essential hypertension -Allow for permissive hypertension given acute CVA  Diabetes mellitus, type II -Continue Lantus, insulin sliding scale, CBG monitoring -Hemoglobin A1c 7  Chronic kidney disease, stage III -Creatinine baseline, continue monitor BMP  DVT Prophylaxis  SCDs  Code Status: Full  Family  Communication: None at bedside  Disposition Plan: Admitted, pending TEE, and SNF  Consultants Neurology  Procedures  Echocardiogram Carotid doppler  Antibiotics   Anti-infectives    None      Subjective:   Johnny [name] seen and examined today.  Patient denies chest pain, shortness of breath, abdominal pain, nausea, vomiting, diarrhea, constipation, headache, dizziness.   Objective:   Vitals:   04/28/17 1650 04/28/17 2055 04/29/17 0047 04/29/17 0455  BP: 134/60 (!) 134/58 (!) 143/63 (!) 144/63  Pulse: (!) 58 61 60 (!) 58  Resp: 16 18 18 18   Temp: 98 F (36.7 C) 98.3 F (36.8 C) 98.6 F (37 C) 98 F (36.7 C)  TempSrc: Oral Oral Oral Oral  SpO2: 96% 95% 94% 95%  Weight:    100.5 kg (221 lb 8 oz)  Height:        Intake/Output Summary (Last 24 hours) at 04/29/17 1353 Last data filed at 04/29/17 1334  Gross per 24 hour  Intake              420 ml  Output             1850 ml  Net            -1430 ml   Filed Weights   04/27/17 0500 04/28/17 0416 04/29/17 0455  Weight: 101 kg (222 lb 11 oz) 99.5 kg (219 lb 6.4 oz) 100.5 kg (221 lb 8 oz)    Exam  General: Well developed, well nourished, NAD, appears stated age  HEENT: NCAT, mucous membranes moist.   Cardiovascular: S1 S2 auscultated, no rubs, murmurs or gallops. Regular rate and rhythm.  Respiratory: Clear to auscultation bilaterally with equal chest rise  Abdomen:  Soft, nontender, nondistended, + bowel sounds  Extremities: warm dry without cyanosis clubbing or edema  Neuro: AAOx3, nonfocal (legally blind)  Psych: Normal affect and demeanor, pleasant   Data Reviewed: I have personally reviewed following labs and imaging studies  CBC:  Recent Labs Lab 04/25/17 0050 04/26/17 0623  WBC 15.9* 10.3  NEUTROABS 12.7*  --   HGB 18.1* 15.3  HCT 50.1 44.5  MCV 83.9 84.6  PLT 200 178   Basic Metabolic Panel:  Recent Labs Lab 04/25/17 0050 04/26/17 0623  NA 136 138  K 4.1 3.9  CL 101 104  CO2  21* 25  GLUCOSE 254* 110*  BUN 25* 25*  CREATININE 2.00* 2.25*  CALCIUM 9.9 9.4   GFR: Estimated Creatinine Clearance: 29.1 mL/min (A) (by C-G formula based on SCr of 2.25 mg/dL (H)). Liver Function Tests:  Recent Labs Lab 04/25/17 0050 04/26/17 0623  AST 34 25  ALT 31 26  ALKPHOS 89 72  BILITOT 0.5 0.5  PROT 8.1 6.5  ALBUMIN 4.5 3.5   No results for input(s): LIPASE, AMYLASE in the last 168 hours. No results for input(s): AMMONIA in the last 168 hours. Coagulation Profile: No results for input(s): INR, PROTIME in the last 168 hours. Cardiac Enzymes: No results for input(s): CKTOTAL, CKMB, CKMBINDEX, TROPONINI in the last 168 hours. BNP (last 3 results) No results for input(s): PROBNP in the last 8760 hours. HbA1C: No results for input(s): HGBA1C in the last 72 hours. CBG:  Recent Labs Lab 04/28/17 1137 04/28/17 1629 04/28/17 2113 04/29/17 0618 04/29/17 1115  GLUCAP 152* 169* 208* 121* 175*   Lipid Profile:  Recent Labs  04/29/17 0517  CHOL 177  HDL 30*  LDLCALC 113*  TRIG 171*  CHOLHDL 5.9   Thyroid Function Tests: No results for input(s): TSH, T4TOTAL, FREET4, T3FREE, THYROIDAB in the last 72 hours. Anemia Panel: No results for input(s): VITAMINB12, FOLATE, FERRITIN, TIBC, IRON, RETICCTPCT in the last 72 hours. Urine analysis:    Component Value Date/Time   COLORURINE YELLOW 01/02/2014 2236   APPEARANCEUR CLEAR 01/02/2014 2236   LABSPEC 1.017 01/02/2014 2236   PHURINE 5.0 01/02/2014 2236   GLUCOSEU >1000 (A) 01/02/2014 2236   HGBUR NEGATIVE 01/02/2014 2236   BILIRUBINUR NEGATIVE 01/02/2014 2236   KETONESUR NEGATIVE 01/02/2014 2236   PROTEINUR 100 (A) 01/02/2014 2236   UROBILINOGEN 0.2 01/02/2014 2236   NITRITE NEGATIVE 01/02/2014 2236   LEUKOCYTESUR NEGATIVE 01/02/2014 2236   Sepsis Labs: @LABRCNTIP (procalcitonin:4,lacticidven:4)  )No results found for this or any previous visit (from the past 240 hour(s)).    Radiology Studies: Mr Shirlee LatchMra  Head ZOWo Contrast  Result Date: 04/28/2017 CLINICAL DATA:  Follow-up stroke. History of hypertension, hyperlipidemia, diabetes and stroke. EXAM: MRA HEAD WITHOUT CONTRAST TECHNIQUE: Angiographic images of the Circle of Willis were obtained using MRA technique without intravenous contrast. COMPARISON:  MRI of the head April 26, 2017 and MRA head October 07, 2011 FINDINGS: ANTERIOR CIRCULATION: Normal flow related enhancement of the included cervical, petrous, cavernous and supraclinoid internal carotid arteries. Patent anterior communicating artery. 2 mm inferomedially directed aneurysm LEFT A1-2 junction. Patent anterior and middle cerebral arteries, including distal segments. Moderate tandem stenoses anterior middle cerebral artery's. No large vessel occlusion, high-grade stenosis. POSTERIOR CIRCULATION: Codominant vertebral artery's. No RIGHT posterior-inferior cerebellar artery flow related enhancement. Basilar artery is patent, with normal flow related enhancement of the remaining main branch vessels. Patent posterior cerebral arteries. Moderate bilateral posterior cerebral artery tandem stenoses. No high-grade stenosis,  aneurysm. ANATOMIC VARIANTS: None.  Source images and MIP images were reviewed. IMPRESSION: 1. Nonvisualized RIGHT posterior- inferior cerebellar artery seen with artifact, slow flow or occlusion. CT angiogram of the head and neck may be more definitive. 2. Moderate stenoses of the anterior and posterior circulation compatible with atherosclerosis. 3. 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 Electronically Signed   By: Awilda Metro M.D.   On: 04/28/2017 15:35     Scheduled Meds: . allopurinol  200 mg Oral QHS  . amLODipine  10 mg Oral Daily  . aspirin  300 mg Rectal Daily   Or  . aspirin  325 mg Oral Daily  . atorvastatin  40 mg Oral q1800  . docusate sodium  100 mg Oral BID  . ferrous sulfate  325 mg Oral BID  . folic acid  1 mg Oral Daily  . furosemide  40 mg Oral Daily  .  hydrALAZINE  50 mg Oral TID  . insulin aspart  0-5 Units Subcutaneous QHS  . insulin aspart  0-9 Units Subcutaneous TID WC  . insulin glargine  30 Units Subcutaneous BID  . latanoprost  1 drop Both Eyes QHS  . metoprolol tartrate  50 mg Oral BID  . pantoprazole  40 mg Oral Daily  . senna  1 tablet Oral QHS  . sodium chloride flush  3 mL Intravenous Q12H   Continuous Infusions: . sodium chloride       LOS: 2 days   Time Spent in minutes   30 minutes  Darlean Warmoth D.O. on 04/29/2017 at 1:53 PM  Between 7am to 7pm - Pager - [phone]  After 7pm go to [email or web address] - password [email or web address]  And look for the night coverage person covering for me after hours  [facility] Office  [phone]

## 2017-04-29 NOTE — Progress Notes (Signed)
    [facility] has been requested to perform a transesophageal echocardiogram on Mr. Roseboom for Stroke.  After careful review of history and examination, the risks and benefits of transesophageal echocardiogram have been explained including risks of esophageal damage, perforation (1:10,000 risk), bleeding, pharyngeal hematoma as well as other potential complications associated with conscious sedation including aspiration, arrhythmia, respiratory failure and death. Alternatives to treatment were discussed, questions were answered. Patient is willing to proceed.  TEE - Dr. Delton SeeNelson 06/02/17 @ 1300. NPO after midnight. Meds with sips.   Chayah Mckee, PA-C 04/29/2017 5:03 PM

## 2017-04-29 NOTE — Care Management Note (Signed)
Case Management Note  Patient Details  Name: Johnny [name] MRN: [id number] Date of Birth: 1932-12-16  Subjective/Objective:    Pt admitted with CVA. He is legally blind and lives home with his wife.                 Action/Plan: PT/OT recommending SNF for rehab. CM met with the patient and he is agreeable to SNF. CM asked about location and he would like to be faxed out in the [facility] area. CSW updated. CM following.   Expected Discharge Date:  04/27/17               Expected Discharge Plan:  [place]  In-House Referral:     Discharge planning Services  CM Consult  Post Acute Care Choice:    Choice offered to:     DME Arranged:    DME Agency:     HH Arranged:    HH Agency:     Status of Service:  In process, will continue to follow  If discussed at Long Length of Stay Meetings, dates discussed:    Additional Comments:  Pollie Friar, RN 04/29/2017, 10:56 AM

## 2017-04-29 NOTE — Clinical Social Work Note (Signed)
Clinical Social Work Assessment  Patient Details  Name: Johnny [name] B Ragon MRN: [id number] Date of Birth: April 30, 1933  Date of referral:  04/29/17               Reason for consult:  Facility Placement                Permission sought to share information with:  Facility Medical sales representativeContact Representative, Family Supports Permission granted to share information::  Yes, Verbal Permission Granted  Name::     Risk managerJean  Agency::  SNF  Relationship::  Wife  Contact Information:     Housing/Transportation Living arrangements for the past 2 months:  Single Family Home Source of Information:  Patient Patient Interpreter Needed:  None Criminal Activity/Legal Involvement Pertinent to Current Situation/Hospitalization:  No - Comment as needed Significant Relationships:  Spouse Lives with:  Self, Spouse Do you feel safe going back to the place where you live?  Yes Need for family participation in patient care:  No (Coment)  Care giving concerns:  Patient lives at home with spouse, but will need short term rehab prior to returning home.   Social Worker assessment / plan:  CSW confirmed with patient that he is agreeable for SNF. CSW completed referral and faxed out. CSW provided list of bed offers. CSW to follow up to determine facility choice and facilitate discharge when medically ready.  Employment status:  Retired Database administratornsurance information:  Managed Medicare PT Recommendations:  Skilled Nursing Facility Information / Referral to community resources:     Patient/Family's Response to care:  Patient agreeable to SNF.  Patient/Family's Understanding of and Emotional Response to Diagnosis, Current Treatment, and Prognosis:  Patient is unsure of where he would like to go for SNF, and would like to discuss with his wife when she arrives at the hospital. Patient understanding of current diagnosis and treatment recommendations.  Emotional Assessment Appearance:  Appears stated age Attitude/Demeanor/Rapport:    Affect  (typically observed):  Appropriate Orientation:  Oriented to Self, Oriented to Place, Oriented to  Time, Oriented to Situation Alcohol / Substance use:  Not Applicable Psych involvement (Current and /or in the community):  No (Comment)  Discharge Needs  Concerns to be addressed:  Care Coordination Readmission within the last 30 days:  No Current discharge risk:  Physical Impairment Barriers to Discharge:  Continued Medical Work up   Dollar GeneralElizabeth M Ellwood Steidle, LCSW 04/29/2017, 2:49 PM

## 2017-04-30 ENCOUNTER — Inpatient Hospital Stay (HOSPITAL_COMMUNITY): Payer: Medicare Other

## 2017-04-30 DIAGNOSIS — I6340 Cerebral infarction due to embolism of unspecified cerebral artery: Principal | ICD-10-CM

## 2017-04-30 DIAGNOSIS — E785 Hyperlipidemia, unspecified: Secondary | ICD-10-CM

## 2017-04-30 DIAGNOSIS — H548 Legal blindness, as defined in USA: Secondary | ICD-10-CM

## 2017-04-30 DIAGNOSIS — R509 Fever, unspecified: Secondary | ICD-10-CM

## 2017-04-30 DIAGNOSIS — E1159 Type 2 diabetes mellitus with other circulatory complications: Secondary | ICD-10-CM

## 2017-04-30 DIAGNOSIS — I639 Cerebral infarction, unspecified: Secondary | ICD-10-CM

## 2017-04-30 LAB — GLUCOSE, CAPILLARY
GLUCOSE-CAPILLARY: 200 mg/dL — AB (ref 65–99)
GLUCOSE-CAPILLARY: 232 mg/dL — AB (ref 65–99)
GLUCOSE-CAPILLARY: 408 mg/dL — AB (ref 65–99)
Glucose-Capillary: 146 mg/dL — ABNORMAL HIGH (ref 65–99)
Glucose-Capillary: 265 mg/dL — ABNORMAL HIGH (ref 65–99)

## 2017-04-30 LAB — URINALYSIS, ROUTINE W REFLEX MICROSCOPIC
BILIRUBIN URINE: NEGATIVE
Glucose, UA: 50 mg/dL — AB
Ketones, ur: NEGATIVE mg/dL
Nitrite: NEGATIVE
Protein, ur: 100 mg/dL — AB
SPECIFIC GRAVITY, URINE: 1.016 (ref 1.005–1.030)
pH: 5 (ref 5.0–8.0)

## 2017-04-30 LAB — INFLUENZA PANEL BY PCR (TYPE A & B)
INFLBPCR: NEGATIVE
Influenza A By PCR: NEGATIVE

## 2017-04-30 NOTE — Progress Notes (Signed)
*  PRELIMINARY RESULTS* Vascular Ultrasound Lower ext venous duplex has been completed.  Preliminary findings: No evidence of DVT or baker's cyst.    Farrel DemarkJill Eunice, RDMS, RVT  04/30/2017, 3:13 PM

## 2017-04-30 NOTE — Progress Notes (Signed)
STROKE TEAM PROGRESS NOTE   SUBJECTIVE (INTERVAL HISTORY) He is resting comfortably in bed.  No family at bedside.  His vertigo is not problematic at rest but still present on sitting up or standing up as per patient.  Chronic vision loss bilaterally only to light perception.  Pending TEE and loop recorder on Monday.  Fever 101.3 in a.m.   OBJECTIVE  CBC:   Recent Labs Lab 04/25/17 0050 04/26/17 0623  WBC 15.9* 10.3  NEUTROABS 12.7*  --   HGB 18.1* 15.3  HCT 50.1 44.5  MCV 83.9 84.6  PLT 200 178    Basic Metabolic Panel:   Recent Labs Lab 04/25/17 0050 04/26/17 0623  NA 136 138  K 4.1 3.9  CL 101 104  CO2 21* 25  GLUCOSE 254* 110*  BUN 25* 25*  CREATININE 2.00* 2.25*  CALCIUM 9.9 9.4    Lipid Panel:     Component Value Date/Time   CHOL 177 04/29/2017 0517   TRIG 171 (H) 04/29/2017 0517   HDL 30 (L) 04/29/2017 0517   CHOLHDL 5.9 04/29/2017 0517   VLDL 34 04/29/2017 0517   LDLCALC 113 (H) 04/29/2017 0517   HgbA1c:  Lab Results  Component Value Date   HGBA1C 7.0 (H) 04/25/2017   Urine Drug Screen: No results found for: LABOPIA, COCAINSCRNUR, LABBENZ, AMPHETMU, THCU, LABBARB  Alcohol Level No results found for: [facility]  IMAGING I have personally reviewed the radiological images below and agree with the radiology interpretations.  MRA Head WO Contrast 04/28/2017 IMPRESSION: 1. Nonvisualized RIGHT posterior- inferior cerebellar artery seen with artifact, slow flow or occlusion. CT angiogram of the head and neck may be more definitive. (creatinine - 2.25) 2. Moderate stenoses of the anterior and posterior circulation compatible with atherosclerosis. 3. 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  Mr Brain Wo Contrast 04/26/2017 1. Scattered punctate foci of acute ischemia within both cerebral hemispheres and the right cerebellum. The largest focus is in the medial right cerebellum measures 10 mm. The distribution is most consistent with a central cardiac or aortic  embolic source. No hemorrhage or mass effect. 2. Old right cerebellar infarct and findings of chronic microvascular ischemia. 3. Normal MRI of the internal auditory canals and inner ear structures.  CT Head Wo Contrast 04/25/2017 1. No acute intracranial abnormality identified. 2. Stable moderate brain parenchymal volume loss and mild chronic microvascular ischemic changes of the brain. 3. Interval small chronic infarction within the right superior cerebellar hemisphere.  2D Echocardiogram 04/27/2017 LVEF 55-60%. Normal LV function; mild diastolic dysfunction with elevated LV filling pressure; moderate LVH; s/p AVR with mean gradient of 15 mmHg and no AI. Mildly calcified MV anulus.  Carotid Doppler Ultrasound 04/27/2017 - Extremely technically difficult due to bifurcation at the jaw line. - Bilateral - 1% TO 39% ICA stenosis. Vertebral artery flow is antegrade.  LE venous Doppler -no DVT   TEE pending   PHYSICAL EXAM Temp:  [98 F (36.7 C)-101.3 F (38.5 C)] 100 F (37.8 C) (10/20 1029) Pulse Rate:  [61-104] 90 (10/20 1029) Resp:  [18] 18 (10/20 1029) BP: (114-165)/(58-70) 128/64 (10/20 1029) SpO2:  [90 %-97 %] 90 % (10/20 1029) Weight:  [221 lb 1.6 oz (100.3 kg)] 221 lb 1.6 oz (100.3 kg) (10/20 0100)   Vitals:   04/30/17 0100 04/30/17 0136 04/30/17 0423 04/30/17 1029  BP: (!) 115/58  (!) 114/59 128/64  Pulse: 90  83 90  Resp: 18  18 18   Temp: (!) 101.3 F (38.5 C) 99.9 F (  37.7 C) 98.2 F (36.8 C) 100 F (37.8 C)  TempSrc: Oral Oral Oral Axillary  SpO2: 95%  90% 90%  Weight: 221 lb 1.6 oz (100.3 kg)     Height:         General - Well nourished, well developed, in no apparent distress.  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  Cardiovascular - Regular rate and rhythm.  Mental Status -  Level of arousal and orientation to time, place, and person were intact. Language including expression, naming, repetition, comprehension was  assessed and found intact. Attention span and concentration were normal.  Cranial Nerves II - XII - II - Legally blind at baseline, only to light perception bilaterally III, IV, VI - Extraocular movements intact. V - Facial sensation intact bilaterally. VII - Facial movement intact bilaterally. VIII - Hearing & vestibular intact bilaterally. X - Palate elevates symmetrically. XI - Chin turning & shoulder shrug intact bilaterally. XII - Tongue protrusion intact.  Motor Strength - The patient's strength was normal RUE and RLE, however LUE and LLE 4/5 proximal and 5/5 distally.  Bulk was normal and fasciculations were absent.   Motor Tone - Muscle tone was assessed at the neck and appendages and was normal.  Reflexes - The patient's reflexes were 1+ in all extremities and he had no pathological reflexes.  Sensory - Light touch was assessed and was symmetrical.    Coordination - The patient had normal movements in the hands with no ataxia or dysmetria.  Tremor was absent.  Gait and Station - deferred   ASSESSMENT/PLAN [name] is a 83 y.o. male with history of hypertension, hyperlipidemia, diabetes mellitus, CVA, macular degeneration presenting with vertigo.   Stroke: Small and punctate infarcts involving the right MCA, right MCA/ACA, right MCA/PCA, left PCA and right cerebellum, embolic pattern, etiology unclear  Resultant dizziness on movement  CT head no acute abnormality  MRI head Scattered punctate infarcts in cerebral hemispheres and larger 10mm focus in R cerebellum around old R cerebellum infarct.  MRA head - Moderate stenoses of the anterior and posterior circulation compatible with atherosclerosis.  Carotid Doppler  No significant stenosis  2D Echo  LVEF 55-60%. No embolic source suggested.  LV venous Doppler no DVT  Recommend TEE and loop recorder to rule out cardiac source  LDL 113  HgbA1c 7.0%  Enoxaparin for VTE prophylaxis Diet Carb Modified  Fluid consistency: Thin; Room service appropriate? Yes Diet NPO time specified Except for: Sips with Meds  aspirin 81 mg daily prior to admission, now on aspirin 325 mg daily.  Patient counseled to be compliant with his antithrombotic medications  Ongoing aggressive stroke risk factor management  Therapy recommendations:  SNF recommended    Disposition:  Pending  Fever  T-max 101.3  UA ; CXR ; blood cultures and influenza panel pending  Leukocytosis 15.9-10.3  Close monitoring  Hypertension  Stable Permissive hypertension (OK if < 220/120) but gradually normalize in 5-7 days Long-term BP goal normotensive <140/90  Hyperlipidemia  Home meds:  none  LDL 113, goal < 70  Now on Lipitor 20 mg daily   Continue statin at discharge  Diabetes  HgbA1c 7.0%, goal < 7.0  Controlled  On Lantus  SSI  CBG monitoring  Other Stroke Risk Factors  Advanced age  Obesity, Body mass index is 33.62 kg/m., recommend weight loss, diet and exercise as appropriate   Hx stroke/TIA  Coronary artery disease  Other Problems  CKD stage III - 25 /  2.25  Hospital day # 3  Marvel Plan, MD PhD Stroke Neurology 04/30/2017 3:55 PM   To contact Stroke Continuity provider, please refer to [email or web address]. After hours, contact General Neurology

## 2017-04-30 NOTE — Progress Notes (Signed)
PROGRESS NOTE    Johnny [name]  EAV:[id number] DOB: 11-Nov-1932 DOA: 04/24/2017 PCP: Darci Needle, MD   Chief Complaint  Patient presents with  . N/V    Brief Narrative:  HPI on 04/25/2017 by Dr. Pearson Grippe Sun [name]  is a 83 y.o. male, w hypertension, hyperlipidemia, Dm2,  CVA, macular degeneration, glaucoma apparently c/o vertigo ,  Denies hearing loss or tinnitus.  No family history.  No recent illness.   In ED. Wbc 15.9, Hgb 18.1, Plt 200 Bun 25, Creatinine 2.00 CT brain negative.  Pt will be admitted for new onset vertigo, n/v.    Assessment & Plan   Acute CVA -CT head showed no acute abnormality -MRI brain showed scattered punctate infarcts and cerebral hemispheres and larger 10 mm focus in right cerebellum around old right cerebellum infarct -MRA: Nonvisualized right posterior-inferior cerebellar artery seen with artifact, low flow or occlusion. Moderate stenosis of anterior posterior circulation compatible with atherosclerosis. 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 Bilateral 1-39% ICA stenosis. Vertebral artery flow is antegrade -Echocardiogram: EF 55-60%, grade 1 diastolic dysfunction, moderate LVH, status post aVR with mean gradient of 15 mmHg -Hemoglobin A1c 7, LDL 113 -Neurology consulted and appreciated -PT/OT recommended SNF -Continue aspirin 325 mg -Will start on atorvastatin today. Patient states he had been receiving injections up until one year ago for his cholesterol. -appears Dr. Lucia Gaskins is recommending TEE and starting anticoagulation if needed  Essential hypertension -treat as needed  Fever -? Source -check urine -check x ray -blood cultures pending -flu swab  Diabetes mellitus, type II -Continue Lantus, insulin sliding scale, CBG monitoring -Hemoglobin A1c 7  Chronic kidney disease, stage III -Creatinine baseline, continue monitor BMP  DVT Prophylaxis   SCDs  Code Status: Full  Family Communication: None at bedside  Disposition Plan: TEE planned on Monday  Consultants Neurology Cards (TEE)  Antibiotics   Anti-infectives    None      Subjective:   Had a fever last PM  Objective:   Vitals:   04/30/17 0100 04/30/17 0136 04/30/17 0423 04/30/17 1029  BP: (!) 115/58  (!) 114/59 128/64  Pulse: 90  83 90  Resp: 18  18 18   Temp: (!) 101.3 F (38.5 C) 99.9 F (37.7 C) 98.2 F (36.8 C) 100 F (37.8 C)  TempSrc: Oral Oral Oral Axillary  SpO2: 95%  90% 90%  Weight: 100.3 kg (221 lb 1.6 oz)     Height:        Intake/Output Summary (Last 24 hours) at 04/30/17 1256 Last data filed at 04/30/17 1230  Gross per 24 hour  Intake              200 ml  Output             1750 ml  Net            -1550 ml   Filed Weights   04/28/17 0416 04/29/17 0455 04/30/17 0100  Weight: 99.5 kg (219 lb 6.4 oz) 100.5 kg (221 lb 8 oz) 100.3 kg (221 lb 1.6 oz)    Exam  General: NAD  Cardiovascular: rrr  Respiratory: no wheezing  Abdomen: +BS, soft  Psych: pleasant and cooperative   Data Reviewed: I have personally reviewed following labs and imaging studies  CBC:  Recent Labs Lab 04/25/17 0050 04/26/17 0623  WBC 15.9* 10.3  NEUTROABS 12.7*  --   HGB 18.1* 15.3  HCT 50.1 44.5  MCV 83.9 84.6  PLT 200 178   Basic Metabolic Panel:  Recent Labs Lab 04/25/17 0050 04/26/17 0623  NA 136 138  K 4.1 3.9  CL 101 104  CO2 21* 25  GLUCOSE 254* 110*  BUN 25* 25*  CREATININE 2.00* 2.25*  CALCIUM 9.9 9.4   GFR: Estimated Creatinine Clearance: 29.1 mL/min (A) (by C-G formula based on SCr of 2.25 mg/dL (H)). Liver Function Tests:  Recent Labs Lab 04/25/17 0050 04/26/17 0623  AST 34 25  ALT 31 26  ALKPHOS 89 72  BILITOT 0.5 0.5  PROT 8.1 6.5  ALBUMIN 4.5 3.5   No results for input(s): LIPASE, AMYLASE in the last 168 hours. No results for input(s): AMMONIA in the last 168 hours. Coagulation Profile: No results for  input(s): INR, PROTIME in the last 168 hours. Cardiac Enzymes: No results for input(s): CKTOTAL, CKMB, CKMBINDEX, TROPONINI in the last 168 hours. BNP (last 3 results) No results for input(s): PROBNP in the last 8760 hours. HbA1C: No results for input(s): HGBA1C in the last 72 hours. CBG:  Recent Labs Lab 04/29/17 1603 04/29/17 2108 04/30/17 0610 04/30/17 0809 04/30/17 1148  GLUCAP 188* 205* 146* 200* 232*   Lipid Profile:  Recent Labs  04/29/17 0517  CHOL 177  HDL 30*  LDLCALC 113*  TRIG 171*  CHOLHDL 5.9   Thyroid Function Tests: No results for input(s): TSH, T4TOTAL, FREET4, T3FREE, THYROIDAB in the last 72 hours. Anemia Panel: No results for input(s): VITAMINB12, FOLATE, FERRITIN, TIBC, IRON, RETICCTPCT in the last 72 hours. Urine analysis:    Component Value Date/Time   COLORURINE YELLOW 01/02/2014 2236   APPEARANCEUR CLEAR 01/02/2014 2236   LABSPEC 1.017 01/02/2014 2236   PHURINE 5.0 01/02/2014 2236   GLUCOSEU >1000 (A) 01/02/2014 2236   HGBUR NEGATIVE 01/02/2014 2236   BILIRUBINUR NEGATIVE 01/02/2014 2236   KETONESUR NEGATIVE 01/02/2014 2236   PROTEINUR 100 (A) 01/02/2014 2236   UROBILINOGEN 0.2 01/02/2014 2236   NITRITE NEGATIVE 01/02/2014 2236   LEUKOCYTESUR NEGATIVE 01/02/2014 2236    )No results found for this or any previous visit (from the past 240 hour(s)).    Radiology Studies: Mr Shirlee Latch WG Contrast  Result Date: 04/28/2017 CLINICAL DATA:  Follow-up stroke. History of hypertension, hyperlipidemia, diabetes and stroke. EXAM: MRA HEAD WITHOUT CONTRAST TECHNIQUE: Angiographic images of the Circle of Willis were obtained using MRA technique without intravenous contrast. COMPARISON:  MRI of the head April 26, 2017 and MRA head October 07, 2011 FINDINGS: ANTERIOR CIRCULATION: Normal flow related enhancement of the included cervical, petrous, cavernous and supraclinoid internal carotid arteries. Patent anterior communicating artery. 2 mm  inferomedially directed aneurysm LEFT A1-2 junction. Patent anterior and middle cerebral arteries, including distal segments. Moderate tandem stenoses anterior middle cerebral artery's. No large vessel occlusion, high-grade stenosis. POSTERIOR CIRCULATION: Codominant vertebral artery's. No RIGHT posterior-inferior cerebellar artery flow related enhancement. Basilar artery is patent, with normal flow related enhancement of the remaining main branch vessels. Patent posterior cerebral arteries. Moderate bilateral posterior cerebral artery tandem stenoses. No high-grade stenosis,  aneurysm. ANATOMIC VARIANTS: None. Source images and MIP images were reviewed. IMPRESSION: 1. Nonvisualized RIGHT posterior- inferior cerebellar artery seen with artifact, slow flow or occlusion. CT angiogram of the head and neck may be more definitive. 2. Moderate stenoses of the anterior and posterior circulation compatible with atherosclerosis. 3. 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 Electronically Signed   By: Awilda Metro M.D.   On: 04/28/2017 15:35     Scheduled Meds: .  allopurinol  200 mg Oral QHS  . aspirin  300 mg Rectal Daily   Or  . aspirin  325 mg Oral Daily  . atorvastatin  20 mg Oral q1800  . docusate sodium  100 mg Oral BID  . ferrous sulfate  325 mg Oral BID  . folic acid  1 mg Oral Daily  . furosemide  40 mg Oral Daily  . hydrALAZINE  50 mg Oral TID  . insulin aspart  0-5 Units Subcutaneous QHS  . insulin aspart  0-9 Units Subcutaneous TID WC  . insulin glargine  30 Units Subcutaneous BID  . latanoprost  1 drop Both Eyes QHS  . metoprolol tartrate  50 mg Oral BID  . pantoprazole  40 mg Oral Daily  . senna  1 tablet Oral QHS  . sodium chloride flush  3 mL Intravenous Q12H   Continuous Infusions: . sodium chloride       LOS: 3 days   Time Spent in minutes   30 minutes  Mazelle Huebert U Leonides Minder D.O. on 04/30/2017 at 12:56 PM   [facility] Office  [phone]

## 2017-04-30 NOTE — Plan of Care (Signed)
Problem: Pain Managment: Goal: General experience of comfort will improve Outcome: Progressing No c/o pain during shift.   

## 2017-05-01 DIAGNOSIS — R7881 Bacteremia: Secondary | ICD-10-CM

## 2017-05-01 DIAGNOSIS — N3000 Acute cystitis without hematuria: Secondary | ICD-10-CM

## 2017-05-01 LAB — BLOOD CULTURE ID PANEL (REFLEXED)
Acinetobacter baumannii: NOT DETECTED
CANDIDA KRUSEI: NOT DETECTED
CANDIDA PARAPSILOSIS: NOT DETECTED
CARBAPENEM RESISTANCE: NOT DETECTED
Candida albicans: NOT DETECTED
Candida glabrata: NOT DETECTED
Candida tropicalis: NOT DETECTED
ENTEROCOCCUS SPECIES: NOT DETECTED
Enterobacter cloacae complex: NOT DETECTED
Enterobacteriaceae species: NOT DETECTED
Escherichia coli: NOT DETECTED
Haemophilus influenzae: NOT DETECTED
KLEBSIELLA OXYTOCA: NOT DETECTED
Klebsiella pneumoniae: NOT DETECTED
LISTERIA MONOCYTOGENES: NOT DETECTED
Neisseria meningitidis: NOT DETECTED
Proteus species: NOT DETECTED
Pseudomonas aeruginosa: DETECTED — AB
SERRATIA MARCESCENS: NOT DETECTED
STAPHYLOCOCCUS AUREUS BCID: NOT DETECTED
STAPHYLOCOCCUS SPECIES: NOT DETECTED
Streptococcus agalactiae: NOT DETECTED
Streptococcus pneumoniae: NOT DETECTED
Streptococcus pyogenes: NOT DETECTED
Streptococcus species: NOT DETECTED

## 2017-05-01 LAB — GLUCOSE, CAPILLARY
GLUCOSE-CAPILLARY: 296 mg/dL — AB (ref 65–99)
GLUCOSE-CAPILLARY: 287 mg/dL — AB (ref 65–99)
Glucose-Capillary: 317 mg/dL — ABNORMAL HIGH (ref 65–99)
Glucose-Capillary: 291 mg/dL — ABNORMAL HIGH (ref 65–99)

## 2017-05-01 MED ORDER — FOSFOMYCIN TROMETHAMINE 3 G PO PACK
3.0000 g | PACK | Freq: Once | ORAL | Status: DC
  Filled 2017-05-01: qty 3

## 2017-05-01 MED ORDER — DEXTROSE 5 % IV SOLN
2.0000 g | INTRAVENOUS | Status: DC
  Administered 2017-05-01 – 2017-05-07 (×7): 2 g via INTRAVENOUS
  Filled 2017-05-01 (×7): qty 2

## 2017-05-01 NOTE — Progress Notes (Signed)
PHARMACY - PHYSICIAN COMMUNICATION CRITICAL VALUE ALERT - BLOOD CULTURE IDENTIFICATION (BCID)  Results for orders placed or performed during the hospital encounter of 04/24/17  Blood Culture ID Panel (Reflexed) (Collected: 04/30/2017 12:02 PM)  Result Value Ref Range   Enterococcus species NOT DETECTED NOT DETECTED   Listeria monocytogenes NOT DETECTED NOT DETECTED   Staphylococcus species NOT DETECTED NOT DETECTED   Staphylococcus aureus NOT DETECTED NOT DETECTED   Streptococcus species NOT DETECTED NOT DETECTED   Streptococcus agalactiae NOT DETECTED NOT DETECTED   Streptococcus pneumoniae NOT DETECTED NOT DETECTED   Streptococcus pyogenes NOT DETECTED NOT DETECTED   Acinetobacter baumannii NOT DETECTED NOT DETECTED   Enterobacteriaceae species NOT DETECTED NOT DETECTED   Enterobacter cloacae complex NOT DETECTED NOT DETECTED   Escherichia coli NOT DETECTED NOT DETECTED   Klebsiella oxytoca NOT DETECTED NOT DETECTED   Klebsiella pneumoniae NOT DETECTED NOT DETECTED   Proteus species NOT DETECTED NOT DETECTED   Serratia marcescens NOT DETECTED NOT DETECTED   Carbapenem resistance NOT DETECTED NOT DETECTED   Haemophilus influenzae NOT DETECTED NOT DETECTED   Neisseria meningitidis NOT DETECTED NOT DETECTED   Pseudomonas aeruginosa DETECTED (A) NOT DETECTED   Candida albicans NOT DETECTED NOT DETECTED   Candida glabrata NOT DETECTED NOT DETECTED   Candida krusei NOT DETECTED NOT DETECTED   Candida parapsilosis NOT DETECTED NOT DETECTED   Candida tropicalis NOT DETECTED NOT DETECTED    Name of physician (or Provider) Contacted: Vann  Changes to prescribed antibiotics required: Change to cefepime  Cefepime 2g/24h Watch renal fx, cultures, duration of therapy F/u c+s  Baldemar FridayMasters, Caedan Sumler M 05/01/2017  10:13 AM

## 2017-05-01 NOTE — Progress Notes (Signed)
STROKE TEAM PROGRESS NOTE   SUBJECTIVE (INTERVAL HISTORY) He is resting comfortably in bed.  No family at bedside.  His vertigo is not problematic at rest but still present on sitting up or standing up as per patient.  Chronic vision loss bilaterally only to light perception.  Pending TEE and loop recorder on Monday.  Fever 101.3 in a.m.   OBJECTIVE  CBC:   Recent Labs Lab 04/25/17 0050 04/26/17 0623  WBC 15.9* 10.3  NEUTROABS 12.7*  --   HGB 18.1* 15.3  HCT 50.1 44.5  MCV 83.9 84.6  PLT 200 178    Basic Metabolic Panel:   Recent Labs Lab 04/25/17 0050 04/26/17 0623  NA 136 138  K 4.1 3.9  CL 101 104  CO2 21* 25  GLUCOSE 254* 110*  BUN 25* 25*  CREATININE 2.00* 2.25*  CALCIUM 9.9 9.4    Lipid Panel:     Component Value Date/Time   CHOL 177 04/29/2017 0517   TRIG 171 (H) 04/29/2017 0517   HDL 30 (L) 04/29/2017 0517   CHOLHDL 5.9 04/29/2017 0517   VLDL 34 04/29/2017 0517   LDLCALC 113 (H) 04/29/2017 0517   HgbA1c:  Lab Results  Component Value Date   HGBA1C 7.0 (H) 04/25/2017   Urine Drug Screen: No results found for: LABOPIA, COCAINSCRNUR, LABBENZ, AMPHETMU, THCU, LABBARB  Alcohol Level No results found for: [facility]  IMAGING I have personally reviewed the radiological images below and agree with the radiology interpretations.  MRA Head WO Contrast 04/28/2017 IMPRESSION: 1. Nonvisualized RIGHT posterior- inferior cerebellar artery seen with artifact, slow flow or occlusion. CT angiogram of the head and neck may be more definitive. (creatinine - 2.25) 2. Moderate stenoses of the anterior and posterior circulation compatible with atherosclerosis. 3. 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  Mr Brain Wo Contrast 04/26/2017 1. Scattered punctate foci of acute ischemia within both cerebral hemispheres and the right cerebellum. The largest focus is in the medial right cerebellum measures 10 mm. The distribution is most consistent with a central cardiac or aortic  embolic source. No hemorrhage or mass effect. 2. Old right cerebellar infarct and findings of chronic microvascular ischemia. 3. Normal MRI of the internal auditory canals and inner ear structures.  CT Head Wo Contrast 04/25/2017 1. No acute intracranial abnormality identified. 2. Stable moderate brain parenchymal volume loss and mild chronic microvascular ischemic changes of the brain. 3. Interval small chronic infarction within the right superior cerebellar hemisphere.  2D Echocardiogram 04/27/2017 LVEF 55-60%. Normal LV function; mild diastolic dysfunction with elevated LV filling pressure; moderate LVH; s/p AVR with mean gradient of 15 mmHg and no AI. Mildly calcified MV anulus.  Carotid Doppler Ultrasound 04/27/2017 - Extremely technically difficult due to bifurcation at the jaw line. - Bilateral - 1% TO 39% ICA stenosis. Vertebral artery flow is antegrade.  LE venous Doppler -no DVT   TEE pending   PHYSICAL EXAM Temp:  [97.6 F (36.4 C)-99.8 F (37.7 C)] 99.2 F (37.3 C) (10/21 1101) Pulse Rate:  [84-100] 84 (10/21 1101) Resp:  [18] 18 (10/21 1101) BP: (107-128)/(51-57) 117/56 (10/21 1101) SpO2:  [89 %-97 %] 89 % (10/21 1101) Weight:  [212 lb 8 oz (96.4 kg)] 212 lb 8 oz (96.4 kg) (10/21 0600)   Vitals:   05/01/17 0010 05/01/17 0417 05/01/17 0600 05/01/17 1101  BP: (!) 112/51 (!) 115/55  (!) 117/56  Pulse: 100 98  84  Resp: 18 18  18   Temp: 99.1 F (37.3 C) 99 F (  37.2 C)  99.2 F (37.3 C)  TempSrc: Oral Oral  Oral  SpO2: 95% 97%  (!) 89%  Weight:   212 lb 8 oz (96.4 kg)   Height:         General - Well nourished, well developed, in no apparent distress.  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  Cardiovascular - Regular rate and rhythm.  Mental Status -  Level of arousal and orientation to time, place, and person were intact. Language including expression, naming, repetition, comprehension was assessed and found  intact. Attention span and concentration were normal.  Cranial Nerves II - XII - II - Legally blind at baseline, only to light perception bilaterally III, IV, VI - Extraocular movements intact. V - Facial sensation intact bilaterally. VII - Facial movement intact bilaterally. VIII - Hearing & vestibular intact bilaterally. X - Palate elevates symmetrically. XI - Chin turning & shoulder shrug intact bilaterally. XII - Tongue protrusion intact.  Motor Strength - The patient's strength was normal RUE and RLE, however LUE and LLE 4/5 proximal and 5/5 distally.  Bulk was normal and fasciculations were absent.   Motor Tone - Muscle tone was assessed at the neck and appendages and was normal.  Reflexes - The patient's reflexes were 1+ in all extremities and he had no pathological reflexes.  Sensory - Light touch was assessed and was symmetrical.    Coordination - The patient had normal movements in the hands with no ataxia or dysmetria.  Tremor was absent.  Gait and Station - deferred   ASSESSMENT/PLAN Mr. MAJID MCCRAVY is a 83 y.o. male with history of hypertension, hyperlipidemia, diabetes mellitus, CVA, macular degeneration presenting with vertigo.   Stroke: Small and punctate infarcts involving the right MCA, right MCA/ACA, right MCA/PCA, left PCA and right cerebellum, embolic pattern, etiology unclear  Resultant dizziness on movement  CT head no acute abnormality  MRI head Scattered punctate infarcts in cerebral hemispheres and larger 10mm focus in R cerebellum around old R cerebellum infarct.  MRA head - Moderate stenoses of the anterior and posterior circulation compatible with atherosclerosis.  Carotid Doppler  No significant stenosis  2D Echo  LVEF 55-60%. No embolic source suggested.  LV venous Doppler no DVT  Recommend TEE and loop recorder to rule out cardiac source  LDL 113  HgbA1c 7.0%  Enoxaparin for VTE prophylaxis Diet Carb Modified Fluid consistency:  Thin; Room service appropriate? Yes Diet NPO time specified Except for: Sips with Meds  aspirin 81 mg daily prior to admission, now on aspirin 325 mg daily.  Patient counseled to be compliant with his antithrombotic medications  Ongoing aggressive stroke risk factor management  Therapy recommendations:  SNF recommended    Disposition:  Pending  Bacteremia   T-max 101.3 -> 99.2  UA - WBC TNTC ; Urine culture - pending   CXR - NAD   Blood cultures ->  Pseudomonas -> Cefepime started 05/01/17  Influenza panel - negative  Leukocytosis 15.9->10.3  TEE pending to rule out endocarditis.  Hypertension  Stable Permissive hypertension (OK if < 220/120) but gradually normalize in 5-7 days Long-term BP goal normotensive <140/90  Hyperlipidemia  Home meds:  none  LDL 113, goal < 70  Now on Lipitor 20 mg daily   Continue statin at discharge  Diabetes  HgbA1c 7.0%, goal < 7.0  Controlled  On Lantus  SSI  CBG monitoring  Other Stroke Risk Factors  Advanced age  Obesity, Body mass index is 32.31 kg/m.,  recommend weight loss, diet and exercise as appropriate   Hx stroke/TIA  Coronary artery disease  Other Problems  CKD stage III - Cre 2.25   Hospital day # 4  Marvel PlanJindong Oluwasemilore Pascuzzi, MD PhD Stroke Neurology 05/01/2017 12:04 PM   To contact Stroke Continuity provider, please refer to [email or web address]. After hours, contact General Neurology

## 2017-05-01 NOTE — Progress Notes (Signed)
PROGRESS NOTE    Johnny [name]  ZOX:[id number] DOB: 1932/07/25 DOA: 04/24/2017 PCP: Darci Needle, MD   Chief Complaint  Patient presents with  . N/V    Brief Narrative:  HPI on 04/25/2017 by Dr. Pearson Grippe Johnny [name]  is a 83 y.o. male, w hypertension, hyperlipidemia, Dm2,  CVA, macular degeneration, glaucoma apparently c/o vertigo ,  Denies hearing loss or tinnitus.  No family history.  No recent illness.   In ED. Wbc 15.9, Hgb 18.1, Plt 200 Bun 25, Creatinine 2.00 CT brain negative.  Pt will be admitted for new onset vertigo, n/v.    Assessment & Plan   Acute CVA -CT head showed no acute abnormality -MRI brain showed scattered punctate infarcts and cerebral hemispheres and larger 10 mm focus in right cerebellum around old right cerebellum infarct -MRA: Nonvisualized right posterior-inferior cerebellar artery seen with artifact, low flow or occlusion. Moderate stenosis of anterior posterior circulation compatible with atherosclerosis. 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 Bilateral 1-39% ICA stenosis. Vertebral artery flow is antegrade -Echocardiogram: EF 55-60%, grade 1 diastolic dysfunction, moderate LVH, status post aVR with mean gradient of 15 mmHg -Hemoglobin A1c 7, LDL 113 -Neurology consulted and appreciated -PT/OT recommended SNF -Continue aspirin 325 mg - atorvastatin: Patient states he had been receiving injections up until one year ago for his cholesterol. -appears Dr. Lucia Gaskins is recommending TEE and starting anticoagulation if needed- plan for TEE in AM  Essential hypertension -treat as needed  Fever due to pseudomonas bacteremia -urine culture pending but U/A suggestive of infection -check x ray normal -blood cultures-- follow -flu swab negative  Diabetes mellitus, type II -Continue Lantus, insulin sliding scale, CBG monitoring -Hemoglobin A1c 7  Chronic kidney disease, stage  III -Creatinine baseline, continue monitor BMP  DVT Prophylaxis  SCDs  Code Status: Full  Family Communication: None at bedside  Disposition Plan: TEE planned on Monday  Consultants Neurology Cards (TEE)  Antibiotics   Anti-infectives    Start     Dose/Rate Route Frequency Ordered Stop   05/01/17 1100  ceFEPIme (MAXIPIME) 2 g in dextrose 5 % 50 mL IVPB     2 g 100 mL/hr over 30 Minutes Intravenous Every 24 hours 05/01/17 1032        Subjective:   No complaints today- did not feel febrile  Objective:   Vitals:   05/01/17 0010 05/01/17 0417 05/01/17 0600 05/01/17 1101  BP: (!) 112/51 (!) 115/55  (!) 117/56  Pulse: 100 98  84  Resp: 18 18  18   Temp: 99.1 F (37.3 C) 99 F (37.2 C)  99.2 F (37.3 C)  TempSrc: Oral Oral  Oral  SpO2: 95% 97%  (!) 89%  Weight:   96.4 kg (212 lb 8 oz)   Height:        Intake/Output Summary (Last 24 hours) at 05/01/17 1236 Last data filed at 05/01/17 1000  Gross per 24 hour  Intake              240 ml  Output              900 ml  Net             -660 ml   Filed Weights   04/29/17 0455 04/30/17 0100 05/01/17 0600  Weight: 100.5 kg (221 lb 8 oz) 100.3 kg (221 lb 1.6 oz) 96.4 kg (212 lb 8 oz)    Exam  General:  in bed, NAD  Cardiovascular: rrr  Respiratory: clear, no wheezing  Abdomen: +Bs, soft  -legally blind  Psych: pleasant   Data Reviewed: I have personally reviewed following labs and imaging studies  CBC:  Recent Labs Lab 04/25/17 0050 04/26/17 0623  WBC 15.9* 10.3  NEUTROABS 12.7*  --   HGB 18.1* 15.3  HCT 50.1 44.5  MCV 83.9 84.6  PLT 200 178   Basic Metabolic Panel:  Recent Labs Lab 04/25/17 0050 04/26/17 0623  NA 136 138  K 4.1 3.9  CL 101 104  CO2 21* 25  GLUCOSE 254* 110*  BUN 25* 25*  CREATININE 2.00* 2.25*  CALCIUM 9.9 9.4   GFR: Estimated Creatinine Clearance: 28.5 mL/min (A) (by C-G formula based on SCr of 2.25 mg/dL (H)). Liver Function Tests:  Recent Labs Lab  04/25/17 0050 04/26/17 0623  AST 34 25  ALT 31 26  ALKPHOS 89 72  BILITOT 0.5 0.5  PROT 8.1 6.5  ALBUMIN 4.5 3.5   No results for input(s): LIPASE, AMYLASE in the last 168 hours. No results for input(s): AMMONIA in the last 168 hours. Coagulation Profile: No results for input(s): INR, PROTIME in the last 168 hours. Cardiac Enzymes: No results for input(s): CKTOTAL, CKMB, CKMBINDEX, TROPONINI in the last 168 hours. BNP (last 3 results) No results for input(s): PROBNP in the last 8760 hours. HbA1C: No results for input(s): HGBA1C in the last 72 hours. CBG:  Recent Labs Lab 04/30/17 1148 04/30/17 1640 04/30/17 2103 05/01/17 0601 05/01/17 1125  GLUCAP 232* 265* 408* 296* 317*   Lipid Profile:  Recent Labs  04/29/17 0517  CHOL 177  HDL 30*  LDLCALC 113*  TRIG 171*  CHOLHDL 5.9   Thyroid Function Tests: No results for input(s): TSH, T4TOTAL, FREET4, T3FREE, THYROIDAB in the last 72 hours. Anemia Panel: No results for input(s): VITAMINB12, FOLATE, FERRITIN, TIBC, IRON, RETICCTPCT in the last 72 hours. Urine analysis:    Component Value Date/Time   COLORURINE YELLOW 04/30/2017 1731   APPEARANCEUR CLOUDY (A) 04/30/2017 1731   LABSPEC 1.016 04/30/2017 1731   PHURINE 5.0 04/30/2017 1731   GLUCOSEU 50 (A) 04/30/2017 1731   HGBUR SMALL (A) 04/30/2017 1731   BILIRUBINUR NEGATIVE 04/30/2017 1731   KETONESUR NEGATIVE 04/30/2017 1731   PROTEINUR 100 (A) 04/30/2017 1731   UROBILINOGEN 0.2 01/02/2014 2236   NITRITE NEGATIVE 04/30/2017 1731   LEUKOCYTESUR LARGE (A) 04/30/2017 1731    ) Recent Results (from the past 240 hour(s))  Culture, blood (Routine X 2) w Reflex to ID Panel     Status: None (Preliminary result)   Collection Time: 04/30/17 12:02 PM  Result Value Ref Range Status   Specimen Description BLOOD LEFT ANTECUBITAL  Final   Special Requests   Final    BOTTLES DRAWN AEROBIC ONLY Blood Culture adequate volume   Culture  Setup Time   Final    GRAM NEGATIVE  RODS AEROBIC BOTTLE ONLY CRITICAL RESULT CALLED TO, READ BACK BY AND VERIFIED WITH: A MASTERS,PHARMD AT 1011 05/01/17 BY L BENFIELD    Culture GRAM NEGATIVE RODS  Final   Report Status PENDING  Incomplete  Blood Culture ID Panel (Reflexed)     Status: Abnormal   Collection Time: 04/30/17 12:02 PM  Result Value Ref Range Status   Enterococcus species NOT DETECTED NOT DETECTED Final   Listeria monocytogenes NOT DETECTED NOT DETECTED Final   Staphylococcus species NOT DETECTED NOT DETECTED Final   Staphylococcus aureus NOT DETECTED NOT DETECTED Final  Streptococcus species NOT DETECTED NOT DETECTED Final   Streptococcus agalactiae NOT DETECTED NOT DETECTED Final   Streptococcus pneumoniae NOT DETECTED NOT DETECTED Final   Streptococcus pyogenes NOT DETECTED NOT DETECTED Final   Acinetobacter baumannii NOT DETECTED NOT DETECTED Final   Enterobacteriaceae species NOT DETECTED NOT DETECTED Final   Enterobacter cloacae complex NOT DETECTED NOT DETECTED Final   Escherichia coli NOT DETECTED NOT DETECTED Final   Klebsiella oxytoca NOT DETECTED NOT DETECTED Final   Klebsiella pneumoniae NOT DETECTED NOT DETECTED Final   Proteus species NOT DETECTED NOT DETECTED Final   Serratia marcescens NOT DETECTED NOT DETECTED Final   Carbapenem resistance NOT DETECTED NOT DETECTED Final   Haemophilus influenzae NOT DETECTED NOT DETECTED Final   Neisseria meningitidis NOT DETECTED NOT DETECTED Final   Pseudomonas aeruginosa DETECTED (A) NOT DETECTED Final    Comment: CRITICAL RESULT CALLED TO, READ BACK BY AND VERIFIED WITH: A MASTERS,PHARMD AT 1011 05/01/17 BY L BENFIELD    Candida albicans NOT DETECTED NOT DETECTED Final   Candida glabrata NOT DETECTED NOT DETECTED Final   Candida krusei NOT DETECTED NOT DETECTED Final   Candida parapsilosis NOT DETECTED NOT DETECTED Final   Candida tropicalis NOT DETECTED NOT DETECTED Final      Radiology Studies: Dg Chest 2 View  Result Date:  04/30/2017 CLINICAL DATA:  Fever EXAM: CHEST  2 VIEW COMPARISON:  01/05/2015 FINDINGS: There is mild bilateral interstitial prominence. There is no focal consolidation. There is no pleural effusion or pneumothorax. The heart and mediastinal contours are unremarkable. There is evidence of prior median sternotomy and aortic valve replacement. The osseous structures are unremarkable. IMPRESSION: No active cardiopulmonary disease. Electronically Signed   By: Elige KoHetal  Patel   On: 04/30/2017 15:39     Scheduled Meds: . allopurinol  200 mg Oral QHS  . aspirin  300 mg Rectal Daily   Or  . aspirin  325 mg Oral Daily  . atorvastatin  20 mg Oral q1800  . docusate sodium  100 mg Oral BID  . ferrous sulfate  325 mg Oral BID  . folic acid  1 mg Oral Daily  . furosemide  40 mg Oral Daily  . hydrALAZINE  50 mg Oral TID  . insulin aspart  0-5 Units Subcutaneous QHS  . insulin aspart  0-9 Units Subcutaneous TID WC  . insulin glargine  30 Units Subcutaneous BID  . latanoprost  1 drop Both Eyes QHS  . metoprolol tartrate  50 mg Oral BID  . pantoprazole  40 mg Oral Daily  . senna  1 tablet Oral QHS  . sodium chloride flush  3 mL Intravenous Q12H   Continuous Infusions: . sodium chloride    . ceFEPime (MAXIPIME) IV 2 g (05/01/17 1218)     LOS: 4 days   Time Spent in minutes   30 minutes  Xayne Brumbaugh U Osher Oettinger D.O. on 05/01/2017 at 12:36 PM   [facility] Office  [phone]

## 2017-05-02 ENCOUNTER — Encounter (HOSPITAL_COMMUNITY): Payer: Self-pay | Admitting: *Deleted

## 2017-05-02 ENCOUNTER — Inpatient Hospital Stay (HOSPITAL_COMMUNITY): Payer: Medicare Other

## 2017-05-02 ENCOUNTER — Encounter (HOSPITAL_COMMUNITY)
Admission: EM | Disposition: A | Discharge status: Skilled Nursing Facility | Payer: Self-pay | Source: Home / Self Care | Attending: Internal Medicine

## 2017-05-02 DIAGNOSIS — N390 Urinary tract infection, site not specified: Secondary | ICD-10-CM

## 2017-05-02 DIAGNOSIS — R7881 Bacteremia: Secondary | ICD-10-CM

## 2017-05-02 DIAGNOSIS — I350 Nonrheumatic aortic (valve) stenosis: Secondary | ICD-10-CM

## 2017-05-02 DIAGNOSIS — D72829 Elevated white blood cell count, unspecified: Secondary | ICD-10-CM

## 2017-05-02 HISTORY — PX: TEE WITHOUT CARDIOVERSION: SHX5443

## 2017-05-02 LAB — GLUCOSE, CAPILLARY
GLUCOSE-CAPILLARY: 242 mg/dL — AB (ref 65–99)
GLUCOSE-CAPILLARY: 222 mg/dL — AB (ref 65–99)
GLUCOSE-CAPILLARY: 199 mg/dL — AB (ref 65–99)
Glucose-Capillary: 237 mg/dL — ABNORMAL HIGH (ref 65–99)
Glucose-Capillary: 222 mg/dL — ABNORMAL HIGH (ref 65–99)

## 2017-05-02 LAB — CBC
HCT: 40.1 % (ref 39.0–52.0)
Hemoglobin: 14.3 g/dL (ref 13.0–17.0)
MCH: 30.2 pg (ref 26.0–34.0)
MCHC: 35.7 g/dL (ref 30.0–36.0)
MCV: 84.6 fL (ref 78.0–100.0)
PLATELETS: 193 10*3/uL (ref 150–400)
RBC: 4.74 MIL/uL (ref 4.22–5.81)
RDW: 14.2 % (ref 11.5–15.5)
WBC: 31.9 10*3/uL — AB (ref 4.0–10.5)

## 2017-05-02 LAB — BASIC METABOLIC PANEL
ANION GAP: 10 (ref 5–15)
BUN: 41 mg/dL — AB (ref 6–20)
CALCIUM: 9.2 mg/dL (ref 8.9–10.3)
CO2: 21 mmol/L — ABNORMAL LOW (ref 22–32)
CREATININE: 2.76 mg/dL — AB (ref 0.61–1.24)
Chloride: 101 mmol/L (ref 101–111)
GFR calc Af Amer: 23 mL/min — ABNORMAL LOW (ref >60)
GFR, EST NON AFRICAN AMERICAN: 20 mL/min — AB (ref >60)
GLUCOSE: 254 mg/dL — AB (ref 65–99)
Potassium: 4 mmol/L (ref 3.5–5.1)
Sodium: 132 mmol/L — ABNORMAL LOW (ref 135–145)

## 2017-05-02 SURGERY — ECHOCARDIOGRAM, TRANSESOPHAGEAL
Anesthesia: Moderate Sedation

## 2017-05-02 MED ORDER — DIPHENHYDRAMINE HCL 50 MG/ML IJ SOLN
INTRAMUSCULAR | Status: AC
  Filled 2017-05-02: qty 1

## 2017-05-02 MED ORDER — MIDAZOLAM HCL 5 MG/ML IJ SOLN
INTRAMUSCULAR | Status: AC
  Filled 2017-05-02: qty 2

## 2017-05-02 MED ORDER — BUTAMBEN-TETRACAINE-BENZOCAINE 2-2-14 % EX AERO
INHALATION_SPRAY | CUTANEOUS | Status: DC | PRN
  Administered 2017-05-02: 2 via TOPICAL

## 2017-05-02 MED ORDER — SODIUM CHLORIDE 0.9 % IV SOLN
INTRAVENOUS | Status: DC
  Administered 2017-05-02: 12:00:00 via INTRAVENOUS

## 2017-05-02 MED ORDER — FENTANYL CITRATE (PF) 100 MCG/2ML IJ SOLN
INTRAMUSCULAR | Status: DC | PRN
  Administered 2017-05-02: 25 ug via INTRAVENOUS

## 2017-05-02 MED ORDER — MIDAZOLAM HCL 10 MG/2ML IJ SOLN
INTRAMUSCULAR | Status: DC | PRN
  Administered 2017-05-02 (×2): 2 mg via INTRAVENOUS

## 2017-05-02 MED ORDER — INSULIN GLARGINE 100 UNIT/ML ~~LOC~~ SOLN
35.0000 [IU] | Freq: Two times a day (BID) | SUBCUTANEOUS | Status: DC
  Administered 2017-05-02 – 2017-05-07 (×10): 35 [IU] via SUBCUTANEOUS
  Filled 2017-05-02 (×11): qty 0.35

## 2017-05-02 MED ORDER — FENTANYL CITRATE (PF) 100 MCG/2ML IJ SOLN
INTRAMUSCULAR | Status: AC
  Filled 2017-05-02: qty 2

## 2017-05-02 MED ORDER — SODIUM CHLORIDE 0.9 % IV SOLN
INTRAVENOUS | Status: DC
  Administered 2017-05-02 – 2017-05-05 (×5): via INTRAVENOUS

## 2017-05-02 NOTE — H&P (View-Only) (Signed)
Consent signed for TEE 

## 2017-05-02 NOTE — Progress Notes (Signed)
EP service asked to see the patient for evaluation of loop implant. The patient has had fever (101.3 though in the last 24 hours T max 99.8), is being treated for pseudomonas bacteremia, with WBC that continues to increase.  Despite not being an intravascular device, would hold off on ILR until treated/infection issue resolved.  Please recall EP service when ready and discharge planned.  Francis Dowseenee Ursuy, PA-C  Leonia ReevesGregg Taylor,M.D.

## 2017-05-02 NOTE — Progress Notes (Signed)
STROKE TEAM PROGRESS NOTE   SUBJECTIVE (INTERVAL HISTORY) He is sitting in chair. Voiced no complains.  Fever resolved. On antibiotics, however significant leukocytosis today comparing with yesterday.  TEE pending to rule out endocarditis.   OBJECTIVE  CBC:   Recent Labs Lab 04/26/17 0623 05/02/17 0312  WBC 10.3 31.9*  HGB 15.3 14.3  HCT 44.5 40.1  MCV 84.6 84.6  PLT 178 193    Basic Metabolic Panel:   Recent Labs Lab 04/26/17 0623 05/02/17 0312  NA 138 132*  K 3.9 4.0  CL 104 101  CO2 25 21*  GLUCOSE 110* 254*  BUN 25* 41*  CREATININE 2.25* 2.76*  CALCIUM 9.4 9.2    Lipid Panel:     Component Value Date/Time   CHOL 177 04/29/2017 0517   TRIG 171 (H) 04/29/2017 0517   HDL 30 (L) 04/29/2017 0517   CHOLHDL 5.9 04/29/2017 0517   VLDL 34 04/29/2017 0517   LDLCALC 113 (H) 04/29/2017 0517   HgbA1c:  Lab Results  Component Value Date   HGBA1C 7.0 (H) 04/25/2017   Urine Drug Screen: No results found for: LABOPIA, COCAINSCRNUR, LABBENZ, AMPHETMU, THCU, LABBARB  Alcohol Level No results found for: ETH  IMAGING I have personally reviewed the radiological images below and agree with the radiology interpretations.  MRA Head WO Contrast 04/28/2017 IMPRESSION: 1. Nonvisualized RIGHT posterior- inferior cerebellar artery seen with artifact, slow flow or occlusion. CT angiogram of the head and neck may be more definitive. (creatinine - 2.25) 2. Moderate stenoses of the anterior and posterior circulation compatible with atherosclerosis. 3. 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  Mr Brain Wo Contrast 04/26/2017 1. Scattered punctate foci of acute ischemia within both cerebral hemispheres and the right cerebellum. The largest focus is in the medial right cerebellum measures 10 mm. The distribution is most consistent with a central cardiac or aortic embolic source. No hemorrhage or mass effect. 2. Old right cerebellar infarct and findings of chronic microvascular  ischemia. 3. Normal MRI of the internal auditory canals and inner ear structures.  CT Head Wo Contrast 04/25/2017 1. No acute intracranial abnormality identified. 2. Stable moderate brain parenchymal volume loss and mild chronic microvascular ischemic changes of the brain. 3. Interval small chronic infarction within the right superior cerebellar hemisphere.  2D Echocardiogram 04/27/2017 LVEF 55-60%. Normal LV function; mild diastolic dysfunction with elevated LV filling pressure; moderate LVH; s/p AVR with mean gradient of 15 mmHg and no AI. Mildly calcified MV anulus.  Carotid Doppler Ultrasound 04/27/2017 - Extremely technically difficult due to bifurcation at the jaw line. - Bilateral - 1% TO 39% ICA stenosis. Vertebral artery flow is antegrade.  LE venous Doppler -no DVT   TEE - Left ventricle: There was mild concentric hypertrophy. Systolic   function was normal. The estimated ejection fraction was in the   range of 60% to 65%. Wall motion was normal; there were no   regional wall motion abnormalities. - Aortic valve: There was moderate stenosis. Mean gradient (S): 28   mm Hg. Peak gradient (S): 48 mm Hg. - Left atrium: No evidence of thrombus in the atrial cavity or   appendage. No evidence of thrombus in the atrial cavity or   appendage. - Right atrium: No evidence of thrombus in the atrial cavity or   appendage. - Atrial septum: There was a patent foramen ovale. - Pulmonary arteries: Systolic pressure was mildly increased. PA   peak pressure: 39 mm Hg (S).   PHYSICAL EXAM Temp:  [97.9  F (36.6 C)-99.8 F (37.7 C)] 98.1 F (36.7 C) (10/22 1400) Pulse Rate:  [73-88] 75 (10/22 1400) Resp:  [16-33] 18 (10/22 1400) BP: (110-197)/(58-76) 150/66 (10/22 1400) SpO2:  [90 %-97 %] 95 % (10/22 1400)   Vitals:   05/02/17 1300 05/02/17 1310 05/02/17 1320 05/02/17 1400  BP: 124/63 (!) 134/58 137/76 (!) 150/66  Pulse: 80 77 77 75  Resp: 18 20 20 18   Temp:    98.1 F (36.7  C)  TempSrc:    Oral  SpO2:  93% 93% 95%  Weight:      Height:         General - Well nourished, well developed, in no apparent distress.  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  Cardiovascular - Regular rate and rhythm.  Mental Status -  Level of arousal and orientation to time, place, and person were intact. Language including expression, naming, repetition, comprehension was assessed and found intact. Attention span and concentration were normal.  Cranial Nerves II - XII - II - Legally blind at baseline, only to light perception bilaterally III, IV, VI - Extraocular movements intact. V - Facial sensation intact bilaterally. VII - Facial movement intact bilaterally. VIII - Hearing & vestibular intact bilaterally. X - Palate elevates symmetrically. XI - Chin turning & shoulder shrug intact bilaterally. XII - Tongue protrusion intact.  Motor Strength - The patient's strength was normal RUE and RLE, however LUE and LLE 4/5 proximal and 5/5 distally.  Bulk was normal and fasciculations were absent.   Motor Tone - Muscle tone was assessed at the neck and appendages and was normal.  Reflexes - The patient's reflexes were 1+ in all extremities and he had no pathological reflexes.  Sensory - Light touch was assessed and was symmetrical.    Coordination - The patient had normal movements in the hands with no ataxia or dysmetria.  Tremor was absent.  Gait and Station - deferred   ASSESSMENT/PLAN [name] is a 83 y.o. male with history of hypertension, hyperlipidemia, diabetes mellitus, CVA, macular degeneration presenting with vertigo.   Stroke: Small and punctate infarcts involving the right MCA, right MCA/ACA, right MCA/PCA, left PCA and right cerebellum, embolic pattern, etiology unclear  Resultant dizziness on movement  CT head no acute abnormality  MRI head Scattered punctate infarcts in cerebral hemispheres and larger 10mm focus in  R cerebellum around old R cerebellum infarct.  MRA head - Moderate stenoses of the anterior and posterior circulation compatible with atherosclerosis.  Carotid Doppler  No significant stenosis  2D Echo  LVEF 55-60%. No embolic source suggested.  LV venous Doppler no DVT  TEE EF 60-65%, no vegetations, no endocarditis, positive PFO  loop recorder pending - OK to perform after leukocytosis/bacteremia resolves  LDL 113  HgbA1c 7.0%  Enoxaparin for VTE prophylaxis Diet Carb Modified Fluid consistency: Thin; Room service appropriate? Yes  aspirin 81 mg daily prior to admission, now on aspirin 325 mg daily.  Patient counseled to be compliant with his antithrombotic medications  Ongoing aggressive stroke risk factor management  Therapy recommendations:  SNF    Disposition:  Pending  Bacteremia / UTI  Spiking fever 101.3 05/01/17  UA - WBC TNTC ; Urine culture - G- rods   CXR - NAD   Blood cultures ->  Pseudomonas -> Cefepime started 05/01/17  Influenza panel - negative  Leukocytosis 15.9->10.3->31.9  TEE showed no endocarditis  Hypertension  Stable Permissive hypertension (OK if < 220/120) but gradually normalize  in 5-7 days Long-term BP goal normotensive <140/90  Hyperlipidemia  Home meds:  none  LDL 113, goal < 70  Now on Lipitor 20 mg daily   Continue statin at discharge  Diabetes  HgbA1c 7.0%, goal < 7.0  Controlled  On Lantus  SSI  CBG monitoring  Other Stroke Risk Factors  Advanced age  Obesity, Body mass index is 32.31 kg/m., recommend weight loss, diet and exercise as appropriate   Hx stroke/TIA  Coronary artery disease  Other Problems  CKD stage III - Cre 2.25   Hospital day # 5  Neurology will sign off. Please call with questions. Pt [facility] about 6 weeks. Thanks for the consult.   Marvel PlanJindong Meredeth Furber, MD PhD Stroke Neurology 05/02/2017 5:42 PM   To contact Stroke Continuity provider, please  refer to [email or web address]. After hours, contact General Neurology

## 2017-05-02 NOTE — Progress Notes (Signed)
Physical Therapy Treatment Patient Details Name: Johnny FellingSolomon B [name] MRN: [id number] DOB: [birth year] Today's Date: 05/02/2017    History of Present Illness Johnny HintSolomon [name]  is a 83 y.o. male, w hypertension, hyperlipidemia, Dm2,  CVA, macular degeneration, glaucoma apparently c/o vertigo; CT head neg, MRI obtained on 04/26/17 revealed scattered punctate foci of acute ischemia within both cerebral hemispheres and the right cerebellum; largest focus was in the medial right cerebellum, Also seen was an old right cerebellar infarct and findings of chronic microvascular ischemia; [facility]    PT Comments    Patient is progressing toward mobility goals. Treatment focus today on improving gait mechanics. Able to take about 8 steps before LOB needing min A to correct. Current POC remains appropriate. PT will continue to follow acutely and progress as tolerated.   Follow Up Recommendations  SNF (vs HHPT depending on progress)     Equipment Recommendations  None recommended by PT    Recommendations for Other Services       Precautions / Restrictions Precautions Precautions: Fall Precaution Comments: legally blind Restrictions Weight Bearing Restrictions: No    Mobility  Bed Mobility Overal bed mobility: Needs Assistance Bed Mobility: Supine to Sit     Supine to sit: Supervision     General bed mobility comments: pt needed min VCs for positioning and to translate hips to EOB; requires increased time and effort. supervision for safety  Transfers Overall transfer level: Needs assistance Equipment used: Rolling walker (2 wheeled) Transfers: Sit to/from Stand Sit to Stand: Min guard         General transfer comment: pt needed min VCs to scoot forward and for proper hand placement to RW. Elevated bed height slightly. Min guard for safety but no physical assist to power up today  Ambulation/Gait Ambulation/Gait assistance: Min guard;Min  assist Ambulation Distance (Feet): 75 Feet Assistive device: Rolling walker (2 wheeled) Gait Pattern/deviations: Step-through pattern;Step-to pattern;Decreased stride length;Drifts right/left;Trunk flexed Gait velocity: decreased Gait velocity interpretation: <1.8 ft/sec, indicative of risk for recurrent falls General Gait Details: pt needs VCs for direction d/t visual deficits. Slow and unsteady but able to take about 8 steps before LOB with min A to correct.    Stairs            Wheelchair Mobility    Modified Rankin (Stroke Patients Only) Modified Rankin (Stroke Patients Only) Pre-Morbid Rankin Score: Slight disability Modified Rankin: Moderately severe disability     Balance Overall balance assessment: Needs assistance Sitting-balance support: Feet supported;No upper extremity supported Sitting balance-Leahy Scale: Fair Sitting balance - Comments: static sitting EOB    Standing balance support: Bilateral upper extremity supported;During functional activity Standing balance-Leahy Scale: Poor Standing balance comment: patient reliant on UE support from RW at all times                            Cognition Arousal/Alertness: Awake/alert Behavior During Therapy: WFL for tasks assessed/performed Overall Cognitive Status: Within Functional Limits for tasks assessed                                        Exercises      General Comments        Pertinent Vitals/Pain Pain Assessment: No/denies pain    Home Living  Prior Function            PT Goals (current goals can now be found in the care plan section) Acute Rehab PT Goals Patient Stated Goal: return to walking and prior level of function PT Goal Formulation: With patient/family Time For Goal Achievement: 05/02/17 Potential to Achieve Goals: Good Progress towards PT goals: Progressing toward goals    Frequency    Min 3X/week      PT Plan  Current plan remains appropriate    Co-evaluation              AM-PAC PT "6 Clicks" Daily Activity  Outcome Measure  Difficulty turning over in bed (including adjusting bedclothes, sheets and blankets)?: A Little Difficulty moving from lying on back to sitting on the side of the bed? : A Little Difficulty sitting down on and standing up from a chair with arms (e.g., wheelchair, bedside commode, etc,.)?: A Lot Help needed moving to and from a bed to chair (including a wheelchair)?: A Lot Help needed walking in hospital room?: A Lot Help needed climbing 3-5 steps with a railing? : Total 6 Click Score: 13    End of Session Equipment Utilized During Treatment: Gait belt Activity Tolerance: Patient tolerated treatment well Patient left: in chair;with chair alarm set;with call bell/phone within reach Nurse Communication: Mobility status PT Visit Diagnosis: Other abnormalities of gait and mobility (R26.89)     Time: 1610-9604 PT Time Calculation (min) (ACUTE ONLY): 20 min  Charges:  $Gait Training: 8-22 mins                    G CodesMckinley Jewel, SPT [phone] office    Fonnie Birkenhead 05/02/2017, 11:30 AM

## 2017-05-02 NOTE — Care Management Important Message (Signed)
Important Message  Patient Details  Name: Johnny [name] MRN: [id number] Date of Birth: 1932/08/23   Medicare Important Message Given:  Yes    [name] 05/02/2017, 11:19 AM

## 2017-05-02 NOTE — Progress Notes (Signed)
  Echocardiogram Echocardiogram Transesophageal has been performed.  Nolon RodBrown, Tony 05/02/2017, 1:16 PM

## 2017-05-02 NOTE — Progress Notes (Signed)
PROGRESS NOTE    Johnny [name]  MVH:[id number] DOB: 1933/02/11 DOA: 04/24/2017 PCP: Darci Needle, MD   Chief Complaint  Patient presents with  . N/V    Brief Narrative:  HPI on 04/25/2017 by Dr. Pearson Grippe Johnny [name]  is a 83 y.o. male, w hypertension, hyperlipidemia, Dm2,  CVA, macular degeneration, glaucoma apparently c/o vertigo ,  Denies hearing loss or tinnitus.  No family history.  No recent illness.   In ED. Wbc 15.9, Hgb 18.1, Plt 200 Bun 25, Creatinine 2.00 CT brain negative.  Pt will be admitted for new onset vertigo, n/v.    Assessment & Plan   Acute CVA -CT head showed no acute abnormality -MRI brain showed scattered punctate infarcts and cerebral hemispheres and larger 10 mm focus in right cerebellum around old right cerebellum infarct -MRA: Nonvisualized right posterior-inferior cerebellar artery seen with artifact, low flow or occlusion. Moderate stenosis of anterior posterior circulation compatible with atherosclerosis. 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 Bilateral 1-39% ICA stenosis. Vertebral artery flow is antegrade -Echocardiogram: EF 55-60%, grade 1 diastolic dysfunction, moderate LVH, status post aVR with mean gradient of 15 mmHg -Hemoglobin A1c 7, LDL 113 -Neurology consulted and appreciated -PT/OT recommended SNF -Continue aspirin 325 mg - atorvastatin: Patient states he had been receiving injections up until one year ago for his cholesterol. -s/p TEE: No evidence for vegetation or an intracardiac thrombus. Positive bubble study. -loop on hold until fever/bacteremia  Leukocytosis -increasing -recheck in AM -on abx for pseudomonas  Essential hypertension -treat as needed  Fever due to pseudomonas bacteremia -urine culture pending but U/A suggestive of infection -check x ray normal -blood cultures-- pseudomonas -- repeat now that patient has ben on abx for 48  hours -flu swab negative  Diabetes mellitus, type II -Continue Lantus, insulin sliding scale, CBG monitoring -Hemoglobin A1c 7  Chronic kidney disease, stage III -Creatinine baseline, continue monitor BMP  DVT Prophylaxis  SCDs  Code Status: Full  Family Communication: None at bedside  Disposition Plan: SNF when work up complete  Consultants Neurology Cards (TEE)  Antibiotics   Anti-infectives    Start     Dose/Rate Route Frequency Ordered Stop   05/01/17 1100  ceFEPIme (MAXIPIME) 2 g in dextrose 5 % 50 mL IVPB     2 g 100 mL/hr over 30 Minutes Intravenous Every 24 hours 05/01/17 1032        Subjective:   No fevers, no abdominal pain, bowels moving  Objective:   Vitals:   05/02/17 1300 05/02/17 1310 05/02/17 1320 05/02/17 1400  BP: 124/63 (!) 134/58 137/76 (!) 150/66  Pulse: 80 77 77 75  Resp: 18 20 20 18   Temp:    98.1 F (36.7 C)  TempSrc:    Oral  SpO2:  93% 93% 95%  Weight:      Height:        Intake/Output Summary (Last 24 hours) at 05/02/17 1533 Last data filed at 05/02/17 0124  Gross per 24 hour  Intake               30 ml  Output                0 ml  Net               30 ml   Filed Weights   04/29/17 0455 04/30/17 0100 05/01/17 0600  Weight: 100.5 kg (221 lb 8 oz) 100.3  kg (221 lb 1.6 oz) 96.4 kg (212 lb 8 oz)    Exam  General: on bedside commode-- NPO for TEE  Cardiovascular: rrr  Respiratory: diminished, no wheezing  Abdomen: +BS, soft  Psych: mood appropriate   Data Reviewed: I have personally reviewed following labs and imaging studies  CBC:  Recent Labs Lab 04/26/17 0623 05/02/17 0312  WBC 10.3 31.9*  HGB 15.3 14.3  HCT 44.5 40.1  MCV 84.6 84.6  PLT 178 193   Basic Metabolic Panel:  Recent Labs Lab 04/26/17 0623 05/02/17 0312  NA 138 132*  K 3.9 4.0  CL 104 101  CO2 25 21*  GLUCOSE 110* 254*  BUN 25* 41*  CREATININE 2.25* 2.76*  CALCIUM 9.4 9.2   GFR: Estimated Creatinine Clearance: 23.2 mL/min (A)  (by C-G formula based on SCr of 2.76 mg/dL (H)). Liver Function Tests:  Recent Labs Lab 04/26/17 0623  AST 25  ALT 26  ALKPHOS 72  BILITOT 0.5  PROT 6.5  ALBUMIN 3.5   No results for input(s): LIPASE, AMYLASE in the last 168 hours. No results for input(s): AMMONIA in the last 168 hours. Coagulation Profile: No results for input(s): INR, PROTIME in the last 168 hours. Cardiac Enzymes: No results for input(s): CKTOTAL, CKMB, CKMBINDEX, TROPONINI in the last 168 hours. BNP (last 3 results) No results for input(s): PROBNP in the last 8760 hours. HbA1C: No results for input(s): HGBA1C in the last 72 hours. CBG:  Recent Labs Lab 05/01/17 1646 05/01/17 2125 05/02/17 0631 05/02/17 1137 05/02/17 1352  GLUCAP 291* 287* 237* 242* 222*   Lipid Profile: No results for input(s): CHOL, HDL, LDLCALC, TRIG, CHOLHDL, LDLDIRECT in the last 72 hours. Thyroid Function Tests: No results for input(s): TSH, T4TOTAL, FREET4, T3FREE, THYROIDAB in the last 72 hours. Anemia Panel: No results for input(s): VITAMINB12, FOLATE, FERRITIN, TIBC, IRON, RETICCTPCT in the last 72 hours. Urine analysis:    Component Value Date/Time   COLORURINE YELLOW 04/30/2017 1731   APPEARANCEUR CLOUDY (A) 04/30/2017 1731   LABSPEC 1.016 04/30/2017 1731   PHURINE 5.0 04/30/2017 1731   GLUCOSEU 50 (A) 04/30/2017 1731   HGBUR SMALL (A) 04/30/2017 1731   BILIRUBINUR NEGATIVE 04/30/2017 1731   KETONESUR NEGATIVE 04/30/2017 1731   PROTEINUR 100 (A) 04/30/2017 1731   UROBILINOGEN 0.2 01/02/2014 2236   NITRITE NEGATIVE 04/30/2017 1731   LEUKOCYTESUR LARGE (A) 04/30/2017 1731    ) Recent Results (from the past 240 hour(s))  Culture, blood (Routine X 2) w Reflex to ID Panel     Status: Abnormal (Preliminary result)   Collection Time: 04/30/17 12:02 PM  Result Value Ref Range Status   Specimen Description BLOOD LEFT ANTECUBITAL  Final   Special Requests   Final    BOTTLES DRAWN AEROBIC ONLY Blood Culture adequate  volume   Culture  Setup Time   Final    GRAM NEGATIVE RODS AEROBIC BOTTLE ONLY CRITICAL RESULT CALLED TO, READ BACK BY AND VERIFIED WITH: A MASTERS,PHARMD AT 1011 05/01/17 BY L BENFIELD    Culture (A)  Final    PSEUDOMONAS AERUGINOSA SUSCEPTIBILITIES TO FOLLOW    Report Status PENDING  Incomplete  Blood Culture ID Panel (Reflexed)     Status: Abnormal   Collection Time: 04/30/17 12:02 PM  Result Value Ref Range Status   Enterococcus species NOT DETECTED NOT DETECTED Final   Listeria monocytogenes NOT DETECTED NOT DETECTED Final   Staphylococcus species NOT DETECTED NOT DETECTED Final   Staphylococcus aureus NOT DETECTED NOT DETECTED  Final   Streptococcus species NOT DETECTED NOT DETECTED Final   Streptococcus agalactiae NOT DETECTED NOT DETECTED Final   Streptococcus pneumoniae NOT DETECTED NOT DETECTED Final   Streptococcus pyogenes NOT DETECTED NOT DETECTED Final   Acinetobacter baumannii NOT DETECTED NOT DETECTED Final   Enterobacteriaceae species NOT DETECTED NOT DETECTED Final   Enterobacter cloacae complex NOT DETECTED NOT DETECTED Final   Escherichia coli NOT DETECTED NOT DETECTED Final   Klebsiella oxytoca NOT DETECTED NOT DETECTED Final   Klebsiella pneumoniae NOT DETECTED NOT DETECTED Final   Proteus species NOT DETECTED NOT DETECTED Final   Serratia marcescens NOT DETECTED NOT DETECTED Final   Carbapenem resistance NOT DETECTED NOT DETECTED Final   Haemophilus influenzae NOT DETECTED NOT DETECTED Final   Neisseria meningitidis NOT DETECTED NOT DETECTED Final   Pseudomonas aeruginosa DETECTED (A) NOT DETECTED Final    Comment: CRITICAL RESULT CALLED TO, READ BACK BY AND VERIFIED WITH: A MASTERS,PHARMD AT 1011 05/01/17 BY L BENFIELD    Candida albicans NOT DETECTED NOT DETECTED Final   Candida glabrata NOT DETECTED NOT DETECTED Final   Candida krusei NOT DETECTED NOT DETECTED Final   Candida parapsilosis NOT DETECTED NOT DETECTED Final   Candida tropicalis NOT  DETECTED NOT DETECTED Final  Culture, blood (Routine X 2) w Reflex to ID Panel     Status: None (Preliminary result)   Collection Time: 04/30/17 12:07 PM  Result Value Ref Range Status   Specimen Description BLOOD RIGHT ANTECUBITAL  Final   Special Requests IN PEDIATRIC BOTTLE Blood Culture adequate volume  Final   Culture NO GROWTH 2 DAYS  Final   Report Status PENDING  Incomplete  Culture, Urine     Status: Abnormal (Preliminary result)   Collection Time: 05/01/17 12:16 PM  Result Value Ref Range Status   Specimen Description URINE, CLEAN CATCH  Final   Special Requests NONE  Final   Culture >=100,000 COLONIES/mL GRAM NEGATIVE RODS (A)  Final   Report Status PENDING  Incomplete      Radiology Studies: No results found.   Scheduled Meds: . allopurinol  200 mg Oral QHS  . aspirin  300 mg Rectal Daily   Or  . aspirin  325 mg Oral Daily  . atorvastatin  20 mg Oral q1800  . docusate sodium  100 mg Oral BID  . ferrous sulfate  325 mg Oral BID  . folic acid  1 mg Oral Daily  . furosemide  40 mg Oral Daily  . hydrALAZINE  50 mg Oral TID  . insulin aspart  0-5 Units Subcutaneous QHS  . insulin aspart  0-9 Units Subcutaneous TID WC  . insulin glargine  30 Units Subcutaneous BID  . latanoprost  1 drop Both Eyes QHS  . metoprolol tartrate  50 mg Oral BID  . pantoprazole  40 mg Oral Daily  . senna  1 tablet Oral QHS  . sodium chloride flush  3 mL Intravenous Q12H   Continuous Infusions: . sodium chloride    . sodium chloride    . ceFEPime (MAXIPIME) IV 2 g (05/02/17 1338)     LOS: 5 days   Time Spent in minutes   30 minutes  Lahoma Constantin U Jun Osment D.O. on 05/02/2017 at 3:33 PM   [facility] Office  [phone]

## 2017-05-02 NOTE — CV Procedure (Signed)
     Transesophageal Echocardiogram Note  Johnny [name] [id number] Mar 15, 1933  Procedure: Transesophageal Echocardiogram Indications: stroke  Procedure Details Consent: Obtained Time Out: Verified patient identification, verified procedure, site/side was marked, verified correct patient position, special equipment/implants available, Radiology Safety Procedures followed,  medications/allergies/relevent history reviewed, required imaging and test results available.  Performed  Medications: During this procedure the patient is administered a total of Versed 4 mg and Fentanyl 50 mcg to achieve and maintain moderate conscious sedation.  The patient's heart rate, blood pressure, and oxygen saturation are monitored continuously during the procedure. The period of conscious sedation is 30 minutes, of which I was present face-to-face 100% of this time.  No evidence for vegetation or an intracardiac thrombus. Positive bubble study. See TEE report fr full dictation.   Complications: No apparent complications Patient did tolerate procedure well.  Johnny AlexanderKatarina Rinda Rollyson, MD, [facility] Center[facility] 05/02/2017, 1:01 PM

## 2017-05-02 NOTE — Progress Notes (Signed)
Consent signed for TEE

## 2017-05-02 NOTE — Interval H&P Note (Signed)
History and Physical Interval Note:  05/02/2017 11:29 AM  Johnny [name]  has presented today for surgery, with the diagnosis of STROKE  The various methods of treatment have been discussed with the patient and family. After consideration of risks, benefits and other options for treatment, the patient has consented to  Procedure(s): TRANSESOPHAGEAL ECHOCARDIOGRAM (TEE) (N/A) as a surgical intervention .  The patient's history has been reviewed, patient examined, no change in status, stable for surgery.  I have reviewed the patient's chart and labs.  Questions were answered to the patient's satisfaction.     Johnny [name]

## 2017-05-03 ENCOUNTER — Encounter (HOSPITAL_COMMUNITY): Payer: Self-pay | Admitting: Cardiology

## 2017-05-03 LAB — CULTURE, BLOOD (ROUTINE X 2): SPECIAL REQUESTS: ADEQUATE

## 2017-05-03 LAB — CBC
HCT: 44.1 % (ref 39.0–52.0)
Hemoglobin: 15.5 g/dL (ref 13.0–17.0)
MCH: 30 pg (ref 26.0–34.0)
MCHC: 35.1 g/dL (ref 30.0–36.0)
MCV: 85.3 fL (ref 78.0–100.0)
PLATELETS: 207 10*3/uL (ref 150–400)
RBC: 5.17 MIL/uL (ref 4.22–5.81)
RDW: 14.3 % (ref 11.5–15.5)
WBC: 20 10*3/uL — AB (ref 4.0–10.5)

## 2017-05-03 LAB — BASIC METABOLIC PANEL
ANION GAP: 8 (ref 5–15)
BUN: 40 mg/dL — AB (ref 6–20)
CALCIUM: 9.4 mg/dL (ref 8.9–10.3)
CO2: 22 mmol/L (ref 22–32)
Chloride: 103 mmol/L (ref 101–111)
Creatinine, Ser: 2.36 mg/dL — ABNORMAL HIGH (ref 0.61–1.24)
GFR calc Af Amer: 28 mL/min — ABNORMAL LOW (ref >60)
GFR, EST NON AFRICAN AMERICAN: 24 mL/min — AB (ref >60)
GLUCOSE: 190 mg/dL — AB (ref 65–99)
Potassium: 4.2 mmol/L (ref 3.5–5.1)
SODIUM: 133 mmol/L — AB (ref 135–145)

## 2017-05-03 LAB — GLUCOSE, CAPILLARY
GLUCOSE-CAPILLARY: 139 mg/dL — AB (ref 65–99)
Glucose-Capillary: 196 mg/dL — ABNORMAL HIGH (ref 65–99)
Glucose-Capillary: 216 mg/dL — ABNORMAL HIGH (ref 65–99)
Glucose-Capillary: 237 mg/dL — ABNORMAL HIGH (ref 65–99)

## 2017-05-03 NOTE — Progress Notes (Signed)
Occupational Therapy Treatment Patient Details Name: Johnny [name] MRN: [id number] DOB: 09/01/1932 Today's Date: 05/03/2017    History of present illness Johnny [name]  is a 83 y.o. male, w hypertension, hyperlipidemia, Dm2,  CVA, macular degeneration, glaucoma apparently c/o vertigo; CT head neg, MRI obtained on 04/26/17 revealed scattered punctate foci of acute ischemia within both cerebral hemispheres and the right cerebellum; largest focus was in the medial right cerebellum, Also seen was an old right cerebellar infarct and findings of chronic microvascular ischemia; [facility]   OT comments  Pt making excellent progress, requiring min A for mobility during ADL @ RW level. Pt very motivated to return to PLOF. Continue to recommend SNF for rehab. Will continue o follow acutely to maximize functional level of independence.   Follow Up Recommendations  SNF;Other (comment)    Equipment Recommendations  Other (comment) (TBA at SNF)    Recommendations for Other Services      Precautions / Restrictions Precautions Precautions: Fall Precaution Comments: legally blind       Mobility Bed Mobility               General bed mobility comments: Pt OOB in chair  Transfers Overall transfer level: Needs assistance Equipment used: Rolling walker (2 wheeled) Transfers: Sit to/from Stand Sit to Stand: Min assist              Balance     Sitting balance-Leahy Scale: Fair       Standing balance-Leahy Scale: Poor                             ADL either performed or assessed with clinical judgement   ADL Overall ADL's : Needs assistance/impaired     Grooming: Supervision/safety;Set up;Sitting                   Toilet Transfer: Minimal assistance;RW   Toileting- Clothing Manipulation and Hygiene: Minimal assistance;Sit to/from stand       Functional mobility during ADLs: Minimal assistance;Rolling  walker General ADL Comments: Able to power up to stand with min A and complete pericare with min A for balance (using R hand)     Vision   Additional Comments: legally blind   Perception     Praxis      Cognition Arousal/Alertness: Awake/alert Behavior During Therapy: WFL for tasks assessed/performed Overall Cognitive Status: Within Functional Limits for tasks assessed                                          Exercises Other Exercises Other Exercises: encouraged BUE general AROM exercises when sitting in chair   Shoulder Instructions       General Comments      Pertinent Vitals/ Pain       Pain Assessment: No/denies pain  Home Living                                          Prior Functioning/Environment              Frequency  Min 2X/week        Progress Toward Goals  OT Goals(current goals can now be found in the care plan  section)  Progress towards OT goals: Progressing toward goals  Acute Rehab OT Goals Patient Stated Goal: return to walking and prior level of function OT Goal Formulation: With patient Time For Goal Achievement: 05/12/17 Potential to Achieve Goals: Good ADL Goals Pt Will Perform Grooming: sitting;with set-up Pt Will Perform Upper Body Bathing: with set-up;sitting Pt Will Perform Lower Body Bathing: sit to/from stand;with min assist Pt Will Perform Lower Body Dressing: with min assist;sit to/from stand Pt Will Transfer to Toilet: with min assist;ambulating;bedside commode Pt Will Perform Toileting - Clothing Manipulation and hygiene: with min assist;sit to/from stand  Plan Discharge plan remains appropriate    Co-evaluation                 AM-PAC PT "6 Clicks" Daily Activity     Outcome Measure   Help from another person eating meals?: A Little Help from another person taking care of personal grooming?: A Little Help from another person toileting, which includes using toliet, bedpan,  or urinal?: A Lot Help from another person bathing (including washing, rinsing, drying)?: A Lot Help from another person to put on and taking off regular upper body clothing?: A Little Help from another person to put on and taking off regular lower body clothing?: A Little 6 Click Score: 16    End of Session Equipment Utilized During Treatment: Gait belt;Rolling walker  OT Visit Diagnosis: Unsteadiness on feet (R26.81);Other abnormalities of gait and mobility (R26.89);Muscle weakness (generalized) (M62.81);Other symptoms and signs involving the nervous system (R29.898)   Activity Tolerance Patient tolerated treatment well   Patient Left in chair;with call bell/phone within reach;with family/visitor present (gettin gready to work with PT)   Nurse Communication Mobility status        Time: 1610-9604 OT Time Calculation (min): 18 min  Charges: OT General Charges $OT Visit: 1 Visit OT Treatments $Self Care/Home Management : 8-22 mins  [facility], OT/L  [phone] 05/03/2017   Kaylise Blakeley,HILLARY 05/03/2017, 4:04 PM

## 2017-05-03 NOTE — Progress Notes (Signed)
Physical Therapy Treatment Patient Details Name: Johnny [name] MRN: [id number] DOB: 1932-12-23 Today's Date: 05/03/2017    History of Present Illness Johnny [name]  is a 83 y.o. male, w hypertension, hyperlipidemia, Dm2,  CVA, macular degeneration, glaucoma apparently c/o vertigo; CT head neg, MRI obtained on 04/26/17 revealed scattered punctate foci of acute ischemia within both cerebral hemispheres and the right cerebellum; largest focus was in the medial right cerebellum, Also seen was an old right cerebellar infarct and findings of chronic microvascular ischemia; Pt transferred from [place] to [place] on 10/17    PT Comments    Pt was eager and ready to walk.  Pt wanted to walk much further than yesterday and did so successfully.  Pt was fatigued upon return to room.  PT to follow acutely for deficits listed below.    Follow Up Recommendations  SNF     Equipment Recommendations  None recommended by PT    Recommendations for Other Services       Precautions / Restrictions Precautions Precautions: Fall Precaution Comments: legally blind Restrictions Weight Bearing Restrictions: No    Mobility  Bed Mobility               General bed mobility comments: pt. in chair upon arival  Transfers Overall transfer level: Needs assistance Equipment used: Rolling walker (2 wheeled) Transfers: Sit to/from Stand Sit to Stand: Min assist         General transfer comment: pt need min assist to come to a full stand.  Pt neded verbal cues for hand placement (not on RW).  Ambulation/Gait Ambulation/Gait assistance: Min guard Ambulation Distance (Feet): 150 Feet Assistive device: Rolling walker (2 wheeled) Gait Pattern/deviations: Step-through pattern;Step-to pattern;Decreased stride length;Drifts right/left;Trunk flexed Gait velocity: decreased   General Gait Details: pt needs VCs for direction d/t visual deficits. Slow and unsteady.   Stairs             Wheelchair Mobility    Modified Rankin (Stroke Patients Only) Modified Rankin (Stroke Patients Only) Pre-Morbid Rankin Score: Slight disability Modified Rankin: Moderately severe disability     Balance Overall balance assessment: Needs assistance Sitting-balance support: Feet supported;No upper extremity supported Sitting balance-Leahy Scale: Fair     Standing balance support: Bilateral upper extremity supported;During functional activity Standing balance-Leahy Scale: Poor Standing balance comment: patient reliant on UE support from RW at all times                            Cognition Arousal/Alertness: Awake/alert Behavior During Therapy: WFL for tasks assessed/performed Overall Cognitive Status: Within Functional Limits for tasks assessed                                        Exercises Other Exercises Other Exercises: encouraged BUE general AROM exercises when sitting in chair    General Comments        Pertinent Vitals/Pain Pain Assessment: No/denies pain    Home Living                      Prior Function            PT Goals (current goals can now be found in the care plan section) Acute Rehab PT Goals Patient Stated Goal: return to walking and prior level of function PT Goal Formulation: With patient/family Potential to  Achieve Goals: Good Progress towards PT goals: Progressing toward goals    Frequency    Min 3X/week      PT Plan Current plan remains appropriate    Co-evaluation              AM-PAC PT "6 Clicks" Daily Activity  Outcome Measure  Difficulty turning over in bed (including adjusting bedclothes, sheets and blankets)?: A Little Difficulty moving from lying on back to sitting on the side of the bed? : A Little Difficulty sitting down on and standing up from a chair with arms (e.g., wheelchair, bedside commode, etc,.)?: Unable Help needed moving to and from a bed to chair (including a  wheelchair)?: A Lot Help needed walking in hospital room?: A Lot Help needed climbing 3-5 steps with a railing? : Total 6 Click Score: 12    End of Session Equipment Utilized During Treatment: Gait belt Activity Tolerance: Patient tolerated treatment well Patient left: in chair;with chair alarm set;with call bell/phone within reach;with family/visitor present   PT Visit Diagnosis: Other abnormalities of gait and mobility (R26.89)     Time: [id number] PT Time Calculation (min) (ACUTE ONLY): 42 min  Charges:  $Gait Training: 8-22 mins                    G CodesNeomia [name]:       Johnny [name], SPT [phone] office   Johnny [name] 05/03/2017, 4:33 PM

## 2017-05-03 NOTE — Progress Notes (Signed)
PROGRESS NOTE    Johnny [name]  ZOX:[id number] DOB: 1933/01/27 DOA: 04/24/2017 PCP: Darci Needle, MD   Chief Complaint  Patient presents with  . N/V    Brief Narrative:  HPI on 04/25/2017 by Dr. Pearson Grippe Demontrez [name]  is a 83 y.o. male, w hypertension, hyperlipidemia, Dm2,  CVA, macular degeneration, glaucoma apparently c/o vertigo ,  Denies hearing loss or tinnitus.  No family history.  No recent illness.   In ED. Wbc 15.9, Hgb 18.1, Plt 200 Bun 25, Creatinine 2.00 CT brain negative.  Pt will be admitted for new onset vertigo, n/v.    Assessment & Plan   Acute CVA -CT head showed no acute abnormality -MRI brain showed scattered punctate infarcts and cerebral hemispheres and larger 10 mm focus in right cerebellum around old right cerebellum infarct -MRA: Nonvisualized right posterior-inferior cerebellar artery seen with artifact, low flow or occlusion. Moderate stenosis of anterior posterior circulation compatible with atherosclerosis. 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 Bilateral 1-39% ICA stenosis. Vertebral artery flow is antegrade -Echocardiogram: EF 55-60%, grade 1 diastolic dysfunction, moderate LVH, status post aVR with mean gradient of 15 mmHg -Hemoglobin A1c 7, LDL 113 -Neurology consulted and appreciated -PT/OT recommended SNF -Continue aspirin 325 mg - atorvastatin: Patient states he had been receiving injections up until one year ago for his cholesterol. -s/p TEE: No evidence for vegetation or an intracardiac thrombus. Positive bubble study. -loop on hold until fever/bacteremia  Leukocytosis -trend -on abx for pseudomonas  Essential hypertension -treat as needed  Fever due to pseudomonas bacteremia/UTI -urine culture: gram negative rods -check x ray normal -blood cultures-- pseudomonas -- repeat now that patient has ben on abx for 48 hours -flu swab negative  Diabetes  mellitus, type II -Continue Lantus, insulin sliding scale, CBG monitoring -Hemoglobin A1c 7  Chronic kidney disease, stage III -Creatinine baseline, continue monitor BMP  DVT Prophylaxis  SCDs  Code Status: Full  Family Communication: None at bedside  Disposition Plan: SNF when work up complete  Consultants Neurology Cards (TEE)  Antibiotics   Anti-infectives    Start     Dose/Rate Route Frequency Ordered Stop   05/01/17 1100  ceFEPIme (MAXIPIME) 2 g in dextrose 5 % 50 mL IVPB     2 g 100 mL/hr over 30 Minutes Intravenous Every 24 hours 05/01/17 1032        Subjective:   No complaints this AM  Objective:   Vitals:   05/03/17 0500 05/03/17 0540 05/03/17 1036 05/03/17 1423  BP:  (!) 157/78 104/68 116/66  Pulse:  68 82 66  Resp:  18 19 20   Temp:  98.2 F (36.8 C) (!) 97.3 F (36.3 C) 98.5 F (36.9 C)  TempSrc:  Oral Oral Oral  SpO2:  100% 94% 95%  Weight: 95.4 kg (210 lb 6.4 oz)     Height:        Intake/Output Summary (Last 24 hours) at 05/03/17 1530 Last data filed at 05/03/17 0540  Gross per 24 hour  Intake           891.25 ml  Output             1700 ml  Net          -808.75 ml   Filed Weights   04/30/17 0100 05/01/17 0600 05/03/17 0500  Weight: 100.3 kg (221 lb 1.6 oz) 96.4 kg (212 lb 8 oz) 95.4 kg (210 lb 6.4  oz)    Exam  General: NAD- eating breakfast  Cardiovascular: rrr  Respiratory: no wheezing  Abdomen: +BS, soft    Data Reviewed: I have personally reviewed following labs and imaging studies  CBC:  Recent Labs Lab 05/02/17 0312 05/03/17 0015  WBC 31.9* 20.0*  HGB 14.3 15.5  HCT 40.1 44.1  MCV 84.6 85.3  PLT 193 207   Basic Metabolic Panel:  Recent Labs Lab 05/02/17 0312 05/03/17 0015  NA 132* 133*  K 4.0 4.2  CL 101 103  CO2 21* 22  GLUCOSE 254* 190*  BUN 41* 40*  CREATININE 2.76* 2.36*  CALCIUM 9.2 9.4   GFR: Estimated Creatinine Clearance: 27 mL/min (A) (by C-G formula based on SCr of 2.36 mg/dL  (H)). Liver Function Tests: No results for input(s): AST, ALT, ALKPHOS, BILITOT, PROT, ALBUMIN in the last 168 hours. No results for input(s): LIPASE, AMYLASE in the last 168 hours. No results for input(s): AMMONIA in the last 168 hours. Coagulation Profile: No results for input(s): INR, PROTIME in the last 168 hours. Cardiac Enzymes: No results for input(s): CKTOTAL, CKMB, CKMBINDEX, TROPONINI in the last 168 hours. BNP (last 3 results) No results for input(s): PROBNP in the last 8760 hours. HbA1C: No results for input(s): HGBA1C in the last 72 hours. CBG:  Recent Labs Lab 05/02/17 1352 05/02/17 1652 05/02/17 2141 05/03/17 0610 05/03/17 1137  GLUCAP 222* 222* 199* 139* 196*   Lipid Profile: No results for input(s): CHOL, HDL, LDLCALC, TRIG, CHOLHDL, LDLDIRECT in the last 72 hours. Thyroid Function Tests: No results for input(s): TSH, T4TOTAL, FREET4, T3FREE, THYROIDAB in the last 72 hours. Anemia Panel: No results for input(s): VITAMINB12, FOLATE, FERRITIN, TIBC, IRON, RETICCTPCT in the last 72 hours. Urine analysis:    Component Value Date/Time   COLORURINE YELLOW 04/30/2017 1731   APPEARANCEUR CLOUDY (A) 04/30/2017 1731   LABSPEC 1.016 04/30/2017 1731   PHURINE 5.0 04/30/2017 1731   GLUCOSEU 50 (A) 04/30/2017 1731   HGBUR SMALL (A) 04/30/2017 1731   BILIRUBINUR NEGATIVE 04/30/2017 1731   KETONESUR NEGATIVE 04/30/2017 1731   PROTEINUR 100 (A) 04/30/2017 1731   UROBILINOGEN 0.2 01/02/2014 2236   NITRITE NEGATIVE 04/30/2017 1731   LEUKOCYTESUR LARGE (A) 04/30/2017 1731    ) Recent Results (from the past 240 hour(s))  Culture, blood (Routine X 2) w Reflex to ID Panel     Status: Abnormal   Collection Time: 04/30/17 12:02 PM  Result Value Ref Range Status   Specimen Description BLOOD LEFT ANTECUBITAL  Final   Special Requests   Final    BOTTLES DRAWN AEROBIC ONLY Blood Culture adequate volume   Culture  Setup Time   Final    GRAM NEGATIVE RODS AEROBIC BOTTLE  ONLY CRITICAL RESULT CALLED TO, READ BACK BY AND VERIFIED WITH: A MASTERS,PHARMD AT 1011 05/01/17 BY L BENFIELD    Culture PSEUDOMONAS AERUGINOSA (A)  Final   Report Status 05/03/2017 FINAL  Final   Organism ID, Bacteria PSEUDOMONAS AERUGINOSA  Final      Susceptibility   Pseudomonas aeruginosa - MIC*    CEFTAZIDIME 2 SENSITIVE Sensitive     CIPROFLOXACIN <=0.25 SENSITIVE Sensitive     GENTAMICIN <=1 SENSITIVE Sensitive     IMIPENEM 2 SENSITIVE Sensitive     PIP/TAZO 8 SENSITIVE Sensitive     CEFEPIME <=1 SENSITIVE Sensitive     * PSEUDOMONAS AERUGINOSA  Blood Culture ID Panel (Reflexed)     Status: Abnormal   Collection Time: 04/30/17 12:02 PM  Result  Value Ref Range Status   Enterococcus species NOT DETECTED NOT DETECTED Final   Listeria monocytogenes NOT DETECTED NOT DETECTED Final   Staphylococcus species NOT DETECTED NOT DETECTED Final   Staphylococcus aureus NOT DETECTED NOT DETECTED Final   Streptococcus species NOT DETECTED NOT DETECTED Final   Streptococcus agalactiae NOT DETECTED NOT DETECTED Final   Streptococcus pneumoniae NOT DETECTED NOT DETECTED Final   Streptococcus pyogenes NOT DETECTED NOT DETECTED Final   Acinetobacter baumannii NOT DETECTED NOT DETECTED Final   Enterobacteriaceae species NOT DETECTED NOT DETECTED Final   Enterobacter cloacae complex NOT DETECTED NOT DETECTED Final   Escherichia coli NOT DETECTED NOT DETECTED Final   Klebsiella oxytoca NOT DETECTED NOT DETECTED Final   Klebsiella pneumoniae NOT DETECTED NOT DETECTED Final   Proteus species NOT DETECTED NOT DETECTED Final   Serratia marcescens NOT DETECTED NOT DETECTED Final   Carbapenem resistance NOT DETECTED NOT DETECTED Final   Haemophilus influenzae NOT DETECTED NOT DETECTED Final   Neisseria meningitidis NOT DETECTED NOT DETECTED Final   Pseudomonas aeruginosa DETECTED (A) NOT DETECTED Final    Comment: CRITICAL RESULT CALLED TO, READ BACK BY AND VERIFIED WITH: A MASTERS,PHARMD AT 1011  05/01/17 BY L BENFIELD    Candida albicans NOT DETECTED NOT DETECTED Final   Candida glabrata NOT DETECTED NOT DETECTED Final   Candida krusei NOT DETECTED NOT DETECTED Final   Candida parapsilosis NOT DETECTED NOT DETECTED Final   Candida tropicalis NOT DETECTED NOT DETECTED Final  Culture, blood (Routine X 2) w Reflex to ID Panel     Status: None (Preliminary result)   Collection Time: 04/30/17 12:07 PM  Result Value Ref Range Status   Specimen Description BLOOD RIGHT ANTECUBITAL  Final   Special Requests IN PEDIATRIC BOTTLE Blood Culture adequate volume  Final   Culture NO GROWTH 3 DAYS  Final   Report Status PENDING  Incomplete  Culture, Urine     Status: Abnormal (Preliminary result)   Collection Time: 05/01/17 12:16 PM  Result Value Ref Range Status   Specimen Description URINE, CLEAN CATCH  Final   Special Requests NONE  Final   Culture (A)  Final    >=100,000 COLONIES/mL PSEUDOMONAS AERUGINOSA >=100,000 COLONIES/mL KLEBSIELLA PNEUMONIAE SUSCEPTIBILITIES TO FOLLOW    Report Status PENDING  Incomplete      Radiology Studies: US Renal  Result Date: 05/02/2017 CLINICAL DATA:  83 year old male with UTI. History of bilateral renal cysts. EXAM: RENAL / URINARY TRACT ULTRASOUND COMPLETE COMPARISON:  CT of the abdomen pelvis dated 09/06/2010 FINDINGS: Right Kidney: Length: 11.3 cm. There multiple cysts in the right kidney. The largest cyst is in the superior pole and measures 5.5 x 5.4 x 6.3 cm. This cyst appears to have an area of peripheral calcification with an apparent partially calcified septum. This likely corresponds to the cyst seen on the CT of 2012. Evaluation of the cyst is limited due to technique. Nonemergent MRI may provide better characterization if clinically indicated. There is no hydronephrosis or echogenic stone. Left Kidney: Length: 11.7 cm. There is a 1.1 x 1.1 x 1.5 cm left renal interpolar cyst. No hydronephrosis or echogenic stone. Bladder: The urinary bladder  is collapsed and not visualized. IMPRESSION: 1. No hydronephrosis or echogenic stone. 2. Probable bilateral renal cysts. Electronically Signed   By: Elgie Collard M.D.   On: 05/02/2017 22:43     Scheduled Meds: . allopurinol  200 mg Oral QHS  . aspirin  300 mg Rectal Daily   Or  .  aspirin  325 mg Oral Daily  . atorvastatin  20 mg Oral q1800  . docusate sodium  100 mg Oral BID  . ferrous sulfate  325 mg Oral BID  . folic acid  1 mg Oral Daily  . furosemide  40 mg Oral Daily  . hydrALAZINE  50 mg Oral TID  . insulin aspart  0-5 Units Subcutaneous QHS  . insulin aspart  0-9 Units Subcutaneous TID WC  . insulin glargine  35 Units Subcutaneous BID  . latanoprost  1 drop Both Eyes QHS  . metoprolol tartrate  50 mg Oral BID  . pantoprazole  40 mg Oral Daily  . senna  1 tablet Oral QHS  . sodium chloride flush  3 mL Intravenous Q12H   Continuous Infusions: . sodium chloride    . sodium chloride 75 mL/hr at 05/03/17 0524  . ceFEPime (MAXIPIME) IV Stopped (05/03/17 1108)     LOS: 6 days   Time Spent in minutes   30 minutes  Prajwal Fellner U Thedora Rings D.O. on 05/03/2017 at 3:30 PM   [facility] Office  [phone]

## 2017-05-03 NOTE — Care Management Note (Signed)
Case Management Note  Patient Details  Name: Johnny [name] MRN: [id number] Date of Birth: 07-28-1932  Subjective/Objective:                    Action/Plan: CM met with the patient and his wife late yesterday to see which SNF rehab would be their preference. They selected [facility] CSW updated. CM following.   Expected Discharge Date:  04/27/17               Expected Discharge Plan:  Skilled Nursing Facility  In-House Referral:  Clinical Social Work  Discharge planning Services  CM Consult  Post Acute Care Choice:    Choice offered to:     DME Arranged:    DME Agency:     HH Arranged:    [place] Agency:     Status of Service:  In process, will continue to follow  If discussed at Long Length of Stay Meetings, dates discussed:    Additional Comments:  Pollie Friar, RN 05/03/2017, 12:19 PM

## 2017-05-04 LAB — GLUCOSE, CAPILLARY
GLUCOSE-CAPILLARY: 138 mg/dL — AB (ref 65–99)
Glucose-Capillary: 98 mg/dL (ref 65–99)
Glucose-Capillary: 132 mg/dL — ABNORMAL HIGH (ref 65–99)
Glucose-Capillary: 188 mg/dL — ABNORMAL HIGH (ref 65–99)

## 2017-05-04 LAB — URINE CULTURE: Culture: >=100000 — AB

## 2017-05-04 NOTE — Progress Notes (Signed)
CSW following to facilitate discharge planning. CSW confirmed that patient's choice facility is [facility], and has confirmed with [facility] that [facility] could admit. Per MD, patient not medically ready at this time; facility is aware. CSW will continue to follow.  Blenda NicelyElizabeth Shayden Gingrich, [place] Clinical Social Worker [phone]

## 2017-05-04 NOTE — Progress Notes (Signed)
Pharmacy Antibiotic Note  Johnny [name] is a 83 y.o. male admitted on 04/24/2017 with ith N/V, c/o vertigo. Pharmacy has been consulted on 05/01/17 for Cefepime dosing for  pseudomonas bacteremia/ UTI.    Day#4 Cefepime:  WBC 31.9>20k , AFebrike, Tm 99.3 last 24hr,  SCr 2.36, stable,  h/o CKD stage 3., estimated CrCl ~ 27 ml/min. Pseudomonas in blood culture and urine culture  sensitive to Cipro. Also Klebsiella pneumoniae in Urine cx sensive to Cipro.  Consider transition abx to oral Cipro.   Plan: Continue Cefepime 2g IV q24h Monitor renal fx, cultures and sensitivities Duration of therapy Consider changing to po Cipro 750mg  po q48h   Height: 5\' 8"  (172.7 cm) Weight: 216 lb 14.4 oz (98.4 kg) IBW/kg (Calculated) : 68.4  Temp (24hrs), Avg:98.2 F (36.8 C), Min:97.3 F (36.3 C), Max:99.3 F (37.4 C)   Recent Labs Lab 05/02/17 0312 05/03/17 0015  WBC 31.9* 20.0*  CREATININE 2.76* 2.36*    Estimated Creatinine Clearance: 27.4 mL/min (A) (by C-G formula based on SCr of 2.36 mg/dL (H)).    Allergies  Allergen Reactions  . Glyxambi [Empagliflozin-Linagliptin] Other (See Comments)    Lethargy, slurring speaking.   . Heparin     Other reaction(s): Heparin-Induced Thrombocytopenia    Antimicrobials this admission: 10/21 cefepime >  Dose adjustments this admission: n/a  Microbiology results: 10/20 BCx x2: GNRs =>pseudomona aeruginosa: Pan sens including Cipro 10/20 BCID : pseudomonas aeruginosa detected  10/20 BCx x2: ngtd x 4 days 10/21 UCx : 100k col/ml Pseudomonas aeruginosa:  Pan sens including Cipro,   And Klebsiella pneumoniae: Resistant to Ampicillin,   Sens to all others tested including Cipro Influenza panel : negative 10/23 BCx x2: ngtd x1 days   Thank you for allowing pharmacy to be a part of this patient's care. Noah Delaineuth Yoshiko Keleher, RPh Clinical Pharmacist Pager: [phone] [phone] phone [phone] or [phone] Main pharmacy [phone] 05/04/2017 2:18  PM

## 2017-05-04 NOTE — Progress Notes (Signed)
PROGRESS NOTE    Johnny [name]  ZOX:[id number] DOB: 02-Jan-1933 DOA: 04/24/2017 PCP: Darci Needle, MD   Chief Complaint  Patient presents with  . N/V    Brief Narrative:  Johnny [name]  is a 83 y.o. male, w hypertension, hyperlipidemia, Dm2,  CVA, macular degeneration, glaucoma apparently c/o vertigo. He was found to have a CVA.  While work up was on-going for this, he developed a fever.  Found to have Pseudomonas bacteremia source was urine-- grew pseudomonas and klebsiella.  Tee done but loop on hold for bacteremia.    Assessment & Plan   Acute CVA -CT head showed no acute abnormality -MRI brain showed scattered punctate infarcts and cerebral hemispheres and larger 10 mm focus in right cerebellum around old right cerebellum infarct -MRA: Nonvisualized right posterior-inferior cerebellar artery seen with artifact, low flow or occlusion. Moderate stenosis of anterior posterior circulation compatible with atherosclerosis. 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 Bilateral 1-39% ICA stenosis. Vertebral artery flow is antegrade -Echocardiogram: EF 55-60%, grade 1 diastolic dysfunction, moderate LVH, status post aVR with mean gradient of 15 mmHg -Hemoglobin A1c 7, LDL 113 -Neurology consulted and appreciated -PT/OT recommended SNF -Continue aspirin 325 mg - atorvastatin: Patient states he had been receiving injections up until one year ago for his cholesterol. -s/p TEE: No evidence for vegetation or an intracardiac thrombus. Positive bubble study. -loop on hold until fever/bacteremia resolves  Leukocytosis -trend -on abx for pseudomonas  Essential hypertension -treat as needed  Fever due to pseudomonas bacteremia/UTI -urine culture: pseudomonas/klebsiella -check x ray normal -blood cultures-- pseudomonas -- repeated on 10/23 now that patient has ben on abx for 48 hours -flu swab negative  Diabetes  mellitus, type II -Continue Lantus, insulin sliding scale, CBG monitoring -Hemoglobin A1c 7  Chronic kidney disease, stage III -Creatinine baseline, continue monitor BMP  DVT Prophylaxis  SCDs  Code Status: Full  Family Communication: None at bedside  Disposition Plan: SNF when work up complete- negative blood cultures-- loop placed here vs after treatment for bacteremia- once cultures negative- will need to discuss with EP  Consultants Neurology Cards (TEE)  Antibiotics   Anti-infectives    Start     Dose/Rate Route Frequency Ordered Stop   05/01/17 1100  ceFEPIme (MAXIPIME) 2 g in dextrose 5 % 50 mL IVPB     2 g 100 mL/hr over 30 Minutes Intravenous Every 24 hours 05/01/17 1032        Subjective:   No overnight events  Objective:   Vitals:   05/04/17 0101 05/04/17 0448 05/04/17 0451 05/04/17 0945  BP: (!) 166/77  (!) 152/80 (!) 157/65  Pulse: 69  67 71  Resp: 20  18   Temp: 98.5 F (36.9 C)  99.3 F (37.4 C) 98 F (36.7 C)  TempSrc: Oral  Oral Oral  SpO2: 100%  93% 98%  Weight:  98.4 kg (216 lb 14.4 oz)    Height:        Intake/Output Summary (Last 24 hours) at 05/04/17 1224 Last data filed at 05/04/17 0341  Gross per 24 hour  Intake          2571.25 ml  Output             2000 ml  Net           571.25 ml   Filed Weights   05/01/17 0600 05/03/17 0500 05/04/17 0448  Weight: 96.4 kg (212 lb  8 oz) 95.4 kg (210 lb 6.4 oz) 98.4 kg (216 lb 14.4 oz)    Exam  General: up in chair, NAD- legally blind  Cardiovascular: rrr  Respiratory: no wheezing  Abdomen: +BS, soft    Data Reviewed: I have personally reviewed following labs and imaging studies  CBC:  Recent Labs Lab 05/02/17 0312 05/03/17 0015  WBC 31.9* 20.0*  HGB 14.3 15.5  HCT 40.1 44.1  MCV 84.6 85.3  PLT 193 207   Basic Metabolic Panel:  Recent Labs Lab 05/02/17 0312 05/03/17 0015  NA 132* 133*  K 4.0 4.2  CL 101 103  CO2 21* 22  GLUCOSE 254* 190*  BUN 41* 40*    CREATININE 2.76* 2.36*  CALCIUM 9.2 9.4   GFR: Estimated Creatinine Clearance: 27.4 mL/min (A) (by C-G formula based on SCr of 2.36 mg/dL (H)). Liver Function Tests: No results for input(s): AST, ALT, ALKPHOS, BILITOT, PROT, ALBUMIN in the last 168 hours. No results for input(s): LIPASE, AMYLASE in the last 168 hours. No results for input(s): AMMONIA in the last 168 hours. Coagulation Profile: No results for input(s): INR, PROTIME in the last 168 hours. Cardiac Enzymes: No results for input(s): CKTOTAL, CKMB, CKMBINDEX, TROPONINI in the last 168 hours. BNP (last 3 results) No results for input(s): PROBNP in the last 8760 hours. HbA1C: No results for input(s): HGBA1C in the last 72 hours. CBG:  Recent Labs Lab 05/03/17 1137 05/03/17 1658 05/03/17 2125 05/04/17 0640 05/04/17 1213  GLUCAP 196* 216* 237* 98 138*   Lipid Profile: No results for input(s): CHOL, HDL, LDLCALC, TRIG, CHOLHDL, LDLDIRECT in the last 72 hours. Thyroid Function Tests: No results for input(s): TSH, T4TOTAL, FREET4, T3FREE, THYROIDAB in the last 72 hours. Anemia Panel: No results for input(s): VITAMINB12, FOLATE, FERRITIN, TIBC, IRON, RETICCTPCT in the last 72 hours. Urine analysis:    Component Value Date/Time   COLORURINE YELLOW 04/30/2017 1731   APPEARANCEUR CLOUDY (A) 04/30/2017 1731   LABSPEC 1.016 04/30/2017 1731   PHURINE 5.0 04/30/2017 1731   GLUCOSEU 50 (A) 04/30/2017 1731   HGBUR SMALL (A) 04/30/2017 1731   BILIRUBINUR NEGATIVE 04/30/2017 1731   KETONESUR NEGATIVE 04/30/2017 1731   PROTEINUR 100 (A) 04/30/2017 1731   UROBILINOGEN 0.2 01/02/2014 2236   NITRITE NEGATIVE 04/30/2017 1731   LEUKOCYTESUR LARGE (A) 04/30/2017 1731    ) Recent Results (from the past 240 hour(s))  Culture, blood (Routine X 2) w Reflex to ID Panel     Status: Abnormal   Collection Time: 04/30/17 12:02 PM  Result Value Ref Range Status   Specimen Description BLOOD LEFT ANTECUBITAL  Final   Special Requests    Final    BOTTLES DRAWN AEROBIC ONLY Blood Culture adequate volume   Culture  Setup Time   Final    GRAM NEGATIVE RODS AEROBIC BOTTLE ONLY CRITICAL RESULT CALLED TO, READ BACK BY AND VERIFIED WITH: A MASTERS,PHARMD AT 1011 05/01/17 BY L BENFIELD    Culture PSEUDOMONAS AERUGINOSA (A)  Final   Report Status 05/03/2017 FINAL  Final   Organism ID, Bacteria PSEUDOMONAS AERUGINOSA  Final      Susceptibility   Pseudomonas aeruginosa - MIC*    CEFTAZIDIME 2 SENSITIVE Sensitive     CIPROFLOXACIN <=0.25 SENSITIVE Sensitive     GENTAMICIN <=1 SENSITIVE Sensitive     IMIPENEM 2 SENSITIVE Sensitive     PIP/TAZO 8 SENSITIVE Sensitive     CEFEPIME <=1 SENSITIVE Sensitive     * PSEUDOMONAS AERUGINOSA  Blood Culture ID  Panel (Reflexed)     Status: Abnormal   Collection Time: 04/30/17 12:02 PM  Result Value Ref Range Status   Enterococcus species NOT DETECTED NOT DETECTED Final   Listeria monocytogenes NOT DETECTED NOT DETECTED Final   Staphylococcus species NOT DETECTED NOT DETECTED Final   Staphylococcus aureus NOT DETECTED NOT DETECTED Final   Streptococcus species NOT DETECTED NOT DETECTED Final   Streptococcus agalactiae NOT DETECTED NOT DETECTED Final   Streptococcus pneumoniae NOT DETECTED NOT DETECTED Final   Streptococcus pyogenes NOT DETECTED NOT DETECTED Final   Acinetobacter baumannii NOT DETECTED NOT DETECTED Final   Enterobacteriaceae species NOT DETECTED NOT DETECTED Final   Enterobacter cloacae complex NOT DETECTED NOT DETECTED Final   Escherichia coli NOT DETECTED NOT DETECTED Final   Klebsiella oxytoca NOT DETECTED NOT DETECTED Final   Klebsiella pneumoniae NOT DETECTED NOT DETECTED Final   Proteus species NOT DETECTED NOT DETECTED Final   Serratia marcescens NOT DETECTED NOT DETECTED Final   Carbapenem resistance NOT DETECTED NOT DETECTED Final   Haemophilus influenzae NOT DETECTED NOT DETECTED Final   Neisseria meningitidis NOT DETECTED NOT DETECTED Final   Pseudomonas  aeruginosa DETECTED (A) NOT DETECTED Final    Comment: CRITICAL RESULT CALLED TO, READ BACK BY AND VERIFIED WITH: A MASTERS,PHARMD AT 1011 05/01/17 BY L BENFIELD    Candida albicans NOT DETECTED NOT DETECTED Final   Candida glabrata NOT DETECTED NOT DETECTED Final   Candida krusei NOT DETECTED NOT DETECTED Final   Candida parapsilosis NOT DETECTED NOT DETECTED Final   Candida tropicalis NOT DETECTED NOT DETECTED Final  Culture, blood (Routine X 2) w Reflex to ID Panel     Status: None (Preliminary result)   Collection Time: 04/30/17 12:07 PM  Result Value Ref Range Status   Specimen Description BLOOD RIGHT ANTECUBITAL  Final   Special Requests IN PEDIATRIC BOTTLE Blood Culture adequate volume  Final   Culture NO GROWTH 4 DAYS  Final   Report Status PENDING  Incomplete  Culture, Urine     Status: Abnormal   Collection Time: 05/01/17 12:16 PM  Result Value Ref Range Status   Specimen Description URINE, CLEAN CATCH  Final   Special Requests NONE  Final   Culture (A)  Final    >=100,000 COLONIES/mL PSEUDOMONAS AERUGINOSA >=100,000 COLONIES/mL KLEBSIELLA PNEUMONIAE    Report Status 05/04/2017 FINAL  Final   Organism ID, Bacteria PSEUDOMONAS AERUGINOSA (A)  Final   Organism ID, Bacteria KLEBSIELLA PNEUMONIAE (A)  Final      Susceptibility   Klebsiella pneumoniae - MIC*    AMPICILLIN >=32 RESISTANT Resistant     CEFAZOLIN <=4 SENSITIVE Sensitive     CEFTRIAXONE <=1 SENSITIVE Sensitive     CIPROFLOXACIN <=0.25 SENSITIVE Sensitive     GENTAMICIN <=1 SENSITIVE Sensitive     IMIPENEM <=0.25 SENSITIVE Sensitive     NITROFURANTOIN 64 INTERMEDIATE Intermediate     TRIMETH/SULFA <=20 SENSITIVE Sensitive     AMPICILLIN/SULBACTAM 4 SENSITIVE Sensitive     PIP/TAZO <=4 SENSITIVE Sensitive     Extended ESBL NEGATIVE Sensitive     * >=100,000 COLONIES/mL KLEBSIELLA PNEUMONIAE   Pseudomonas aeruginosa - MIC*    CEFTAZIDIME 4 SENSITIVE Sensitive     CIPROFLOXACIN <=0.25 SENSITIVE Sensitive      GENTAMICIN <=1 SENSITIVE Sensitive     IMIPENEM 2 SENSITIVE Sensitive     PIP/TAZO 8 SENSITIVE Sensitive     CEFEPIME 2 SENSITIVE Sensitive     * >=100,000 COLONIES/mL PSEUDOMONAS AERUGINOSA  Culture, blood (Routine  X 2) w Reflex to ID Panel     Status: None (Preliminary result)   Collection Time: 05/03/17 12:15 AM  Result Value Ref Range Status   Specimen Description BLOOD LEFT ANTECUBITAL  Final   Special Requests   Final    BOTTLES DRAWN AEROBIC AND ANAEROBIC Blood Culture adequate volume   Culture NO GROWTH 1 DAY  Final   Report Status PENDING  Incomplete  Culture, blood (Routine X 2) w Reflex to ID Panel     Status: None (Preliminary result)   Collection Time: 05/03/17 12:15 AM  Result Value Ref Range Status   Specimen Description BLOOD LEFT HAND  Final   Special Requests   Final    BOTTLES DRAWN AEROBIC AND ANAEROBIC Blood Culture adequate volume   Culture NO GROWTH 1 DAY  Final   Report Status PENDING  Incomplete      Radiology Studies: US Renal  Result Date: 05/02/2017 CLINICAL DATA:  83 year old male with UTI. History of bilateral renal cysts. EXAM: RENAL / URINARY TRACT ULTRASOUND COMPLETE COMPARISON:  CT of the abdomen pelvis dated 09/06/2010 FINDINGS: Right Kidney: Length: 11.3 cm. There multiple cysts in the right kidney. The largest cyst is in the superior pole and measures 5.5 x 5.4 x 6.3 cm. This cyst appears to have an area of peripheral calcification with an apparent partially calcified septum. This likely corresponds to the cyst seen on the CT of 2012. Evaluation of the cyst is limited due to technique. Nonemergent MRI may provide better characterization if clinically indicated. There is no hydronephrosis or echogenic stone. Left Kidney: Length: 11.7 cm. There is a 1.1 x 1.1 x 1.5 cm left renal interpolar cyst. No hydronephrosis or echogenic stone. Bladder: The urinary bladder is collapsed and not visualized. IMPRESSION: 1. No hydronephrosis or echogenic stone. 2.  Probable bilateral renal cysts. Electronically Signed   By: Elgie Collard M.D.   On: 05/02/2017 22:43     Scheduled Meds: . allopurinol  200 mg Oral QHS  . aspirin  300 mg Rectal Daily   Or  . aspirin  325 mg Oral Daily  . atorvastatin  20 mg Oral q1800  . docusate sodium  100 mg Oral BID  . ferrous sulfate  325 mg Oral BID  . folic acid  1 mg Oral Daily  . furosemide  40 mg Oral Daily  . hydrALAZINE  50 mg Oral TID  . insulin aspart  0-5 Units Subcutaneous QHS  . insulin aspart  0-9 Units Subcutaneous TID WC  . insulin glargine  35 Units Subcutaneous BID  . latanoprost  1 drop Both Eyes QHS  . metoprolol tartrate  50 mg Oral BID  . pantoprazole  40 mg Oral Daily  . senna  1 tablet Oral QHS  . sodium chloride flush  3 mL Intravenous Q12H   Continuous Infusions: . sodium chloride    . sodium chloride 75 mL/hr at 05/03/17 2224  . ceFEPime (MAXIPIME) IV 2 g (05/04/17 1049)     LOS: 7 days   Time Spent in minutes   30 minutes  Vallery Mcdade U Geniya Fulgham D.O. on 05/04/2017 at 12:24 PM   [facility] Office  [phone]

## 2017-05-05 ENCOUNTER — Inpatient Hospital Stay (HOSPITAL_COMMUNITY): Payer: Medicare Other

## 2017-05-05 LAB — CBC
HCT: 41.4 % (ref 39.0–52.0)
Hemoglobin: 14.5 g/dL (ref 13.0–17.0)
MCH: 29.5 pg (ref 26.0–34.0)
MCHC: 35 g/dL (ref 30.0–36.0)
MCV: 84.1 fL (ref 78.0–100.0)
PLATELETS: 257 10*3/uL (ref 150–400)
RBC: 4.92 MIL/uL (ref 4.22–5.81)
RDW: 14.1 % (ref 11.5–15.5)
WBC: 14.4 10*3/uL — ABNORMAL HIGH (ref 4.0–10.5)

## 2017-05-05 LAB — BASIC METABOLIC PANEL
Anion gap: 10 (ref 5–15)
BUN: 27 mg/dL — AB (ref 6–20)
CALCIUM: 9.2 mg/dL (ref 8.9–10.3)
CO2: 19 mmol/L — ABNORMAL LOW (ref 22–32)
CREATININE: 1.86 mg/dL — AB (ref 0.61–1.24)
Chloride: 106 mmol/L (ref 101–111)
GFR calc Af Amer: 37 mL/min — ABNORMAL LOW (ref >60)
GFR, EST NON AFRICAN AMERICAN: 32 mL/min — AB (ref >60)
GLUCOSE: 114 mg/dL — AB (ref 65–99)
POTASSIUM: 3.7 mmol/L (ref 3.5–5.1)
SODIUM: 135 mmol/L (ref 135–145)

## 2017-05-05 LAB — GLUCOSE, CAPILLARY
GLUCOSE-CAPILLARY: 134 mg/dL — AB (ref 65–99)
GLUCOSE-CAPILLARY: 138 mg/dL — AB (ref 65–99)
GLUCOSE-CAPILLARY: 132 mg/dL — AB (ref 65–99)
Glucose-Capillary: 151 mg/dL — ABNORMAL HIGH (ref 65–99)

## 2017-05-05 LAB — CULTURE, BLOOD (ROUTINE X 2)
Culture: NO GROWTH
Special Requests: ADEQUATE

## 2017-05-05 LAB — URIC ACID: Uric Acid, Serum: 5.6 mg/dL (ref 4.4–7.6)

## 2017-05-05 MED ORDER — COLCHICINE 0.6 MG PO TABS
0.6000 mg | ORAL_TABLET | Freq: Every day | ORAL | Status: DC
  Administered 2017-05-05 – 2017-05-07 (×3): 0.6 mg via ORAL
  Filled 2017-05-05 (×3): qty 1

## 2017-05-05 MED ORDER — DICLOFENAC SODIUM 1 % TD GEL
2.0000 g | Freq: Four times a day (QID) | TRANSDERMAL | Status: DC
  Administered 2017-05-05 – 2017-05-07 (×5): 2 g via TOPICAL
  Filled 2017-05-05: qty 100

## 2017-05-05 NOTE — Progress Notes (Signed)
PROGRESS NOTE    BRAELEN SPROULE  WUJ:[id number] DOB: 23-Dec-1932 DOA: 04/24/2017 PCP: Darci Needle, MD   Chief Complaint  Patient presents with  . N/V    Brief Narrative:  Nolin Grell  is a 83 y.o. male, w hypertension, hyperlipidemia, Dm2,  CVA, macular degeneration, glaucoma apparently c/o vertigo. He was found to have a CVA.  While work up was on-going for this, he developed a fever.  Found to have Pseudomonas bacteremia source was urine-- grew pseudomonas and klebsiella.  Tee done but loop on hold for bacteremia.    Assessment & Plan   Acute CVA -CT head showed no acute abnormality -MRI brain showed scattered punctate infarcts and cerebral hemispheres and larger 10 mm focus in right cerebellum around old right cerebellum infarct -MRA: Nonvisualized right posterior-inferior cerebellar artery seen with artifact, low flow or occlusion. Moderate stenosis of anterior posterior circulation compatible with atherosclerosis. 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 Bilateral 1-39% ICA stenosis. Vertebral artery flow is antegrade -Echocardiogram: EF 55-60%, grade 1 diastolic dysfunction, moderate LVH, status post aVR with mean gradient of 15 mmHg -Hemoglobin A1c 7, LDL 113 -Neurology consulted and appreciated -PT/OT recommended SNF -Continue aspirin 325 mg - atorvastatin: Patient states he had been receiving injections up until one year ago for his cholesterol. -s/p TEE: No evidence for vegetation or an intracardiac thrombus. Positive bubble study. -loop on hold until fever/bacteremia resolves . EP will evaluate patient on 10-26  Left knee pain, check X ray. Start colchicine history of gout.   Leukocytosis -trending down.  -on abx for pseudomonas  Essential hypertension -treat as needed   pseudomonas bacteremia/UTI -urine culture: pseudomonas/klebsiella -check x ray normal -blood cultures-- pseudomonas  -- repeated on 10/23 now that patient has ben on abx for 48 hours -flu swab negative Repeated blood culture 10-23 no growth.  Discussed with ID tx for 7 to 10 days with cipro.   Diabetes mellitus, type II -Continue Lantus, insulin sliding scale, CBG monitoring -Hemoglobin A1c 7  Chronic kidney disease, stage III -Creatinine baseline, continue monitor BMP  DVT Prophylaxis  SCDs  Code Status: Full  Family Communication: None at bedside  Disposition Plan: SNF when work up complete- negative blood cultures-- loop placed here vs after treatment for bacteremia- once cultures negative- will need to discuss with EP  Consultants Neurology Cards (TEE)  Antibiotics   Anti-infectives    Start     Dose/Rate Route Frequency Ordered Stop   05/01/17 1100  ceFEPIme (MAXIPIME) 2 g in dextrose 5 % 50 mL IVPB     2 g 100 mL/hr over 30 Minutes Intravenous Every 24 hours 05/01/17 1032        Subjective:   He is alert, he is complaining of left knee pain, mild edema.  Per nurse tech, patient now requiring 2 assist for ambulation, over weekend he was more active. I suspect is related to left Knee pain, rest of neuro exam is non focal.   Objective:   Vitals:   05/05/17 0109 05/05/17 0427 05/05/17 0952 05/05/17 1425  BP: (!) 178/77 (!) 169/74 (!) 167/73 (!) 167/79  Pulse: 76 80 79 73  Resp: 18 18 17 18   Temp: 98.8 F (37.1 C) 99.6 F (37.6 C) 98.8 F (37.1 C) 98.2 F (36.8 C)  TempSrc: Oral Oral Oral Oral  SpO2: 99% 95% 96% 96%  Weight:  99.5 kg (219 lb 6.4 oz)    Height:  Intake/Output Summary (Last 24 hours) at 05/05/17 1613 Last data filed at 05/05/17 1300  Gross per 24 hour  Intake              360 ml  Output              500 ml  Net             -140 ml   Filed Weights   05/03/17 0500 05/04/17 0448 05/05/17 0427  Weight: 95.4 kg (210 lb 6.4 oz) 98.4 kg (216 lb 14.4 oz) 99.5 kg (219 lb 6.4 oz)    Exam  General: up in chair. legally blind  Cardiovascular:  S 1, S  2 RRR  Respiratory: CTA  Abdomen: BS present, soft, nt.   Neuro exam; upper extremity motor strength 5/5 right LE 5/5, left with limitation due to severe knee pain.     Data Reviewed: I have personally reviewed following labs and imaging studies  CBC:  Recent Labs Lab 05/02/17 0312 05/03/17 0015 05/05/17 0616  WBC 31.9* 20.0* 14.4*  HGB 14.3 15.5 14.5  HCT 40.1 44.1 41.4  MCV 84.6 85.3 84.1  PLT 193 207 257   Basic Metabolic Panel:  Recent Labs Lab 05/02/17 0312 05/03/17 0015 05/05/17 0616  NA 132* 133* 135  K 4.0 4.2 3.7  CL 101 103 106  CO2 21* 22 19*  GLUCOSE 254* 190* 114*  BUN 41* 40* 27*  CREATININE 2.76* 2.36* 1.86*  CALCIUM 9.2 9.4 9.2   GFR: Estimated Creatinine Clearance: 35 mL/min (A) (by C-G formula based on SCr of 1.86 mg/dL (H)). Liver Function Tests: No results for input(s): AST, ALT, ALKPHOS, BILITOT, PROT, ALBUMIN in the last 168 hours. No results for input(s): LIPASE, AMYLASE in the last 168 hours. No results for input(s): AMMONIA in the last 168 hours. Coagulation Profile: No results for input(s): INR, PROTIME in the last 168 hours. Cardiac Enzymes: No results for input(s): CKTOTAL, CKMB, CKMBINDEX, TROPONINI in the last 168 hours. BNP (last 3 results) No results for input(s): PROBNP in the last 8760 hours. HbA1C: No results for input(s): HGBA1C in the last 72 hours. CBG:  Recent Labs Lab 05/04/17 1213 05/04/17 1615 05/04/17 2109 05/05/17 0625 05/05/17 1123  GLUCAP 138* 132* 188* 134* 138*   Lipid Profile: No results for input(s): CHOL, HDL, LDLCALC, TRIG, CHOLHDL, LDLDIRECT in the last 72 hours. Thyroid Function Tests: No results for input(s): TSH, T4TOTAL, FREET4, T3FREE, THYROIDAB in the last 72 hours. Anemia Panel: No results for input(s): VITAMINB12, FOLATE, FERRITIN, TIBC, IRON, RETICCTPCT in the last 72 hours. Urine analysis:    Component Value Date/Time   COLORURINE YELLOW 04/30/2017 1731   APPEARANCEUR CLOUDY (A)  04/30/2017 1731   LABSPEC 1.016 04/30/2017 1731   PHURINE 5.0 04/30/2017 1731   GLUCOSEU 50 (A) 04/30/2017 1731   HGBUR SMALL (A) 04/30/2017 1731   BILIRUBINUR NEGATIVE 04/30/2017 1731   KETONESUR NEGATIVE 04/30/2017 1731   PROTEINUR 100 (A) 04/30/2017 1731   UROBILINOGEN 0.2 01/02/2014 2236   NITRITE NEGATIVE 04/30/2017 1731   LEUKOCYTESUR LARGE (A) 04/30/2017 1731    ) Recent Results (from the past 240 hour(s))  Culture, blood (Routine X 2) w Reflex to ID Panel     Status: Abnormal   Collection Time: 04/30/17 12:02 PM  Result Value Ref Range Status   Specimen Description BLOOD LEFT ANTECUBITAL  Final   Special Requests   Final    BOTTLES DRAWN AEROBIC ONLY Blood Culture adequate volume   Culture  Setup Time   Final    GRAM NEGATIVE RODS AEROBIC BOTTLE ONLY CRITICAL RESULT CALLED TO, READ BACK BY AND VERIFIED WITH: A MASTERS,PHARMD AT 1011 05/01/17 BY L BENFIELD    Culture PSEUDOMONAS AERUGINOSA (A)  Final   Report Status 05/03/2017 FINAL  Final   Organism ID, Bacteria PSEUDOMONAS AERUGINOSA  Final      Susceptibility   Pseudomonas aeruginosa - MIC*    CEFTAZIDIME 2 SENSITIVE Sensitive     CIPROFLOXACIN <=0.25 SENSITIVE Sensitive     GENTAMICIN <=1 SENSITIVE Sensitive     IMIPENEM 2 SENSITIVE Sensitive     PIP/TAZO 8 SENSITIVE Sensitive     CEFEPIME <=1 SENSITIVE Sensitive     * PSEUDOMONAS AERUGINOSA  Blood Culture ID Panel (Reflexed)     Status: Abnormal   Collection Time: 04/30/17 12:02 PM  Result Value Ref Range Status   Enterococcus species NOT DETECTED NOT DETECTED Final   Listeria monocytogenes NOT DETECTED NOT DETECTED Final   Staphylococcus species NOT DETECTED NOT DETECTED Final   Staphylococcus aureus NOT DETECTED NOT DETECTED Final   Streptococcus species NOT DETECTED NOT DETECTED Final   Streptococcus agalactiae NOT DETECTED NOT DETECTED Final   Streptococcus pneumoniae NOT DETECTED NOT DETECTED Final   Streptococcus pyogenes NOT DETECTED NOT DETECTED  Final   Acinetobacter baumannii NOT DETECTED NOT DETECTED Final   Enterobacteriaceae species NOT DETECTED NOT DETECTED Final   Enterobacter cloacae complex NOT DETECTED NOT DETECTED Final   Escherichia coli NOT DETECTED NOT DETECTED Final   Klebsiella oxytoca NOT DETECTED NOT DETECTED Final   Klebsiella pneumoniae NOT DETECTED NOT DETECTED Final   Proteus species NOT DETECTED NOT DETECTED Final   Serratia marcescens NOT DETECTED NOT DETECTED Final   Carbapenem resistance NOT DETECTED NOT DETECTED Final   Haemophilus influenzae NOT DETECTED NOT DETECTED Final   Neisseria meningitidis NOT DETECTED NOT DETECTED Final   Pseudomonas aeruginosa DETECTED (A) NOT DETECTED Final    Comment: CRITICAL RESULT CALLED TO, READ BACK BY AND VERIFIED WITH: A MASTERS,PHARMD AT 1011 05/01/17 BY L BENFIELD    Candida albicans NOT DETECTED NOT DETECTED Final   Candida glabrata NOT DETECTED NOT DETECTED Final   Candida krusei NOT DETECTED NOT DETECTED Final   Candida parapsilosis NOT DETECTED NOT DETECTED Final   Candida tropicalis NOT DETECTED NOT DETECTED Final  Culture, blood (Routine X 2) w Reflex to ID Panel     Status: None   Collection Time: 04/30/17 12:07 PM  Result Value Ref Range Status   Specimen Description BLOOD RIGHT ANTECUBITAL  Final   Special Requests IN PEDIATRIC BOTTLE Blood Culture adequate volume  Final   Culture NO GROWTH 5 DAYS  Final   Report Status 05/05/2017 FINAL  Final  Culture, Urine     Status: Abnormal   Collection Time: 05/01/17 12:16 PM  Result Value Ref Range Status   Specimen Description URINE, CLEAN CATCH  Final   Special Requests NONE  Final   Culture (A)  Final    >=100,000 COLONIES/mL PSEUDOMONAS AERUGINOSA >=100,000 COLONIES/mL KLEBSIELLA PNEUMONIAE    Report Status 05/04/2017 FINAL  Final   Organism ID, Bacteria PSEUDOMONAS AERUGINOSA (A)  Final   Organism ID, Bacteria KLEBSIELLA PNEUMONIAE (A)  Final      Susceptibility   Klebsiella pneumoniae - MIC*     AMPICILLIN >=32 RESISTANT Resistant     CEFAZOLIN <=4 SENSITIVE Sensitive     CEFTRIAXONE <=1 SENSITIVE Sensitive     CIPROFLOXACIN <=0.25 SENSITIVE Sensitive  GENTAMICIN <=1 SENSITIVE Sensitive     IMIPENEM <=0.25 SENSITIVE Sensitive     NITROFURANTOIN 64 INTERMEDIATE Intermediate     TRIMETH/SULFA <=20 SENSITIVE Sensitive     AMPICILLIN/SULBACTAM 4 SENSITIVE Sensitive     PIP/TAZO <=4 SENSITIVE Sensitive     Extended ESBL NEGATIVE Sensitive     * >=100,000 COLONIES/mL KLEBSIELLA PNEUMONIAE   Pseudomonas aeruginosa - MIC*    CEFTAZIDIME 4 SENSITIVE Sensitive     CIPROFLOXACIN <=0.25 SENSITIVE Sensitive     GENTAMICIN <=1 SENSITIVE Sensitive     IMIPENEM 2 SENSITIVE Sensitive     PIP/TAZO 8 SENSITIVE Sensitive     CEFEPIME 2 SENSITIVE Sensitive     * >=100,000 COLONIES/mL PSEUDOMONAS AERUGINOSA  Culture, blood (Routine X 2) w Reflex to ID Panel     Status: None (Preliminary result)   Collection Time: 05/03/17 12:15 AM  Result Value Ref Range Status   Specimen Description BLOOD LEFT ANTECUBITAL  Final   Special Requests   Final    BOTTLES DRAWN AEROBIC AND ANAEROBIC Blood Culture adequate volume   Culture NO GROWTH 2 DAYS  Final   Report Status PENDING  Incomplete  Culture, blood (Routine X 2) w Reflex to ID Panel     Status: None (Preliminary result)   Collection Time: 05/03/17 12:15 AM  Result Value Ref Range Status   Specimen Description BLOOD LEFT HAND  Final   Special Requests   Final    BOTTLES DRAWN AEROBIC AND ANAEROBIC Blood Culture adequate volume   Culture NO GROWTH 2 DAYS  Final   Report Status PENDING  Incomplete      Radiology Studies: Dg Knee Complete 4 Views Left  Result Date: 05/05/2017 CLINICAL DATA:  Left knee pain for 5 days, no acute injury EXAM: LEFT KNEE - COMPLETE 4+ VIEW COMPARISON:  None. FINDINGS: There is tricompartmental degenerative joint disease the left knee primarily involving the medial compartment where there is more loss of joint space  and sclerosis with spurring. No acute fracture is seen. There may be a small joint effusion present. Also there is faint chondrocalcinosis noted which may indicate CPPD arthropathy. IMPRESSION: 1. Tricompartmental degenerative joint disease primarily involving the medial compartment. 2. Tiny amount of left joint fluid. 3. Chondrocalcinosis.  Question CPPD arthropathy. Electronically Signed   By: Dwyane DeePaul  Barry M.D.   On: 05/05/2017 15:29     Scheduled Meds: . allopurinol  200 mg Oral QHS  . aspirin  300 mg Rectal Daily   Or  . aspirin  325 mg Oral Daily  . atorvastatin  20 mg Oral q1800  . colchicine  0.6 mg Oral Daily  . diclofenac sodium  2 g Topical QID  . docusate sodium  100 mg Oral BID  . ferrous sulfate  325 mg Oral BID  . folic acid  1 mg Oral Daily  . furosemide  40 mg Oral Daily  . hydrALAZINE  50 mg Oral TID  . insulin aspart  0-5 Units Subcutaneous QHS  . insulin aspart  0-9 Units Subcutaneous TID WC  . insulin glargine  35 Units Subcutaneous BID  . latanoprost  1 drop Both Eyes QHS  . metoprolol tartrate  50 mg Oral BID  . pantoprazole  40 mg Oral Daily  . senna  1 tablet Oral QHS  . sodium chloride flush  3 mL Intravenous Q12H   Continuous Infusions: . sodium chloride    . ceFEPime (MAXIPIME) IV Stopped (05/05/17 1043)     LOS: 8 days  Time Spent in minutes   30 minutes  Aminat Shelburne A D.O. on 05/05/2017 at 4:13 PM   [facility] Office  [phone]

## 2017-05-05 NOTE — Progress Notes (Signed)
PT Cancellation Note  Patient Details Name: Johnny [name] MRN: [id number] DOB: 1932/08/02   Cancelled Treatment:    Reason Eval/Treat Not Completed: Other (comment). Pt's RN requesting to hold therapy for now. [facility] and planning to go to x-ray later today. PT will continue to f/u with pt as available.    Alessandra BevelsJennifer M [place] M[place]uen 05/05/2017, 1:58 PM

## 2017-05-05 NOTE — Care Management Important Message (Signed)
Important Message  Patient Details  Name: Johnny FellingSolomon B Acton MRN: [id number] Date of Birth: October 12, 1932   Medicare Important Message Given:  Yes    Kyla BalzarineShealy, Jaylenn Altier Abena 05/05/2017, 9:52 AM

## 2017-05-06 LAB — SYNOVIAL CELL COUNT + DIFF, W/ CRYSTALS
CRYSTALS FLUID: NONE SEEN
LYMPHOCYTES-SYNOVIAL FLD: 3 % (ref 0–20)
MONOCYTE-MACROPHAGE-SYNOVIAL FLUID: 9 % — AB (ref 50–90)
NEUTROPHIL, SYNOVIAL: 88 % — AB (ref 0–25)
WBC, SYNOVIAL: 6600 /mm3 — AB (ref 0–200)

## 2017-05-06 LAB — BASIC METABOLIC PANEL
ANION GAP: 9 (ref 5–15)
BUN: 28 mg/dL — ABNORMAL HIGH (ref 6–20)
CHLORIDE: 105 mmol/L (ref 101–111)
CO2: 22 mmol/L (ref 22–32)
Calcium: 9.6 mg/dL (ref 8.9–10.3)
Creatinine, Ser: 2.13 mg/dL — ABNORMAL HIGH (ref 0.61–1.24)
GFR calc Af Amer: 32 mL/min — ABNORMAL LOW (ref >60)
GFR, EST NON AFRICAN AMERICAN: 27 mL/min — AB (ref >60)
Glucose, Bld: 121 mg/dL — ABNORMAL HIGH (ref 65–99)
POTASSIUM: 4 mmol/L (ref 3.5–5.1)
SODIUM: 136 mmol/L (ref 135–145)

## 2017-05-06 LAB — URIC ACID: URIC ACID, SERUM: 5.9 mg/dL (ref 4.4–7.6)

## 2017-05-06 LAB — CBC
HCT: 43.1 % (ref 39.0–52.0)
HEMOGLOBIN: 14.9 g/dL (ref 13.0–17.0)
MCH: 29.4 pg (ref 26.0–34.0)
MCHC: 34.6 g/dL (ref 30.0–36.0)
MCV: 85 fL (ref 78.0–100.0)
PLATELETS: 283 10*3/uL (ref 150–400)
RBC: 5.07 MIL/uL (ref 4.22–5.81)
RDW: 14.6 % (ref 11.5–15.5)
WBC: 14.8 10*3/uL — AB (ref 4.0–10.5)

## 2017-05-06 LAB — GLUCOSE, CAPILLARY
GLUCOSE-CAPILLARY: 92 mg/dL (ref 65–99)
GLUCOSE-CAPILLARY: 170 mg/dL — AB (ref 65–99)
Glucose-Capillary: 131 mg/dL — ABNORMAL HIGH (ref 65–99)

## 2017-05-06 LAB — C-REACTIVE PROTEIN: CRP: 18.1 mg/dL — AB (ref <1.0)

## 2017-05-06 LAB — SEDIMENTATION RATE: SED RATE: 97 mm/h — AB (ref 0–16)

## 2017-05-06 MED ORDER — LIDOCAINE HCL (PF) 1 % IJ SOLN
5.0000 mL | Freq: Once | INTRAMUSCULAR | Status: AC
  Administered 2017-05-06: 5 mL
  Filled 2017-05-06: qty 5

## 2017-05-06 MED ORDER — BUPIVACAINE HCL (PF) 0.5 % IJ SOLN
10.0000 mL | Freq: Once | INTRAMUSCULAR | Status: AC
  Administered 2017-05-06: 10 mL
  Filled 2017-05-06: qty 10
  Filled 2017-05-06: qty 30

## 2017-05-06 MED ORDER — METHYLPREDNISOLONE ACETATE 40 MG/ML IJ SUSP
40.0000 mg | Freq: Once | INTRAMUSCULAR | Status: AC
  Administered 2017-05-06: 40 mg via INTRA_ARTICULAR
  Filled 2017-05-06: qty 1

## 2017-05-06 NOTE — Procedures (Signed)
Procedure: Left knee aspiration and injection  Indication: Left knee effusion(s)  Surgeon: Charma IgoMichael Jeffery, PA-C  Assist: None  Anesthesia: None  EBL: None  Complications: None  Findings: After risks/benefits explained patient desires to undergo procedure. Consent obtained and time out performed. The left knee was sterilely prepped and aspirated. 8ml relatively clear fluid aspirate with bloody fluid aspirated at end. Pt tolerated the procedure well.  UPDATE: GS showed no organisms, injected knee with 2.725ml of 1% lidocaine and 0.5% marcaine each and 40mg  depo-medrol.    Freeman CaldronMichael J. Jeffery, PA-C Orthopedic Surgery [phone]

## 2017-05-06 NOTE — Consult Note (Signed)
Reason for Consult:Left knee pain Referring Physician: B Regalado  Johnny [name] is an 83 y.o. male.  HPI: Johnny [name] was admitted nearly 2 weeks ago s/p CVA. While here he developed bacteremia from a Pseudomonas and Klebsiella UTI and is currently being treated with Cipro. About 5d ago he began c/o left knee pain without any obvious precipitating event. He c/o pain with motion and with WB. It also seems swollen. He notes prior hx/o similar events 2/2 gout for which he is on allopurinol.   Past Medical History:  Diagnosis Date  . Aortic stenosis   . BPH (benign prostatic hypertrophy)   . Branch retinal vein occlusion 08/01/2013  . CAD (coronary artery disease)   . CVA (cerebral infarction)   . Diabetes mellitus ([place])   . Diabetic retinopathy ([place])   . Gait disturbance   . GERD (gastroesophageal reflux disease)   . Gout   . Heart murmur   . HTN (hypertension)   . Macular degeneration   . Macular degeneration   . Macular edema   . Paresthesia   . Pseudophakia of right eye   . Ptosis     Past Surgical History:  Procedure Laterality Date  . AORTIC VALVE REPLACEMENT  03/26/2013  . CATARACT EXTRACTION W/ INTRAOCULAR LENS IMPLANT  11/01/2013  . CATARACT EXTRACTION W/ INTRAOCULAR LENS IMPLANT Right 12/18/2009  . COLONOSCOPY W/ POLYPECTOMY    . CORONARY ARTERY BYPASS GRAFT  03/26/2013  . EYE SURGERY  03/19/2016   Ocular tube shunt insertion  . PARS PLANA VITRECTOMY  03/19/2016  . PARS PLANA VITRECTOMY  11/08/2013  . PARS PLANA VITRECTOMY  08/02/2012  . PROSTATE SURGERY    . TEE WITHOUT CARDIOVERSION N/A 05/02/2017   Procedure: TRANSESOPHAGEAL ECHOCARDIOGRAM (TEE);  Surgeon: Dorothy Spark, MD;  Location: [facility] ENDOSCOPY;  Service: Cardiovascular;  Laterality: N/A;    Family History  Problem Relation Age of Onset  . Breast cancer Sister   . Lung cancer Brother     Social History:  reports that he has quit smoking. His smoking use included Cigarettes. He has a 40.00 pack-year  smoking history. He has never used smokeless tobacco. He reports that he does not drink alcohol or use drugs.  Allergies:  Allergies  Allergen Reactions  . Glyxambi [Empagliflozin-Linagliptin] Other (See Comments)    Lethargy, slurring speaking.   . Heparin     Other reaction(s): Heparin-Induced Thrombocytopenia    Medications: I have reviewed the patient's current medications.  Results for orders placed or performed during the hospital encounter of 04/24/17 (from the past 48 hour(s))  Glucose, capillary     Status: Abnormal   Collection Time: 05/04/17  4:15 PM  Result Value Ref Range   Glucose-Capillary 132 (H) 65 - 99 mg/dL  Glucose, capillary     Status: Abnormal   Collection Time: 05/04/17  9:09 PM  Result Value Ref Range   Glucose-Capillary 188 (H) 65 - 99 mg/dL   Comment 1 Notify RN    Comment 2 Document in Chart   CBC     Status: Abnormal   Collection Time: 05/05/17  6:16 AM  Result Value Ref Range   WBC 14.4 (H) 4.0 - 10.5 K/uL   RBC 4.92 4.22 - 5.81 MIL/uL   Hemoglobin 14.5 13.0 - 17.0 g/dL   HCT 41.4 39.0 - 52.0 %   MCV 84.1 78.0 - 100.0 fL   MCH 29.5 26.0 - 34.0 pg   MCHC 35.0 30.0 - 36.0 g/dL   RDW 14.1  11.5 - 15.5 %   Platelets 257 150 - 400 K/uL  Basic metabolic panel     Status: Abnormal   Collection Time: 05/05/17  6:16 AM  Result Value Ref Range   Sodium 135 135 - 145 mmol/L   Potassium 3.7 3.5 - 5.1 mmol/L   Chloride 106 101 - 111 mmol/L   CO2 19 (L) 22 - 32 mmol/L   Glucose, Bld 114 (H) 65 - 99 mg/dL   BUN 27 (H) 6 - 20 mg/dL   Creatinine, Ser 1.86 (H) 0.61 - 1.24 mg/dL   Calcium 9.2 8.9 - 10.3 mg/dL   GFR calc non Af Amer 32 (L) >60 mL/min   GFR calc Af Amer 37 (L) >60 mL/min    Comment: (NOTE) The eGFR has been calculated using the CKD EPI equation. This calculation has not been validated in all clinical situations. eGFR's persistently <60 mL/min signify possible Chronic Kidney Disease.    Anion gap 10 5 - 15  Glucose, capillary     Status:  Abnormal   Collection Time: 05/05/17  6:25 AM  Result Value Ref Range   Glucose-Capillary 134 (H) 65 - 99 mg/dL   Comment 1 Notify RN    Comment 2 Document in Chart   Glucose, capillary     Status: Abnormal   Collection Time: 05/05/17 11:23 AM  Result Value Ref Range   Glucose-Capillary 138 (H) 65 - 99 mg/dL  Uric acid     Status: None   Collection Time: 05/05/17 12:57 PM  Result Value Ref Range   Uric Acid, Serum 5.6 4.4 - 7.6 mg/dL  Glucose, capillary     Status: Abnormal   Collection Time: 05/05/17  4:23 PM  Result Value Ref Range   Glucose-Capillary 151 (H) 65 - 99 mg/dL  Glucose, capillary     Status: Abnormal   Collection Time: 05/05/17  9:39 PM  Result Value Ref Range   Glucose-Capillary 132 (H) 65 - 99 mg/dL   Comment 1 Notify RN    Comment 2 Document in Chart   Glucose, capillary     Status: None   Collection Time: 05/06/17  6:16 AM  Result Value Ref Range   Glucose-Capillary 92 65 - 99 mg/dL   Comment 1 Notify RN    Comment 2 Document in Chart   CBC     Status: Abnormal   Collection Time: 05/06/17  8:56 AM  Result Value Ref Range   WBC 14.8 (H) 4.0 - 10.5 K/uL   RBC 5.07 4.22 - 5.81 MIL/uL   Hemoglobin 14.9 13.0 - 17.0 g/dL   HCT 43.1 39.0 - 52.0 %   MCV 85.0 78.0 - 100.0 fL   MCH 29.4 26.0 - 34.0 pg   MCHC 34.6 30.0 - 36.0 g/dL   RDW 14.6 11.5 - 15.5 %   Platelets 283 150 - 400 K/uL  Basic metabolic panel     Status: Abnormal   Collection Time: 05/06/17  8:56 AM  Result Value Ref Range   Sodium 136 135 - 145 mmol/L   Potassium 4.0 3.5 - 5.1 mmol/L   Chloride 105 101 - 111 mmol/L   CO2 22 22 - 32 mmol/L   Glucose, Bld 121 (H) 65 - 99 mg/dL   BUN 28 (H) 6 - 20 mg/dL   Creatinine, Ser 2.13 (H) 0.61 - 1.24 mg/dL   Calcium 9.6 8.9 - 10.3 mg/dL   GFR calc non Af Amer 27 (L) >60 mL/min   GFR calc  Af Amer 32 (L) >60 mL/min    Comment: (NOTE) The eGFR has been calculated using the CKD EPI equation. This calculation has not been validated in all clinical  situations. eGFR's persistently <60 mL/min signify possible Chronic Kidney Disease.    Anion gap 9 5 - 15  Glucose, capillary     Status: Abnormal   Collection Time: 05/06/17 11:29 AM  Result Value Ref Range   Glucose-Capillary 170 (H) 65 - 99 mg/dL  Uric acid     Status: None   Collection Time: 05/06/17 12:01 PM  Result Value Ref Range   Uric Acid, Serum 5.9 4.4 - 7.6 mg/dL  C-reactive protein     Status: Abnormal   Collection Time: 05/06/17 12:01 PM  Result Value Ref Range   CRP 18.1 (H) <1.0 mg/dL    Dg Knee Complete 4 Views Left  Result Date: 05/05/2017 CLINICAL DATA:  Left knee pain for 5 days, no acute injury EXAM: LEFT KNEE - COMPLETE 4+ VIEW COMPARISON:  None. FINDINGS: There is tricompartmental degenerative joint disease the left knee primarily involving the medial compartment where there is more loss of joint space and sclerosis with spurring. No acute fracture is seen. There may be a small joint effusion present. Also there is faint chondrocalcinosis noted which may indicate CPPD arthropathy. IMPRESSION: 1. Tricompartmental degenerative joint disease primarily involving the medial compartment. 2. Tiny amount of left joint fluid. 3. Chondrocalcinosis.  Question CPPD arthropathy. Electronically Signed   By: Ivar Drape M.D.   On: 05/05/2017 15:29    Review of Systems  Constitutional: Negative for weight loss.  HENT: Negative for ear discharge, ear pain, hearing loss and tinnitus.   Eyes: Negative for blurred vision, double vision, photophobia and pain.  Respiratory: Negative for cough, sputum production and shortness of breath.   Cardiovascular: Negative for chest pain.  Gastrointestinal: Negative for abdominal pain, nausea and vomiting.  Genitourinary: Negative for dysuria, flank pain, frequency and urgency.  Musculoskeletal: Positive for joint pain (Left knee). Negative for back pain, falls, myalgias and neck pain.  Neurological: Negative for dizziness, tingling, sensory  change, focal weakness, loss of consciousness and headaches.  Endo/Heme/Allergies: Does not bruise/bleed easily.  Psychiatric/Behavioral: Negative for depression, memory loss and substance abuse. The patient is not nervous/anxious.    Blood pressure (!) 164/72, pulse 72, temperature 98.1 F (36.7 C), temperature source Oral, resp. rate 19, height '5\' 8"'  (1.727 m), weight 95.8 kg (211 lb 4.8 oz), SpO2 99 %. Physical Exam  Constitutional: He appears well-developed and well-nourished. No distress.  HENT:  Head: Normocephalic.  Neck: Normal range of motion.  Cardiovascular: Normal rate and regular rhythm.   Respiratory: Effort normal. No respiratory distress.  Musculoskeletal:  LLE No traumatic wounds, ecchymosis, or rash  Moderate diffuse TTP knee, esp joint line  No ankle effusion, moderate knee effusion  Knee stable to varus/ valgus and anterior/posterior stress but limited by pain. Flex/ext 105-165  Sens SPN, TN intact, DPN paresthetic but = bilaterally  Motor EHL, ext, flex, evers 5/5  DP 2+, PT 1+, No significant edema   Neurological: He is alert.  Skin: Skin is warm and dry. He is not diaphoretic.  Psychiatric: He has a normal mood and affect. His behavior is normal.    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 Will aspirate and send fluid for GS, C&S, and crystal analysis. Will plan to inject analgesics. Steroid injection only if  fluid is clear. Please keep NPO for now in case GS comes back positive for organisms.  Patient has had gout numerous times before including in the knee and feels this is gout.  He is improved after the aspiration from Hilbert Odor PA-C  I agree with the above assessment and plan.  Initial aspirate is neg for organisms.  Will follow   Lisette Abu, PA-C Orthopedic Surgery [phone] 05/06/2017, 1:25 PM

## 2017-05-06 NOTE — Progress Notes (Signed)
PROGRESS NOTE    Johnny [name]  WUJ:[id number] DOB: 1933-01-14 DOA: 04/24/2017 PCP: Darci Needle, MD   Chief Complaint  Patient presents with  . N/V    Brief Narrative:  Johnny [name]  is a 83 y.o. male, w hypertension, hyperlipidemia, Dm2,  CVA, macular degeneration, glaucoma apparently c/o vertigo. He was found to have a CVA.  While work up was on-going for this, he developed a fever.  Found to have Pseudomonas bacteremia source was urine-- grew pseudomonas and klebsiella.  Tee done but loop on hold for bacteremia.    Assessment & Plan   Acute CVA -CT head showed no acute abnormality -MRI brain showed scattered punctate infarcts and cerebral hemispheres and larger 10 mm focus in right cerebellum around old right cerebellum infarct -MRA: Nonvisualized right posterior-inferior cerebellar artery seen with artifact, low flow or occlusion. Moderate stenosis of anterior posterior circulation compatible with atherosclerosis. 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 Bilateral 1-39% ICA stenosis. Vertebral artery flow is antegrade -Echocardiogram: EF 55-60%, grade 1 diastolic dysfunction, moderate LVH, status post aVR with mean gradient of 15 mmHg -Hemoglobin A1c 7, LDL 113 -Neurology consulted and appreciated -PT/OT recommended SNF -Continue aspirin 325 mg - atorvastatin: Patient states he had been receiving injections up until one year ago for his cholesterol. -s/p TEE: No evidence for vegetation or an intracardiac thrombus. Positive bubble study. -loop on hold until complete treatment for bacteremia.   Left knee pain, x ray no significant effusion/  Continue with colchicine.  Ortho consulted for evaluation,   Leukocytosis -Stable at 14.  -on abx for pseudomonas  Essential hypertension -treat as needed   pseudomonas bacteremia/UTI -urine culture: pseudomonas/klebsiella -check x ray normal -blood  cultures-- pseudomonas -- repeated on 10/23 now that patient has ben on abx for 48 hours -flu swab negative Repeated blood culture 10-23 no growth.  Discussed with ID tx for 7 to 10 days with cipro/levaqun.   Diabetes mellitus, type II -Continue Lantus, insulin sliding scale, CBG monitoring -Hemoglobin A1c 7  Chronic kidney disease, stage III; 1.8---1.9 -Creatinine baseline, continue monitor BMP  DVT Prophylaxis  SCDs  Code Status: Full  Family Communication: None at bedside  Disposition Plan: SNF when work up complete- now evaluation of left knee pain   Consultants Neurology Cards (TEE)  Antibiotics   Anti-infectives    Start     Dose/Rate Route Frequency Ordered Stop   05/01/17 1100  ceFEPIme (MAXIPIME) 2 g in dextrose 5 % 50 mL IVPB     2 g 100 mL/hr over 30 Minutes Intravenous Every 24 hours 05/01/17 1032        Subjective:   Still having left knee pain, 7/10    Objective:   Vitals:   05/06/17 0039 05/06/17 0500 05/06/17 0531 05/06/17 1254  BP: (!) 147/68  (!) 164/75 (!) 164/72  Pulse: 68  67 72  Resp: 18  20 19   Temp: 99.2 F (37.3 C)  99.6 F (37.6 C) 98.1 F (36.7 C)  TempSrc: Oral  Oral Oral  SpO2: 96%  97% 99%  Weight:  95.8 kg (211 lb 4.8 oz)    Height:        Intake/Output Summary (Last 24 hours) at 05/06/17 1354 Last data filed at 05/05/17 1833  Gross per 24 hour  Intake              360 ml  Output  800 ml  Net             -440 ml   Filed Weights   05/04/17 0448 05/05/17 0427 05/06/17 0500  Weight: 98.4 kg (216 lb 14.4 oz) 99.5 kg (219 lb 6.4 oz) 95.8 kg (211 lb 4.8 oz)    Exam  General: up in chair, legally blind   Cardiovascular:  S 1, S 2 RRR  Respiratory: CAT   Abdomen: BS present, soft, nt  Neuro exam; upper extremity motor strength 5/5 right LE 5/5, left with limitation due to severe knee pain.     Data Reviewed: I have personally reviewed following labs and imaging studies  CBC:  Recent Labs Lab  05/02/17 0312 05/03/17 0015 05/05/17 0616 05/06/17 0856  WBC 31.9* 20.0* 14.4* 14.8*  HGB 14.3 15.5 14.5 14.9  HCT 40.1 44.1 41.4 43.1  MCV 84.6 85.3 84.1 85.0  PLT 193 207 257 283   Basic Metabolic Panel:  Recent Labs Lab 05/02/17 0312 05/03/17 0015 05/05/17 0616 05/06/17 0856  NA 132* 133* 135 136  K 4.0 4.2 3.7 4.0  CL 101 103 106 105  CO2 21* 22 19* 22  GLUCOSE 254* 190* 114* 121*  BUN 41* 40* 27* 28*  CREATININE 2.76* 2.36* 1.86* 2.13*  CALCIUM 9.2 9.4 9.2 9.6   GFR: Estimated Creatinine Clearance: 30 mL/min (A) (by C-G formula based on SCr of 2.13 mg/dL (H)). Liver Function Tests: No results for input(s): AST, ALT, ALKPHOS, BILITOT, PROT, ALBUMIN in the last 168 hours. No results for input(s): LIPASE, AMYLASE in the last 168 hours. No results for input(s): AMMONIA in the last 168 hours. Coagulation Profile: No results for input(s): INR, PROTIME in the last 168 hours. Cardiac Enzymes: No results for input(s): CKTOTAL, CKMB, CKMBINDEX, TROPONINI in the last 168 hours. BNP (last 3 results) No results for input(s): PROBNP in the last 8760 hours. HbA1C: No results for input(s): HGBA1C in the last 72 hours. CBG:  Recent Labs Lab 05/05/17 1123 05/05/17 1623 05/05/17 2139 05/06/17 0616 05/06/17 1129  GLUCAP 138* 151* 132* 92 170*   Lipid Profile: No results for input(s): CHOL, HDL, LDLCALC, TRIG, CHOLHDL, LDLDIRECT in the last 72 hours. Thyroid Function Tests: No results for input(s): TSH, T4TOTAL, FREET4, T3FREE, THYROIDAB in the last 72 hours. Anemia Panel: No results for input(s): VITAMINB12, FOLATE, FERRITIN, TIBC, IRON, RETICCTPCT in the last 72 hours. Urine analysis:    Component Value Date/Time   COLORURINE YELLOW 04/30/2017 1731   APPEARANCEUR CLOUDY (A) 04/30/2017 1731   LABSPEC 1.016 04/30/2017 1731   PHURINE 5.0 04/30/2017 1731   GLUCOSEU 50 (A) 04/30/2017 1731   HGBUR SMALL (A) 04/30/2017 1731   BILIRUBINUR NEGATIVE 04/30/2017 1731    KETONESUR NEGATIVE 04/30/2017 1731   PROTEINUR 100 (A) 04/30/2017 1731   UROBILINOGEN 0.2 01/02/2014 2236   NITRITE NEGATIVE 04/30/2017 1731   LEUKOCYTESUR LARGE (A) 04/30/2017 1731    ) Recent Results (from the past 240 hour(s))  Culture, blood (Routine X 2) w Reflex to ID Panel     Status: Abnormal   Collection Time: 04/30/17 12:02 PM  Result Value Ref Range Status   Specimen Description BLOOD LEFT ANTECUBITAL  Final   Special Requests   Final    BOTTLES DRAWN AEROBIC ONLY Blood Culture adequate volume   Culture  Setup Time   Final    GRAM NEGATIVE RODS AEROBIC BOTTLE ONLY CRITICAL RESULT CALLED TO, READ BACK BY AND VERIFIED WITH: A MASTERS,PHARMD AT 1011 05/01/17 BY L BENFIELD  Culture PSEUDOMONAS AERUGINOSA (A)  Final   Report Status 05/03/2017 FINAL  Final   Organism ID, Bacteria PSEUDOMONAS AERUGINOSA  Final      Susceptibility   Pseudomonas aeruginosa - MIC*    CEFTAZIDIME 2 SENSITIVE Sensitive     CIPROFLOXACIN <=0.25 SENSITIVE Sensitive     GENTAMICIN <=1 SENSITIVE Sensitive     IMIPENEM 2 SENSITIVE Sensitive     PIP/TAZO 8 SENSITIVE Sensitive     CEFEPIME <=1 SENSITIVE Sensitive     * PSEUDOMONAS AERUGINOSA  Blood Culture ID Panel (Reflexed)     Status: Abnormal   Collection Time: 04/30/17 12:02 PM  Result Value Ref Range Status   Enterococcus species NOT DETECTED NOT DETECTED Final   Listeria monocytogenes NOT DETECTED NOT DETECTED Final   Staphylococcus species NOT DETECTED NOT DETECTED Final   Staphylococcus aureus NOT DETECTED NOT DETECTED Final   Streptococcus species NOT DETECTED NOT DETECTED Final   Streptococcus agalactiae NOT DETECTED NOT DETECTED Final   Streptococcus pneumoniae NOT DETECTED NOT DETECTED Final   Streptococcus pyogenes NOT DETECTED NOT DETECTED Final   Acinetobacter baumannii NOT DETECTED NOT DETECTED Final   Enterobacteriaceae species NOT DETECTED NOT DETECTED Final   Enterobacter cloacae complex NOT DETECTED NOT DETECTED Final    Escherichia coli NOT DETECTED NOT DETECTED Final   Klebsiella oxytoca NOT DETECTED NOT DETECTED Final   Klebsiella pneumoniae NOT DETECTED NOT DETECTED Final   Proteus species NOT DETECTED NOT DETECTED Final   Serratia marcescens NOT DETECTED NOT DETECTED Final   Carbapenem resistance NOT DETECTED NOT DETECTED Final   Haemophilus influenzae NOT DETECTED NOT DETECTED Final   Neisseria meningitidis NOT DETECTED NOT DETECTED Final   Pseudomonas aeruginosa DETECTED (A) NOT DETECTED Final    Comment: CRITICAL RESULT CALLED TO, READ BACK BY AND VERIFIED WITH: A MASTERS,PHARMD AT 1011 05/01/17 BY L BENFIELD    Candida albicans NOT DETECTED NOT DETECTED Final   Candida glabrata NOT DETECTED NOT DETECTED Final   Candida krusei NOT DETECTED NOT DETECTED Final   Candida parapsilosis NOT DETECTED NOT DETECTED Final   Candida tropicalis NOT DETECTED NOT DETECTED Final  Culture, blood (Routine X 2) w Reflex to ID Panel     Status: None   Collection Time: 04/30/17 12:07 PM  Result Value Ref Range Status   Specimen Description BLOOD RIGHT ANTECUBITAL  Final   Special Requests IN PEDIATRIC BOTTLE Blood Culture adequate volume  Final   Culture NO GROWTH 5 DAYS  Final   Report Status 05/05/2017 FINAL  Final  Culture, Urine     Status: Abnormal   Collection Time: 05/01/17 12:16 PM  Result Value Ref Range Status   Specimen Description URINE, CLEAN CATCH  Final   Special Requests NONE  Final   Culture (A)  Final    >=100,000 COLONIES/mL PSEUDOMONAS AERUGINOSA >=100,000 COLONIES/mL KLEBSIELLA PNEUMONIAE    Report Status 05/04/2017 FINAL  Final   Organism ID, Bacteria PSEUDOMONAS AERUGINOSA (A)  Final   Organism ID, Bacteria KLEBSIELLA PNEUMONIAE (A)  Final      Susceptibility   Klebsiella pneumoniae - MIC*    AMPICILLIN >=32 RESISTANT Resistant     CEFAZOLIN <=4 SENSITIVE Sensitive     CEFTRIAXONE <=1 SENSITIVE Sensitive     CIPROFLOXACIN <=0.25 SENSITIVE Sensitive     GENTAMICIN <=1 SENSITIVE  Sensitive     IMIPENEM <=0.25 SENSITIVE Sensitive     NITROFURANTOIN 64 INTERMEDIATE Intermediate     TRIMETH/SULFA <=20 SENSITIVE Sensitive     AMPICILLIN/SULBACTAM 4 SENSITIVE  Sensitive     PIP/TAZO <=4 SENSITIVE Sensitive     Extended ESBL NEGATIVE Sensitive     * >=100,000 COLONIES/mL KLEBSIELLA PNEUMONIAE   Pseudomonas aeruginosa - MIC*    CEFTAZIDIME 4 SENSITIVE Sensitive     CIPROFLOXACIN <=0.25 SENSITIVE Sensitive     GENTAMICIN <=1 SENSITIVE Sensitive     IMIPENEM 2 SENSITIVE Sensitive     PIP/TAZO 8 SENSITIVE Sensitive     CEFEPIME 2 SENSITIVE Sensitive     * >=100,000 COLONIES/mL PSEUDOMONAS AERUGINOSA  Culture, blood (Routine X 2) w Reflex to ID Panel     Status: None (Preliminary result)   Collection Time: 05/03/17 12:15 AM  Result Value Ref Range Status   Specimen Description BLOOD LEFT ANTECUBITAL  Final   Special Requests   Final    BOTTLES DRAWN AEROBIC AND ANAEROBIC Blood Culture adequate volume   Culture NO GROWTH 3 DAYS  Final   Report Status PENDING  Incomplete  Culture, blood (Routine X 2) w Reflex to ID Panel     Status: None (Preliminary result)   Collection Time: 05/03/17 12:15 AM  Result Value Ref Range Status   Specimen Description BLOOD LEFT HAND  Final   Special Requests   Final    BOTTLES DRAWN AEROBIC AND ANAEROBIC Blood Culture adequate volume   Culture NO GROWTH 3 DAYS  Final   Report Status PENDING  Incomplete      Radiology Studies: Dg Knee Complete 4 Views Left  Result Date: 05/05/2017 CLINICAL DATA:  Left knee pain for 5 days, no acute injury EXAM: LEFT KNEE - COMPLETE 4+ VIEW COMPARISON:  None. FINDINGS: There is tricompartmental degenerative joint disease the left knee primarily involving the medial compartment where there is more loss of joint space and sclerosis with spurring. No acute fracture is seen. There may be a small joint effusion present. Also there is faint chondrocalcinosis noted which may indicate CPPD arthropathy.  IMPRESSION: 1. Tricompartmental degenerative joint disease primarily involving the medial compartment. 2. Tiny amount of left joint fluid. 3. Chondrocalcinosis.  Question CPPD arthropathy. Electronically Signed   By: Dwyane DeePaul  Barry M.D.   On: 05/05/2017 15:29     Scheduled Meds: . allopurinol  200 mg Oral QHS  . aspirin  300 mg Rectal Daily   Or  . aspirin  325 mg Oral Daily  . atorvastatin  20 mg Oral q1800  . bupivacaine  10 mL Infiltration Once  . colchicine  0.6 mg Oral Daily  . diclofenac sodium  2 g Topical QID  . docusate sodium  100 mg Oral BID  . ferrous sulfate  325 mg Oral BID  . folic acid  1 mg Oral Daily  . furosemide  40 mg Oral Daily  . hydrALAZINE  50 mg Oral TID  . insulin aspart  0-5 Units Subcutaneous QHS  . insulin aspart  0-9 Units Subcutaneous TID WC  . insulin glargine  35 Units Subcutaneous BID  . latanoprost  1 drop Both Eyes QHS  . lidocaine (PF)  5 mL Other Once  . methylPREDNISolone acetate  40 mg Intra-articular Once  . metoprolol tartrate  50 mg Oral BID  . pantoprazole  40 mg Oral Daily  . senna  1 tablet Oral QHS  . sodium chloride flush  3 mL Intravenous Q12H   Continuous Infusions: . sodium chloride    . ceFEPime (MAXIPIME) IV Stopped (05/06/17 1135)     LOS: 9 days   Time Spent in minutes   30  minutes  Regalado, Belkys A D.O. on 05/06/2017 at 1:54 PM   [facility] Office  [phone]

## 2017-05-06 NOTE — Progress Notes (Signed)
Recalled regarding loop implant s/p cryptogenic stroke.  The patient is being treated for bacteremia, in d/w medicine MD yesterday planned to discharge and complete out patient PO antibiotics.  Reviewed with Dr. Elberta Fortisamnitz, would prefer to have antibiotic course fully completed prior to implant.  I have made out patient consultation appointment with Dr. Elberta Fortisamnitz to discuss loop implant and plan.  Francis Dowseenee Yousra Ivens, PA-C

## 2017-05-06 NOTE — Progress Notes (Addendum)
OT Cancellation Note  Patient Details Name: Johnny [name] MRN: [id number] DOB: 05-Mar-1933   Cancelled Treatment:    Reason Eval/Treat Not Completed: Medical issues which prohibited therapy; spoke with RN, Pt will be having knee aspiration soon, requesting OT hold tx at this time. Will follow up as schedule permits.  Johnny [name], OT Pager [phone] 05/06/2017   Johnny [name] 05/06/2017, 2:22 PM

## 2017-05-06 NOTE — Care Management Note (Signed)
Case Management Note  Patient Details  Name: Johnny FellingSolomon B Bellanger MRN: [id number] Date of Birth: [birth year]  Subjective/Objective:                    Action/Plan: Plan is SNF when medically ready. CM following.  Expected Discharge Date:  04/27/17               Expected Discharge Plan:  Skilled Nursing Facility  In-House Referral:  Clinical Social Work  Discharge planning Services  CM Consult  Post Acute Care Choice:    Choice offered to:     DME Arranged:    DME Agency:     HH Arranged:    HH Agency:     Status of Service:  In process, will continue to follow  If discussed at Long Length of Stay Meetings, dates discussed:    Additional Comments:  Kermit BaloKelli F Lisha Vitale, RN 05/06/2017, 2:51 PM

## 2017-05-06 NOTE — Progress Notes (Signed)
Physical Therapy Treatment Patient Details Name: Johnny [name] MRN: [id number] DOB: Oct 27, 1932 Today's Date: 05/06/2017    History of Present Illness Tacari Repass  is a 83 y.o. male, w hypertension, hyperlipidemia, Dm2,  CVA, macular degeneration, glaucoma apparently c/o vertigo; CT head neg, MRI obtained on 04/26/17 revealed scattered punctate foci of acute ischemia within both cerebral hemispheres and the right cerebellum; largest focus was in the medial right cerebellum, Also seen was an old right cerebellar infarct and findings of chronic microvascular ischemia; Pt transferred from [place] to [place] on 10/17    PT Comments    Patient limited today by Lt knee pain.  Did perform therapeutic exercises. Will continue to follow.   Follow Up Recommendations  SNF     Equipment Recommendations  None recommended by PT    Recommendations for Other Services       Precautions / Restrictions Precautions Precautions: Fall Precaution Comments: legally blind Restrictions Weight Bearing Restrictions: No    Mobility  Bed Mobility               General bed mobility comments: Attempted, but patient reports pain increases too much in Lt knee with movement.  Declined mobility.  Did agree to try bed exercises.  Transfers                    Ambulation/Gait                 Stairs            Wheelchair Mobility    Modified Rankin (Stroke Patients Only) Modified Rankin (Stroke Patients Only) Pre-Morbid Rankin Score: Slight disability Modified Rankin: Moderately severe disability     Balance                                            Cognition Arousal/Alertness: Awake/alert Behavior During Therapy: WFL for tasks assessed/performed;Flat affect Overall Cognitive Status: Within Functional Limits for tasks assessed                                        Exercises General Exercises - Lower Extremity Ankle  Circles/Pumps: AROM;Both;10 reps;Supine Quad Sets: AROM;Right;5 reps;Supine;Limitations Quad Sets Limitations: Attempted on Lt but unable due to pain Short Arc Quad: AROM;Right;5 reps;Supine;Limitations Short Arc Quad Limitations: Attempted on Lt but unable due to pain Heel Slides: AROM;Right;5 reps;Supine;Limitations Heel Slides Limitations: Able to attempt x2 on LLE, but limited by pain Hip ABduction/ADduction: AROM;Right;10 reps;Supine Straight Leg Raises: AROM;Right;5 reps;Supine    General Comments        Pertinent Vitals/Pain Pain Assessment: 0-10 Pain Score: 9  Pain Location: Lt knee Pain Descriptors / Indicators: Aching;Grimacing;Guarding;Sharp Pain Intervention(s): Limited activity within patient's tolerance;Monitored during session    Home Living                      Prior Function            PT Goals (current goals can now be found in the care plan section) Acute Rehab PT Goals Patient Stated Goal: return to walking and prior level of function Progress towards PT goals: Not progressing toward goals - comment (due to Lt knee pain)    Frequency    Min 3X/week  PT Plan Current plan remains appropriate    Co-evaluation              AM-PAC PT "6 Clicks" Daily Activity  Outcome Measure  Difficulty turning over in bed (including adjusting bedclothes, sheets and blankets)?: A Little Difficulty moving from lying on back to sitting on the side of the bed? : A Little Difficulty sitting down on and standing up from a chair with arms (e.g., wheelchair, bedside commode, etc,.)?: Unable Help needed moving to and from a bed to chair (including a wheelchair)?: A Lot Help needed walking in hospital room?: A Lot Help needed climbing 3-5 steps with a railing? : Total 6 Click Score: 12    End of Session   Activity Tolerance: Patient limited by pain Patient left: in bed   PT Visit Diagnosis: Other abnormalities of gait and mobility (R26.89);Muscle  weakness (generalized) (M62.81);Pain Pain - Right/Left: Left Pain - part of body: Knee     Time: [id number] PT Time Calculation (min) (ACUTE ONLY): 11 min  Charges:  $Therapeutic Exercise: 8-22 mins                    G Codes:       Durenda HurtSusan H. Renaldo Fiddleravis, PT, [facility] Acute Rehab Services Pager [phone]    Vena AustriaSusan H Deloyd Handy 05/06/2017, 11:39 AM

## 2017-05-07 LAB — GLUCOSE, CAPILLARY
GLUCOSE-CAPILLARY: 204 mg/dL — AB (ref 65–99)
Glucose-Capillary: 250 mg/dL — ABNORMAL HIGH (ref 65–99)

## 2017-05-07 MED ORDER — LEVOFLOXACIN 750 MG PO TABS: 750.0000 mg | ORAL_TABLET | ORAL | 0 refills | Status: AC

## 2017-05-07 MED ORDER — INSULIN GLARGINE 100 UNIT/ML ~~LOC~~ SOLN: 35.0000 [IU] | Freq: Two times a day (BID) | SUBCUTANEOUS | 11 refills | Status: DC

## 2017-05-07 MED ORDER — ASPIRIN 325 MG PO TABS: 325.0000 mg | ORAL_TABLET | Freq: Every day | ORAL | 0 refills | Status: DC

## 2017-05-07 MED ORDER — PANTOPRAZOLE SODIUM 40 MG PO TBEC: 40.0000 mg | DELAYED_RELEASE_TABLET | Freq: Every day | ORAL | 0 refills | Status: DC

## 2017-05-07 MED ORDER — COLCHICINE 0.6 MG PO TABS: 0.6000 mg | ORAL_TABLET | Freq: Every day | ORAL | 0 refills | Status: DC

## 2017-05-07 MED ORDER — DICLOFENAC SODIUM 1 % TD GEL: 2.0000 g | Freq: Four times a day (QID) | TRANSDERMAL | 0 refills | Status: DC

## 2017-05-07 MED ORDER — ACETAMINOPHEN 325 MG PO TABS: 650.0000 mg | ORAL_TABLET | Freq: Four times a day (QID) | ORAL | 0 refills | Status: DC | PRN

## 2017-05-07 MED ORDER — ATORVASTATIN CALCIUM 20 MG PO TABS: 20.0000 mg | ORAL_TABLET | Freq: Every day | ORAL | 0 refills | Status: DC

## 2017-05-07 NOTE — Clinical Social Work Placement (Signed)
Nurse to call report to [phone], Room 704    CLINICAL SOCIAL WORK PLACEMENT  NOTE  Date:  05/07/2017  Patient Details  Name: Johnny [name] MRN: [id number] Date of Birth: 08/02/1932  Clinical Social Work is seeking post-discharge placement for this patient at the Skilled  Nursing Facility level of care (*CSW will initial, date and re-position this form in  chart as items are completed):  Yes   Patient/family provided with [facility] Clinical Social Work Department's list of facilities offering this level of care within the geographic area requested by the patient (or if unable, by the patient's family).  Yes   Patient/family informed of their freedom to choose among providers that offer the needed level of care, that participate in Medicare, Medicaid or managed care program needed by the patient, have an available bed and are willing to accept the patient.  Yes   Patient/family informed of [facility]'s ownership interest in [facility] and [facility], as well as of the fact that they are under no obligation to receive care at these facilities.  PASRR submitted to EDS on 04/29/17     PASRR number received on       Existing PASRR number confirmed on       FL2 transmitted to all facilities in geographic area requested by pt/family on       FL2 transmitted to all facilities within larger geographic area on 04/29/17     Patient informed that his/her managed care company has contracts with or will negotiate with certain facilities, including the following:        Yes   Patient/family informed of bed offers received.  [facility]     Physician recommends and patient chooses bed at      Patient to [facility] on 05/07/17.  Patient to be transferred to facility by [facility]     Patient family notified on 05/07/17 of transfer.  Name of family member notified:  Carney BernJean     PHYSICIAN       Additional Comment:     _______________________________________________ Baldemar LenisElizabeth M Devone Bonilla, LCSW 05/07/2017, 12:44 PM

## 2017-05-07 NOTE — Progress Notes (Signed)
The patient was seen on rounds this morning.  He is sitting up and eating breakfast.  He states that his knee is feeling better since the injection yesterday.  I reviewed his synovial fluid labs with him.  The cell count was benign without evidence of infection.  The Gram stain was negative.  He may continue activities as tolerated.  The culture should be followed until it is finally negative.  He understands.

## 2017-05-07 NOTE — Discharge Summary (Signed)
Physician Discharge Summary  ADONUS USELMAN ZOX:[id number] DOB: 10-Oct-1932 DOA: 04/24/2017  PCP: Darci Needle, MD  Admit date: 04/24/2017 Discharge date: 05/07/2017  Admitted From: Home  Disposition: SNF  Recommendations for Outpatient Follow-up:  1. Follow up with PCP in 1-2 weeks 2. Please obtain BMP/CBC in one week 3. Please follow up on the following pending results: Synovial fluid culture.  4. Monitor renal function and WBC.  5. After he finished antibiotics, he will need to follow up with EP for loop placement.     Discharge Condition; stable.  CODE STATUS: full code.  Diet recommendation: Heart Healthy   Brief/Interim Summary:   Brief Narrative:  SolomonAllenis a 83 y.o.male,w hypertension, hyperlipidemia, Dm2, CVA, macular degeneration, glaucoma apparently c/o vertigo. He was found to have a CVA.  While work up was on-going for this, he developed a fever.  Found to have Pseudomonas bacteremia source was urine-- grew pseudomonas and klebsiella.  Tee done but loop on hold for bacteremia.    Assessment & Plan   Acute CVA -CT head showed no acute abnormality -MRI brain showed scattered punctate infarcts and cerebral hemispheres and larger 10 mm focus in right cerebellum around old right cerebellum infarct -MRA: Nonvisualized right posterior-inferior cerebellar artery seen with artifact, low flow or occlusion. Moderate stenosis of anterior posterior circulation compatible with atherosclerosis. 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 Bilateral 1-39% ICA stenosis. Vertebral artery flow is antegrade -Echocardiogram: EF 55-60%, grade 1 diastolic dysfunction, moderate LVH, status post aVR with mean gradient of 15 mmHg -Hemoglobin A1c 7, LDL 113 -Neurology consulted and appreciated -PT/OT recommended SNF -Continue aspirin 325 mg - atorvastatin: Patient states he had been receiving injections up  until one year ago for his cholesterol. -s/p TEE: No evidence for vegetation or an intracardiac thrombus. Positive bubble study. -loop on hold until complete treatment for bacteremia.   Left knee pain, x ray no significant effusion/  Continue with colchicine.  Ortho consulted for evaluation,  underwent arthrocentesis. cynovial fluid consistent with inflammation. Preliminary culture result no growth. Final Synovial culture results  will need to be follow up.   Leukocytosis -Stable at 14.  -on abx for pseudomonas  Essential hypertension -treat as needed   pseudomonas bacteremia/UTI -urine culture: pseudomonas/klebsiella -check x ray normal -blood cultures-- pseudomonas -- repeated on 10/23 now that patient has ben on abx for 48 hours -flu swab negative Repeated blood culture 10-23 no growth.  Discussed with ID tx for 7 to 10 days with  levaqun.   Diabetes mellitus, type II -Continue Lantus, insulin sliding scale, CBG monitoring -Hemoglobin A1c 7 consider SSI at rehab.   Chronic kidney disease, stage III; 1.8---1.9 -Creatinine baseline, continue monitor BMP  DVT Prophylaxis  SCDs   Discharge Diagnoses:  Principal Problem:   Acute ischemic stroke (HCC) Active Problems:   HTN (hypertension)   Diabetes mellitus (HCC)   Vertigo   CKD (chronic kidney disease) stage 3, GFR 30-59 ml/min (HCC)   Stroke (cerebrum) (HCC)   Hyperlipidemia LDL goal <70   Legally blind   UTI (urinary tract infection)   Bacteremia   Leukocytosis    Discharge Instructions  Discharge Instructions    Ambulatory referral to Neurology    Complete by:  As directed    Pt will follow up with Dr. Roda Shutters at [facility] in about 2 months. Thanks.   Diet - low sodium heart healthy    Complete by:  As directed  Increase activity slowly    Complete by:  As directed      Allergies as of 05/07/2017      Reactions   Glyxambi [empagliflozin-linagliptin] Other (See Comments)   Lethargy, slurring speaking.     Heparin    Other reaction(s): Heparin-Induced Thrombocytopenia      Medication List    STOP taking these medications   aspirin EC 81 MG tablet Replaced by:  aspirin 325 MG tablet   HUMALOG KWIKPEN 100 UNIT/ML KiwkPen Generic drug:  insulin lispro   losartan 25 MG tablet Commonly known as:  COZAAR   omeprazole-sodium bicarbonate 40-1100 MG capsule Commonly known as:  ZEGERID Replaced by:  pantoprazole 40 MG tablet You also have another medication with the same name that you need to continue taking as instructed.   senna 8.6 MG Tabs tablet Commonly known as:  SENOKOT     TAKE these medications   acetaminophen 325 MG tablet Commonly known as:  TYLENOL Take 2 tablets (650 mg total) by mouth every 6 (six) hours as needed for mild pain (or Fever >/= 101).   allopurinol 100 MG tablet Commonly known as:  ZYLOPRIM Take 200 mg by mouth at bedtime.   amLODipine 10 MG tablet Commonly known as:  NORVASC Take 10 mg by mouth daily.   aspirin 325 MG tablet Take 1 tablet (325 mg total) by mouth daily. Replaces:  aspirin EC 81 MG tablet   atorvastatin 20 MG tablet Commonly known as:  LIPITOR Take 1 tablet (20 mg total) by mouth daily at 6 PM.   colchicine 0.6 MG tablet Take 1 tablet (0.6 mg total) by mouth daily.   docusate sodium 100 MG capsule Commonly known as:  COLACE Take 100 mg by mouth 2 (two) times daily.   ferrous sulfate 325 (65 FE) MG tablet Take 325 mg by mouth 2 (two) times daily.   folic acid 1 MG tablet Commonly known as:  FOLVITE Take 1 mg by mouth daily.   furosemide 40 MG tablet Commonly known as:  LASIX Take 40 mg by mouth daily.   hydrALAZINE 50 MG tablet Commonly known as:  APRESOLINE Take 50 mg by mouth 3 (three) times daily.   insulin glargine 100 UNIT/ML injection Commonly known as:  LANTUS Inject 0.35 mLs (35 Units total) into the skin 2 (two) times daily. What changed:  how much to take  additional instructions   latanoprost  0.005 % ophthalmic solution Commonly known as:  XALATAN Place 1 drop into both eyes at bedtime.   levofloxacin 750 MG tablet Commonly known as:  LEVAQUIN Take 1 tablet (750 mg total) by mouth every other day.   metoprolol tartrate 50 MG tablet Commonly known as:  LOPRESSOR Take 50 mg by mouth 2 (two) times daily.   Omeprazole-Sodium Bicarbonate 20-1100 MG Caps capsule Commonly known as:  ZEGERID Take 1 capsule by mouth daily before breakfast. What changed:  Another medication with the same name was removed. Continue taking this medication, and follow the directions you see here.   pantoprazole 40 MG tablet Commonly known as:  PROTONIX Take 1 tablet (40 mg total) by mouth daily. Replaces:  omeprazole-sodium bicarbonate 40-1100 MG capsule       Contact information for follow-up providers    Marvel Plan, MD. Schedule an appointment as soon as possible for a visit in 6 week(s).   Specialty:  Neurology Contact information: [address] [place] [address] [phone]        Elberta Fortis,  Andree Coss, MD Follow up on 05/25/2017.   Specialty:  Cardiology Why:  2:00PM Contact information: [address] [place] [address] [phone]            Contact information for after-discharge care    Destination    [facility] Follow up.   Specialty:  Skilled Nursing Facility Contact information: [address] [place] [address] [phone]                 Allergies  Allergen Reactions  . Glyxambi [Empagliflozin-Linagliptin] Other (See Comments)    Lethargy, slurring speaking.   . Heparin     Other reaction(s): Heparin-Induced Thrombocytopenia    Consultations: Cardiology Neurology Ortho   Procedures/Studies: Dg Chest 2 View  Result Date: 04/30/2017 CLINICAL DATA:  Fever EXAM: CHEST  2 VIEW COMPARISON:  01/05/2015 FINDINGS: There is mild bilateral interstitial prominence. There is no focal  consolidation. There is no pleural effusion or pneumothorax. The heart and mediastinal contours are unremarkable. There is evidence of prior median sternotomy and aortic valve replacement. The osseous structures are unremarkable. IMPRESSION: No active cardiopulmonary disease. Electronically Signed   By: Elige Ko   On: 04/30/2017 15:39   Ct Head Wo Contrast  Result Date: 04/25/2017 CLINICAL DATA:  83 y/o  M; ataxia, stroke suspected. EXAM: CT HEAD WITHOUT CONTRAST TECHNIQUE: Contiguous axial images were obtained from the base of the skull through the vertex without intravenous contrast. COMPARISON:  01/02/2014 CT head FINDINGS: Brain: No evidence of acute infarction, hemorrhage, hydrocephalus, extra-axial collection or mass lesion/mass effect. Small chronic infarcts are present in right posterior caudate body, right cerebellar hemisphere, and right putamen. Stable moderate brain parenchymal volume loss and mild chronic microvascular ischemic changes. Vascular: Calcific atherosclerosis of carotid siphons. No hyperdense vessel identified. Skull: Normal. Negative for fracture or focal lesion. Sinuses/Orbits: No acute finding. Other: Bilateral intra-ocular lens replacement. IMPRESSION: 1. No acute intracranial abnormality identified. 2. Stable moderate brain parenchymal volume loss and mild chronic microvascular ischemic changes of the brain. 3. Interval small chronic infarction within the right superior cerebellar hemisphere. Electronically Signed   By: Mitzi Hansen M.D.   On: 04/25/2017 01:10   Mr Maxine Glenn Head Wo Contrast  Result Date: 04/28/2017 CLINICAL DATA:  Follow-up stroke. History of hypertension, hyperlipidemia, diabetes and stroke. EXAM: MRA HEAD WITHOUT CONTRAST TECHNIQUE: Angiographic images of the Circle of Willis were obtained using MRA technique without intravenous contrast. COMPARISON:  MRI of the head April 26, 2017 and MRA head October 07, 2011 FINDINGS: ANTERIOR CIRCULATION:  Normal flow related enhancement of the included cervical, petrous, cavernous and supraclinoid internal carotid arteries. Patent anterior communicating artery. 2 mm inferomedially directed aneurysm LEFT A1-2 junction. Patent anterior and middle cerebral arteries, including distal segments. Moderate tandem stenoses anterior middle cerebral artery's. No large vessel occlusion, high-grade stenosis. POSTERIOR CIRCULATION: Codominant vertebral artery's. No RIGHT posterior-inferior cerebellar artery flow related enhancement. Basilar artery is patent, with normal flow related enhancement of the remaining main branch vessels. Patent posterior cerebral arteries. Moderate bilateral posterior cerebral artery tandem stenoses. No high-grade stenosis,  aneurysm. ANATOMIC VARIANTS: None. Source images and MIP images were reviewed. IMPRESSION: 1. Nonvisualized RIGHT posterior- inferior cerebellar artery seen with artifact, slow flow or occlusion. CT angiogram of the head and neck may be more definitive. 2. Moderate stenoses of the anterior and posterior circulation compatible with atherosclerosis. 3. 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 Electronically Signed   By: Awilda Metro M.D.   On: 04/28/2017 15:35  Mr Brain Wo Contrast  Result Date: 04/26/2017 CLINICAL DATA:  Central vertigo EXAM: MRI HEAD WITHOUT CONTRAST TECHNIQUE: Multiplanar, multiecho pulse sequences of the brain and surrounding structures were obtained without intravenous contrast. COMPARISON:  Head CT 04/25/2017 Brain MRI 10/07/2011 FINDINGS: Brain: The midline structures are normal. There are scattered punctate foci of diffusion restriction within both cerebral hemispheres, more on the right than the left. The largest is located in the medial right cerebellum, measuring approximately 10 mm. There are other lesions in the left temporal lobe, right temporal lobe, right parietal white matter and right frontal lobe. Old right cerebellar infarct. Mild bilateral  periventricular T2 hyperintensity. No intraparenchymal hematoma or chronic microhemorrhage. Brain volume is normal for age without lobar predominant atrophy. The dura is normal and there is no extra-axial collection. Vascular: Major intracranial arterial and venous sinus flow voids are preserved. Skull and upper cervical spine: The visualized skull base, calvarium, upper cervical spine and extracranial soft tissues are normal. Sinuses/Orbits: No fluid levels or advanced mucosal thickening. No mastoid or middle ear effusion. Normal orbits. Internal Auditory Canals: There is no cerebellopontine angle mass. The cochleae and semicircular canals are normal. No focal abnormality along the course of the 7th and 8th cranial nerves. Normal porus acusticus and vestibular aqueduct bilaterally. IMPRESSION: 1. Scattered punctate foci of acute ischemia within both cerebral hemispheres and the right cerebellum. The largest focus is in the medial right cerebellum measures 10 mm. The distribution is most consistent with a central cardiac or aortic embolic source. No hemorrhage or mass effect. 2. Old right cerebellar infarct and findings of chronic microvascular ischemia. 3. Normal MRI of the internal auditory canals and inner ear structures. Electronically Signed   By: Deatra Robinson M.D.   On: 04/26/2017 20:58   US Renal  Result Date: 05/02/2017 CLINICAL DATA:  83 year old male with UTI. History of bilateral renal cysts. EXAM: RENAL / URINARY TRACT ULTRASOUND COMPLETE COMPARISON:  CT of the abdomen pelvis dated 09/06/2010 FINDINGS: Right Kidney: Length: 11.3 cm. There multiple cysts in the right kidney. The largest cyst is in the superior pole and measures 5.5 x 5.4 x 6.3 cm. This cyst appears to have an area of peripheral calcification with an apparent partially calcified septum. This likely corresponds to the cyst seen on the CT of 2012. Evaluation of the cyst is limited due to technique. Nonemergent MRI may provide better  characterization if clinically indicated. There is no hydronephrosis or echogenic stone. Left Kidney: Length: 11.7 cm. There is a 1.1 x 1.1 x 1.5 cm left renal interpolar cyst. No hydronephrosis or echogenic stone. Bladder: The urinary bladder is collapsed and not visualized. IMPRESSION: 1. No hydronephrosis or echogenic stone. 2. Probable bilateral renal cysts. Electronically Signed   By: Elgie Collard M.D.   On: 05/02/2017 22:43   Dg Knee Complete 4 Views Left  Result Date: 05/05/2017 CLINICAL DATA:  Left knee pain for 5 days, no acute injury EXAM: LEFT KNEE - COMPLETE 4+ VIEW COMPARISON:  None. FINDINGS: There is tricompartmental degenerative joint disease the left knee primarily involving the medial compartment where there is more loss of joint space and sclerosis with spurring. No acute fracture is seen. There may be a small joint effusion present. Also there is faint chondrocalcinosis noted which may indicate CPPD arthropathy. IMPRESSION: 1. Tricompartmental degenerative joint disease primarily involving the medial compartment. 2. Tiny amount of left joint fluid. 3. Chondrocalcinosis.  Question CPPD arthropathy. Electronically Signed   By: Dwyane Dee M.D.   On:  05/05/2017 15:29     Subjective: Knee pain improved/.   Discharge Exam: Vitals:   05/07/17 0400 05/07/17 0910  BP: (!) 168/88 (!) 150/70  Pulse: 63   Resp: 18 16  Temp: 97.7 F (36.5 C) 98.2 F (36.8 C)  SpO2: 93% 93%   Vitals:   05/06/17 1851 05/07/17 0100 05/07/17 0400 05/07/17 0910  BP: (!) 153/71 (!) 146/87 (!) 168/88 (!) 150/70  Pulse: 85 69 63   Resp: 20 20 18 16   Temp:  97.9 F (36.6 C) 97.7 F (36.5 C) 98.2 F (36.8 C)  TempSrc:  Oral Oral Oral  SpO2: 98% 93% 93% 93%  Weight:  96.3 kg (212 lb 6.4 oz)    Height:        General: Pt is alert, awake, not in acute distress Cardiovascular: RRR, S1/S2 +, no rubs, no gallops Respiratory: CTA bilaterally, no wheezing, no rhonchi Abdominal: Soft, NT, ND, bowel  sounds + Extremities: no edema, no cyanosis left knee improved pain     The results of significant diagnostics from this hospitalization (including imaging, microbiology, ancillary and laboratory) are listed below for reference.     Microbiology: Recent Results (from the past 240 hour(s))  Culture, blood (Routine X 2) w Reflex to ID Panel     Status: Abnormal   Collection Time: 04/30/17 12:02 PM  Result Value Ref Range Status   Specimen Description BLOOD LEFT ANTECUBITAL  Final   Special Requests   Final    BOTTLES DRAWN AEROBIC ONLY Blood Culture adequate volume   Culture  Setup Time   Final    GRAM NEGATIVE RODS AEROBIC BOTTLE ONLY CRITICAL RESULT CALLED TO, READ BACK BY AND VERIFIED WITH: A MASTERS,PHARMD AT 1011 05/01/17 BY L BENFIELD    Culture PSEUDOMONAS AERUGINOSA (A)  Final   Report Status 05/03/2017 FINAL  Final   Organism ID, Bacteria PSEUDOMONAS AERUGINOSA  Final      Susceptibility   Pseudomonas aeruginosa - MIC*    CEFTAZIDIME 2 SENSITIVE Sensitive     CIPROFLOXACIN <=0.25 SENSITIVE Sensitive     GENTAMICIN <=1 SENSITIVE Sensitive     IMIPENEM 2 SENSITIVE Sensitive     PIP/TAZO 8 SENSITIVE Sensitive     CEFEPIME <=1 SENSITIVE Sensitive     * PSEUDOMONAS AERUGINOSA  Blood Culture ID Panel (Reflexed)     Status: Abnormal   Collection Time: 04/30/17 12:02 PM  Result Value Ref Range Status   Enterococcus species NOT DETECTED NOT DETECTED Final   Listeria monocytogenes NOT DETECTED NOT DETECTED Final   Staphylococcus species NOT DETECTED NOT DETECTED Final   Staphylococcus aureus NOT DETECTED NOT DETECTED Final   Streptococcus species NOT DETECTED NOT DETECTED Final   Streptococcus agalactiae NOT DETECTED NOT DETECTED Final   Streptococcus pneumoniae NOT DETECTED NOT DETECTED Final   Streptococcus pyogenes NOT DETECTED NOT DETECTED Final   Acinetobacter baumannii NOT DETECTED NOT DETECTED Final   Enterobacteriaceae species NOT DETECTED NOT DETECTED Final    Enterobacter cloacae complex NOT DETECTED NOT DETECTED Final   Escherichia coli NOT DETECTED NOT DETECTED Final   Klebsiella oxytoca NOT DETECTED NOT DETECTED Final   Klebsiella pneumoniae NOT DETECTED NOT DETECTED Final   Proteus species NOT DETECTED NOT DETECTED Final   Serratia marcescens NOT DETECTED NOT DETECTED Final   Carbapenem resistance NOT DETECTED NOT DETECTED Final   Haemophilus influenzae NOT DETECTED NOT DETECTED Final   Neisseria meningitidis NOT DETECTED NOT DETECTED Final   Pseudomonas aeruginosa DETECTED (A) NOT DETECTED Final  Comment: CRITICAL RESULT CALLED TO, READ BACK BY AND VERIFIED WITH: A MASTERS,PHARMD AT 1011 05/01/17 BY L BENFIELD    Candida albicans NOT DETECTED NOT DETECTED Final   Candida glabrata NOT DETECTED NOT DETECTED Final   Candida krusei NOT DETECTED NOT DETECTED Final   Candida parapsilosis NOT DETECTED NOT DETECTED Final   Candida tropicalis NOT DETECTED NOT DETECTED Final  Culture, blood (Routine X 2) w Reflex to ID Panel     Status: None   Collection Time: 04/30/17 12:07 PM  Result Value Ref Range Status   Specimen Description BLOOD RIGHT ANTECUBITAL  Final   Special Requests IN PEDIATRIC BOTTLE Blood Culture adequate volume  Final   Culture NO GROWTH 5 DAYS  Final   Report Status 05/05/2017 FINAL  Final  Culture, Urine     Status: Abnormal   Collection Time: 05/01/17 12:16 PM  Result Value Ref Range Status   Specimen Description URINE, CLEAN CATCH  Final   Special Requests NONE  Final   Culture (A)  Final    >=100,000 COLONIES/mL PSEUDOMONAS AERUGINOSA >=100,000 COLONIES/mL KLEBSIELLA PNEUMONIAE    Report Status 05/04/2017 FINAL  Final   Organism ID, Bacteria PSEUDOMONAS AERUGINOSA (A)  Final   Organism ID, Bacteria KLEBSIELLA PNEUMONIAE (A)  Final      Susceptibility   Klebsiella pneumoniae - MIC*    AMPICILLIN >=32 RESISTANT Resistant     CEFAZOLIN <=4 SENSITIVE Sensitive     CEFTRIAXONE <=1 SENSITIVE Sensitive      CIPROFLOXACIN <=0.25 SENSITIVE Sensitive     GENTAMICIN <=1 SENSITIVE Sensitive     IMIPENEM <=0.25 SENSITIVE Sensitive     NITROFURANTOIN 64 INTERMEDIATE Intermediate     TRIMETH/SULFA <=20 SENSITIVE Sensitive     AMPICILLIN/SULBACTAM 4 SENSITIVE Sensitive     PIP/TAZO <=4 SENSITIVE Sensitive     Extended ESBL NEGATIVE Sensitive     * >=100,000 COLONIES/mL KLEBSIELLA PNEUMONIAE   Pseudomonas aeruginosa - MIC*    CEFTAZIDIME 4 SENSITIVE Sensitive     CIPROFLOXACIN <=0.25 SENSITIVE Sensitive     GENTAMICIN <=1 SENSITIVE Sensitive     IMIPENEM 2 SENSITIVE Sensitive     PIP/TAZO 8 SENSITIVE Sensitive     CEFEPIME 2 SENSITIVE Sensitive     * >=100,000 COLONIES/mL PSEUDOMONAS AERUGINOSA  Culture, blood (Routine X 2) w Reflex to ID Panel     Status: None (Preliminary result)   Collection Time: 05/03/17 12:15 AM  Result Value Ref Range Status   Specimen Description BLOOD LEFT ANTECUBITAL  Final   Special Requests   Final    BOTTLES DRAWN AEROBIC AND ANAEROBIC Blood Culture adequate volume   Culture NO GROWTH 4 DAYS  Final   Report Status PENDING  Incomplete  Culture, blood (Routine X 2) w Reflex to ID Panel     Status: None (Preliminary result)   Collection Time: 05/03/17 12:15 AM  Result Value Ref Range Status   Specimen Description BLOOD LEFT HAND  Final   Special Requests   Final    BOTTLES DRAWN AEROBIC AND ANAEROBIC Blood Culture adequate volume   Culture NO GROWTH 4 DAYS  Final   Report Status PENDING  Incomplete  Body fluid culture     Status: None (Preliminary result)   Collection Time: 05/06/17  2:24 PM  Result Value Ref Range Status   Specimen Description SYNOVIAL LEFT KNEE  Final   Special Requests NONE  Final   Gram Stain   Final    RARE WBC PRESENT, PREDOMINANTLY PMN NO ORGANISMS  SEEN    Culture NO GROWTH < 24 HOURS  Final   Report Status PENDING  Incomplete     Labs: BNP (last 3 results) No results for input(s): BNP in the last 8760 hours. Basic Metabolic  Panel:  Recent Labs Lab 05/02/17 0312 05/03/17 0015 05/05/17 0616 05/06/17 0856  NA 132* 133* 135 136  K 4.0 4.2 3.7 4.0  CL 101 103 106 105  CO2 21* 22 19* 22  GLUCOSE 254* 190* 114* 121*  BUN 41* 40* 27* 28*  CREATININE 2.76* 2.36* 1.86* 2.13*  CALCIUM 9.2 9.4 9.2 9.6   Liver Function Tests: No results for input(s): AST, ALT, ALKPHOS, BILITOT, PROT, ALBUMIN in the last 168 hours. No results for input(s): LIPASE, AMYLASE in the last 168 hours. No results for input(s): AMMONIA in the last 168 hours. CBC:  Recent Labs Lab 05/02/17 0312 05/03/17 0015 05/05/17 0616 05/06/17 0856  WBC 31.9* 20.0* 14.4* 14.8*  HGB 14.3 15.5 14.5 14.9  HCT 40.1 44.1 41.4 43.1  MCV 84.6 85.3 84.1 85.0  PLT 193 207 257 283   Cardiac Enzymes: No results for input(s): CKTOTAL, CKMB, CKMBINDEX, TROPONINI in the last 168 hours. BNP: Invalid input(s): POCBNP CBG:  Recent Labs Lab 05/05/17 2139 05/06/17 0616 05/06/17 1129 05/06/17 1649 05/07/17 0603  GLUCAP 132* 92 170* 131* 250*   D-Dimer No results for input(s): DDIMER in the last 72 hours. Hgb A1c No results for input(s): HGBA1C in the last 72 hours. Lipid Profile No results for input(s): CHOL, HDL, LDLCALC, TRIG, CHOLHDL, LDLDIRECT in the last 72 hours. Thyroid function studies No results for input(s): TSH, T4TOTAL, T3FREE, THYROIDAB in the last 72 hours.  Invalid input(s): FREET3 Anemia work up No results for input(s): VITAMINB12, FOLATE, FERRITIN, TIBC, IRON, RETICCTPCT in the last 72 hours. Urinalysis    Component Value Date/Time   COLORURINE YELLOW 04/30/2017 1731   APPEARANCEUR CLOUDY (A) 04/30/2017 1731   LABSPEC 1.016 04/30/2017 1731   PHURINE 5.0 04/30/2017 1731   GLUCOSEU 50 (A) 04/30/2017 1731   HGBUR SMALL (A) 04/30/2017 1731   BILIRUBINUR NEGATIVE 04/30/2017 1731   KETONESUR NEGATIVE 04/30/2017 1731   PROTEINUR 100 (A) 04/30/2017 1731   UROBILINOGEN 0.2 01/02/2014 2236   NITRITE NEGATIVE 04/30/2017 1731    LEUKOCYTESUR LARGE (A) 04/30/2017 1731   Sepsis Labs Invalid input(s): PROCALCITONIN,  WBC,  LACTICIDVEN Microbiology Recent Results (from the past 240 hour(s))  Culture, blood (Routine X 2) w Reflex to ID Panel     Status: Abnormal   Collection Time: 04/30/17 12:02 PM  Result Value Ref Range Status   Specimen Description BLOOD LEFT ANTECUBITAL  Final   Special Requests   Final    BOTTLES DRAWN AEROBIC ONLY Blood Culture adequate volume   Culture  Setup Time   Final    GRAM NEGATIVE RODS AEROBIC BOTTLE ONLY CRITICAL RESULT CALLED TO, READ BACK BY AND VERIFIED WITH: A MASTERS,PHARMD AT 1011 05/01/17 BY L BENFIELD    Culture PSEUDOMONAS AERUGINOSA (A)  Final   Report Status 05/03/2017 FINAL  Final   Organism ID, Bacteria PSEUDOMONAS AERUGINOSA  Final      Susceptibility   Pseudomonas aeruginosa - MIC*    CEFTAZIDIME 2 SENSITIVE Sensitive     CIPROFLOXACIN <=0.25 SENSITIVE Sensitive     GENTAMICIN <=1 SENSITIVE Sensitive     IMIPENEM 2 SENSITIVE Sensitive     PIP/TAZO 8 SENSITIVE Sensitive     CEFEPIME <=1 SENSITIVE Sensitive     * PSEUDOMONAS AERUGINOSA  Blood  Culture ID Panel (Reflexed)     Status: Abnormal   Collection Time: 04/30/17 12:02 PM  Result Value Ref Range Status   Enterococcus species NOT DETECTED NOT DETECTED Final   Listeria monocytogenes NOT DETECTED NOT DETECTED Final   Staphylococcus species NOT DETECTED NOT DETECTED Final   Staphylococcus aureus NOT DETECTED NOT DETECTED Final   Streptococcus species NOT DETECTED NOT DETECTED Final   Streptococcus agalactiae NOT DETECTED NOT DETECTED Final   Streptococcus pneumoniae NOT DETECTED NOT DETECTED Final   Streptococcus pyogenes NOT DETECTED NOT DETECTED Final   Acinetobacter baumannii NOT DETECTED NOT DETECTED Final   Enterobacteriaceae species NOT DETECTED NOT DETECTED Final   Enterobacter cloacae complex NOT DETECTED NOT DETECTED Final   Escherichia coli NOT DETECTED NOT DETECTED Final   Klebsiella oxytoca NOT  DETECTED NOT DETECTED Final   Klebsiella pneumoniae NOT DETECTED NOT DETECTED Final   Proteus species NOT DETECTED NOT DETECTED Final   Serratia marcescens NOT DETECTED NOT DETECTED Final   Carbapenem resistance NOT DETECTED NOT DETECTED Final   Haemophilus influenzae NOT DETECTED NOT DETECTED Final   Neisseria meningitidis NOT DETECTED NOT DETECTED Final   Pseudomonas aeruginosa DETECTED (A) NOT DETECTED Final    Comment: CRITICAL RESULT CALLED TO, READ BACK BY AND VERIFIED WITH: A MASTERS,PHARMD AT 1011 05/01/17 BY L BENFIELD    Candida albicans NOT DETECTED NOT DETECTED Final   Candida glabrata NOT DETECTED NOT DETECTED Final   Candida krusei NOT DETECTED NOT DETECTED Final   Candida parapsilosis NOT DETECTED NOT DETECTED Final   Candida tropicalis NOT DETECTED NOT DETECTED Final  Culture, blood (Routine X 2) w Reflex to ID Panel     Status: None   Collection Time: 04/30/17 12:07 PM  Result Value Ref Range Status   Specimen Description BLOOD RIGHT ANTECUBITAL  Final   Special Requests IN PEDIATRIC BOTTLE Blood Culture adequate volume  Final   Culture NO GROWTH 5 DAYS  Final   Report Status 05/05/2017 FINAL  Final  Culture, Urine     Status: Abnormal   Collection Time: 05/01/17 12:16 PM  Result Value Ref Range Status   Specimen Description URINE, CLEAN CATCH  Final   Special Requests NONE  Final   Culture (A)  Final    >=100,000 COLONIES/mL PSEUDOMONAS AERUGINOSA >=100,000 COLONIES/mL KLEBSIELLA PNEUMONIAE    Report Status 05/04/2017 FINAL  Final   Organism ID, Bacteria PSEUDOMONAS AERUGINOSA (A)  Final   Organism ID, Bacteria KLEBSIELLA PNEUMONIAE (A)  Final      Susceptibility   Klebsiella pneumoniae - MIC*    AMPICILLIN >=32 RESISTANT Resistant     CEFAZOLIN <=4 SENSITIVE Sensitive     CEFTRIAXONE <=1 SENSITIVE Sensitive     CIPROFLOXACIN <=0.25 SENSITIVE Sensitive     GENTAMICIN <=1 SENSITIVE Sensitive     IMIPENEM <=0.25 SENSITIVE Sensitive     NITROFURANTOIN 64  INTERMEDIATE Intermediate     TRIMETH/SULFA <=20 SENSITIVE Sensitive     AMPICILLIN/SULBACTAM 4 SENSITIVE Sensitive     PIP/TAZO <=4 SENSITIVE Sensitive     Extended ESBL NEGATIVE Sensitive     * >=100,000 COLONIES/mL KLEBSIELLA PNEUMONIAE   Pseudomonas aeruginosa - MIC*    CEFTAZIDIME 4 SENSITIVE Sensitive     CIPROFLOXACIN <=0.25 SENSITIVE Sensitive     GENTAMICIN <=1 SENSITIVE Sensitive     IMIPENEM 2 SENSITIVE Sensitive     PIP/TAZO 8 SENSITIVE Sensitive     CEFEPIME 2 SENSITIVE Sensitive     * >=100,000 COLONIES/mL PSEUDOMONAS AERUGINOSA  Culture, blood (  Routine X 2) w Reflex to ID Panel     Status: None (Preliminary result)   Collection Time: 05/03/17 12:15 AM  Result Value Ref Range Status   Specimen Description BLOOD LEFT ANTECUBITAL  Final   Special Requests   Final    BOTTLES DRAWN AEROBIC AND ANAEROBIC Blood Culture adequate volume   Culture NO GROWTH 4 DAYS  Final   Report Status PENDING  Incomplete  Culture, blood (Routine X 2) w Reflex to ID Panel     Status: None (Preliminary result)   Collection Time: 05/03/17 12:15 AM  Result Value Ref Range Status   Specimen Description BLOOD LEFT HAND  Final   Special Requests   Final    BOTTLES DRAWN AEROBIC AND ANAEROBIC Blood Culture adequate volume   Culture NO GROWTH 4 DAYS  Final   Report Status PENDING  Incomplete  Body fluid culture     Status: None (Preliminary result)   Collection Time: 05/06/17  2:24 PM  Result Value Ref Range Status   Specimen Description SYNOVIAL LEFT KNEE  Final   Special Requests NONE  Final   Gram Stain   Final    RARE WBC PRESENT, PREDOMINANTLY PMN NO ORGANISMS SEEN    Culture NO GROWTH < 24 HOURS  Final   Report Status PENDING  Incomplete     Time coordinating discharge: Over 30 minutes  SIGNED:   Alba Coryegalado, Haytham Maher A, MD  [facility] 05/07/2017, 10:36 AM Pager   If 7PM-7AM, please contact night-coverage [email or web address] Password [email or web address]

## 2017-05-07 NOTE — Progress Notes (Signed)
[facility] to [facility]. Phone went to voicemail both times.

## 2017-05-08 LAB — CULTURE, BLOOD (ROUTINE X 2)
CULTURE: NO GROWTH
Culture: NO GROWTH
SPECIAL REQUESTS: ADEQUATE
SPECIAL REQUESTS: ADEQUATE

## 2017-05-09 LAB — BODY FLUID CULTURE: CULTURE: NO GROWTH

## 2017-05-10 DIAGNOSIS — E11319 Type 2 diabetes mellitus with unspecified diabetic retinopathy without macular edema: Secondary | ICD-10-CM | POA: Insufficient documentation

## 2017-05-10 DIAGNOSIS — Z8739 Personal history of other diseases of the musculoskeletal system and connective tissue: Secondary | ICD-10-CM | POA: Insufficient documentation

## 2017-05-10 DIAGNOSIS — I2510 Atherosclerotic heart disease of native coronary artery without angina pectoris: Secondary | ICD-10-CM | POA: Insufficient documentation

## 2017-05-10 DIAGNOSIS — Z961 Presence of intraocular lens: Secondary | ICD-10-CM | POA: Insufficient documentation

## 2017-05-10 DIAGNOSIS — R011 Cardiac murmur, unspecified: Secondary | ICD-10-CM | POA: Insufficient documentation

## 2017-05-10 DIAGNOSIS — M109 Gout, unspecified: Secondary | ICD-10-CM | POA: Insufficient documentation

## 2017-05-10 DIAGNOSIS — K219 Gastro-esophageal reflux disease without esophagitis: Secondary | ICD-10-CM | POA: Insufficient documentation

## 2017-05-10 DIAGNOSIS — H3581 Retinal edema: Secondary | ICD-10-CM | POA: Insufficient documentation

## 2017-05-25 ENCOUNTER — Institutional Professional Consult (permissible substitution): Payer: Medicare Other | Admitting: Cardiology

## 2017-05-25 NOTE — Progress Notes (Deleted)
Electrophysiology Office Note   Date:  05/25/2017   ID:  Johnny FellingSolomon B Tech, DOB 28-Jan-1933, MRN [id number]  PCP:  Darci NeedleKohut, Walter, MD  Cardiologist:   Primary Electrophysiologist:  Ivionna Verley Jorja LoaMartin Izek Corvino, MD    No chief complaint on file.    History of Present Illness: Johnny [name] is a 83 y.o. male who is being seen today for the evaluation of CVA at the request of Darci NeedleKohut, Walter, MD. Presenting today for electrophysiology evaluation.  He has a history of hypertension, hyperlipidemia, type 2 diabetes, CVA, macular degeneration.  He presented to the hospital and was discharged on 05/07/17 with an acute CVA.  At the time, he was also found to have a Pseudomonas bacteremia.  Link monitor was not implanted at that time due to his bacteremia.  MRI of the brain showed scattered punctate infarcts in the cerebellar hemispheres and a 10 mm focus in the right cerebellum.    Today, he denies*** symptoms of palpitations, chest pain, shortness of breath, orthopnea, PND, lower extremity edema, claudication, dizziness, presyncope, syncope, bleeding, or neurologic sequela. The patient is tolerating medications without difficulties.    Past Medical History:  Diagnosis Date  . Aortic stenosis   . BPH (benign prostatic hypertrophy)   . Branch retinal vein occlusion 08/01/2013  . CAD (coronary artery disease)   . CVA (cerebral infarction)   . Diabetes mellitus (HCC)   . Diabetic retinopathy (HCC)   . Gait disturbance   . GERD (gastroesophageal reflux disease)   . Gout   . Heart murmur   . HTN (hypertension)   . Macular degeneration   . Macular degeneration   . Macular edema   . Paresthesia   . Pseudophakia of right eye   . Ptosis    Past Surgical History:  Procedure Laterality Date  . AORTIC VALVE REPLACEMENT  03/26/2013  . CATARACT EXTRACTION W/ INTRAOCULAR LENS IMPLANT  11/01/2013  . CATARACT EXTRACTION W/ INTRAOCULAR LENS IMPLANT Right 12/18/2009  . COLONOSCOPY W/ POLYPECTOMY    .  CORONARY ARTERY BYPASS GRAFT  03/26/2013  . EYE SURGERY  03/19/2016   Ocular tube shunt insertion  . PARS PLANA VITRECTOMY  03/19/2016  . PARS PLANA VITRECTOMY  11/08/2013  . PARS PLANA VITRECTOMY  08/02/2012  . PROSTATE SURGERY       Current Outpatient Medications  Medication Sig Dispense Refill  . acetaminophen (TYLENOL) 325 MG tablet Take 2 tablets (650 mg total) by mouth every 6 (six) hours as needed for mild pain (or Fever >/= 101). 30 tablet 0  . allopurinol (ZYLOPRIM) 100 MG tablet Take 200 mg by mouth at bedtime.     Marland Kitchen. amLODipine (NORVASC) 10 MG tablet Take 10 mg by mouth daily.    Marland Kitchen. aspirin 325 MG tablet Take 1 tablet (325 mg total) by mouth daily. 30 tablet 0  . atorvastatin (LIPITOR) 20 MG tablet Take 1 tablet (20 mg total) by mouth daily at 6 PM. 30 tablet 0  . colchicine 0.6 MG tablet Take 1 tablet (0.6 mg total) by mouth daily. 30 tablet 0  . docusate sodium (COLACE) 100 MG capsule Take 100 mg by mouth 2 (two) times daily.    . ferrous sulfate 325 (65 FE) MG tablet Take 325 mg by mouth 2 (two) times daily.  0  . folic acid (FOLVITE) 1 MG tablet Take 1 mg by mouth daily.     . furosemide (LASIX) 40 MG tablet Take 40 mg by mouth daily.     .Marland Kitchen  hydrALAZINE (APRESOLINE) 50 MG tablet Take 50 mg by mouth 3 (three) times daily.     . insulin glargine (LANTUS) 100 UNIT/ML injection Inject 0.35 mLs (35 Units total) into the skin 2 (two) times daily. 10 mL 11  . latanoprost (XALATAN) 0.005 % ophthalmic solution Place 1 drop into both eyes at bedtime.    . metoprolol (LOPRESSOR) 50 MG tablet Take 50 mg by mouth 2 (two) times daily.    Maxwell Caul Bicarbonate (ZEGERID) 20-1100 MG CAPS capsule Take 1 capsule by mouth daily before breakfast.    . pantoprazole (PROTONIX) 40 MG tablet Take 1 tablet (40 mg total) by mouth daily. 30 tablet 0   No current facility-administered medications for this visit.     Allergies:   Glyxambi [empagliflozin-linagliptin] and Heparin   Social  History:  The patient  reports that he has quit smoking. His smoking use included cigarettes. He has a 40.00 pack-year smoking history. he has never used smokeless tobacco. He reports that he does not drink alcohol or use drugs.   Family History:  The patient's family history includes Breast cancer in his sister; Lung cancer in his brother.    ROS:  Please see the history of present illness.   Otherwise, review of systems is positive for ***.   All other systems are reviewed and negative.    PHYSICAL EXAM: VS:  There were no vitals taken for this visit. , BMI There is no height or weight on file to calculate BMI. GEN: Well nourished, well developed, in no acute distress  HEENT: normal  Neck: no JVD, carotid bruits, or masses Cardiac: ***RRR; no murmurs, rubs, or gallops,no edema  Respiratory:  clear to auscultation bilaterally, normal work of breathing GI: soft, nontender, nondistended, + BS MS: no deformity or atrophy  Skin: warm and dry Neuro:  Strength and sensation are intact Psych: euthymic mood, full affect  EKG:  EKG is not ordered today. Personal review of the ekg ordered 04/25/17 shows sinus rhythm, incomplete left bundle branch block, PVC  Recent Labs: 04/26/2017: ALT 26 05/06/2017: BUN 28; Creatinine, Ser 2.13; Hemoglobin 14.9; Platelets 283; Potassium 4.0; Sodium 136    Lipid Panel     Component Value Date/Time   CHOL 177 04/29/2017 0517   TRIG 171 (H) 04/29/2017 0517   HDL 30 (L) 04/29/2017 0517   CHOLHDL 5.9 04/29/2017 0517   VLDL 34 04/29/2017 0517   LDLCALC 113 (H) 04/29/2017 0517     Wt Readings from Last 3 Encounters:  05/07/17 212 lb 6.4 oz (96.3 kg)  04/26/17 215 lb (97.5 kg)  01/02/14 196 lb 3.4 oz (89 kg)      Other studies Reviewed: Additional studies/ records that were reviewed today include: TEE 05/02/17  Review of the above records today demonstrates:  - Left ventricle: There was mild concentric hypertrophy. Systolic   function was normal.  The estimated ejection fraction was in the   range of 60% to 65%. Wall motion was normal; there were no   regional wall motion abnormalities. - Aortic valve: There was moderate stenosis. Mean gradient (S): 28   mm Hg. Peak gradient (S): 48 mm Hg. - Left atrium: No evidence of thrombus in the atrial cavity or   appendage. No evidence of thrombus in the atrial cavity or   appendage. - Right atrium: No evidence of thrombus in the atrial cavity or   appendage. - Atrial septum: There was a patent foramen ovale. - Pulmonary arteries: Systolic pressure was mildly  increased. PA   peak pressure: 39 mm Hg (S).   ASSESSMENT AND PLAN:  1.  ***    Current medicines are reviewed at length with the patient today.   The patient {ACTIONS; HAS/DOES NOT HAVE:19233} concerns regarding his medicines.  The following changes were made today:  {NONE DEFAULTED:18576::"none"}  Labs/ tests ordered today include: *** No orders of the defined types were placed in this encounter.    Disposition:   FU with Keya Wynes {gen number [id number]} {Days to years:10300}  Signed, Chandria Rookstool Jorja LoaMartin Ionia Schey, MD  05/25/2017 9:11 AM     [facility] [address] [place] [address] [phone] (office) [phone] (fax)

## 2017-06-16 ENCOUNTER — Encounter: Payer: Self-pay | Admitting: Podiatry

## 2017-06-16 ENCOUNTER — Ambulatory Visit (INDEPENDENT_AMBULATORY_CARE_PROVIDER_SITE_OTHER): Payer: Medicare Other | Admitting: Podiatry

## 2017-06-16 DIAGNOSIS — B351 Tinea unguium: Secondary | ICD-10-CM

## 2017-06-16 DIAGNOSIS — M79672 Pain in left foot: Secondary | ICD-10-CM | POA: Diagnosis not present

## 2017-06-16 DIAGNOSIS — M79671 Pain in right foot: Secondary | ICD-10-CM | POA: Diagnosis not present

## 2017-06-16 DIAGNOSIS — L270 Generalized skin eruption due to drugs and medicaments taken internally: Secondary | ICD-10-CM | POA: Diagnosis not present

## 2017-06-16 DIAGNOSIS — L600 Ingrowing nail: Secondary | ICD-10-CM

## 2017-06-16 NOTE — Progress Notes (Signed)
Subjective: 83 y.o. year old male patient presents using a walker accompanied by his wife complaining of painful nails.  Wife stated that he a stroke on 04/24/2017. He was hospitalized and followed by rehab facility. Stated that he had developed skin rash in both feet 06/08/17 at the time when he was discharged from rehab facility.  The rash increased in size more redness in skin with more swelling in foot on the following week. He was seen for this at PCP office without definitive treatment plan. At this time the redness and swelling is decreasing noticeable degree. The last visit to this office was 01/05/17 and had to miss appointment due to the stroke.  Objective: Dermatologic: Thick yellow deformed nails x 10. Ingrown hallucal nails bilateral. Pink and brown skin eruption at lesser digital base bilateral. Oozing skin dorsum right due to excess skin expansion from edema. Vascular: Pedal pulses are all palpable. Orthopedic: Contracted lesser digits  Neurologic: All epicritic and tactile sensations grossly intact.  Assessment: R/O Drug induced atopic dermatitis and cellular reaction with edema both feet, healing in progress.  Dystrophic mycotic nails x 10. Ingrown hallucal nails both great toe. Diabetic neuropathy. Poor vision. Recent Stroke with weakness on right side.  Treatment: All mycotic nails debrided.  Right great toe nail bleeding nail border cleansed with Iodine and compression dressing applied. Oozing and fissured skin cleansed with H2O2 and Amerigel ointment dressing with Compressing stockinet applied with home care instruction. Return in 3 months or sooner if needed.

## 2017-06-16 NOTE — Patient Instructions (Addendum)
Seen for hypertrophic nails. Noted of oozing skin from previous dermatologic event. Area dressed with Amerigel ointment and compression stockinet. Amerigel ointment dispensed. All nails debrided. Return in 3 months or as needed.

## 2017-06-20 ENCOUNTER — Institutional Professional Consult (permissible substitution): Payer: Medicare Other | Admitting: Cardiology

## 2017-08-11 ENCOUNTER — Encounter: Payer: Self-pay | Admitting: Cardiology

## 2017-08-25 DIAGNOSIS — Z0001 Encounter for general adult medical examination with abnormal findings: Secondary | ICD-10-CM | POA: Diagnosis not present

## 2017-08-25 DIAGNOSIS — Z23 Encounter for immunization: Secondary | ICD-10-CM | POA: Diagnosis not present

## 2017-08-25 DIAGNOSIS — E119 Type 2 diabetes mellitus without complications: Secondary | ICD-10-CM | POA: Diagnosis not present

## 2017-08-25 DIAGNOSIS — E1122 Type 2 diabetes mellitus with diabetic chronic kidney disease: Secondary | ICD-10-CM | POA: Diagnosis not present

## 2017-08-25 DIAGNOSIS — I10 Essential (primary) hypertension: Secondary | ICD-10-CM | POA: Diagnosis not present

## 2017-08-25 DIAGNOSIS — M6281 Muscle weakness (generalized): Secondary | ICD-10-CM | POA: Diagnosis not present

## 2017-08-25 DIAGNOSIS — I2510 Atherosclerotic heart disease of native coronary artery without angina pectoris: Secondary | ICD-10-CM | POA: Diagnosis not present

## 2017-08-25 DIAGNOSIS — E7889 Other lipoprotein metabolism disorders: Secondary | ICD-10-CM | POA: Diagnosis not present

## 2017-08-26 DIAGNOSIS — H40052 Ocular hypertension, left eye: Secondary | ICD-10-CM | POA: Diagnosis not present

## 2017-08-26 DIAGNOSIS — H353232 Exudative age-related macular degeneration, bilateral, with inactive choroidal neovascularization: Secondary | ICD-10-CM | POA: Diagnosis not present

## 2017-08-26 DIAGNOSIS — Z961 Presence of intraocular lens: Secondary | ICD-10-CM | POA: Diagnosis not present

## 2017-08-26 DIAGNOSIS — H35033 Hypertensive retinopathy, bilateral: Secondary | ICD-10-CM | POA: Diagnosis not present

## 2017-08-31 DIAGNOSIS — E1122 Type 2 diabetes mellitus with diabetic chronic kidney disease: Secondary | ICD-10-CM | POA: Diagnosis not present

## 2017-08-31 DIAGNOSIS — I2510 Atherosclerotic heart disease of native coronary artery without angina pectoris: Secondary | ICD-10-CM | POA: Diagnosis not present

## 2017-08-31 DIAGNOSIS — E782 Mixed hyperlipidemia: Secondary | ICD-10-CM | POA: Diagnosis not present

## 2017-08-31 DIAGNOSIS — H548 Legal blindness, as defined in USA: Secondary | ICD-10-CM | POA: Diagnosis not present

## 2017-09-01 DIAGNOSIS — I10 Essential (primary) hypertension: Secondary | ICD-10-CM | POA: Diagnosis not present

## 2017-09-01 DIAGNOSIS — E1122 Type 2 diabetes mellitus with diabetic chronic kidney disease: Secondary | ICD-10-CM | POA: Diagnosis not present

## 2017-09-01 DIAGNOSIS — N400 Enlarged prostate without lower urinary tract symptoms: Secondary | ICD-10-CM | POA: Diagnosis not present

## 2017-09-01 DIAGNOSIS — I2510 Atherosclerotic heart disease of native coronary artery without angina pectoris: Secondary | ICD-10-CM | POA: Diagnosis not present

## 2017-09-08 DIAGNOSIS — I10 Essential (primary) hypertension: Secondary | ICD-10-CM | POA: Diagnosis not present

## 2017-09-08 DIAGNOSIS — E1122 Type 2 diabetes mellitus with diabetic chronic kidney disease: Secondary | ICD-10-CM | POA: Diagnosis not present

## 2017-09-14 ENCOUNTER — Ambulatory Visit: Payer: Medicare Other | Admitting: Podiatry

## 2017-09-14 ENCOUNTER — Encounter: Payer: Self-pay | Admitting: Podiatry

## 2017-09-14 DIAGNOSIS — L600 Ingrowing nail: Secondary | ICD-10-CM

## 2017-09-14 DIAGNOSIS — M79671 Pain in right foot: Secondary | ICD-10-CM

## 2017-09-14 DIAGNOSIS — B351 Tinea unguium: Secondary | ICD-10-CM | POA: Diagnosis not present

## 2017-09-14 DIAGNOSIS — M79672 Pain in left foot: Secondary | ICD-10-CM | POA: Diagnosis not present

## 2017-09-14 NOTE — Progress Notes (Signed)
Subjective: 84 y.o. year old male patient presents using a cane assisted by his wife with painful nails. Patient requests toe nails trimmed.  Patient has lost eye sight and need assistance while walking.  This morning blood sugar was 103.  History of stroke on 04/24/2017. Recovering well.  Objective: Dermatologic: Thick yellow deformed nails x 10. Ingrown hallucal nail bilateral. No open skin. Vascular: Pedal pulses are faintly palpable. Orthopedic: Contracted lesser digits  Neurologic: All epicritic and tactile sensations grossly intact.  Assessment: Dystrophic mycotic nails x 10. Diabetic neuropathy. Poor vision. Post stroke weak right side.  Treatment: All mycotic nails debrided.  Bleeding left great toe cleansed with Iodine and compressing dressing placed. Return in 3 months or sooner if needed.

## 2017-09-14 NOTE — Patient Instructions (Signed)
Seen for hypertrophic nails. All nails debrided. Return in 3 months or as needed.  

## 2017-09-21 DIAGNOSIS — E1122 Type 2 diabetes mellitus with diabetic chronic kidney disease: Secondary | ICD-10-CM | POA: Diagnosis not present

## 2017-09-21 DIAGNOSIS — H548 Legal blindness, as defined in USA: Secondary | ICD-10-CM | POA: Diagnosis not present

## 2017-09-21 DIAGNOSIS — E782 Mixed hyperlipidemia: Secondary | ICD-10-CM | POA: Diagnosis not present

## 2017-09-21 DIAGNOSIS — Z8673 Personal history of transient ischemic attack (TIA), and cerebral infarction without residual deficits: Secondary | ICD-10-CM | POA: Diagnosis not present

## 2017-09-21 DIAGNOSIS — I2510 Atherosclerotic heart disease of native coronary artery without angina pectoris: Secondary | ICD-10-CM | POA: Diagnosis not present

## 2017-09-22 DIAGNOSIS — E119 Type 2 diabetes mellitus without complications: Secondary | ICD-10-CM | POA: Diagnosis not present

## 2017-09-22 DIAGNOSIS — M6281 Muscle weakness (generalized): Secondary | ICD-10-CM | POA: Diagnosis not present

## 2017-10-23 DIAGNOSIS — E119 Type 2 diabetes mellitus without complications: Secondary | ICD-10-CM | POA: Diagnosis not present

## 2017-10-23 DIAGNOSIS — M6281 Muscle weakness (generalized): Secondary | ICD-10-CM | POA: Diagnosis not present

## 2017-10-26 DIAGNOSIS — E119 Type 2 diabetes mellitus without complications: Secondary | ICD-10-CM | POA: Diagnosis not present

## 2017-10-26 DIAGNOSIS — E1140 Type 2 diabetes mellitus with diabetic neuropathy, unspecified: Secondary | ICD-10-CM | POA: Diagnosis not present

## 2017-10-26 DIAGNOSIS — E11610 Type 2 diabetes mellitus with diabetic neuropathic arthropathy: Secondary | ICD-10-CM | POA: Diagnosis not present

## 2017-11-02 DIAGNOSIS — E782 Mixed hyperlipidemia: Secondary | ICD-10-CM | POA: Diagnosis not present

## 2017-11-02 DIAGNOSIS — I2510 Atherosclerotic heart disease of native coronary artery without angina pectoris: Secondary | ICD-10-CM | POA: Diagnosis not present

## 2017-11-02 DIAGNOSIS — E1122 Type 2 diabetes mellitus with diabetic chronic kidney disease: Secondary | ICD-10-CM | POA: Diagnosis not present

## 2017-11-02 DIAGNOSIS — E1140 Type 2 diabetes mellitus with diabetic neuropathy, unspecified: Secondary | ICD-10-CM | POA: Diagnosis not present

## 2017-11-22 DIAGNOSIS — M6281 Muscle weakness (generalized): Secondary | ICD-10-CM | POA: Diagnosis not present

## 2017-11-22 DIAGNOSIS — E119 Type 2 diabetes mellitus without complications: Secondary | ICD-10-CM | POA: Diagnosis not present

## 2017-12-08 IMAGING — MR MR MRA HEAD W/O CM
3 series · 19 of 48 positions shown · non-contrast
Comparison: MRI of the head April 26, 2017 and MRA head October 07, 2011

CLINICAL DATA: Follow-up stroke. History of hypertension,
hyperlipidemia, diabetes and stroke.

EXAM:
MRA HEAD WITHOUT CONTRAST
TECHNIQUE: Angiographic images of the Circle of Willis were obtained using MRA
technique without intravenous contrast.

[Series 3: (id) mt fs · axial · 1.4mm · 0.43mm/px · z∈[-52,+38]mm · 17 of 136 slices shown]
[im 1/136]
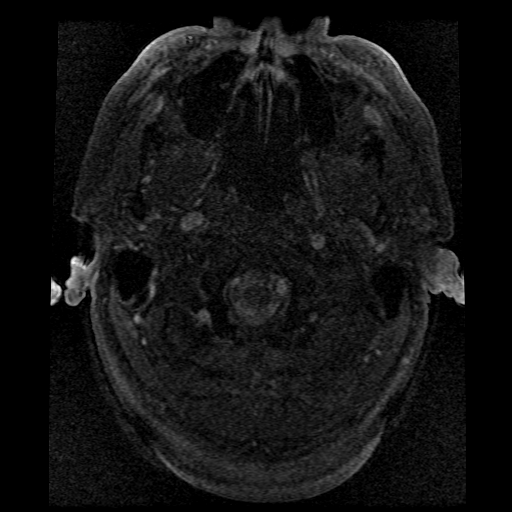
[im 4/136]
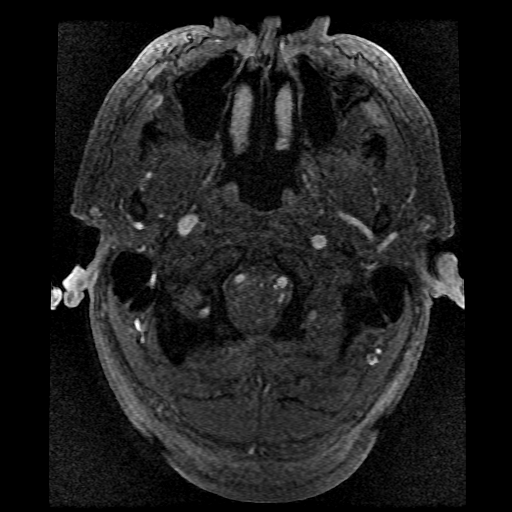
[im 7/136]
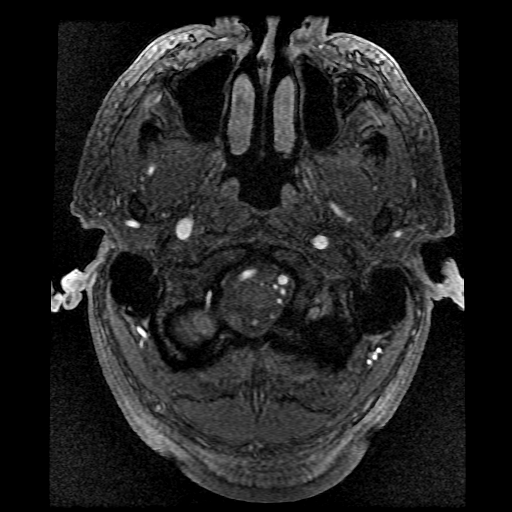
[im 10/136]
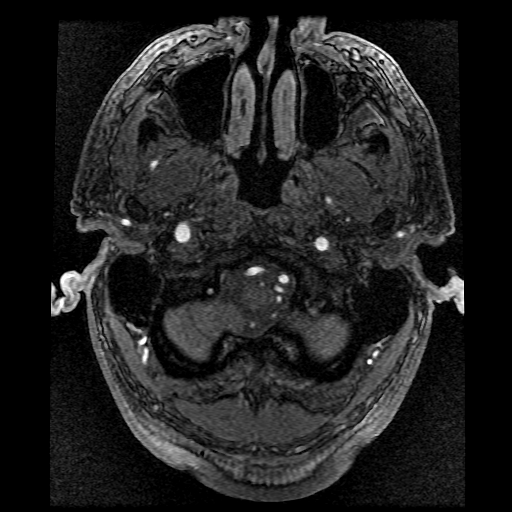
[im 13/136]
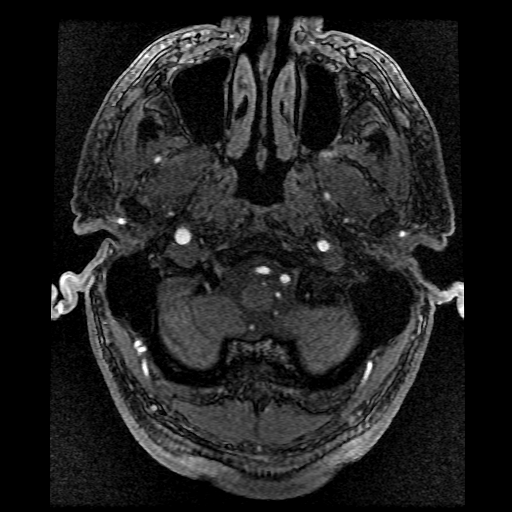
[im 16/136]
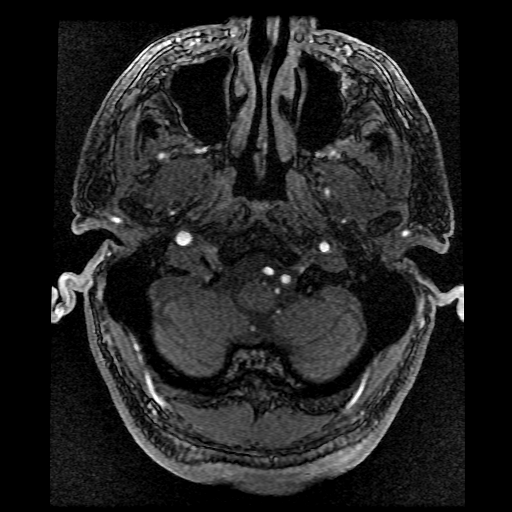
[im 19/136]
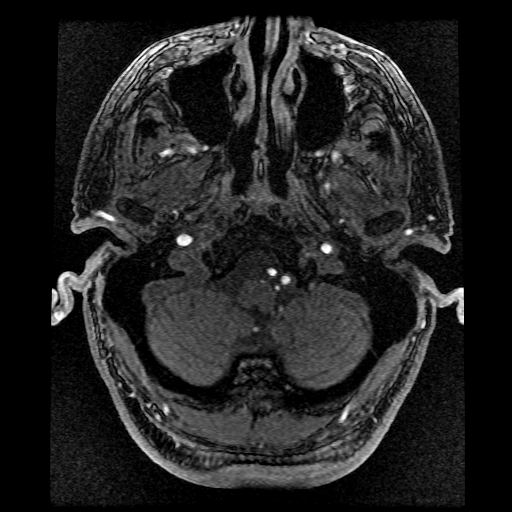
[im 22/136]
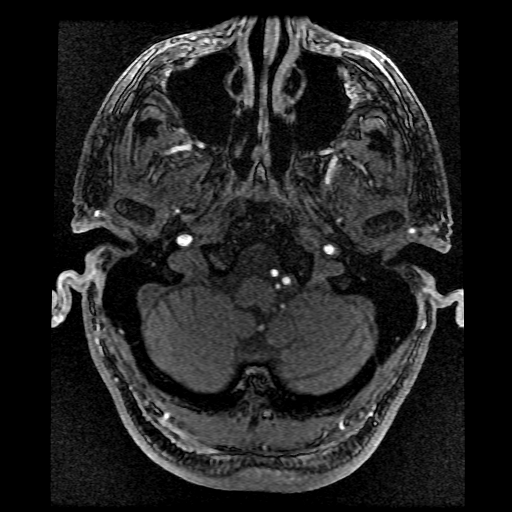
[im 25/136]
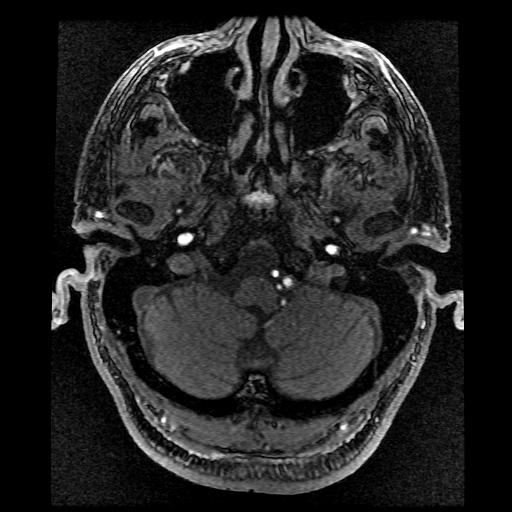
[im 43/136]
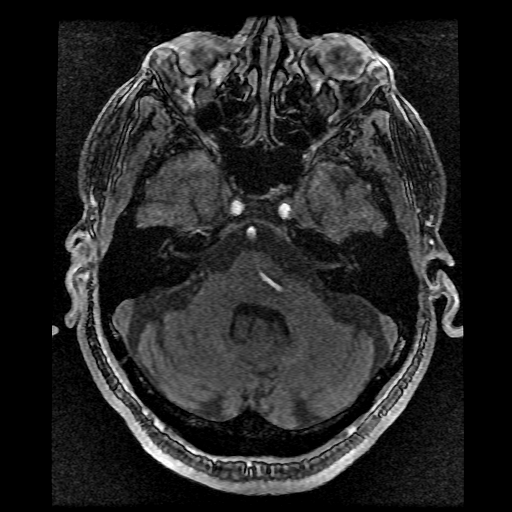
[im 61/136]
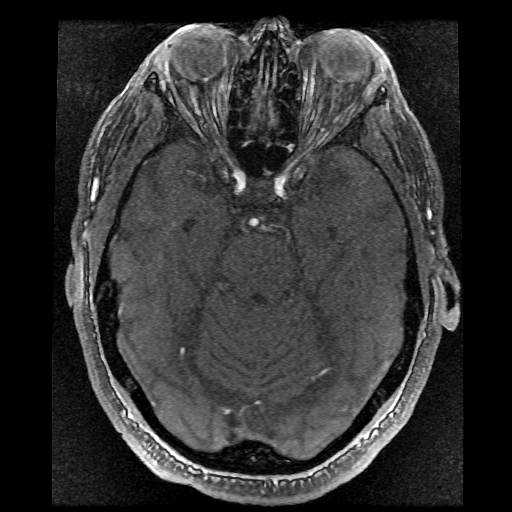
[im 70/136]
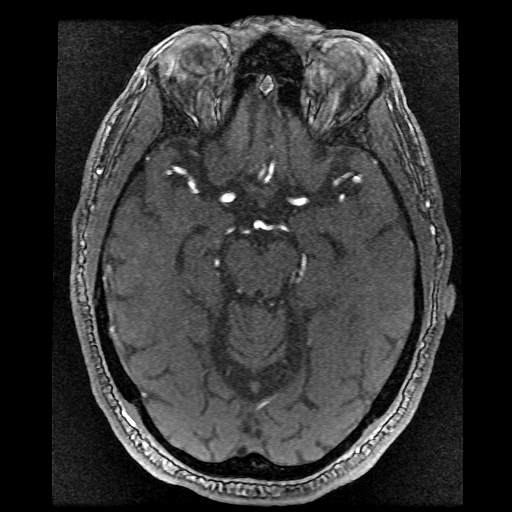
[im 76/136]
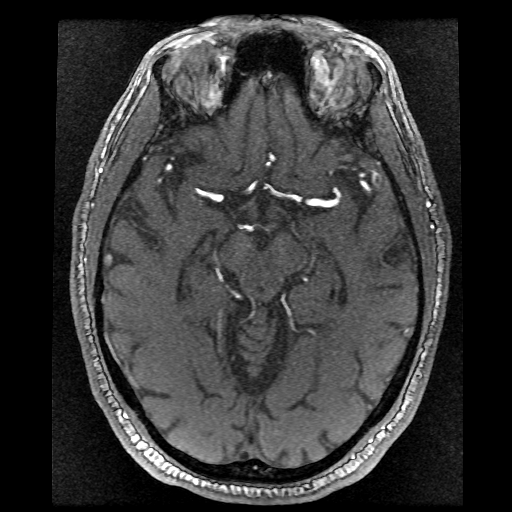
[im 94/136]
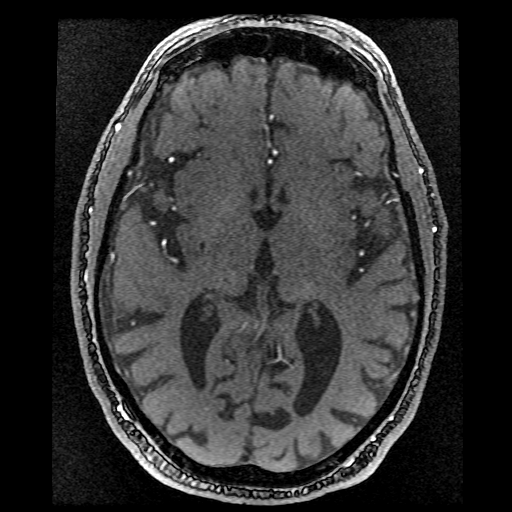
[im 112/136]
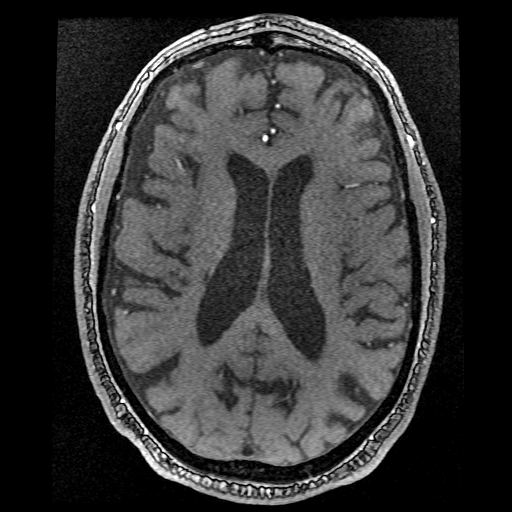
[im 115/136]
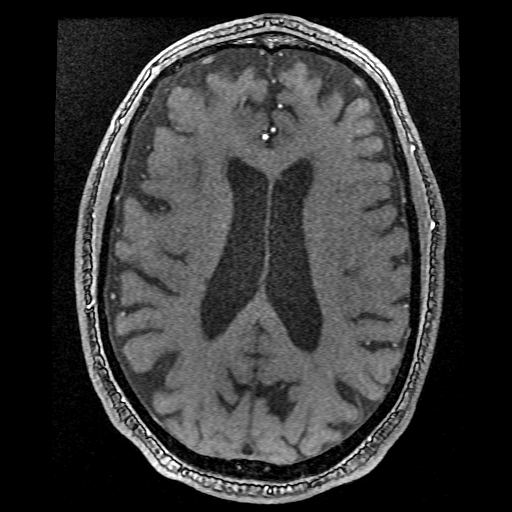
[im 130/136]
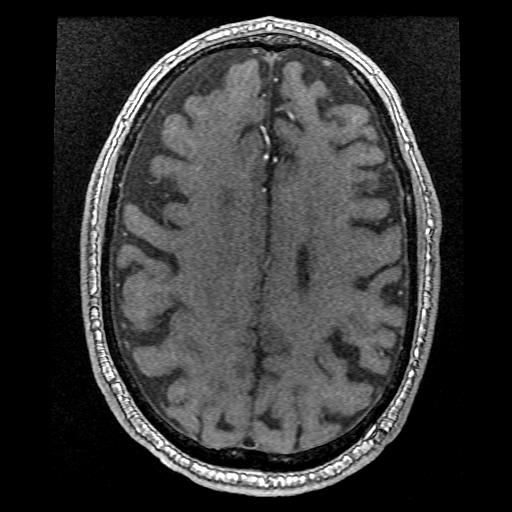

[Series 301: processed images · axial · 1.4mm · 0.43mm/px · 1 of 1 slices shown (1 of 2)]
[im 1/1]
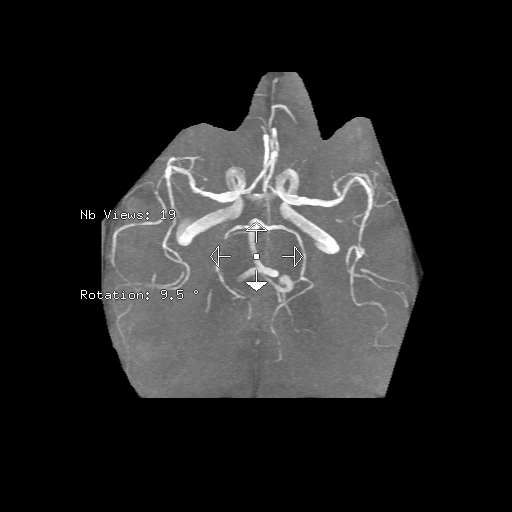

[Series 303: processed images · axial · 1.4mm · 0.43mm/px · 1 of 1 slices shown (2 of 2)]
[im 1/1]
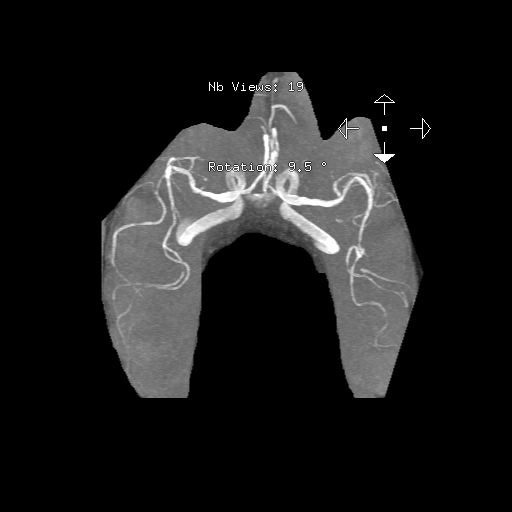

[19 of 48 positions shown; findings below may reference images not displayed]

FINDINGS: ANTERIOR CIRCULATION: Normal flow related enhancement of the
included cervical, petrous, cavernous and supraclinoid internal
carotid arteries. Patent anterior communicating artery. 2 mm
inferomedially directed aneurysm LEFT A1-2 junction. Patent anterior
and middle cerebral arteries, including distal segments. Moderate
tandem stenoses anterior middle cerebral artery's.

No large vessel occlusion, high-grade stenosis.

POSTERIOR CIRCULATION: Codominant vertebral artery's. No RIGHT
posterior-inferior cerebellar artery flow related enhancement.
Basilar artery is patent, with normal flow related enhancement of
the remaining main branch vessels. Patent posterior cerebral
arteries. Moderate bilateral posterior cerebral artery tandem
stenoses.

No high-grade stenosis,  aneurysm.

ANATOMIC VARIANTS: None.

Source images and MIP images were reviewed.
IMPRESSION: 1. Nonvisualized RIGHT posterior- inferior cerebellar artery seen
with artifact, slow flow or occlusion. CT angiogram of the head and
neck may be more definitive.
2. Moderate stenoses of the anterior and posterior circulation
compatible with atherosclerosis.
3. 2 mm LEFT A1-2 junction aneurysm.

## 2017-12-10 IMAGING — CR DG CHEST 2V
2 series · 2 of 2 positions shown · non-contrast
Comparison: 01/05/2015

CLINICAL DATA: Fever

EXAM:
CHEST  2 VIEW

[chest lat]
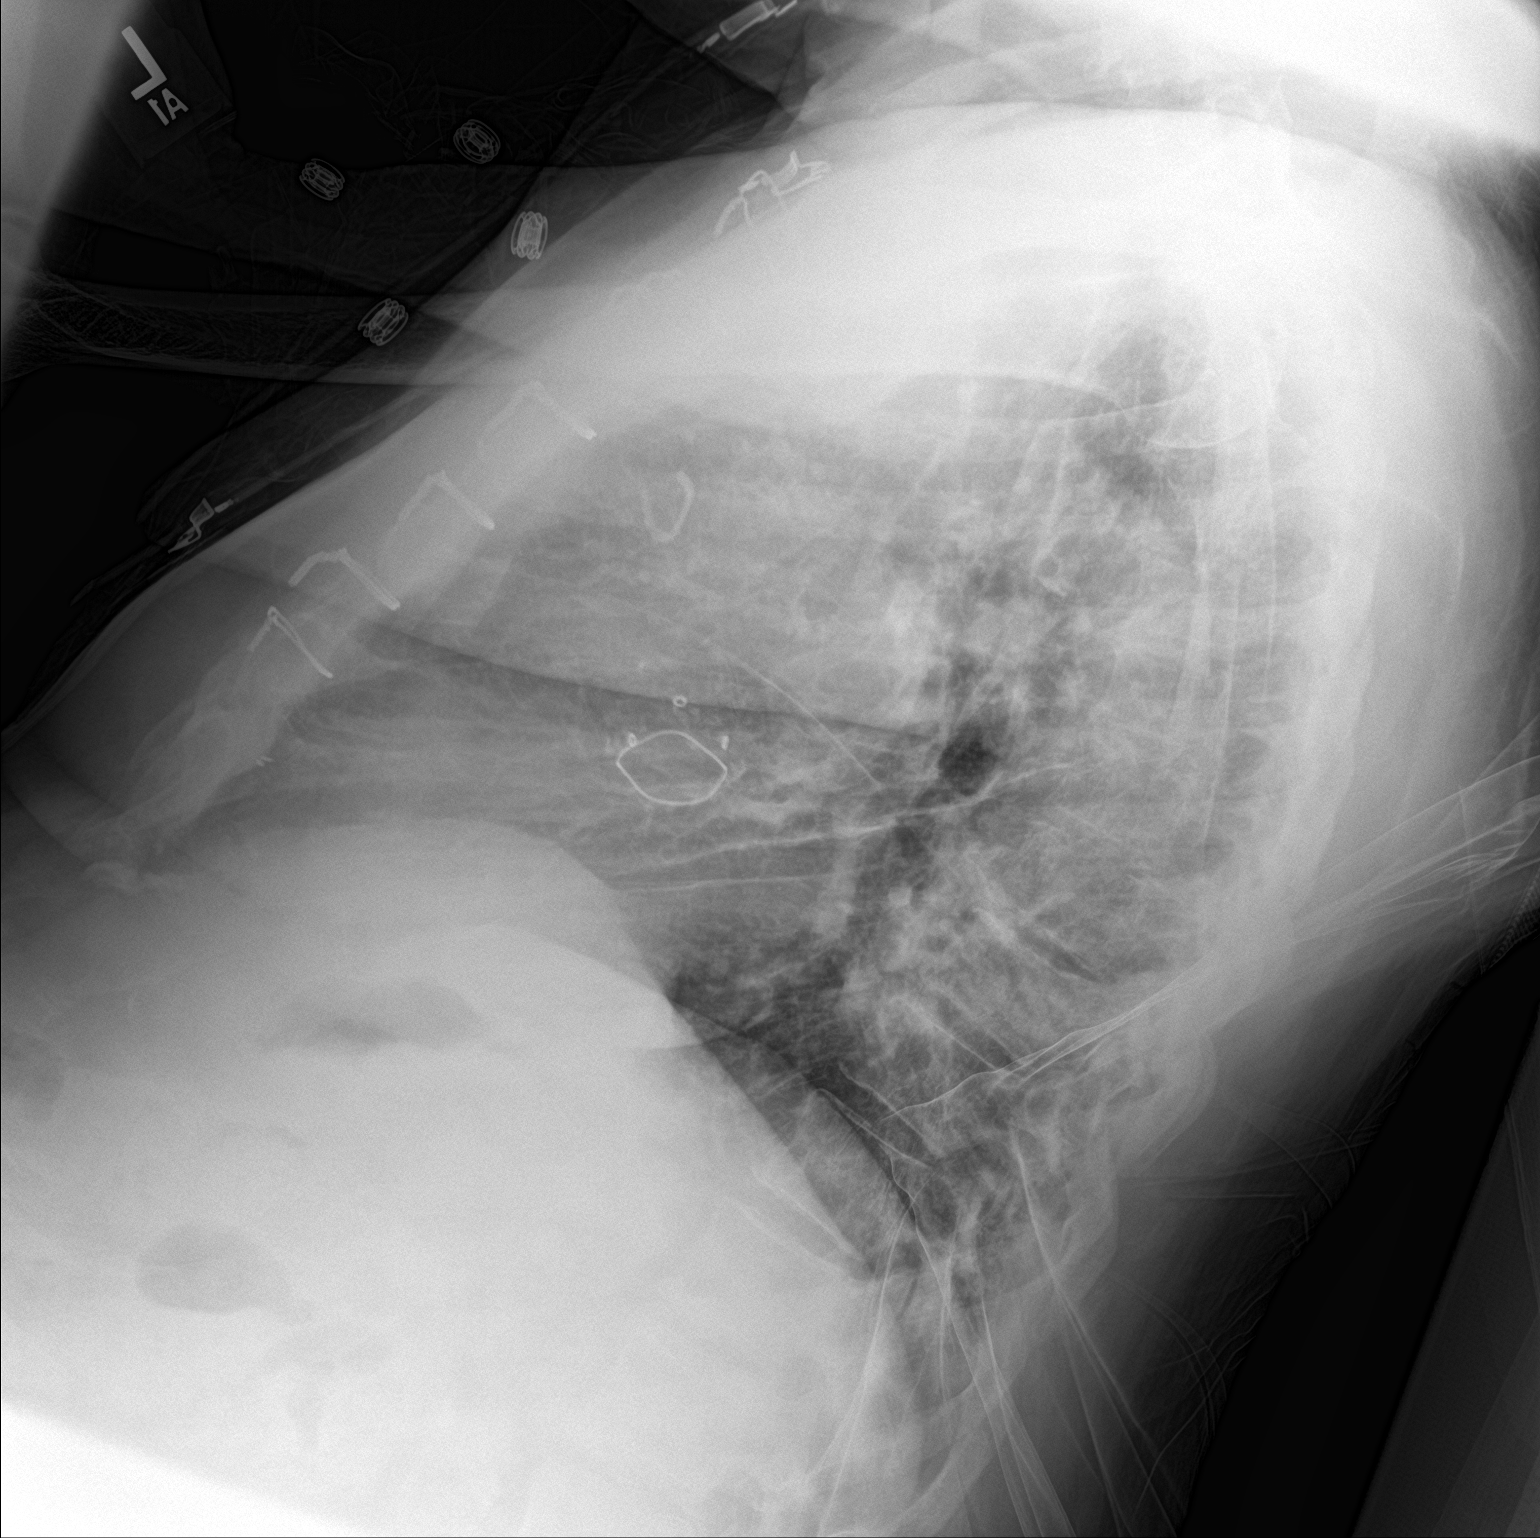

[chest ap]
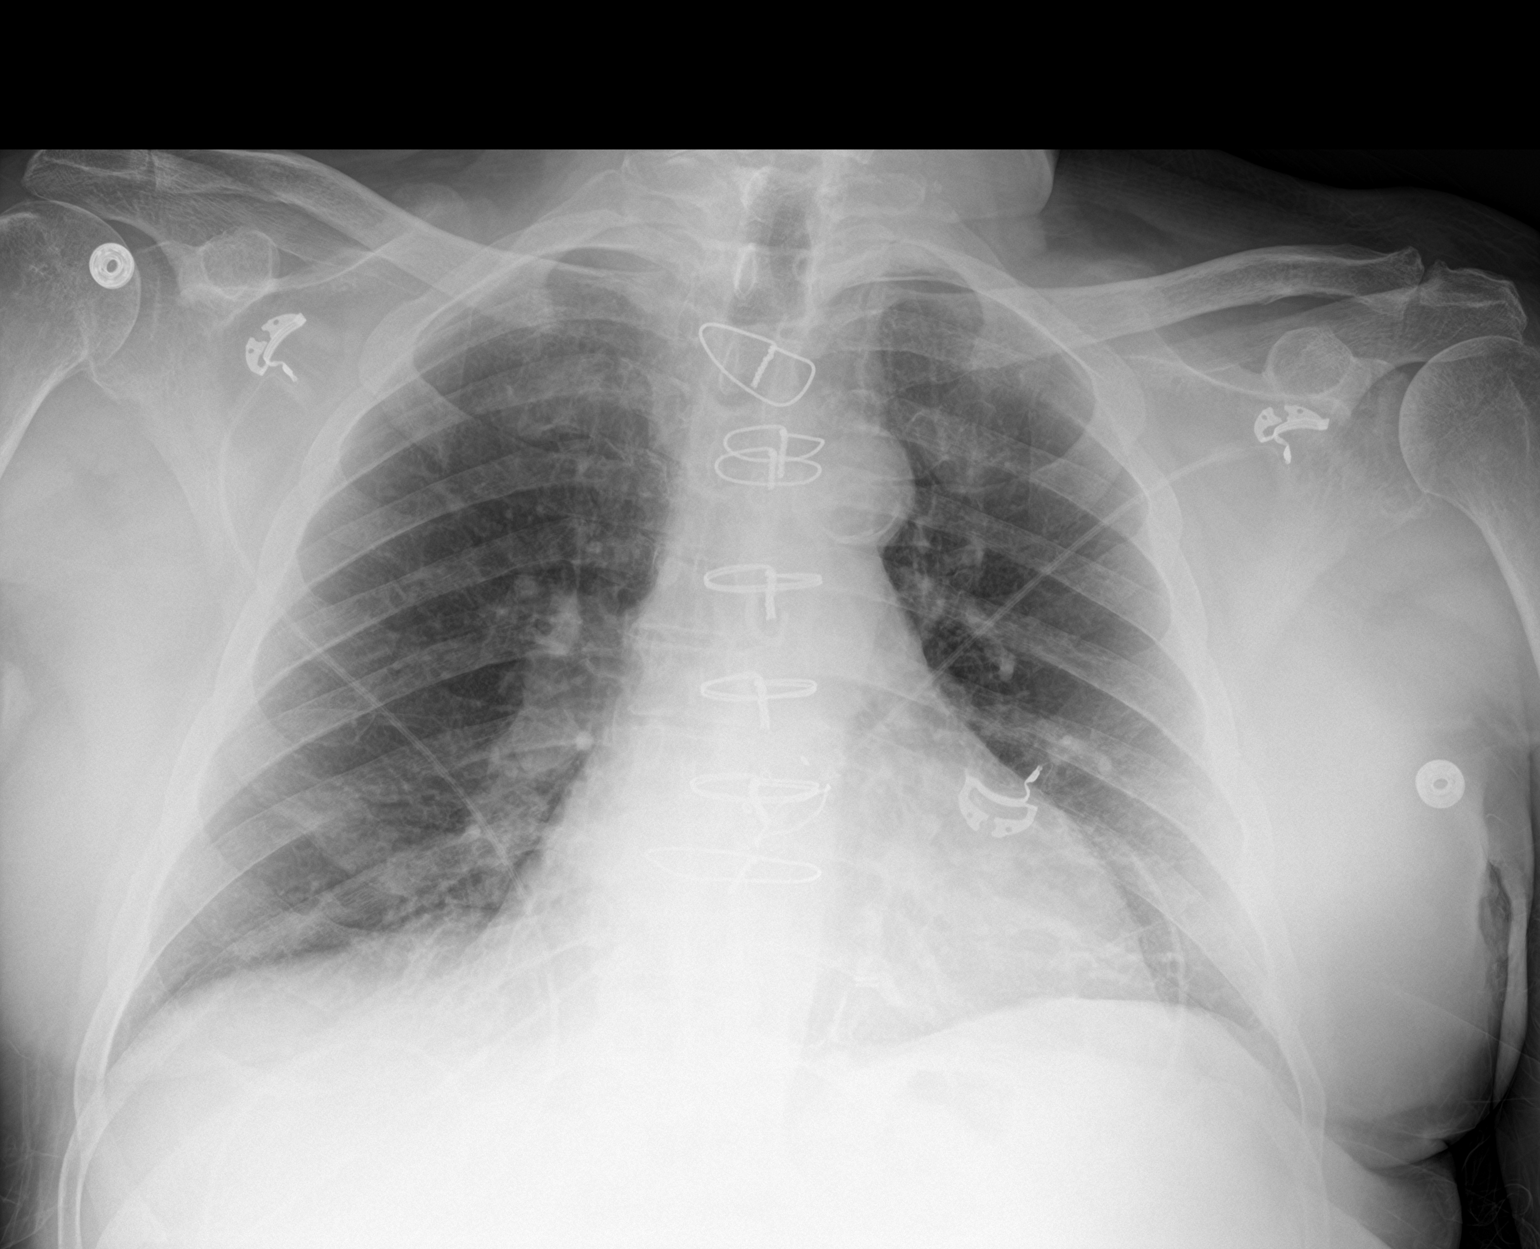

[2 of 2 positions shown; findings below may reference images not displayed]

FINDINGS: There is mild bilateral interstitial prominence. There is no focal
consolidation. There is no pleural effusion or pneumothorax. The
heart and mediastinal contours are unremarkable. There is evidence
of prior median sternotomy and aortic valve replacement.

The osseous structures are unremarkable.
IMPRESSION: No active cardiopulmonary disease.

## 2017-12-14 DIAGNOSIS — E782 Mixed hyperlipidemia: Secondary | ICD-10-CM | POA: Diagnosis not present

## 2017-12-14 DIAGNOSIS — R946 Abnormal results of thyroid function studies: Secondary | ICD-10-CM | POA: Diagnosis not present

## 2017-12-14 DIAGNOSIS — E1140 Type 2 diabetes mellitus with diabetic neuropathy, unspecified: Secondary | ICD-10-CM | POA: Diagnosis not present

## 2017-12-14 DIAGNOSIS — E1122 Type 2 diabetes mellitus with diabetic chronic kidney disease: Secondary | ICD-10-CM | POA: Diagnosis not present

## 2017-12-14 DIAGNOSIS — I2510 Atherosclerotic heart disease of native coronary artery without angina pectoris: Secondary | ICD-10-CM | POA: Diagnosis not present

## 2017-12-20 ENCOUNTER — Ambulatory Visit: Payer: Medicare Other | Admitting: Podiatry

## 2017-12-23 DIAGNOSIS — E119 Type 2 diabetes mellitus without complications: Secondary | ICD-10-CM | POA: Diagnosis not present

## 2017-12-23 DIAGNOSIS — M6281 Muscle weakness (generalized): Secondary | ICD-10-CM | POA: Diagnosis not present

## 2018-01-03 DIAGNOSIS — H40052 Ocular hypertension, left eye: Secondary | ICD-10-CM | POA: Diagnosis not present

## 2018-01-03 DIAGNOSIS — H35033 Hypertensive retinopathy, bilateral: Secondary | ICD-10-CM | POA: Diagnosis not present

## 2018-01-03 DIAGNOSIS — H353232 Exudative age-related macular degeneration, bilateral, with inactive choroidal neovascularization: Secondary | ICD-10-CM | POA: Diagnosis not present

## 2018-01-03 DIAGNOSIS — T8521XD Breakdown (mechanical) of intraocular lens, subsequent encounter: Secondary | ICD-10-CM | POA: Diagnosis not present

## 2018-01-03 DIAGNOSIS — Z961 Presence of intraocular lens: Secondary | ICD-10-CM | POA: Diagnosis not present

## 2018-01-22 DIAGNOSIS — M6281 Muscle weakness (generalized): Secondary | ICD-10-CM | POA: Diagnosis not present

## 2018-01-22 DIAGNOSIS — E119 Type 2 diabetes mellitus without complications: Secondary | ICD-10-CM | POA: Diagnosis not present

## 2018-02-13 DIAGNOSIS — I2510 Atherosclerotic heart disease of native coronary artery without angina pectoris: Secondary | ICD-10-CM | POA: Diagnosis not present

## 2018-02-13 DIAGNOSIS — R946 Abnormal results of thyroid function studies: Secondary | ICD-10-CM | POA: Diagnosis not present

## 2018-02-13 DIAGNOSIS — E782 Mixed hyperlipidemia: Secondary | ICD-10-CM | POA: Diagnosis not present

## 2018-02-13 DIAGNOSIS — E119 Type 2 diabetes mellitus without complications: Secondary | ICD-10-CM | POA: Diagnosis not present

## 2018-02-22 DIAGNOSIS — E119 Type 2 diabetes mellitus without complications: Secondary | ICD-10-CM | POA: Diagnosis not present

## 2018-02-22 DIAGNOSIS — M6281 Muscle weakness (generalized): Secondary | ICD-10-CM | POA: Diagnosis not present

## 2018-03-09 DIAGNOSIS — E11319 Type 2 diabetes mellitus with unspecified diabetic retinopathy without macular edema: Secondary | ICD-10-CM | POA: Diagnosis not present

## 2018-03-09 DIAGNOSIS — Z794 Long term (current) use of insulin: Secondary | ICD-10-CM | POA: Diagnosis not present

## 2018-04-12 DIAGNOSIS — E11319 Type 2 diabetes mellitus with unspecified diabetic retinopathy without macular edema: Secondary | ICD-10-CM | POA: Diagnosis not present

## 2018-04-12 DIAGNOSIS — Z794 Long term (current) use of insulin: Secondary | ICD-10-CM | POA: Diagnosis not present

## 2018-04-13 ENCOUNTER — Encounter: Payer: Self-pay | Admitting: Podiatry

## 2018-04-13 ENCOUNTER — Ambulatory Visit: Payer: Medicare Other | Admitting: Podiatry

## 2018-04-13 DIAGNOSIS — B351 Tinea unguium: Secondary | ICD-10-CM

## 2018-04-13 DIAGNOSIS — L600 Ingrowing nail: Secondary | ICD-10-CM | POA: Diagnosis not present

## 2018-04-13 DIAGNOSIS — M79671 Pain in right foot: Secondary | ICD-10-CM | POA: Diagnosis not present

## 2018-04-13 DIAGNOSIS — M79672 Pain in left foot: Secondary | ICD-10-CM | POA: Diagnosis not present

## 2018-04-13 NOTE — Progress Notes (Signed)
Subjective: 84 y.o. year old male patient presents using a cane assisted by his wife with painful nails. Patient requests toe nails trimmed.  Patient has lost eye sight and need assistance while walking.  Blood sugar is under control. A1c was 6.7.  History of strokeon10/14/2018.Recovering well.  Objective: Dermatologic: Thick yellow deformed nails x 10. Ingrown hallucal nail bilateral. No open skin. Vascular: Pedal pulses are faintly palpable. Orthopedic: Contracted lesser digits  Neurologic: All epicritic and tactile sensations grossly intact.  Assessment: Dystrophic mycotic nails x 10. Diabetic neuropathy. Poor vision. Post stroke weak right side.  Treatment: All mycotic nails debrided.  Bleeding left great toe cleansed with Iodine and compressing dressing placed. Return in 3 months or sooner if needed.

## 2018-04-13 NOTE — Patient Instructions (Signed)
Seen for hypertrophic nails. All nails debrided. Return as needed.  

## 2018-05-09 DIAGNOSIS — E1165 Type 2 diabetes mellitus with hyperglycemia: Secondary | ICD-10-CM | POA: Diagnosis not present

## 2018-05-09 DIAGNOSIS — R946 Abnormal results of thyroid function studies: Secondary | ICD-10-CM | POA: Diagnosis not present

## 2018-05-15 DIAGNOSIS — Z794 Long term (current) use of insulin: Secondary | ICD-10-CM | POA: Diagnosis not present

## 2018-05-15 DIAGNOSIS — E11319 Type 2 diabetes mellitus with unspecified diabetic retinopathy without macular edema: Secondary | ICD-10-CM | POA: Diagnosis not present

## 2018-05-16 DIAGNOSIS — R946 Abnormal results of thyroid function studies: Secondary | ICD-10-CM | POA: Diagnosis not present

## 2018-05-16 DIAGNOSIS — E1140 Type 2 diabetes mellitus with diabetic neuropathy, unspecified: Secondary | ICD-10-CM | POA: Diagnosis not present

## 2018-05-16 DIAGNOSIS — E1122 Type 2 diabetes mellitus with diabetic chronic kidney disease: Secondary | ICD-10-CM | POA: Diagnosis not present

## 2018-05-16 DIAGNOSIS — E782 Mixed hyperlipidemia: Secondary | ICD-10-CM | POA: Diagnosis not present

## 2018-06-09 DIAGNOSIS — Z794 Long term (current) use of insulin: Secondary | ICD-10-CM | POA: Diagnosis not present

## 2018-06-09 DIAGNOSIS — E11319 Type 2 diabetes mellitus with unspecified diabetic retinopathy without macular edema: Secondary | ICD-10-CM | POA: Diagnosis not present

## 2018-08-07 DIAGNOSIS — I10 Essential (primary) hypertension: Secondary | ICD-10-CM | POA: Diagnosis not present

## 2018-08-07 DIAGNOSIS — E7889 Other lipoprotein metabolism disorders: Secondary | ICD-10-CM | POA: Diagnosis not present

## 2018-08-07 DIAGNOSIS — E119 Type 2 diabetes mellitus without complications: Secondary | ICD-10-CM | POA: Diagnosis not present

## 2018-08-11 DIAGNOSIS — Z87891 Personal history of nicotine dependence: Secondary | ICD-10-CM | POA: Diagnosis not present

## 2018-08-11 DIAGNOSIS — H6123 Impacted cerumen, bilateral: Secondary | ICD-10-CM | POA: Diagnosis not present

## 2018-08-11 DIAGNOSIS — H9311 Tinnitus, right ear: Secondary | ICD-10-CM | POA: Diagnosis not present

## 2018-08-11 DIAGNOSIS — H9190 Unspecified hearing loss, unspecified ear: Secondary | ICD-10-CM | POA: Insufficient documentation

## 2018-08-11 DIAGNOSIS — I10 Essential (primary) hypertension: Secondary | ICD-10-CM | POA: Diagnosis not present

## 2018-08-16 DIAGNOSIS — E1122 Type 2 diabetes mellitus with diabetic chronic kidney disease: Secondary | ICD-10-CM | POA: Diagnosis not present

## 2018-08-16 DIAGNOSIS — E1140 Type 2 diabetes mellitus with diabetic neuropathy, unspecified: Secondary | ICD-10-CM | POA: Diagnosis not present

## 2018-08-16 DIAGNOSIS — E119 Type 2 diabetes mellitus without complications: Secondary | ICD-10-CM | POA: Diagnosis not present

## 2018-08-16 DIAGNOSIS — R946 Abnormal results of thyroid function studies: Secondary | ICD-10-CM | POA: Diagnosis not present

## 2018-08-16 DIAGNOSIS — E782 Mixed hyperlipidemia: Secondary | ICD-10-CM | POA: Diagnosis not present

## 2018-08-25 DIAGNOSIS — I10 Essential (primary) hypertension: Secondary | ICD-10-CM | POA: Diagnosis not present

## 2018-08-25 DIAGNOSIS — E119 Type 2 diabetes mellitus without complications: Secondary | ICD-10-CM | POA: Diagnosis not present

## 2018-08-29 DIAGNOSIS — Z952 Presence of prosthetic heart valve: Secondary | ICD-10-CM | POA: Diagnosis not present

## 2018-08-29 DIAGNOSIS — Z0000 Encounter for general adult medical examination without abnormal findings: Secondary | ICD-10-CM | POA: Diagnosis not present

## 2018-08-29 DIAGNOSIS — E11319 Type 2 diabetes mellitus with unspecified diabetic retinopathy without macular edema: Secondary | ICD-10-CM | POA: Diagnosis not present

## 2018-08-29 DIAGNOSIS — E1122 Type 2 diabetes mellitus with diabetic chronic kidney disease: Secondary | ICD-10-CM | POA: Diagnosis not present

## 2018-09-07 DIAGNOSIS — H547 Unspecified visual loss: Secondary | ICD-10-CM | POA: Diagnosis not present

## 2018-09-07 DIAGNOSIS — Z951 Presence of aortocoronary bypass graft: Secondary | ICD-10-CM | POA: Diagnosis not present

## 2018-09-07 DIAGNOSIS — R9431 Abnormal electrocardiogram [ECG] [EKG]: Secondary | ICD-10-CM | POA: Diagnosis not present

## 2018-09-07 DIAGNOSIS — Z952 Presence of prosthetic heart valve: Secondary | ICD-10-CM | POA: Diagnosis not present

## 2018-09-07 DIAGNOSIS — I493 Ventricular premature depolarization: Secondary | ICD-10-CM | POA: Diagnosis not present

## 2018-09-07 DIAGNOSIS — E11319 Type 2 diabetes mellitus with unspecified diabetic retinopathy without macular edema: Secondary | ICD-10-CM | POA: Diagnosis not present

## 2018-09-07 DIAGNOSIS — Z8679 Personal history of other diseases of the circulatory system: Secondary | ICD-10-CM | POA: Diagnosis not present

## 2018-09-07 DIAGNOSIS — I454 Nonspecific intraventricular block: Secondary | ICD-10-CM | POA: Diagnosis not present

## 2018-09-07 DIAGNOSIS — H3530 Unspecified macular degeneration: Secondary | ICD-10-CM | POA: Diagnosis not present

## 2018-09-07 DIAGNOSIS — I2510 Atherosclerotic heart disease of native coronary artery without angina pectoris: Secondary | ICD-10-CM | POA: Diagnosis not present

## 2018-09-08 DIAGNOSIS — Z794 Long term (current) use of insulin: Secondary | ICD-10-CM | POA: Diagnosis not present

## 2018-09-08 DIAGNOSIS — E11319 Type 2 diabetes mellitus with unspecified diabetic retinopathy without macular edema: Secondary | ICD-10-CM | POA: Diagnosis not present

## 2018-09-27 DIAGNOSIS — I82402 Acute embolism and thrombosis of unspecified deep veins of left lower extremity: Secondary | ICD-10-CM | POA: Diagnosis not present

## 2018-09-28 DIAGNOSIS — M79605 Pain in left leg: Secondary | ICD-10-CM | POA: Diagnosis not present

## 2018-09-28 DIAGNOSIS — L729 Follicular cyst of the skin and subcutaneous tissue, unspecified: Secondary | ICD-10-CM | POA: Diagnosis not present

## 2018-09-28 DIAGNOSIS — M7989 Other specified soft tissue disorders: Secondary | ICD-10-CM | POA: Diagnosis not present

## 2018-09-28 DIAGNOSIS — I82402 Acute embolism and thrombosis of unspecified deep veins of left lower extremity: Secondary | ICD-10-CM | POA: Diagnosis not present

## 2018-09-29 DIAGNOSIS — L03116 Cellulitis of left lower limb: Secondary | ICD-10-CM | POA: Diagnosis not present

## 2018-09-29 DIAGNOSIS — M7989 Other specified soft tissue disorders: Secondary | ICD-10-CM | POA: Diagnosis not present

## 2018-09-29 DIAGNOSIS — R590 Localized enlarged lymph nodes: Secondary | ICD-10-CM | POA: Diagnosis not present

## 2018-10-23 DIAGNOSIS — L03116 Cellulitis of left lower limb: Secondary | ICD-10-CM | POA: Diagnosis not present

## 2018-11-08 DIAGNOSIS — E119 Type 2 diabetes mellitus without complications: Secondary | ICD-10-CM | POA: Diagnosis not present

## 2018-11-08 DIAGNOSIS — R946 Abnormal results of thyroid function studies: Secondary | ICD-10-CM | POA: Diagnosis not present

## 2018-11-15 DIAGNOSIS — E1140 Type 2 diabetes mellitus with diabetic neuropathy, unspecified: Secondary | ICD-10-CM | POA: Diagnosis not present

## 2018-11-15 DIAGNOSIS — R946 Abnormal results of thyroid function studies: Secondary | ICD-10-CM | POA: Diagnosis not present

## 2018-11-15 DIAGNOSIS — E782 Mixed hyperlipidemia: Secondary | ICD-10-CM | POA: Diagnosis not present

## 2018-11-15 DIAGNOSIS — E1122 Type 2 diabetes mellitus with diabetic chronic kidney disease: Secondary | ICD-10-CM | POA: Diagnosis not present

## 2018-12-07 DIAGNOSIS — E11319 Type 2 diabetes mellitus with unspecified diabetic retinopathy without macular edema: Secondary | ICD-10-CM | POA: Diagnosis not present

## 2018-12-07 DIAGNOSIS — Z794 Long term (current) use of insulin: Secondary | ICD-10-CM | POA: Diagnosis not present

## 2018-12-09 DIAGNOSIS — Z794 Long term (current) use of insulin: Secondary | ICD-10-CM | POA: Diagnosis not present

## 2018-12-09 DIAGNOSIS — E11319 Type 2 diabetes mellitus with unspecified diabetic retinopathy without macular edema: Secondary | ICD-10-CM | POA: Diagnosis not present

## 2018-12-19 DIAGNOSIS — M2042 Other hammer toe(s) (acquired), left foot: Secondary | ICD-10-CM | POA: Diagnosis not present

## 2018-12-19 DIAGNOSIS — B351 Tinea unguium: Secondary | ICD-10-CM | POA: Diagnosis not present

## 2018-12-19 DIAGNOSIS — I872 Venous insufficiency (chronic) (peripheral): Secondary | ICD-10-CM | POA: Diagnosis not present

## 2018-12-19 DIAGNOSIS — M2041 Other hammer toe(s) (acquired), right foot: Secondary | ICD-10-CM | POA: Diagnosis not present

## 2018-12-20 DIAGNOSIS — M2042 Other hammer toe(s) (acquired), left foot: Secondary | ICD-10-CM | POA: Insufficient documentation

## 2018-12-20 DIAGNOSIS — M2041 Other hammer toe(s) (acquired), right foot: Secondary | ICD-10-CM | POA: Insufficient documentation

## 2018-12-28 DIAGNOSIS — R600 Localized edema: Secondary | ICD-10-CM | POA: Diagnosis not present

## 2018-12-28 DIAGNOSIS — R269 Unspecified abnormalities of gait and mobility: Secondary | ICD-10-CM | POA: Diagnosis not present

## 2018-12-28 DIAGNOSIS — R21 Rash and other nonspecific skin eruption: Secondary | ICD-10-CM | POA: Diagnosis not present

## 2018-12-28 DIAGNOSIS — H3530 Unspecified macular degeneration: Secondary | ICD-10-CM | POA: Diagnosis not present

## 2019-02-15 DIAGNOSIS — E119 Type 2 diabetes mellitus without complications: Secondary | ICD-10-CM | POA: Diagnosis not present

## 2019-02-15 DIAGNOSIS — R946 Abnormal results of thyroid function studies: Secondary | ICD-10-CM | POA: Diagnosis not present

## 2019-02-15 DIAGNOSIS — E1165 Type 2 diabetes mellitus with hyperglycemia: Secondary | ICD-10-CM | POA: Diagnosis not present

## 2019-02-22 DIAGNOSIS — I2510 Atherosclerotic heart disease of native coronary artery without angina pectoris: Secondary | ICD-10-CM | POA: Diagnosis not present

## 2019-02-22 DIAGNOSIS — E782 Mixed hyperlipidemia: Secondary | ICD-10-CM | POA: Diagnosis not present

## 2019-02-22 DIAGNOSIS — E1122 Type 2 diabetes mellitus with diabetic chronic kidney disease: Secondary | ICD-10-CM | POA: Diagnosis not present

## 2019-02-22 DIAGNOSIS — R946 Abnormal results of thyroid function studies: Secondary | ICD-10-CM | POA: Diagnosis not present

## 2019-02-22 DIAGNOSIS — E119 Type 2 diabetes mellitus without complications: Secondary | ICD-10-CM | POA: Diagnosis not present

## 2019-03-12 DIAGNOSIS — E11319 Type 2 diabetes mellitus with unspecified diabetic retinopathy without macular edema: Secondary | ICD-10-CM | POA: Diagnosis not present

## 2019-03-12 DIAGNOSIS — Z794 Long term (current) use of insulin: Secondary | ICD-10-CM | POA: Diagnosis not present

## 2019-05-06 ENCOUNTER — Observation Stay (HOSPITAL_COMMUNITY)
Admission: EM | Admit: 2019-05-06 | Discharge: 2019-05-07 | Disposition: A | Discharge status: Home / Self Care | Payer: Medicare Other | Attending: Internal Medicine | Admitting: Internal Medicine

## 2019-05-06 ENCOUNTER — Other Ambulatory Visit: Payer: Self-pay

## 2019-05-06 ENCOUNTER — Encounter (HOSPITAL_COMMUNITY): Payer: Self-pay

## 2019-05-06 DIAGNOSIS — K219 Gastro-esophageal reflux disease without esophagitis: Secondary | ICD-10-CM | POA: Insufficient documentation

## 2019-05-06 DIAGNOSIS — N183 Chronic kidney disease, stage 3 unspecified: Secondary | ICD-10-CM | POA: Diagnosis not present

## 2019-05-06 DIAGNOSIS — Z7982 Long term (current) use of aspirin: Secondary | ICD-10-CM | POA: Diagnosis not present

## 2019-05-06 DIAGNOSIS — N400 Enlarged prostate without lower urinary tract symptoms: Secondary | ICD-10-CM | POA: Insufficient documentation

## 2019-05-06 DIAGNOSIS — Z952 Presence of prosthetic heart valve: Secondary | ICD-10-CM | POA: Diagnosis not present

## 2019-05-06 DIAGNOSIS — I2510 Atherosclerotic heart disease of native coronary artery without angina pectoris: Secondary | ICD-10-CM | POA: Diagnosis not present

## 2019-05-06 DIAGNOSIS — E1122 Type 2 diabetes mellitus with diabetic chronic kidney disease: Secondary | ICD-10-CM | POA: Insufficient documentation

## 2019-05-06 DIAGNOSIS — L0390 Cellulitis, unspecified: Secondary | ICD-10-CM | POA: Diagnosis present

## 2019-05-06 DIAGNOSIS — I350 Nonrheumatic aortic (valve) stenosis: Secondary | ICD-10-CM | POA: Insufficient documentation

## 2019-05-06 DIAGNOSIS — E785 Hyperlipidemia, unspecified: Secondary | ICD-10-CM | POA: Diagnosis not present

## 2019-05-06 DIAGNOSIS — Z8673 Personal history of transient ischemic attack (TIA), and cerebral infarction without residual deficits: Secondary | ICD-10-CM | POA: Insufficient documentation

## 2019-05-06 DIAGNOSIS — Z951 Presence of aortocoronary bypass graft: Secondary | ICD-10-CM | POA: Diagnosis not present

## 2019-05-06 DIAGNOSIS — H3530 Unspecified macular degeneration: Secondary | ICD-10-CM | POA: Insufficient documentation

## 2019-05-06 DIAGNOSIS — I129 Hypertensive chronic kidney disease with stage 1 through stage 4 chronic kidney disease, or unspecified chronic kidney disease: Secondary | ICD-10-CM | POA: Insufficient documentation

## 2019-05-06 DIAGNOSIS — M79605 Pain in left leg: Secondary | ICD-10-CM | POA: Diagnosis not present

## 2019-05-06 DIAGNOSIS — H548 Legal blindness, as defined in USA: Secondary | ICD-10-CM | POA: Diagnosis not present

## 2019-05-06 DIAGNOSIS — M109 Gout, unspecified: Secondary | ICD-10-CM | POA: Diagnosis not present

## 2019-05-06 DIAGNOSIS — Z03818 Encounter for observation for suspected exposure to other biological agents ruled out: Secondary | ICD-10-CM | POA: Diagnosis not present

## 2019-05-06 DIAGNOSIS — E1159 Type 2 diabetes mellitus with other circulatory complications: Secondary | ICD-10-CM

## 2019-05-06 DIAGNOSIS — Z794 Long term (current) use of insulin: Secondary | ICD-10-CM | POA: Diagnosis not present

## 2019-05-06 DIAGNOSIS — Z20828 Contact with and (suspected) exposure to other viral communicable diseases: Secondary | ICD-10-CM | POA: Insufficient documentation

## 2019-05-06 DIAGNOSIS — E11319 Type 2 diabetes mellitus with unspecified diabetic retinopathy without macular edema: Secondary | ICD-10-CM | POA: Insufficient documentation

## 2019-05-06 DIAGNOSIS — Z79899 Other long term (current) drug therapy: Secondary | ICD-10-CM | POA: Insufficient documentation

## 2019-05-06 DIAGNOSIS — Z87891 Personal history of nicotine dependence: Secondary | ICD-10-CM | POA: Diagnosis not present

## 2019-05-06 DIAGNOSIS — R531 Weakness: Secondary | ICD-10-CM | POA: Diagnosis not present

## 2019-05-06 DIAGNOSIS — L03116 Cellulitis of left lower limb: Principal | ICD-10-CM | POA: Insufficient documentation

## 2019-05-06 LAB — I-STAT CHEM 8, ED
BUN: 20 mg/dL (ref 8–23)
Calcium, Ion: 1.21 mmol/L (ref 1.15–1.40)
Chloride: 107 mmol/L (ref 98–111)
Creatinine, Ser: 1.9 mg/dL — ABNORMAL HIGH (ref 0.61–1.24)
Glucose, Bld: 89 mg/dL (ref 70–99)
HCT: 46 % (ref 39.0–52.0)
Hemoglobin: 15.6 g/dL (ref 13.0–17.0)
Potassium: 3.9 mmol/L (ref 3.5–5.1)
Sodium: 142 mmol/L (ref 135–145)
TCO2: 22 mmol/L (ref 22–32)

## 2019-05-06 LAB — COMPREHENSIVE METABOLIC PANEL
ALT: 27 U/L (ref 0–44)
AST: 41 U/L (ref 15–41)
Albumin: 4 g/dL (ref 3.5–5.0)
Alkaline Phosphatase: 67 U/L (ref 38–126)
Anion gap: 9 (ref 5–15)
BUN: 22 mg/dL (ref 8–23)
CO2: 21 mmol/L — ABNORMAL LOW (ref 22–32)
Calcium: 9.3 mg/dL (ref 8.9–10.3)
Chloride: 109 mmol/L (ref 98–111)
Creatinine, Ser: 2.07 mg/dL — ABNORMAL HIGH (ref 0.61–1.24)
GFR calc Af Amer: 33 mL/min — ABNORMAL LOW (ref >60)
GFR calc non Af Amer: 29 mL/min — ABNORMAL LOW (ref >60)
Glucose, Bld: 79 mg/dL (ref 70–99)
Potassium: 6 mmol/L — ABNORMAL HIGH (ref 3.5–5.1)
Sodium: 139 mmol/L (ref 135–145)
Total Bilirubin: 1 mg/dL (ref 0.3–1.2)
Total Protein: 7.1 g/dL (ref 6.5–8.1)

## 2019-05-06 LAB — CBC WITH DIFFERENTIAL/PLATELET
Abs Immature Granulocytes: 0.03 10*3/uL (ref 0.00–0.07)
Basophils Absolute: 0.1 10*3/uL (ref 0.0–0.1)
Basophils Relative: 1 %
Eosinophils Absolute: 0.4 10*3/uL (ref 0.0–0.5)
Eosinophils Relative: 4 %
HCT: 48.4 % (ref 39.0–52.0)
Hemoglobin: 16.1 g/dL (ref 13.0–17.0)
Immature Granulocytes: 0 %
Lymphocytes Relative: 21 %
Lymphs Abs: 2.2 10*3/uL (ref 0.7–4.0)
MCH: 29.4 pg (ref 26.0–34.0)
MCHC: 33.3 g/dL (ref 30.0–36.0)
MCV: 88.5 fL (ref 80.0–100.0)
Monocytes Absolute: 1 10*3/uL (ref 0.1–1.0)
Monocytes Relative: 9 %
Neutro Abs: 6.9 10*3/uL (ref 1.7–7.7)
Neutrophils Relative %: 65 %
Platelets: 217 10*3/uL (ref 150–400)
RBC: 5.47 MIL/uL (ref 4.22–5.81)
RDW: 14.4 % (ref 11.5–15.5)
WBC: 10.7 10*3/uL — ABNORMAL HIGH (ref 4.0–10.5)
nRBC: 0 % (ref 0.0–0.2)

## 2019-05-06 LAB — GLUCOSE, CAPILLARY: Glucose-Capillary: 139 mg/dL — ABNORMAL HIGH (ref 70–99)

## 2019-05-06 LAB — MRSA PCR SCREENING: MRSA by PCR: POSITIVE — AB

## 2019-05-06 MED ORDER — VANCOMYCIN HCL 10 G IV SOLR
2000.0000 mg | INTRAVENOUS | Status: AC
  Administered 2019-05-06: 2000 mg via INTRAVENOUS
  Filled 2019-05-06: qty 2000

## 2019-05-06 MED ORDER — AMLODIPINE BESYLATE 10 MG PO TABS
10.0000 mg | ORAL_TABLET | Freq: Every day | ORAL | Status: DC
  Administered 2019-05-07: 10 mg via ORAL
  Filled 2019-05-06: qty 1

## 2019-05-06 MED ORDER — METOPROLOL TARTRATE 50 MG PO TABS
50.0000 mg | ORAL_TABLET | Freq: Two times a day (BID) | ORAL | Status: DC
  Administered 2019-05-06 – 2019-05-07 (×2): 50 mg via ORAL
  Filled 2019-05-06 (×2): qty 1

## 2019-05-06 MED ORDER — ENOXAPARIN SODIUM 40 MG/0.4ML ~~LOC~~ SOLN: 40.0000 mg | SUBCUTANEOUS | Status: DC

## 2019-05-06 MED ORDER — VANCOMYCIN HCL IN DEXTROSE 1-5 GM/200ML-% IV SOLN: 1000.0000 mg | INTRAVENOUS | Status: DC

## 2019-05-06 MED ORDER — SODIUM CHLORIDE 0.9 % IV BOLUS
500.0000 mL | Freq: Once | INTRAVENOUS | Status: AC
  Administered 2019-05-06: 14:00:00 500 mL via INTRAVENOUS

## 2019-05-06 MED ORDER — SODIUM CHLORIDE 0.9 % IV SOLN
INTRAVENOUS | Status: DC | PRN
  Administered 2019-05-06: 250 mL via INTRAVENOUS

## 2019-05-06 MED ORDER — FONDAPARINUX SODIUM 2.5 MG/0.5ML ~~LOC~~ SOLN
2.5000 mg | Freq: Every day | SUBCUTANEOUS | Status: DC
  Administered 2019-05-06: 2.5 mg via SUBCUTANEOUS
  Filled 2019-05-06 (×2): qty 0.5

## 2019-05-06 MED ORDER — INSULIN ASPART 100 UNIT/ML ~~LOC~~ SOLN
0.0000 [IU] | Freq: Three times a day (TID) | SUBCUTANEOUS | Status: DC
  Administered 2019-05-07 (×2): 2 [IU] via SUBCUTANEOUS
  Filled 2019-05-06: qty 0.15

## 2019-05-06 MED ORDER — SODIUM CHLORIDE 0.9 % IV SOLN
2.0000 g | Freq: Two times a day (BID) | INTRAVENOUS | Status: DC
  Administered 2019-05-06 – 2019-05-07 (×2): 2 g via INTRAVENOUS
  Filled 2019-05-06 (×3): qty 2

## 2019-05-06 NOTE — Progress Notes (Signed)
A consult was received from an ED physician for Vancomycin per pharmacy dosing.  The patient's profile has been reviewed for ht/wt/allergies/indication/available labs.    A one time order has been placed for Vancomycin 2g IV.  Further antibiotics/pharmacy consults should be ordered by admitting physician if indicated.                       Thank you, Luiz Ochoa 05/06/2019  2:30 PM

## 2019-05-06 NOTE — H&P (Addendum)
History and Physical    Johnny [name] DOB: Mar 31, 1933 DOA: 05/06/2019  PCP: Merri BrunettePharr, Walter, MD Patient coming from home  Chief Complaint: Cellulitis HPI: Johnny [name] is a 85 y.o. male with medical history significant of type 2 diabetes, aortic stenosis, BPH, diabetic retinopathy, macular degeneration, hypertension, gout admitted with left lower extremity edema and pain for the last 1 day.  Wife is concerned it could be an infection so she brought him to the ER.  Denies any fever or chills.  He has history of cellulitis many years ago.  Reports his diabetes is fairly controlled at home.  No nausea vomiting diarrhea abdominal pain chest pain shortness of breath cough reported no sick contacts. No history of DVT or PE.  Has history of Pseudomonas bacteremia in 2018  ED Course: Patient received vancomycin.  Blood pressure 188/86 pulse of 67 respirations 16 temperature 98 sats 95% on room air. Sodium 142 potassium 3.9, initial potassium was 6.  BUN 20 creatinine 1.90.  For some reason it was repeated in the ER.  Initial creatinine 2.07.  He received 500 cc of IV fluids.  Review of Systems: As per HPI otherwise all other systems reviewed and are negative  Ambulatory Status: Ambulatory at baseline has unsteady gait  Past Medical History:  Diagnosis Date  . Aortic stenosis   . BPH (benign prostatic hypertrophy)   . Branch retinal vein occlusion 08/01/2013  . CAD (coronary artery disease)   . CVA (cerebral infarction)   . Diabetes mellitus (HCC)   . Diabetic retinopathy (HCC)   . Gait disturbance   . GERD (gastroesophageal reflux disease)   . Gout   . Heart murmur   . HTN (hypertension)   . Macular degeneration   . Macular degeneration   . Macular edema   . Paresthesia   . Pseudophakia of right eye   . Ptosis     Past Surgical History:  Procedure Laterality Date  . AORTIC VALVE REPLACEMENT  03/26/2013  . CATARACT EXTRACTION W/ INTRAOCULAR LENS IMPLANT   11/01/2013  . CATARACT EXTRACTION W/ INTRAOCULAR LENS IMPLANT Right 12/18/2009  . COLONOSCOPY W/ POLYPECTOMY    . CORONARY ARTERY BYPASS GRAFT  03/26/2013  . EYE SURGERY  03/19/2016   Ocular tube shunt insertion  . PARS PLANA VITRECTOMY  03/19/2016  . PARS PLANA VITRECTOMY  11/08/2013  . PARS PLANA VITRECTOMY  08/02/2012  . PROSTATE SURGERY    . TEE WITHOUT CARDIOVERSION N/A 05/02/2017   Procedure: TRANSESOPHAGEAL ECHOCARDIOGRAM (TEE);  Surgeon: Lars MassonNelson, Katarina H, MD;  [facility];  Service: Cardiovascular;  Laterality: N/A;    Social History   Socioeconomic History  . Marital status: Married    Spouse name: Not on file  . Number of children: Not on file  . Years of education: Not on file  . Highest education level: Not on file  Occupational History  . Not on file  Social Needs  . Financial resource strain: Not on file  . Food insecurity    Worry: Not on file    Inability: Not on file  . Transportation needs    Medical: Not on file    Non-medical: Not on file  Tobacco Use  . Smoking status: Former Smoker    Packs/day: 1.00    Years: 40.00    Pack years: 40.00    Types: Cigarettes  . Smokeless tobacco: Never Used  Substance and Sexual Activity  . Alcohol use: No    Alcohol/week: 0.0 standard drinks  .  Drug use: No  . Sexual activity: Never  Lifestyle  . Physical activity    Days per week: Not on file    Minutes per session: Not on file  . Stress: Not on file  Relationships  . Social Musician on phone: Not on file    Gets together: Not on file    Attends religious service: Not on file    Active member of club or organization: Not on file    Attends meetings of clubs or organizations: Not on file    Relationship status: Not on file  . Intimate partner violence    Fear of current or ex partner: Not on file    Emotionally abused: Not on file    Physically abused: Not on file    Forced sexual activity: Not on file  Other Topics Concern   . Not on file  Social History Narrative  . Not on file    Allergies  Allergen Reactions  . Glyxambi [Empagliflozin-Linagliptin] Other (See Comments)    Lethargy, slurring speaking.   . Heparin     Other reaction(s): Heparin-Induced Thrombocytopenia    Family History  Problem Relation Age of Onset  . Breast cancer Sister   . Lung cancer Brother       Prior to Admission medications   Medication Sig Start Date End Date Taking? Authorizing Provider  allopurinol (ZYLOPRIM) 100 MG tablet Take 200 mg by mouth at bedtime.  11/07/11  Yes [provider]  amLODipine (NORVASC) 10 MG tablet Take 10 mg by mouth daily.   Yes [provider]  aspirin 81 MG chewable tablet Chew 81 mg by mouth daily.   Yes [provider]  Cholecalciferol (VITAMIN D) 125 MCG (5000 UT) CAPS Take 1 tablet by mouth daily.   Yes [provider]  docusate sodium (COLACE) 100 MG capsule Take 100 mg by mouth 2 (two) times daily.   Yes [provider]  ferrous sulfate 325 (65 FE) MG tablet Take 325 mg by mouth 2 (two) times daily. 02/22/17  Yes [provider]  furosemide (LASIX) 40 MG tablet Take 40 mg by mouth daily.    Yes [provider]  hydrALAZINE (APRESOLINE) 50 MG tablet Take 50 mg by mouth 3 (three) times daily.  12/26/11  Yes [provider]  insulin glargine (LANTUS) 100 UNIT/ML injection Inject 0.35 mLs (35 Units total) into the skin 2 (two) times daily. Patient taking differently: Inject 35 Units into the skin at bedtime.  05/07/17  Yes Regalado, Belkys A, MD  insulin lispro (HUMALOG) 100 UNIT/ML injection Inject 10-16 Units into the skin 3 (three) times daily before meals. Below 200 units = 10units 200-250=12 units 250-300=14units Over 300=16units   Yes [provider]  metoprolol (LOPRESSOR) 50 MG tablet Take 50 mg by mouth 2 (two) times daily.   Yes [provider]  Omeprazole-Sodium Bicarbonate (ZEGERID) 20-1100 MG  CAPS capsule Take 1 capsule by mouth daily before breakfast.   Yes [provider]  rosuvastatin (CRESTOR) 10 MG tablet Take 10 mg by mouth daily.   Yes [provider]  senna (SENOKOT) 8.6 MG TABS tablet Take 1 tablet by mouth daily.   Yes [provider]  zinc gluconate 50 MG tablet Take 50 mg by mouth daily.   Yes [provider]  acetaminophen (TYLENOL) 325 MG tablet Take 2 tablets (650 mg total) by mouth every 6 (six) hours as needed for mild pain (or Fever >/=  101). Patient not taking: Reported on 05/06/2019 05/07/17   Hartley Barefoot A, MD  aspirin 325 MG tablet Take 1 tablet (325 mg total) by mouth daily. Patient not taking: Reported on 05/06/2019 05/08/17   Regalado, Jon Billings A, MD  atorvastatin (LIPITOR) 20 MG tablet Take 1 tablet (20 mg total) by mouth daily at 6 PM. Patient not taking: Reported on 05/06/2019 05/07/17   Regalado, Jon Billings A, MD  colchicine 0.6 MG tablet Take 1 tablet (0.6 mg total) by mouth daily. Patient not taking: Reported on 05/06/2019 05/08/17   Regalado, Jon Billings A, MD  pantoprazole (PROTONIX) 40 MG tablet Take 1 tablet (40 mg total) by mouth daily. Patient not taking: Reported on 05/06/2019 05/08/17   Alba Cory, MD    Physical Exam: Vitals:   05/06/19 1424 05/06/19 1430 05/06/19 1530 05/06/19 1700  BP:  (!) 171/75 (!) 182/75 (!) 188/86  Pulse:  65 67 67  Resp:  Temp:      SpO2:  95% 96% 95%  Weight: 92.5 kg     Height:  (1.727 m)        . General:  Appears calm and comfortable . Eyes:  PERRL, EOMI, normal lids, iris . ENT:  grossly normal hearing, lips & tongue, mmm . Neck:  no LAD, masses or thyromegaly . Cardiovascular: RRR, no m/r/g. No LE edema.  Marland Kitchen Respiratory:  CTA bilaterally, no w/r/r. Normal respiratory effort. . Abdomen:  soft, ntnd, NABS . Skin:  no rash or induration seen on limited exam . Musculoskeletal: Left lower extremity erythema edema tender to touch small open area noted  chronic venous stasis changes in both lower extremities pulses intact . Psychiatric:  grossly normal mood and affect, speech fluent and appropriate, AOx3 . Neurologic:  CN 2-12 grossly intact, moves all extremities in coordinated fashion, sensation intact  Labs on Admission: I have personally reviewed following labs and imaging studies  CBC: Recent Labs  Lab 05/06/19 1400 05/06/19 1617  WBC 10.7*  --   NEUTROABS 6.9  --   HGB 16.1 15.6  HCT 48.4 46.0  MCV 88.5  --   PLT 217  --    Basic Metabolic Panel: Recent Labs  Lab 05/06/19 1400 05/06/19 1617  NA 139 142  K 6.0* 3.9  CL 109 107  CO2 21*  --   GLUCOSE 79 89  BUN 22 20  CREATININE 2.07* 1.90*  CALCIUM 9.3  --    GFR: Estimated Creatinine Clearance: 31.9 mL/min (A) (by C-G formula based on SCr of 1.9 mg/dL (H)). Liver Function Tests: Recent Labs  Lab 05/06/19 1400  AST 41  ALT 27  ALKPHOS 67  BILITOT 1.0  PROT 7.1  ALBUMIN 4.0   No results for input(s): LIPASE, AMYLASE in the last 168 hours. No results for input(s): AMMONIA in the last 168 hours. Coagulation Profile: No results for input(s): INR, PROTIME in the last 168 hours. Cardiac Enzymes: No results for input(s): CKTOTAL, CKMB, CKMBINDEX, TROPONINI in the last 168 hours. BNP (last 3 results) No results for input(s): PROBNP in the last 8760 hours. HbA1C: No results for input(s): HGBA1C in the last 72 hours. CBG: No results for input(s): GLUCAP in the last 168 hours. Lipid Profile: No results for input(s): CHOL, HDL, LDLCALC, TRIG, CHOLHDL, LDLDIRECT in the last 72 hours. Thyroid Function Tests: No results for input(s): TSH, T4TOTAL, FREET4, T3FREE, THYROIDAB in the last 72 hours. Anemia Panel: No results for input(s): VITAMINB12, FOLATE, FERRITIN, TIBC, IRON,  RETICCTPCT in the last 72 hours. Urine analysis:    Component Value Date/Time   COLORURINE YELLOW 04/30/2017 1731   APPEARANCEUR CLOUDY (A) 04/30/2017 1731   LABSPEC 1.016 04/30/2017 1731    PHURINE 5.0 04/30/2017 1731   GLUCOSEU 50 (A) 04/30/2017 1731   HGBUR SMALL (A) 04/30/2017 1731   BILIRUBINUR NEGATIVE 04/30/2017 1731   KETONESUR NEGATIVE 04/30/2017 1731   PROTEINUR 100 (A) 04/30/2017 1731   UROBILINOGEN 0.2 01/02/2014 2236   NITRITE NEGATIVE 04/30/2017 1731   LEUKOCYTESUR LARGE (A) 04/30/2017 1731    Creatinine Clearance: Estimated Creatinine Clearance: 31.9 mL/min (A) (by C-G formula based on SCr of 1.9 mg/dL (H)).  Sepsis Labs: @LABRCNTIP (procalcitonin:4,lacticidven:4) )No results found for this or any previous visit (from the past 240 hour(s)).   Radiological Exams on Admission: No results found.   Assessment/Plan Active Problems:   Cellulitis   #1 left lower extremity cellulitis in a patient with type 2 diabetes and venous insufficiency. IV antibiotics Vanco and cefepime patient has history of Pseudomonas bacteremia check MRSA PCR Venous Doppler of the left lower extremity   #2 type 2 diabetes-continue insulin check hemoglobin A1c he is on Lantus 35 units nightly with sliding scale insulin Humalog.  We will keep him on SSI and Lantus.  #3 gout continue allopurinol   #4 history of stroke continue aspirin  #5 history of aortic stenosis  #6 hypertension continue Norvasc and Lasix 40 mg daily with hydralazine 50 mg 3 times a day and Lopressor 50 mg twice a day  #7 hyperlipidemia continue statin  #8 history of stroke continue aspirin  #9 elevated abnormal creatinine patient does not carry a history of CKD however he has longstanding diabetes and hypertension and gout.  Monitor closely on vancomycin.  #10 DVT prophylaxis patient has history of HIT.  I will start him on Arixtra 2.5 mg daily for DVT prophylaxis.  His creatinine clearance is 31.9.  Monitor closely.  Severity of Illness: The appropriate patient status for this patient is OBSERVATION. Observation status is judged to be reasonable and necessary in order to provide the required intensity  of service to ensure the patient's safety. The patient's presenting symptoms, physical exam findings, and initial radiographic and laboratory data in the context of their medical condition is felt to place them at decreased risk for further clinical deterioration. Furthermore, it is anticipated that the patient will be medically stable for discharge from the hospital within 2 midnights of admission. The following factors support the patient status of observation.   " The patient's presenting symptoms include .  Left lower extremity edema and pain " The physical exam findings include left lower extremity edema erythema tenderness with chronic venous stasis changes " The initial radiographic and laboratory data are leukocytosis    Estimated body mass index is 31.02 kg/m as calculated from the following:   Height as of this encounter: 5\' 8"  (1.727 m).   Weight as of this encounter: 92.5 kg.   DVT prophylaxis: arixtra patient has history of HIT. Code Status: Full code Family Communication: Discussed with wife in the room Disposition Plan: Pending clinical improvement Consults called: None Admission status: Observation   Georgette Shell MD [facility]  If 7PM-7AM, please contact night-coverage [email or web address] Password [email or web address]  05/06/2019, 5:13 PM

## 2019-05-06 NOTE — ED Triage Notes (Signed)
Pt has redness and blisters on left lower leg. Pt has had cellulitis in the past and wife is concerned that the same is occurring today.

## 2019-05-06 NOTE — ED Provider Notes (Signed)
[facility]-EMERGENCY DEPT Provider Note   CSN: [id number] Arrival date & time: 05/06/19  1303     History   Chief Complaint Chief Complaint  Patient presents with  . Cellulitis    HPI Johnny [name] is a 85 y.o. male.     HPI  He is here for evaluation of progressive swelling with blistering of the left lower leg.  Similar problem in the past with cellulitis.  No associated fever, chills, vomiting, weakness or dizziness.  His appetite has been good.  His blood sugar has been trending normal.  He has been able to ambulate.  There are no other known modifying factors.  Past Medical History:  Diagnosis Date  . Aortic stenosis   . BPH (benign prostatic hypertrophy)   . Branch retinal vein occlusion 08/01/2013  . CAD (coronary artery disease)   . CVA (cerebral infarction)   . Diabetes mellitus (HCC)   . Diabetic retinopathy (HCC)   . Gait disturbance   . GERD (gastroesophageal reflux disease)   . Gout   . Heart murmur   . HTN (hypertension)   . Macular degeneration   . Macular degeneration   . Macular edema   . Paresthesia   . Pseudophakia of right eye   . Ptosis     Patient Active Problem List   Diagnosis Date Noted  . Pseudophakia of right eye   . Macular edema   . Heart murmur   . Gout   . GERD (gastroesophageal reflux disease)   . Diabetic retinopathy (HCC)   . CAD (coronary artery disease)   . UTI (urinary tract infection)   . Bacteremia   . Leukocytosis   . Hyperlipidemia LDL goal <70   . Legally blind   . Acute ischemic stroke (HCC) 04/27/2017  . Stroke (cerebrum) (HCC) 04/27/2017  . Vertigo 04/25/2017  . CKD (chronic kidney disease) stage 3, GFR 30-59 ml/min 04/25/2017  . Bunion 07/26/2014  . Acute encephalopathy 01/03/2014  . Aortic valve replaced 01/03/2014  . CAD in native artery 01/03/2014  . Fever 01/02/2014  . Pain in lower limb 10/26/2013  . Branch retinal vein occlusion 08/01/2013  . Onychomycosis 11/17/2012  .  Pain in joint, ankle and foot 11/17/2012  . Aortic stenosis 02/25/2012  . Abnormal ECG 02/25/2012  . HTN (hypertension)   . Paresthesia   . Diabetes mellitus (HCC)   . Macular degeneration   . CVA (cerebral infarction)   . Ptosis   . Gait disturbance     Past Surgical History:  Procedure Laterality Date  . AORTIC VALVE REPLACEMENT  03/26/2013  . CATARACT EXTRACTION W/ INTRAOCULAR LENS IMPLANT  11/01/2013  . CATARACT EXTRACTION W/ INTRAOCULAR LENS IMPLANT Right 12/18/2009  . COLONOSCOPY W/ POLYPECTOMY    . CORONARY ARTERY BYPASS GRAFT  03/26/2013  . EYE SURGERY  03/19/2016   Ocular tube shunt insertion  . PARS PLANA VITRECTOMY  03/19/2016  . PARS PLANA VITRECTOMY  11/08/2013  . PARS PLANA VITRECTOMY  08/02/2012  . PROSTATE SURGERY    . TEE WITHOUT CARDIOVERSION N/A 05/02/2017   Procedure: TRANSESOPHAGEAL ECHOCARDIOGRAM (TEE);  Surgeon: Lars Masson, MD;  Location: [facility] ENDOSCOPY;  Service: Cardiovascular;  Laterality: N/A;        Home Medications    Prior to Admission medications   Medication Sig Start Date End Date Taking? Authorizing Provider  allopurinol (ZYLOPRIM) 100 MG tablet Take 200 mg by mouth at bedtime.  11/07/11  Yes [provider]  amLODipine (NORVASC)  10 MG tablet Take 10 mg by mouth daily.   Yes [provider]  aspirin 81 MG chewable tablet Chew 81 mg by mouth daily.   Yes [provider]  Cholecalciferol (VITAMIN D) 125 MCG (5000 UT) CAPS Take 1 tablet by mouth daily.   Yes [provider]  docusate sodium (COLACE) 100 MG capsule Take 100 mg by mouth 2 (two) times daily.   Yes [provider]  ferrous sulfate 325 (65 FE) MG tablet Take 325 mg by mouth 2 (two) times daily. 02/22/17  Yes [provider]  furosemide (LASIX) 40 MG tablet Take 40 mg by mouth daily.    Yes [provider]  hydrALAZINE (APRESOLINE) 50 MG tablet Take 50 mg by mouth 3 (three) times daily.  12/26/11  Yes [provider]  insulin glargine (LANTUS) 100 UNIT/ML injection Inject 0.35 mLs (35 Units total) into the skin 2 (two) times daily. Patient taking differently: Inject 35 Units into the skin at bedtime.  05/07/17  Yes Regalado, Belkys A, MD  insulin lispro (HUMALOG) 100 UNIT/ML injection Inject 10-16 Units into the skin 3 (three) times daily before meals. Below 200 units = 10units 200-250=12 units 250-300=14units Over 300=16units   Yes [provider]  metoprolol (LOPRESSOR) 50 MG tablet Take 50 mg by mouth 2 (two) times daily.   Yes [provider]  Omeprazole-Sodium Bicarbonate (ZEGERID) 20-1100 MG CAPS capsule Take 1 capsule by mouth daily before breakfast.   Yes [provider]  rosuvastatin (CRESTOR) 10 MG tablet Take 10 mg by mouth daily.   Yes [provider]  senna (SENOKOT) 8.6 MG TABS tablet Take 1 tablet by mouth daily.   Yes [provider]  zinc gluconate 50 MG tablet Take 50 mg by mouth daily.   Yes [provider]  acetaminophen (TYLENOL) 325 MG tablet Take 2 tablets (650 mg total) by mouth every 6 (six) hours as needed for mild pain (or Fever >/= 101). Patient not taking: Reported on 05/06/2019 05/07/17   Niel Hummer A, MD  aspirin 325 MG tablet Take 1 tablet (325 mg total) by mouth daily. Patient not taking: Reported on 05/06/2019 05/08/17   Regalado, Jerald Kief A, MD  atorvastatin (LIPITOR) 20 MG tablet Take 1 tablet (20 mg total) by mouth daily at 6 PM. Patient not taking: Reported on 05/06/2019 05/07/17   Regalado, Jerald Kief A, MD  colchicine 0.6 MG tablet Take 1 tablet (0.6 mg total) by mouth daily. Patient not taking: Reported on 05/06/2019 05/08/17   Regalado, Jerald Kief A, MD  pantoprazole (PROTONIX) 40 MG tablet Take 1 tablet (40 mg total) by mouth daily. Patient not taking: Reported on 05/06/2019 05/08/17   Elmarie Shiley, MD    Family History Family History  Problem Relation Age of Onset  . Breast cancer Sister    . Lung cancer Brother     Social History Social History   Tobacco Use  . Smoking status: Former Smoker    Packs/day: 1.00    Years: 40.00    Pack years: 40.00    Types: Cigarettes  . Smokeless tobacco: Never Used  Substance Use Topics  . Alcohol use: No    Alcohol/week: 0.0 standard drinks  . Drug use: No     Allergies   Glyxambi [empagliflozin-linagliptin] and Heparin   Review of Systems Review of Systems  All other systems reviewed and are negative.    Physical Exam Updated Vital Signs BP (!) 182/75   Pulse 67  Temp 98 F (36.7 C)   Resp 18   Ht  (1.727 m)   Wt 92.5 kg   SpO2 96%   BMI 31.02 kg/m   Physical Exam Vitals signs and nursing note reviewed.  Constitutional:      General: He is not in acute distress.    Appearance: He is well-developed. He is not ill-appearing, toxic-appearing or diaphoretic.  HENT:     Head: Normocephalic and atraumatic.     Right Ear: External ear normal.     Left Ear: External ear normal.  Eyes:     Conjunctiva/sclera: Conjunctivae normal.     Pupils: Pupils are equal, round, and reactive to light.  Neck:     Musculoskeletal: Normal range of motion and neck supple.     Trachea: Phonation normal.  Cardiovascular:     Rate and Rhythm: Normal rate.  Pulmonary:     Effort: Pulmonary effort is normal.  Musculoskeletal:     Comments: Left lower leg from mid shin to foot, mildly swollen, with areas of clear drainage and a few blisters at the posterior ankle region.  This area is also somewhat discolored, purplish red.  This examination is consistent with localized cellulitis of the left lower leg.  There is no significant calf tenderness.  There is no tenderness or mass behind the left knee.  Skin:    General: Skin is warm and dry.  Neurological:     Mental Status: He is alert and oriented to person, place, and time.     Cranial Nerves: No cranial nerve deficit.     Sensory: No sensory deficit.     Motor: No  abnormal muscle tone.     Coordination: Coordination normal.  Psychiatric:        Mood and Affect: Mood normal.        Behavior: Behavior normal.        Thought Content: Thought content normal.        Judgment: Judgment normal.      ED Treatments / Results  Labs (all labs ordered are listed, but only abnormal results are displayed) Labs Reviewed  COMPREHENSIVE METABOLIC PANEL - Abnormal; Notable for the following components:      Result Value   Potassium 6.0 (*)    CO2 21 (*)    Creatinine, Ser 2.07 (*)    GFR calc non Af Amer 29 (*)    GFR calc Af Amer 33 (*)    All other components within normal limits  CBC WITH DIFFERENTIAL/PLATELET - Abnormal; Notable for the following components:   WBC 10.7 (*)    All other components within normal limits  I-STAT CHEM 8, ED - Abnormal; Notable for the following components:   Creatinine, Ser 1.90 (*)    All other components within normal limits  SARS CORONAVIRUS 2 (TAT 6-24 HRS)    EKG None  Radiology No results found.  Procedures Procedures (including critical care time)  Medications Ordered in ED Medications  vancomycin (VANCOCIN) 2,000 mg in sodium chloride 0.9 % 500 mL IVPB (2,000 mg Intravenous New Bag/Given 05/06/19 1455)  sodium chloride 0.9 % bolus 500 mL (0 mLs Intravenous Stopped 05/06/19 1455)     Initial Impression / Assessment and Plan / ED Course  I have reviewed the triage vital signs and the nursing notes.  Pertinent labs & imaging results that were available during my care of the patient were reviewed by me and considered in my medical decision making (see chart for  details).  Clinical Course as of May 06 1639  Sun May 06, 2019  1623 I-stat chem 8, ED [facility]!) [EW]  1623 Normal except creatinine elevated   [EW]  1623 Normal except white count low  CBC with Differential(!) [EW]  1623 Normal except potassium elevated, CO2 low, creatinine elevated  Comprehensive metabolic panel(!) [EW]     Clinical Course User Index [EW] Mancel BaleWentz, Kostas Marrow, MD        Patient Vitals for the past 24 hrs:  BP Temp Pulse Resp SpO2 Height Weight  05/06/19 1530 (!) 182/75 - 67 18 96 % - -  05/06/19 1430 (!) 171/75 - 65 16 95 % - -  05/06/19 1424 - - - - - 5\' 8"  (1.727 m) 92.5 kg  05/06/19 1420 (!) 184/86 - 69 18 94 % - -  05/06/19 1314 (!) 182/78 98 F (36.7 C) 64 16 96 % - -    4:40 PM Reevaluation with update and discussion. After initial assessment and treatment, an updated evaluation reveals no change in clinical status, findings discussed with patient and wife, all questions answered. Mancel BaleElliott Chee Kinslow   Medical Decision Making: Cellulitis left lower leg, diabetic patient, recurrent problem.  Initial screening labs concerning for hyperkalemia, repeated with potassium normal at 1617.  Mild baseline renal insufficiency.  White count slightly elevated.  Patient with open draining wounds of the left lower leg associated with blistering and localized swelling.  Doubt DVT.  Doubt lymphangitis.  Doubt osteomyelitis.  Hospitalist will be contacted for hospitalization for observation and monitoring.  CRITICAL CARE-no Performed by: Mancel BaleElliott Sherran Margolis   Nursing Notes Reviewed/ Care Coordinated Applicable Imaging Reviewed Interpretation of Laboratory Data incorporated into ED treatment   4:40 PM-Consult complete with hospitalist. Patient case explained and discussed.  She agrees to admit patient for further evaluation and treatment. Call ended at 1630 PM  Plan: Admit     Final Clinical Impressions(s) / ED Diagnoses   Final diagnoses:  Left leg cellulitis    ED Discharge Orders    None       Mancel BaleWentz, Esthela Brandner, MD 05/06/19 1642

## 2019-05-06 NOTE — Progress Notes (Signed)
Pharmacy Antibiotic Note  Johnny [name] is a 85 y.o. male admitted on 05/06/2019 with cellulitis.  Pharmacy has been consulted for Vancomycin and Cefepime dosing.  Plan: Vancomycin 2g IV x 1 given in the ED. Continue with Vancomycin 1g IV q36h. Vancomycin levels at steady state, as indicated. Cefepime 2g IV q12h. Monitor renal function, cultures, clinical course.   Height: 5\' 8"  (172.7 cm) Weight: 204 lb (92.5 kg) IBW/kg (Calculated) : 68.4  Temp (24hrs), Avg:97.9 F (36.6 C), Min:97.7 F (36.5 C), Max:98 F (36.7 C)  Recent Labs  Lab 05/06/19 1400 05/06/19 1617  WBC 10.7*  --   CREATININE 2.07* 1.90*    Estimated Creatinine Clearance: 31.9 mL/min (A) (by C-G formula based on SCr of 1.9 mg/dL (H)).    Allergies  Allergen Reactions  . Glyxambi [Empagliflozin-Linagliptin] Other (See Comments)    Lethargy, slurring speaking.   . Heparin     Other reaction(s): Heparin-Induced Thrombocytopenia    Antimicrobials this admission: 10/25 Vancomycin >> 10/25 Cefepime >>  Microbiology results: 10/25 BCx: sent 10/25 MRSA PCR: sent 10/25 COVID: sent   Thank you for allowing pharmacy to be a part of this patient's care.    Lindell Spar, PharmD, BCPS Clinical Pharmacist  05/06/2019 7:10 PM

## 2019-05-06 NOTE — Progress Notes (Signed)
Pt's Wife has been given permission to visit with patient anytime d/t the pt being blind and depends on Wife at home for ADLs. CN notified, night RN notified.

## 2019-05-06 NOTE — Progress Notes (Addendum)
Landis Gandy, MD was paged regarding the pt's BP of 191/81, pulse 67. Pt denies pain. I will continue to monitor.   MD gave verbal orders for amlodipine 10 tablet daily and metoprolol 50 mg BID.

## 2019-05-07 ENCOUNTER — Observation Stay (HOSPITAL_BASED_OUTPATIENT_CLINIC_OR_DEPARTMENT_OTHER): Payer: Medicare Other

## 2019-05-07 DIAGNOSIS — L03116 Cellulitis of left lower limb: Secondary | ICD-10-CM | POA: Diagnosis not present

## 2019-05-07 DIAGNOSIS — R609 Edema, unspecified: Secondary | ICD-10-CM | POA: Diagnosis not present

## 2019-05-07 LAB — GLUCOSE, CAPILLARY
Glucose-Capillary: 128 mg/dL — ABNORMAL HIGH (ref 70–99)
Glucose-Capillary: 136 mg/dL — ABNORMAL HIGH (ref 70–99)

## 2019-05-07 LAB — COMPREHENSIVE METABOLIC PANEL
ALT: 20 U/L (ref 0–44)
AST: 20 U/L (ref 15–41)
Albumin: 3.3 g/dL — ABNORMAL LOW (ref 3.5–5.0)
Alkaline Phosphatase: 57 U/L (ref 38–126)
Anion gap: 7 (ref 5–15)
BUN: 21 mg/dL (ref 8–23)
CO2: 23 mmol/L (ref 22–32)
Calcium: 9.3 mg/dL (ref 8.9–10.3)
Chloride: 110 mmol/L (ref 98–111)
Creatinine, Ser: 1.8 mg/dL — ABNORMAL HIGH (ref 0.61–1.24)
GFR calc Af Amer: 39 mL/min — ABNORMAL LOW (ref >60)
GFR calc non Af Amer: 34 mL/min — ABNORMAL LOW (ref >60)
Glucose, Bld: 149 mg/dL — ABNORMAL HIGH (ref 70–99)
Potassium: 4 mmol/L (ref 3.5–5.1)
Sodium: 140 mmol/L (ref 135–145)
Total Bilirubin: 0.7 mg/dL (ref 0.3–1.2)
Total Protein: 6.2 g/dL — ABNORMAL LOW (ref 6.5–8.1)

## 2019-05-07 LAB — CBC
HCT: 46.1 % (ref 39.0–52.0)
Hemoglobin: 14.8 g/dL (ref 13.0–17.0)
MCH: 28.7 pg (ref 26.0–34.0)
MCHC: 32.1 g/dL (ref 30.0–36.0)
MCV: 89.3 fL (ref 80.0–100.0)
Platelets: 210 10*3/uL (ref 150–400)
RBC: 5.16 MIL/uL (ref 4.22–5.81)
RDW: 14.3 % (ref 11.5–15.5)
WBC: 9.4 10*3/uL (ref 4.0–10.5)
nRBC: 0 % (ref 0.0–0.2)

## 2019-05-07 LAB — SARS CORONAVIRUS 2 (TAT 6-24 HRS): SARS Coronavirus 2: NEGATIVE

## 2019-05-07 LAB — HEMOGLOBIN A1C
Hgb A1c MFr Bld: 5.5 % (ref 4.8–5.6)
Mean Plasma Glucose: 111.15 mg/dL

## 2019-05-07 MED ORDER — ASPIRIN 81 MG PO CHEW
81.0000 mg | CHEWABLE_TABLET | Freq: Every day | ORAL | Status: DC
  Administered 2019-05-07: 81 mg via ORAL
  Filled 2019-05-07: qty 1

## 2019-05-07 MED ORDER — ZINC GLUCONATE 50 MG PO TABS: 50.0000 mg | ORAL_TABLET | Freq: Every day | ORAL | Status: DC

## 2019-05-07 MED ORDER — DOCUSATE SODIUM 100 MG PO CAPS
100.0000 mg | ORAL_CAPSULE | Freq: Two times a day (BID) | ORAL | Status: DC
  Administered 2019-05-07: 100 mg via ORAL
  Filled 2019-05-07: qty 1

## 2019-05-07 MED ORDER — ALLOPURINOL 100 MG PO TABS
200.0000 mg | ORAL_TABLET | Freq: Every day | ORAL | Status: DC
  Filled 2019-05-07: qty 2

## 2019-05-07 MED ORDER — SENNA 8.6 MG PO TABS
1.0000 | ORAL_TABLET | Freq: Every day | ORAL | Status: DC
  Administered 2019-05-07: 8.6 mg via ORAL
  Filled 2019-05-07: qty 1

## 2019-05-07 MED ORDER — ROSUVASTATIN CALCIUM 10 MG PO TABS
10.0000 mg | ORAL_TABLET | Freq: Every day | ORAL | Status: DC
  Administered 2019-05-07: 11:00:00 10 mg via ORAL
  Filled 2019-05-07: qty 1

## 2019-05-07 MED ORDER — METOPROLOL TARTRATE 50 MG PO TABS: 50.0000 mg | ORAL_TABLET | Freq: Two times a day (BID) | ORAL | Status: DC

## 2019-05-07 MED ORDER — INSULIN GLARGINE 100 UNIT/ML ~~LOC~~ SOLN
5.0000 [IU] | Freq: Every day | SUBCUTANEOUS | Status: DC
  Filled 2019-05-07: qty 0.05

## 2019-05-07 MED ORDER — AMLODIPINE BESYLATE 10 MG PO TABS: 10.0000 mg | ORAL_TABLET | Freq: Every day | ORAL | Status: DC

## 2019-05-07 MED ORDER — INSULIN GLARGINE 100 UNIT/ML ~~LOC~~ SOLN: 35.0000 [IU] | Freq: Every day | SUBCUTANEOUS | Status: DC

## 2019-05-07 MED ORDER — CEPHALEXIN 500 MG PO CAPS: 500.0000 mg | ORAL_CAPSULE | Freq: Two times a day (BID) | ORAL | 0 refills | Status: AC

## 2019-05-07 MED ORDER — FUROSEMIDE 40 MG PO TABS
40.0000 mg | ORAL_TABLET | Freq: Every day | ORAL | Status: DC
  Administered 2019-05-07: 40 mg via ORAL
  Filled 2019-05-07: qty 1

## 2019-05-07 MED ORDER — HYDRALAZINE HCL 50 MG PO TABS
50.0000 mg | ORAL_TABLET | Freq: Three times a day (TID) | ORAL | Status: DC
  Administered 2019-05-07: 50 mg via ORAL
  Filled 2019-05-07: qty 1

## 2019-05-07 MED ORDER — VITAMIN D 25 MCG (1000 UNIT) PO TABS
5000.0000 [IU] | ORAL_TABLET | Freq: Every day | ORAL | Status: DC
  Administered 2019-05-07: 5000 [IU] via ORAL
  Filled 2019-05-07: qty 5

## 2019-05-07 NOTE — Progress Notes (Signed)
Pt alert and oriented, tolerating diet.  Compression stockings placed. D/C instructions given. Pt d/cd home.

## 2019-05-07 NOTE — Progress Notes (Signed)
Lower extremity venous has been completed.   Preliminary results in CV Proc.   Abram Sander 05/07/2019 8:27 AM

## 2019-05-07 NOTE — Discharge Instructions (Signed)

## 2019-05-07 NOTE — Evaluation (Signed)
Physical Therapy Evaluation-1x Patient Details Name: Johnny [name] MRN: [id number] DOB: June 22, 1933 Today's Date: 05/07/2019   History of Present Illness  85 yo male admitted with L LE cellulitis. Hx of CVA, macular degeneration, glaucoma, diabetic neuropathy, aortic stenosis, gout, CKD, vertigo, DM, CAD  Clinical Impression  On eval, pt was Min assist for mobility. He walked ~150 feet with a RW. Pt is unsteady. He requires directional cues 2* being legally blind. He required intermittent assist to maneuver RW safely on today. Wife was present during session. Discussed d/c plan-wife politely declines HHPT. Per chart, plan is for d/c home on today-1x eval.     Follow Up Recommendations No PT follow up;Supervision/Assistance - 24 hour(wife declines HHPT)    Equipment Recommendations  None recommended by PT    Recommendations for Other Services       Precautions / Restrictions Precautions Precautions: Fall Precaution Comments: legally blind Restrictions Weight Bearing Restrictions: No      Mobility  Bed Mobility Overal bed mobility: Needs Assistance Bed Mobility: Supine to Sit;Sit to Supine     Supine to sit: Supervision;HOB elevated Sit to supine: Supervision;HOB elevated      Transfers Overall transfer level: Needs assistance Equipment used: Rolling walker (2 wheeled) Transfers: Sit to/from Stand Sit to Stand: Min assist;From elevated surface         General transfer comment: Assist to rise, stabilize, control descent. VCs safety, hand placement.  Ambulation/Gait Ambulation/Gait assistance: Min assist Gait Distance (Feet): 150 Feet Assistive device: Rolling walker (2 wheeled) Gait Pattern/deviations: Step-through pattern;Decreased stride length;Trunk flexed     General Gait Details: Cues for safety, posture, distance from RW, direction. Assist to stabilize pt throughout distance and to maneuver RW intermittently.  Stairs            Wheelchair  Mobility    Modified Rankin (Stroke Patients Only)       Balance Overall balance assessment: Needs assistance         Standing balance support: Bilateral upper extremity supported Standing balance-Leahy Scale: Poor                               Pertinent Vitals/Pain Pain Assessment: No/denies pain    Home Living Family/patient expects to be discharged to:: Private residence Living Arrangements: Spouse/significant other Available Help at Discharge: Family;Available 24 hours/day Type of Home: House       Home Layout: One level Home Equipment: Welcome - 2 wheels;Bedside commode;Wheelchair - manual;Cane - single point;Shower seat - built in      Prior Function Level of Independence: Independent with assistive device(s)         Comments: using RW most recently. wife assists with ADLs     Hand Dominance        Extremity/Trunk Assessment   Upper Extremity Assessment Upper Extremity Assessment: Generalized weakness    Lower Extremity Assessment Lower Extremity Assessment: Generalized weakness    Cervical / Trunk Assessment Cervical / Trunk Assessment: Kyphotic  Communication   Communication: No difficulties  Cognition Arousal/Alertness: Awake/alert Behavior During Therapy: WFL for tasks assessed/performed Overall Cognitive Status: Within Functional Limits for tasks assessed                                        General Comments      Exercises     Assessment/Plan  PT Assessment Patent does not need any further PT services  PT Problem List Decreased strength;Decreased mobility;Decreased activity tolerance;Decreased balance;Decreased knowledge of use of DME;Decreased safety awareness       PT Treatment Interventions      PT Goals (Current goals can be found in the Care Plan section)  Acute Rehab PT Goals Patient Stated Goal: home PT Goal Formulation: All assessment and education complete, DC therapy    Frequency      Barriers to discharge        Co-evaluation               AM-PAC PT "6 Clicks" Mobility  Outcome Measure Help needed turning from your back to your side while in a flat bed without using bedrails?: A Little Help needed moving from lying on your back to sitting on the side of a flat bed without using bedrails?: A Little Help needed moving to and from a bed to a chair (including a wheelchair)?: A Little Help needed standing up from a chair using your arms (e.g., wheelchair or bedside chair)?: A Little Help needed to walk in hospital room?: A Little Help needed climbing 3-5 steps with a railing? : A Little 6 Click Score: 18    End of Session Equipment Utilized During Treatment: Gait belt Activity Tolerance: Patient tolerated treatment well Patient left: in bed;with call bell/phone within reach;with nursing/sitter in room;with family/visitor present        Time: 1355-1412 PT Time Calculation (min) (ACUTE ONLY): 17 min   Charges:   PT Evaluation $PT Eval Moderate Complexity: 1 Mod           Rebeca Alert, PT Acute Rehabilitation Services Pager: [phone] Office: [phone]

## 2019-05-07 NOTE — Discharge Summary (Addendum)
Physician Discharge Summary  Johnny [name] CWC:[id number] DOB: 28-Oct-1932  PCP: Deland Pretty, MD  Admitted from: Home Discharged to: Home  Admit date: 05/06/2019 Discharge date: 05/07/2019  Recommendations for Outpatient Follow-up:   Follow-up Information    Deland Pretty, MD. Schedule an appointment as soon as possible for a visit in 1 week(s).   Specialty: Internal Medicine Why: To be seen with repeat labs (CBC & BMP). Contact information: [place] [address] [phone]            Home Health: None. Equipment/Devices: None  Discharge Condition: Improved and stable CODE STATUS: Full Diet recommendation: Heart healthy & diabetic diet  Discharge Diagnoses:  Active Problems:   Cellulitis   Brief Summary: 85 year old married male, ambulates with a help of a walker, PMH of DM 2/IDDM with renal complications, HTN, aortic stenosis s/p AVR, BPH, CAD s/p CABG, CVA, GERD, gout, legal blindness from macular degeneration, presented with left lower extremity swelling and pain of 1 day duration couple of ruptured blisters.  Patient reportedly had an episode of cellulitis about 14 years ago for which she was hospitalized at [facility] for a week.  Wife was concerned that he may be having an infection so she brought him to the ED.  Patient denies fever, chills or pain.  He reportedly has had history of Pseudomonas bacteremia in 2018.  Spouse obtained compression stockings for leg swelling but patient reportedly had skin reaction to the stockings.  He was admitted for possible left leg cellulitis complicating venous insufficiency.  Assessment and plan:  1. Suspected chronic venous insufficiency/stasis of legs, couple of superficial ruptured blisters on left leg complicated by possible mild cellulitis: LLE venous Doppler negative for DVT.  Findings suspicious for chronic leg venous stasis, left > right.  He has 3 small areas of ruptured dry  blisters without drainage or any acute findings.  No erythema, increased warmth or tenderness.  Wound care consultation appreciated and recommend silicone foam dressing and nonallergic compression stockings.  He has completed approximately 2 days of IV vancomycin and cefepime which were chosen due to history of Pseudomonas bacteremia.  However clinically he appears to have no cellulitis or mild if at all.  He will be discharged on oral Keflex to complete total 5 days course.  He has excellent care and support from his spouse.  Close outpatient follow-up with PCP. 2. Type II DM/IDDM with renal complications: Continue prior home dose of Lantus and Humalog SSI.  A1c of 5.5 suggest good outpatient control. 3. Essential hypertension: Suspect difficult to control given polypharmacy medications that he is on at home PTA.  Continue prior home medications with outpatient follow-up with PCP. 4. CAD s/p CABG: No anginal symptoms.  Continue aspirin, statins and beta-blockers. 5. CVA: Continue aspirin and statins.  No focal deficits noted. 6. Gout: No acute flare.  Continue allopurinol. 7. Hyperlipidemia: Statins. 8. Stage III chronic kidney disease: Last known creatinine in [facility] is 2.13 in October 2018.  Presented with creatinine of 2.07 which has improved to 1.8 which may be his baseline.  Follow BMP closely as outpatient. 9. Legal blindness:   Consultations:  None  Procedures:  None   Discharge Instructions  Discharge Instructions    Call MD for:  extreme fatigue   Complete by: As directed    Call MD for:  persistant dizziness or light-headedness   Complete by: As directed    Call MD for:  redness, tenderness, or signs of infection (  pain, swelling, redness, odor or green/yellow discharge around incision site)   Complete by: As directed    Call MD for:  severe uncontrolled pain   Complete by: As directed    Call MD for:  temperature >100.4   Complete by: As directed    Diet - low sodium heart  healthy   Complete by: As directed    Diet Carb Modified   Complete by: As directed    Increase activity slowly   Complete by: As directed        Medication List    STOP taking these medications   acetaminophen 325 MG tablet Commonly known as: TYLENOL   atorvastatin 20 MG tablet Commonly known as: LIPITOR   colchicine 0.6 MG tablet   pantoprazole 40 MG tablet Commonly known as: PROTONIX     TAKE these medications   allopurinol 100 MG tablet Commonly known as: ZYLOPRIM Take 200 mg by mouth at bedtime.   amLODipine 10 MG tablet Commonly known as: NORVASC Take 10 mg by mouth daily.   aspirin 81 MG chewable tablet Chew 81 mg by mouth daily. What changed: Another medication with the same name was removed. Continue taking this medication, and follow the directions you see here.   cephALEXin 500 MG capsule Commonly known as: KEFLEX Take 1 capsule (500 mg total) by mouth 2 (two) times daily for 5 days.   docusate sodium 100 MG capsule Commonly known as: COLACE Take 100 mg by mouth 2 (two) times daily.   ferrous sulfate 325 (65 FE) MG tablet Take 325 mg by mouth 2 (two) times daily.   furosemide 40 MG tablet Commonly known as: LASIX Take 40 mg by mouth daily.   hydrALAZINE 50 MG tablet Commonly known as: APRESOLINE Take 50 mg by mouth 3 (three) times daily.   insulin glargine 100 UNIT/ML injection Commonly known as: LANTUS Inject 0.35 mLs (35 Units total) into the skin at bedtime.   insulin lispro 100 UNIT/ML injection Commonly known as: HUMALOG Inject 10-16 Units into the skin 3 (three) times daily before meals. Below 200 units = 10units 200-250=12 units 250-300=14units Over 300=16units   metoprolol tartrate 50 MG tablet Commonly known as: LOPRESSOR Take 50 mg by mouth 2 (two) times daily.   Omeprazole-Sodium Bicarbonate 20-1100 MG Caps capsule Commonly known as: ZEGERID Take 1 capsule by mouth daily before breakfast.   rosuvastatin 10 MG  tablet Commonly known as: CRESTOR Take 10 mg by mouth daily.   senna 8.6 MG Tabs tablet Commonly known as: SENOKOT Take 1 tablet by mouth daily.   Vitamin D 125 MCG (5000 UT) Caps Take 1 tablet by mouth daily.   zinc gluconate 50 MG tablet Take 50 mg by mouth daily.      Allergies  Allergen Reactions  . Glyxambi [Empagliflozin-Linagliptin] Other (See Comments)    Lethargy, slurring speaking.   . Heparin     Other reaction(s): Heparin-Induced Thrombocytopenia      Procedures/Studies: Vas [place] Lower Extremity Venous (dvt)  Result Date: 05/07/2019  Lower Venous Study Indications: Edema.  Comparison Study: 04/30/17 PREVIOUS STUDY Performing Technologist: Blanch MediaMegan Riddle RVS  Examination Guidelines: A complete evaluation includes B-mode imaging, spectral Doppler, color Doppler, and power Doppler as needed of all accessible portions of each vessel. Bilateral testing is considered an integral part of a complete examination. Limited examinations for reoccurring indications may be performed as noted.  +-----+---------------+---------+-----------+----------+--------------+ RIGHTCompressibilityPhasicitySpontaneityPropertiesThrombus Aging +-----+---------------+---------+-----------+----------+--------------+ CFV  Full           Yes  Yes                                 +-----+---------------+---------+-----------+----------+--------------+   +---------+---------------+---------+-----------+----------+--------------+ LEFT     CompressibilityPhasicitySpontaneityPropertiesThrombus Aging +---------+---------------+---------+-----------+----------+--------------+ CFV      Full           Yes      Yes                                 +---------+---------------+---------+-----------+----------+--------------+ SFJ      Full                                                        +---------+---------------+---------+-----------+----------+--------------+ FV Prox  Full                                                         +---------+---------------+---------+-----------+----------+--------------+ FV Mid   Full                                                        +---------+---------------+---------+-----------+----------+--------------+ FV DistalFull                                                        +---------+---------------+---------+-----------+----------+--------------+ PFV      Full                                                        +---------+---------------+---------+-----------+----------+--------------+ POP      Full           Yes      Yes                                 +---------+---------------+---------+-----------+----------+--------------+ PTV      Full                                                        +---------+---------------+---------+-----------+----------+--------------+ PERO     Full                                                        +---------+---------------+---------+-----------+----------+--------------+     Summary: Right: No evidence of common  femoral vein obstruction. Left: There is no evidence of deep vein thrombosis in the lower extremity. No cystic structure found in the popliteal fossa.  *See table(s) above for measurements and observations.    Preliminary       Subjective: Patient was interviewed and examined along with his RN in room.  Patient denies complaints.  Denies pain, fever, chills and his legs.  Indicates that he lives with his spouse, ambulates with the help of a walker.  As per RN, no acute issues noted.  Discharge Exam:  Vitals:   05/06/19 2114 05/06/19 2353 05/07/19 0454 05/07/19 1342  BP: (!) 177/74 (!) 168/69 (!) 162/84 (!) 159/77  Pulse: 62 (!) 58 60 (!) 56  Resp: Temp: 98.1 F (36.7 C)  98.4 F (36.9 C) 97.6 F (36.4 C)  TempSrc: Oral  Oral Oral  SpO2: 97%  95% 94%  Weight:      Height:        General: Pleasant elderly male,  moderately built and nourished lying comfortably propped up in bed without distress. Cardiovascular: S1 & S2 heard, RRR, S1/S2 +. No murmurs, rubs, gallops or clicks. No JVD. Respiratory: Clear to auscultation without wheezing, rhonchi or crackles. No increased work of breathing. Abdominal:  Non distended, non tender & soft. No organomegaly or masses appreciated. Normal bowel sounds heard. CNS: Alert and oriented. No focal deficits. Extremities: Symmetric 5 x 5 power.  1+ pitting bilateral leg edema, left > right.  Symmetric normal warmth.  No tenderness.  Mild area of hyperpigmentation on medial aspect of left lower leg which also shows 3 small areas of dry ruptured blisters without drainage or surrounding erythema.  Wound bed appears clean and pink.     The results of significant diagnostics from this hospitalization (including imaging, microbiology, ancillary and laboratory) are listed below for reference.     Microbiology: Recent Results (from the past 240 hour(s))  SARS CORONAVIRUS 2 (TAT 6-24 HRS) Nasopharyngeal Nasopharyngeal Swab     Status: None   Collection Time: 05/06/19  2:35 PM   Specimen: Nasopharyngeal Swab  Result Value Ref Range Status   SARS Coronavirus 2 NEGATIVE NEGATIVE Final    Comment: (NOTE) SARS-CoV-2 target nucleic acids are NOT DETECTED. The SARS-CoV-2 RNA is generally detectable in upper and lower respiratory specimens during the acute phase of infection. Negative results do not preclude SARS-CoV-2 infection, do not rule out co-infections with other pathogens, and should not be used as the sole basis for treatment or other patient management decisions. Negative results must be combined with clinical observations, patient history, and epidemiological information. The expected result is Negative. Fact Sheet for Patients: HairSlick.no Fact Sheet for Healthcare Providers: quierodirigir.com This test is not  yet approved or cleared by the [place] FDA and  has been authorized for detection and/or diagnosis of SARS-CoV-2 by FDA under an Emergency Use Authorization (EUA). This EUA will remain  in effect (meaning this test can be used) for the duration of the COVID-19 declaration under Section 56 4(b)(1) of the Act, 21 U.S.C. section 360bbb-3(b)(1), unless the authorization is terminated or revoked sooner. Performed at [facility], [address], [place], [place] [address]   MRSA PCR Screening     Status: Abnormal   Collection Time: 05/06/19  5:09 PM   Specimen: Nasopharyngeal  Result Value Ref Range Status   MRSA by PCR POSITIVE (A) NEGATIVE Final    Comment:        The  GeneXpert MRSA Assay (FDA approved for NASAL specimens only), is one component of a comprehensive MRSA colonization surveillance program. It is not intended to diagnose MRSA infection nor to guide or monitor treatment for MRSA infections. RESULT CALLED TO, READ BACK BY AND VERIFIED WITH: LEMONS,A RN @2235  ON 05/06/2019 JACKSON,K Performed at [facility], [address], [place], [place]   Culture, blood (routine x 2)     Status: None (Preliminary result)   Collection Time: 05/06/19  5:10 PM   Specimen: BLOOD  Result Value Ref Range Status   Specimen Description   Final    BLOOD RIGHT WRIST Performed at [facility], [address], [place], [place]    Special Requests   Final    BOTTLES DRAWN AEROBIC AND ANAEROBIC Blood Culture adequate volume Performed at [facility], [address] [place], [place]    Culture   Final    NO GROWTH < 12 HOURS Performed at [facility], [address], [place], [place]    Report Status PENDING  Incomplete  Culture, blood (routine x 2)     Status: None (Preliminary result)   Collection Time: 05/06/19  6:53 PM   Specimen: BLOOD  Result Value Ref Range Status    Specimen Description   Final    BLOOD LEFT ANTECUBITAL Performed at [facility], [address] [place], [place]    Special Requests   Final    BOTTLES DRAWN AEROBIC ONLY Blood Culture adequate volume Performed at [facility], [address] [place], [place]    Culture   Final    NO GROWTH < 12 HOURS Performed at [facility], [address] [place], [place]    Report Status PENDING  Incomplete     Labs: CBC: Recent Labs  Lab 05/06/19 1400 05/06/19 1617 05/07/19 0304  WBC 10.7*  --  9.4  NEUTROABS 6.9  --   --   HGB 16.1 15.6 14.8  HCT 48.4 46.0 46.1  MCV 88.5  --  89.3  PLT 217  --  210   Basic Metabolic Panel: Recent Labs  Lab 05/06/19 1400 05/06/19 1617 05/07/19 0304  NA 139 142 140  K 6.0* 3.9 4.0  CL 109 107 110  CO2 21*  --  23  GLUCOSE 79 89 149*  BUN 22 20 21   CREATININE 2.07* 1.90* 1.80*  CALCIUM 9.3  --  9.3   Liver Function Tests: Recent Labs  Lab 05/06/19 1400 05/07/19 0304  AST 41 20  ALT 27 20  ALKPHOS 67 57  BILITOT 1.0 0.7  PROT 7.1 6.2*  ALBUMIN 4.0 3.3*   CBG: Recent Labs  Lab 05/06/19 2213 05/07/19 0740 05/07/19 1134  GLUCAP 139* 128* 136*   Hgb A1c Recent Labs    05/06/19 1853  HGBA1C 5.5    I discussed in detail with patient's spouse, updated care and answered questions.   Time coordinating discharge: 35 minutes  SIGNED:  05/09/19, MD, FACP, [facility]. [facility]  To contact the attending provider between 7A-7P or the covering provider during after hours 7P-7A, please log into the web site [email or web address] and access using universal [place] password for that web site. If you do not have the password, please call the hospital operator.

## 2019-05-07 NOTE — Plan of Care (Signed)
All goals met for d/c.

## 2019-05-07 NOTE — Consult Note (Addendum)
[place] Nurse wound consult note Reason for Consult: LE wound Dr. Jake Bathe at the bedside Wound type: 3 small areas, dry, ruptured blisters  Wound bed: clean and pink Drainage (amount, consistency, odor) none Periwound:intac, no significant evidence of cellulitis; no hemosiderin staining.  Dressing procedure/placement/frequency: Silicone foam dressing Level 2 20-31mmHG compression knee high stockings. Measured with beside nurse and added orders and measurements into the nursing flow sheet Security to bring over from [facility]. Plans for DC today  Discussed POC with patient and bedside nurse.  Re consult if needed, will not follow at this time. Thanks  Francis Yardley [employer], RN,CWOCN, CNS, [place] [phone])

## 2019-05-11 LAB — CULTURE, BLOOD (ROUTINE X 2)
Culture: NO GROWTH
Culture: NO GROWTH
Special Requests: ADEQUATE
Special Requests: ADEQUATE

## 2019-05-25 DIAGNOSIS — I10 Essential (primary) hypertension: Secondary | ICD-10-CM | POA: Diagnosis not present

## 2019-05-25 DIAGNOSIS — E119 Type 2 diabetes mellitus without complications: Secondary | ICD-10-CM | POA: Diagnosis not present

## 2019-05-25 DIAGNOSIS — E7889 Other lipoprotein metabolism disorders: Secondary | ICD-10-CM | POA: Diagnosis not present

## 2019-05-31 DIAGNOSIS — E119 Type 2 diabetes mellitus without complications: Secondary | ICD-10-CM | POA: Diagnosis not present

## 2019-05-31 DIAGNOSIS — E1122 Type 2 diabetes mellitus with diabetic chronic kidney disease: Secondary | ICD-10-CM | POA: Diagnosis not present

## 2019-05-31 DIAGNOSIS — E782 Mixed hyperlipidemia: Secondary | ICD-10-CM | POA: Diagnosis not present

## 2019-05-31 DIAGNOSIS — I2510 Atherosclerotic heart disease of native coronary artery without angina pectoris: Secondary | ICD-10-CM | POA: Diagnosis not present

## 2019-05-31 DIAGNOSIS — E039 Hypothyroidism, unspecified: Secondary | ICD-10-CM | POA: Diagnosis not present

## 2019-06-11 DIAGNOSIS — Z794 Long term (current) use of insulin: Secondary | ICD-10-CM | POA: Diagnosis not present

## 2019-06-11 DIAGNOSIS — E11319 Type 2 diabetes mellitus with unspecified diabetic retinopathy without macular edema: Secondary | ICD-10-CM | POA: Diagnosis not present

## 2019-08-31 DIAGNOSIS — E119 Type 2 diabetes mellitus without complications: Secondary | ICD-10-CM | POA: Diagnosis not present

## 2019-08-31 DIAGNOSIS — I10 Essential (primary) hypertension: Secondary | ICD-10-CM | POA: Diagnosis not present

## 2019-09-03 DIAGNOSIS — Z794 Long term (current) use of insulin: Secondary | ICD-10-CM | POA: Diagnosis not present

## 2019-09-04 DIAGNOSIS — Z0000 Encounter for general adult medical examination without abnormal findings: Secondary | ICD-10-CM | POA: Diagnosis not present

## 2019-09-04 DIAGNOSIS — H6123 Impacted cerumen, bilateral: Secondary | ICD-10-CM | POA: Diagnosis not present

## 2019-09-04 DIAGNOSIS — I10 Essential (primary) hypertension: Secondary | ICD-10-CM | POA: Diagnosis not present

## 2019-09-13 DIAGNOSIS — Z794 Long term (current) use of insulin: Secondary | ICD-10-CM | POA: Diagnosis not present

## 2019-09-13 DIAGNOSIS — E11319 Type 2 diabetes mellitus with unspecified diabetic retinopathy without macular edema: Secondary | ICD-10-CM | POA: Diagnosis not present

## 2019-09-21 DIAGNOSIS — E119 Type 2 diabetes mellitus without complications: Secondary | ICD-10-CM | POA: Diagnosis not present

## 2019-10-01 DIAGNOSIS — I10 Essential (primary) hypertension: Secondary | ICD-10-CM | POA: Diagnosis not present

## 2019-10-04 DIAGNOSIS — I2510 Atherosclerotic heart disease of native coronary artery without angina pectoris: Secondary | ICD-10-CM | POA: Diagnosis not present

## 2019-10-04 DIAGNOSIS — E039 Hypothyroidism, unspecified: Secondary | ICD-10-CM | POA: Diagnosis not present

## 2019-10-04 DIAGNOSIS — E119 Type 2 diabetes mellitus without complications: Secondary | ICD-10-CM | POA: Diagnosis not present

## 2019-10-04 DIAGNOSIS — E782 Mixed hyperlipidemia: Secondary | ICD-10-CM | POA: Diagnosis not present

## 2019-10-04 DIAGNOSIS — E1122 Type 2 diabetes mellitus with diabetic chronic kidney disease: Secondary | ICD-10-CM | POA: Diagnosis not present

## 2019-12-12 DIAGNOSIS — Z794 Long term (current) use of insulin: Secondary | ICD-10-CM | POA: Diagnosis not present

## 2019-12-12 DIAGNOSIS — E11319 Type 2 diabetes mellitus with unspecified diabetic retinopathy without macular edema: Secondary | ICD-10-CM | POA: Diagnosis not present

## 2020-02-04 DIAGNOSIS — Z79899 Other long term (current) drug therapy: Secondary | ICD-10-CM | POA: Diagnosis not present

## 2020-02-04 DIAGNOSIS — R269 Unspecified abnormalities of gait and mobility: Secondary | ICD-10-CM | POA: Diagnosis not present

## 2020-02-04 DIAGNOSIS — E1122 Type 2 diabetes mellitus with diabetic chronic kidney disease: Secondary | ICD-10-CM | POA: Diagnosis not present

## 2020-02-11 DIAGNOSIS — E1122 Type 2 diabetes mellitus with diabetic chronic kidney disease: Secondary | ICD-10-CM | POA: Diagnosis not present

## 2020-02-11 DIAGNOSIS — E119 Type 2 diabetes mellitus without complications: Secondary | ICD-10-CM | POA: Diagnosis not present

## 2020-02-11 DIAGNOSIS — E039 Hypothyroidism, unspecified: Secondary | ICD-10-CM | POA: Diagnosis not present

## 2020-02-11 DIAGNOSIS — E782 Mixed hyperlipidemia: Secondary | ICD-10-CM | POA: Diagnosis not present

## 2020-02-11 DIAGNOSIS — I2510 Atherosclerotic heart disease of native coronary artery without angina pectoris: Secondary | ICD-10-CM | POA: Diagnosis not present

## 2020-03-11 DIAGNOSIS — Z794 Long term (current) use of insulin: Secondary | ICD-10-CM | POA: Diagnosis not present

## 2020-03-11 DIAGNOSIS — E11319 Type 2 diabetes mellitus with unspecified diabetic retinopathy without macular edema: Secondary | ICD-10-CM | POA: Diagnosis not present

## 2020-06-09 DIAGNOSIS — Z794 Long term (current) use of insulin: Secondary | ICD-10-CM | POA: Diagnosis not present

## 2020-06-09 DIAGNOSIS — E11319 Type 2 diabetes mellitus with unspecified diabetic retinopathy without macular edema: Secondary | ICD-10-CM | POA: Diagnosis not present

## 2020-06-10 ENCOUNTER — Encounter: Payer: Self-pay | Admitting: Podiatry

## 2020-06-10 ENCOUNTER — Ambulatory Visit: Payer: Medicare Other | Admitting: Podiatry

## 2020-06-10 ENCOUNTER — Other Ambulatory Visit: Payer: Self-pay

## 2020-06-10 ENCOUNTER — Ambulatory Visit: Payer: Medicare Other

## 2020-06-10 DIAGNOSIS — L97511 Non-pressure chronic ulcer of other part of right foot limited to breakdown of skin: Secondary | ICD-10-CM

## 2020-06-10 MED ORDER — MUPIROCIN 2 % EX OINT: 1.0000 "application " | TOPICAL_OINTMENT | Freq: Two times a day (BID) | CUTANEOUS | 0 refills | Status: DC

## 2020-06-10 NOTE — Progress Notes (Signed)
Subjective:  Patient ID: Johnny [name], male    DOB: 1932-08-13,  MRN: [id number] HPI Chief Complaint  Patient presents with  . Foot Pain    Plantar/lateral left - wound x few weeks, concerned now that it has darkened around it  . Foot Pain    Posterior heel right - darkened area x few weeks  . New Patient (Initial Visit)    86 y.o. male presents with the above complaint.   ROS: Denies fever chills nausea vomiting muscle aches pains calf pain back pain chest pain shortness of breath.  Past Medical History:  Diagnosis Date  . Aortic stenosis   . BPH (benign prostatic hypertrophy)   . Branch retinal vein occlusion 08/01/2013  . CAD (coronary artery disease)   . CVA (cerebral infarction)   . Diabetes mellitus (HCC)   . Diabetic retinopathy (HCC)   . Gait disturbance   . GERD (gastroesophageal reflux disease)   . Gout   . Heart murmur   . HTN (hypertension)   . Macular degeneration   . Macular degeneration   . Macular edema   . Paresthesia   . Pseudophakia of right eye   . Ptosis    Past Surgical History:  Procedure Laterality Date  . AORTIC VALVE REPLACEMENT  03/26/2013  . CATARACT EXTRACTION W/ INTRAOCULAR LENS IMPLANT  11/01/2013  . CATARACT EXTRACTION W/ INTRAOCULAR LENS IMPLANT Right 12/18/2009  . COLONOSCOPY W/ POLYPECTOMY    . CORONARY ARTERY BYPASS GRAFT  03/26/2013  . EYE SURGERY  03/19/2016   Ocular tube shunt insertion  . PARS PLANA VITRECTOMY  03/19/2016  . PARS PLANA VITRECTOMY  11/08/2013  . PARS PLANA VITRECTOMY  08/02/2012  . PROSTATE SURGERY    . TEE WITHOUT CARDIOVERSION N/A 05/02/2017   Procedure: TRANSESOPHAGEAL ECHOCARDIOGRAM (TEE);  Surgeon: Lars Masson, MD;  Location: [facility] ENDOSCOPY;  Service: Cardiovascular;  Laterality: N/A;    Current Outpatient Medications:  .  allopurinol (ZYLOPRIM) 100 MG tablet, Take 200 mg by mouth at bedtime. , Disp: , Rfl:  .  amLODipine (NORVASC) 5 MG tablet, Take 5 mg by mouth daily., Disp: , Rfl:  .   aspirin 81 MG chewable tablet, Chew 81 mg by mouth daily., Disp: , Rfl:  .  Cholecalciferol (VITAMIN D) 125 MCG (5000 UT) CAPS, Take 1 tablet by mouth daily., Disp: , Rfl:  .  cloNIDine (CATAPRES) 0.1 MG tablet, Take 0.1 mg by mouth 2 (two) times daily., Disp: , Rfl:  .  docusate sodium (COLACE) 100 MG capsule, Take 100 mg by mouth 2 (two) times daily., Disp: , Rfl:  .  ferrous sulfate 325 (65 FE) MG tablet, Take 325 mg by mouth 2 (two) times daily., Disp: , Rfl: 0 .  furosemide (LASIX) 40 MG tablet, Take 40 mg by mouth daily. , Disp: , Rfl:  .  hydrALAZINE (APRESOLINE) 50 MG tablet, Take 50 mg by mouth 3 (three) times daily. , Disp: , Rfl:  .  insulin glargine (LANTUS) 100 UNIT/ML injection, Inject 0.35 mLs (35 Units total) into the skin at bedtime., Disp: , Rfl:  .  insulin lispro (HUMALOG) 100 UNIT/ML injection, Inject 10-16 Units into the skin 3 (three) times daily before meals. Below 200 units = 10units 200-250=12 units 250-300=14units Over 300=16units, Disp: , Rfl:  .  levothyroxine (SYNTHROID) 50 MCG tablet, Take 50 mcg by mouth daily., Disp: , Rfl:  .  metoprolol (LOPRESSOR) 50 MG tablet, Take 50 mg by mouth 2 (two) times daily., Disp: , Rfl:  .  mupirocin ointment (BACTROBAN) 2 %, Apply 1 application topically 2 (two) times daily., Disp: 22 g, Rfl: 0 .  Omeprazole-Sodium Bicarbonate (ZEGERID) 20-1100 MG CAPS capsule, Take 1 capsule by mouth daily before breakfast., Disp: , Rfl:  .  rosuvastatin (CRESTOR) 10 MG tablet, Take 10 mg by mouth daily., Disp: , Rfl:  .  senna (SENOKOT) 8.6 MG TABS tablet, Take 1 tablet by mouth daily., Disp: , Rfl:  .  zinc gluconate 50 MG tablet, Take 50 mg by mouth daily., Disp: , Rfl:   Allergies  Allergen Reactions  . Glyxambi [Empagliflozin-Linagliptin] Other (See Comments)    Lethargy, slurring speaking.   . Heparin     Other reaction(s): Heparin-Induced Thrombocytopenia   Review of Systems Objective:  There were no vitals filed for this  visit.  General: Well developed, nourished, in no acute distress, alert and oriented x3   Dermatological: Skin is warm, dry and supple bilateral. Nails x 10 are well maintained; remaining integument appears unremarkable at this time. There are no open sores, no preulcerative lesions, no rash or signs of infection present.  Skin abrasion to the plantar lateral aspect of the left foot most likely associated with aggressive debridement.  Vascular: Dorsalis Pedis artery and Posterior Tibial artery pedal pulses are 2/4 right with immedate capillary fill time.  Left foot demonstrates edema and a decreased palpable pulse most likely secondary to the edema.  Pedal hair growth present. No varicosities and no lower extremity edema present bilateral.   Neruologic: Grossly intact via light touch bilateral. Vibratory intact via tuning fork bilateral. Protective threshold with Semmes Wienstein monofilament intact to all pedal sites bilateral. Patellar and Achilles deep tendon reflexes 2+ bilateral. No Babinski or clonus noted bilateral.   Musculoskeletal: No gross boney pedal deformities bilateral. No pain, crepitus, or limitation noted with foot and ankle range of motion bilateral. Muscular strength 5/5 in all groups tested bilateral.  Gait: Unassisted, Nonantalgic.    Radiographs:  None taken  Assessment & Plan:   Assessment: Skin abrasion left edema left decrease in palpable pulses left  Plan: Start him on Bactroban ointment apply once a day with a light dressing follow-up with him in 2 to 3 weeks call sooner with questions or concerns     Herberth Deharo T. [place], [place]

## 2020-06-12 DIAGNOSIS — Z79899 Other long term (current) drug therapy: Secondary | ICD-10-CM | POA: Diagnosis not present

## 2020-06-12 DIAGNOSIS — R269 Unspecified abnormalities of gait and mobility: Secondary | ICD-10-CM | POA: Diagnosis not present

## 2020-06-12 DIAGNOSIS — E1122 Type 2 diabetes mellitus with diabetic chronic kidney disease: Secondary | ICD-10-CM | POA: Diagnosis not present

## 2020-06-18 DIAGNOSIS — E1122 Type 2 diabetes mellitus with diabetic chronic kidney disease: Secondary | ICD-10-CM | POA: Diagnosis not present

## 2020-06-18 DIAGNOSIS — E119 Type 2 diabetes mellitus without complications: Secondary | ICD-10-CM | POA: Diagnosis not present

## 2020-06-18 DIAGNOSIS — E782 Mixed hyperlipidemia: Secondary | ICD-10-CM | POA: Diagnosis not present

## 2020-06-18 DIAGNOSIS — E039 Hypothyroidism, unspecified: Secondary | ICD-10-CM | POA: Diagnosis not present

## 2020-07-01 ENCOUNTER — Other Ambulatory Visit: Payer: Self-pay

## 2020-07-01 ENCOUNTER — Ambulatory Visit: Payer: Medicare Other | Admitting: Podiatry

## 2020-07-01 ENCOUNTER — Encounter: Payer: Self-pay | Admitting: Podiatry

## 2020-07-01 DIAGNOSIS — L97511 Non-pressure chronic ulcer of other part of right foot limited to breakdown of skin: Secondary | ICD-10-CM

## 2020-07-01 MED ORDER — MUPIROCIN 2 % EX OINT: 1.0000 "application " | TOPICAL_OINTMENT | Freq: Two times a day (BID) | CUTANEOUS | 0 refills | Status: DC

## 2020-07-01 NOTE — Progress Notes (Signed)
He presents today with his wife for follow-up of a superficial ulceration to the distal aspect of the hallux which is healing as well as a an abrasion to the lateral aspect of the left foot.  He denies fever chills nausea vomiting muscle aches pains and states that seems to be getting better.  Objective: Vital signs are stable alert oriented x3 no open lesions or wounds are noted today.  Assessment: Well-healing ulcerative lesions bilaterally.  Plan: Follow-up with me on an as-needed basis dispensed a gel toe cap for the right hallux and redressed the healed abrasion on the lateral foot at her request.

## 2020-09-09 DIAGNOSIS — E11319 Type 2 diabetes mellitus with unspecified diabetic retinopathy without macular edema: Secondary | ICD-10-CM | POA: Diagnosis not present

## 2020-09-09 DIAGNOSIS — Z794 Long term (current) use of insulin: Secondary | ICD-10-CM | POA: Diagnosis not present

## 2020-12-02 ENCOUNTER — Encounter: Payer: Self-pay | Admitting: Podiatry

## 2020-12-02 ENCOUNTER — Ambulatory Visit: Payer: Medicare Other | Admitting: Podiatry

## 2020-12-02 ENCOUNTER — Other Ambulatory Visit: Payer: Self-pay

## 2020-12-02 DIAGNOSIS — L97511 Non-pressure chronic ulcer of other part of right foot limited to breakdown of skin: Secondary | ICD-10-CM | POA: Diagnosis not present

## 2020-12-02 NOTE — Progress Notes (Signed)
He presents today with his wife concerned about the discoloration of the foot and the abrasions to the toes of the right hallux.  She was concerned about the swelling of the whole foot was really dark yesterday.  Objective: Vital signs are stable alert oriented x3 pulses are palpable.  He has fluid retention and postinflammatory hyperpigmentation to the right foot.  He does have some abrasions to the dorsal aspect of his toe but does not appear to be cellulitic.  There is no purulence no malodor.  Otherwise toenails appear to be healthy and in good shape.  Assessment: Mild abrasion hallux right foot.  Edema to the right foot.  Postinflammatory hyperpigmentation.  Plan: At this point I encouraged his wife to continue dressing daily with mupirocin cream and I will follow-up with him on an as-needed basis.

## 2020-12-10 DIAGNOSIS — Z794 Long term (current) use of insulin: Secondary | ICD-10-CM | POA: Diagnosis not present

## 2020-12-10 DIAGNOSIS — E11319 Type 2 diabetes mellitus with unspecified diabetic retinopathy without macular edema: Secondary | ICD-10-CM | POA: Diagnosis not present

## 2021-03-04 ENCOUNTER — Emergency Department (HOSPITAL_COMMUNITY): Payer: Medicare Other

## 2021-03-04 ENCOUNTER — Inpatient Hospital Stay (HOSPITAL_COMMUNITY)
Admission: EM | Admit: 2021-03-04 | Discharge: 2021-03-11 | DRG: 617 | Disposition: A | Discharge status: Skilled Nursing Facility | Payer: Medicare Other | Attending: Family Medicine | Admitting: Family Medicine

## 2021-03-04 ENCOUNTER — Encounter (HOSPITAL_COMMUNITY): Payer: Self-pay | Admitting: *Deleted

## 2021-03-04 ENCOUNTER — Other Ambulatory Visit: Payer: Self-pay

## 2021-03-04 DIAGNOSIS — R6889 Other general symptoms and signs: Secondary | ICD-10-CM | POA: Diagnosis not present

## 2021-03-04 DIAGNOSIS — I6782 Cerebral ischemia: Secondary | ICD-10-CM | POA: Diagnosis not present

## 2021-03-04 DIAGNOSIS — I6523 Occlusion and stenosis of bilateral carotid arteries: Secondary | ICD-10-CM | POA: Diagnosis not present

## 2021-03-04 DIAGNOSIS — R1312 Dysphagia, oropharyngeal phase: Secondary | ICD-10-CM | POA: Diagnosis not present

## 2021-03-04 DIAGNOSIS — S98119A Complete traumatic amputation of unspecified great toe, initial encounter: Secondary | ICD-10-CM | POA: Diagnosis not present

## 2021-03-04 DIAGNOSIS — N1832 Chronic kidney disease, stage 3b: Secondary | ICD-10-CM | POA: Diagnosis present

## 2021-03-04 DIAGNOSIS — M86671 Other chronic osteomyelitis, right ankle and foot: Secondary | ICD-10-CM | POA: Diagnosis not present

## 2021-03-04 DIAGNOSIS — Z79899 Other long term (current) drug therapy: Secondary | ICD-10-CM | POA: Diagnosis not present

## 2021-03-04 DIAGNOSIS — H3530 Unspecified macular degeneration: Secondary | ICD-10-CM | POA: Diagnosis not present

## 2021-03-04 DIAGNOSIS — L97516 Non-pressure chronic ulcer of other part of right foot with bone involvement without evidence of necrosis: Secondary | ICD-10-CM | POA: Diagnosis not present

## 2021-03-04 DIAGNOSIS — E782 Mixed hyperlipidemia: Secondary | ICD-10-CM | POA: Diagnosis present

## 2021-03-04 DIAGNOSIS — Z803 Family history of malignant neoplasm of breast: Secondary | ICD-10-CM

## 2021-03-04 DIAGNOSIS — L97519 Non-pressure chronic ulcer of other part of right foot with unspecified severity: Secondary | ICD-10-CM | POA: Diagnosis not present

## 2021-03-04 DIAGNOSIS — S91101A Unspecified open wound of right great toe without damage to nail, initial encounter: Secondary | ICD-10-CM | POA: Diagnosis not present

## 2021-03-04 DIAGNOSIS — M109 Gout, unspecified: Secondary | ICD-10-CM | POA: Diagnosis present

## 2021-03-04 DIAGNOSIS — Z8739 Personal history of other diseases of the musculoskeletal system and connective tissue: Secondary | ICD-10-CM | POA: Diagnosis present

## 2021-03-04 DIAGNOSIS — E11319 Type 2 diabetes mellitus with unspecified diabetic retinopathy without macular edema: Secondary | ICD-10-CM | POA: Diagnosis not present

## 2021-03-04 DIAGNOSIS — Z794 Long term (current) use of insulin: Secondary | ICD-10-CM | POA: Diagnosis not present

## 2021-03-04 DIAGNOSIS — I672 Cerebral atherosclerosis: Secondary | ICD-10-CM | POA: Diagnosis not present

## 2021-03-04 DIAGNOSIS — M7989 Other specified soft tissue disorders: Secondary | ICD-10-CM | POA: Diagnosis not present

## 2021-03-04 DIAGNOSIS — I4430 Unspecified atrioventricular block: Secondary | ICD-10-CM | POA: Diagnosis not present

## 2021-03-04 DIAGNOSIS — R531 Weakness: Secondary | ICD-10-CM | POA: Diagnosis not present

## 2021-03-04 DIAGNOSIS — M810 Age-related osteoporosis without current pathological fracture: Secondary | ICD-10-CM | POA: Diagnosis not present

## 2021-03-04 DIAGNOSIS — R0902 Hypoxemia: Secondary | ICD-10-CM | POA: Diagnosis not present

## 2021-03-04 DIAGNOSIS — H547 Unspecified visual loss: Secondary | ICD-10-CM | POA: Diagnosis not present

## 2021-03-04 DIAGNOSIS — Z952 Presence of prosthetic heart valve: Secondary | ICD-10-CM

## 2021-03-04 DIAGNOSIS — E11311 Type 2 diabetes mellitus with unspecified diabetic retinopathy with macular edema: Secondary | ICD-10-CM | POA: Diagnosis not present

## 2021-03-04 DIAGNOSIS — N400 Enlarged prostate without lower urinary tract symptoms: Secondary | ICD-10-CM | POA: Diagnosis present

## 2021-03-04 DIAGNOSIS — E1122 Type 2 diabetes mellitus with diabetic chronic kidney disease: Secondary | ICD-10-CM | POA: Diagnosis not present

## 2021-03-04 DIAGNOSIS — E1169 Type 2 diabetes mellitus with other specified complication: Secondary | ICD-10-CM | POA: Diagnosis not present

## 2021-03-04 DIAGNOSIS — I129 Hypertensive chronic kidney disease with stage 1 through stage 4 chronic kidney disease, or unspecified chronic kidney disease: Secondary | ICD-10-CM | POA: Diagnosis not present

## 2021-03-04 DIAGNOSIS — E785 Hyperlipidemia, unspecified: Secondary | ICD-10-CM | POA: Diagnosis not present

## 2021-03-04 DIAGNOSIS — E11621 Type 2 diabetes mellitus with foot ulcer: Secondary | ICD-10-CM | POA: Diagnosis present

## 2021-03-04 DIAGNOSIS — I440 Atrioventricular block, first degree: Secondary | ICD-10-CM | POA: Diagnosis present

## 2021-03-04 DIAGNOSIS — E860 Dehydration: Secondary | ICD-10-CM | POA: Diagnosis present

## 2021-03-04 DIAGNOSIS — R262 Difficulty in walking, not elsewhere classified: Secondary | ICD-10-CM | POA: Diagnosis not present

## 2021-03-04 DIAGNOSIS — K219 Gastro-esophageal reflux disease without esophagitis: Secondary | ICD-10-CM | POA: Diagnosis not present

## 2021-03-04 DIAGNOSIS — I10 Essential (primary) hypertension: Secondary | ICD-10-CM

## 2021-03-04 DIAGNOSIS — I2510 Atherosclerotic heart disease of native coronary artery without angina pectoris: Secondary | ICD-10-CM | POA: Diagnosis present

## 2021-03-04 DIAGNOSIS — M869 Osteomyelitis, unspecified: Secondary | ICD-10-CM | POA: Diagnosis present

## 2021-03-04 DIAGNOSIS — J069 Acute upper respiratory infection, unspecified: Secondary | ICD-10-CM | POA: Diagnosis not present

## 2021-03-04 DIAGNOSIS — Z7982 Long term (current) use of aspirin: Secondary | ICD-10-CM

## 2021-03-04 DIAGNOSIS — E1165 Type 2 diabetes mellitus with hyperglycemia: Secondary | ICD-10-CM | POA: Diagnosis not present

## 2021-03-04 DIAGNOSIS — I499 Cardiac arrhythmia, unspecified: Secondary | ICD-10-CM | POA: Diagnosis not present

## 2021-03-04 DIAGNOSIS — G319 Degenerative disease of nervous system, unspecified: Secondary | ICD-10-CM | POA: Diagnosis not present

## 2021-03-04 DIAGNOSIS — Z20822 Contact with and (suspected) exposure to covid-19: Secondary | ICD-10-CM | POA: Diagnosis not present

## 2021-03-04 DIAGNOSIS — M6281 Muscle weakness (generalized): Secondary | ICD-10-CM | POA: Diagnosis not present

## 2021-03-04 DIAGNOSIS — N183 Chronic kidney disease, stage 3 unspecified: Secondary | ICD-10-CM | POA: Diagnosis not present

## 2021-03-04 DIAGNOSIS — R001 Bradycardia, unspecified: Secondary | ICD-10-CM | POA: Diagnosis not present

## 2021-03-04 DIAGNOSIS — Z951 Presence of aortocoronary bypass graft: Secondary | ICD-10-CM

## 2021-03-04 DIAGNOSIS — Z8673 Personal history of transient ischemic attack (TIA), and cerebral infarction without residual deficits: Secondary | ICD-10-CM

## 2021-03-04 DIAGNOSIS — Z7401 Bed confinement status: Secondary | ICD-10-CM | POA: Diagnosis not present

## 2021-03-04 DIAGNOSIS — S91301A Unspecified open wound, right foot, initial encounter: Secondary | ICD-10-CM | POA: Diagnosis not present

## 2021-03-04 DIAGNOSIS — N179 Acute kidney failure, unspecified: Secondary | ICD-10-CM | POA: Diagnosis present

## 2021-03-04 DIAGNOSIS — Z87891 Personal history of nicotine dependence: Secondary | ICD-10-CM

## 2021-03-04 DIAGNOSIS — L089 Local infection of the skin and subcutaneous tissue, unspecified: Secondary | ICD-10-CM | POA: Diagnosis not present

## 2021-03-04 DIAGNOSIS — Z89421 Acquired absence of other right toe(s): Secondary | ICD-10-CM | POA: Diagnosis not present

## 2021-03-04 DIAGNOSIS — S98911A Complete traumatic amputation of right foot, level unspecified, initial encounter: Secondary | ICD-10-CM | POA: Diagnosis not present

## 2021-03-04 DIAGNOSIS — E119 Type 2 diabetes mellitus without complications: Secondary | ICD-10-CM | POA: Diagnosis not present

## 2021-03-04 DIAGNOSIS — Z743 Need for continuous supervision: Secondary | ICD-10-CM | POA: Diagnosis not present

## 2021-03-04 DIAGNOSIS — Z9889 Other specified postprocedural states: Secondary | ICD-10-CM | POA: Diagnosis not present

## 2021-03-04 DIAGNOSIS — Z8669 Personal history of other diseases of the nervous system and sense organs: Secondary | ICD-10-CM | POA: Diagnosis not present

## 2021-03-04 DIAGNOSIS — Z888 Allergy status to other drugs, medicaments and biological substances status: Secondary | ICD-10-CM

## 2021-03-04 DIAGNOSIS — Z801 Family history of malignant neoplasm of trachea, bronchus and lung: Secondary | ICD-10-CM

## 2021-03-04 DIAGNOSIS — R2681 Unsteadiness on feet: Secondary | ICD-10-CM | POA: Diagnosis not present

## 2021-03-04 DIAGNOSIS — Z09 Encounter for follow-up examination after completed treatment for conditions other than malignant neoplasm: Secondary | ICD-10-CM

## 2021-03-04 LAB — URINALYSIS, ROUTINE W REFLEX MICROSCOPIC
Bilirubin Urine: NEGATIVE
Glucose, UA: NEGATIVE mg/dL
Hgb urine dipstick: NEGATIVE
Ketones, ur: NEGATIVE mg/dL
Leukocytes,Ua: NEGATIVE
Nitrite: NEGATIVE
Protein, ur: 100 mg/dL — AB
Specific Gravity, Urine: 1.008 (ref 1.005–1.030)
pH: 7 (ref 5.0–8.0)

## 2021-03-04 LAB — CBC
HCT: 47.4 % (ref 39.0–52.0)
Hemoglobin: 15.4 g/dL (ref 13.0–17.0)
MCH: 29 pg (ref 26.0–34.0)
MCHC: 32.5 g/dL (ref 30.0–36.0)
MCV: 89.3 fL (ref 80.0–100.0)
Platelets: 239 10*3/uL (ref 150–400)
RBC: 5.31 MIL/uL (ref 4.22–5.81)
RDW: 13.7 % (ref 11.5–15.5)
WBC: 10.4 10*3/uL (ref 4.0–10.5)
nRBC: 0 % (ref 0.0–0.2)

## 2021-03-04 LAB — RESP PANEL BY RT-PCR (FLU A&B, COVID) ARPGX2
Influenza A by PCR: NEGATIVE
Influenza B by PCR: NEGATIVE
SARS Coronavirus 2 by RT PCR: NEGATIVE

## 2021-03-04 LAB — BASIC METABOLIC PANEL
Anion gap: 9 (ref 5–15)
BUN: 26 mg/dL — ABNORMAL HIGH (ref 8–23)
CO2: 26 mmol/L (ref 22–32)
Calcium: 9.9 mg/dL (ref 8.9–10.3)
Chloride: 100 mmol/L (ref 98–111)
Creatinine, Ser: 2.41 mg/dL — ABNORMAL HIGH (ref 0.61–1.24)
GFR, Estimated: 26 mL/min — ABNORMAL LOW (ref >60)
Glucose, Bld: 183 mg/dL — ABNORMAL HIGH (ref 70–99)
Potassium: 4.2 mmol/L (ref 3.5–5.1)
Sodium: 135 mmol/L (ref 135–145)

## 2021-03-04 MED ORDER — INSULIN ASPART 100 UNIT/ML IJ SOLN
0.0000 [IU] | Freq: Three times a day (TID) | INTRAMUSCULAR | Status: DC
Start: 2021-03-05 — End: 2021-03-06
  Administered 2021-03-05: 3 [IU] via SUBCUTANEOUS

## 2021-03-04 MED ORDER — SENNA 8.6 MG PO TABS
1.0000 | ORAL_TABLET | Freq: Every day | ORAL | Status: DC
  Administered 2021-03-05 – 2021-03-11 (×6): 8.6 mg via ORAL
  Filled 2021-03-04 (×6): qty 1

## 2021-03-04 MED ORDER — ACETAMINOPHEN 325 MG PO TABS: 650.0000 mg | ORAL_TABLET | Freq: Four times a day (QID) | ORAL | Status: DC | PRN

## 2021-03-04 MED ORDER — ROSUVASTATIN CALCIUM 5 MG PO TABS
10.0000 mg | ORAL_TABLET | Freq: Every day | ORAL | Status: DC
  Administered 2021-03-05 – 2021-03-11 (×6): 10 mg via ORAL
  Filled 2021-03-04 (×6): qty 2

## 2021-03-04 MED ORDER — ONDANSETRON HCL 4 MG PO TABS: 4.0000 mg | ORAL_TABLET | Freq: Four times a day (QID) | ORAL | Status: DC | PRN

## 2021-03-04 MED ORDER — VANCOMYCIN HCL 1750 MG/350ML IV SOLN
1750.0000 mg | Freq: Once | INTRAVENOUS | Status: AC
  Administered 2021-03-04: 1750 mg via INTRAVENOUS
  Filled 2021-03-04: qty 350

## 2021-03-04 MED ORDER — ZINC SULFATE 220 (50 ZN) MG PO CAPS
220.0000 mg | ORAL_CAPSULE | Freq: Every day | ORAL | Status: DC
  Administered 2021-03-05 – 2021-03-11 (×6): 220 mg via ORAL
  Filled 2021-03-04 (×6): qty 1

## 2021-03-04 MED ORDER — HYDRALAZINE HCL 20 MG/ML IJ SOLN: 10.0000 mg | Freq: Four times a day (QID) | INTRAMUSCULAR | Status: DC | PRN

## 2021-03-04 MED ORDER — SODIUM CHLORIDE 0.9 % IV SOLN: INTRAVENOUS | Status: AC

## 2021-03-04 MED ORDER — INSULIN GLARGINE-YFGN 100 UNIT/ML ~~LOC~~ SOLN
24.0000 [IU] | Freq: Every day | SUBCUTANEOUS | Status: DC
  Administered 2021-03-05: 24 [IU] via SUBCUTANEOUS
  Filled 2021-03-04 (×2): qty 0.24

## 2021-03-04 MED ORDER — ALLOPURINOL 100 MG PO TABS
200.0000 mg | ORAL_TABLET | Freq: Every day | ORAL | Status: DC
  Administered 2021-03-05 – 2021-03-10 (×7): 200 mg via ORAL
  Filled 2021-03-04 (×8): qty 2

## 2021-03-04 MED ORDER — SODIUM CHLORIDE 0.9 % IV SOLN
2.0000 g | INTRAVENOUS | Status: DC
  Administered 2021-03-04 – 2021-03-06 (×3): 2 g via INTRAVENOUS
  Filled 2021-03-04 (×3): qty 20

## 2021-03-04 MED ORDER — PANTOPRAZOLE SODIUM 40 MG PO TBEC
40.0000 mg | DELAYED_RELEASE_TABLET | Freq: Every day | ORAL | Status: DC
  Administered 2021-03-05 – 2021-03-11 (×6): 40 mg via ORAL
  Filled 2021-03-04: qty 2
  Filled 2021-03-04 (×5): qty 1

## 2021-03-04 MED ORDER — SODIUM CHLORIDE 0.9 % IV BOLUS
1000.0000 mL | Freq: Once | INTRAVENOUS | Status: AC
  Administered 2021-03-04: 1000 mL via INTRAVENOUS

## 2021-03-04 MED ORDER — ASPIRIN 81 MG PO CHEW
81.0000 mg | CHEWABLE_TABLET | Freq: Every day | ORAL | Status: DC
  Administered 2021-03-05 – 2021-03-11 (×6): 81 mg via ORAL
  Filled 2021-03-04 (×6): qty 1

## 2021-03-04 MED ORDER — HYDRALAZINE HCL 25 MG PO TABS
50.0000 mg | ORAL_TABLET | Freq: Once | ORAL | Status: AC
  Administered 2021-03-04: 50 mg via ORAL
  Filled 2021-03-04: qty 2

## 2021-03-04 MED ORDER — ONDANSETRON HCL 4 MG/2ML IJ SOLN: 4.0000 mg | Freq: Four times a day (QID) | INTRAMUSCULAR | Status: DC | PRN

## 2021-03-04 MED ORDER — INSULIN ASPART 100 UNIT/ML IJ SOLN: 10.0000 [IU] | Freq: Three times a day (TID) | INTRAMUSCULAR | Status: DC

## 2021-03-04 MED ORDER — METOPROLOL TARTRATE 50 MG PO TABS
50.0000 mg | ORAL_TABLET | Freq: Two times a day (BID) | ORAL | Status: DC
  Administered 2021-03-05 – 2021-03-11 (×13): 50 mg via ORAL
  Filled 2021-03-04 (×6): qty 1
  Filled 2021-03-04: qty 2
  Filled 2021-03-04 (×7): qty 1

## 2021-03-04 MED ORDER — DOCUSATE SODIUM 100 MG PO CAPS
100.0000 mg | ORAL_CAPSULE | Freq: Two times a day (BID) | ORAL | Status: DC
  Administered 2021-03-05 – 2021-03-11 (×12): 100 mg via ORAL
  Filled 2021-03-04 (×12): qty 1

## 2021-03-04 MED ORDER — AMLODIPINE BESYLATE 5 MG PO TABS
5.0000 mg | ORAL_TABLET | Freq: Every day | ORAL | Status: DC
  Administered 2021-03-05 – 2021-03-06 (×2): 5 mg via ORAL
  Filled 2021-03-04 (×2): qty 1

## 2021-03-04 MED ORDER — POLYETHYLENE GLYCOL 3350 17 G PO PACK: 17.0000 g | PACK | Freq: Every day | ORAL | Status: DC | PRN

## 2021-03-04 MED ORDER — ACETAMINOPHEN 650 MG RE SUPP: 650.0000 mg | Freq: Four times a day (QID) | RECTAL | Status: DC | PRN

## 2021-03-04 MED ORDER — VANCOMYCIN VARIABLE DOSE PER UNSTABLE RENAL FUNCTION (PHARMACIST DOSING): Status: DC

## 2021-03-04 MED ORDER — HYDRALAZINE HCL 50 MG PO TABS
50.0000 mg | ORAL_TABLET | Freq: Three times a day (TID) | ORAL | Status: DC
  Administered 2021-03-05 – 2021-03-08 (×11): 50 mg via ORAL
  Filled 2021-03-04 (×5): qty 1
  Filled 2021-03-04: qty 2
  Filled 2021-03-04 (×5): qty 1

## 2021-03-04 NOTE — ED Provider Notes (Signed)
[facility] EMERGENCY DEPARTMENT Provider Note   CSN: [id number] Arrival date & time: 03/04/21  0700     History Chief Complaint  Patient presents with   Weakness    Johnny [name] is a 87 y.o. male with past medical history of aortic stenosis, CAD, CVA, diabetes, blind due to diabetic retinopathy and macular degeneration the presents emerged department today by EMS for generalized weakness.  Patient is a poor historian, tells me that his wife called EMS because he needed to be checked out.  States that wife was concerned about his diabetes and his toe.  Patient states that he has not been able to walk over the past couple of days, states that he is unsure why.  States that he is able to ambulate with a walker normally around the house.  Patient denies any pain anywhere, no chest pain, shortness of breath, abdominal pain nausea, vomiting.  Denies any toe pain.  States that he did fall out of bed 4 days ago, states that he did not hit his head, denies any loss of consciousness.  States that he has not fallen since then.  Patient states that he is been following the wound doctor for his right great toe, last time seen in May, does not have a wound care nurse.  Patient is alert and oriented x3, however is unsure exactly why he is here.  States that wife who is not at bedside primarily takes care of him.  Patient states that he has been eating drinking and going to the bathroom normally.  No headache, dizziness, syncope, numbness, paresthesias.    Wife arrived 2 hours later, able to supplement HPI.  She tells me that she wanted him to come to the ED yesterday when she saw that he was leaning towards the left side and unable to feed himself last night, states this is never happened before and it resolved this morning.  States that he has a history of TIA and she thinks that he might of had 1 last night. No facial droop, slurring speech at that time.   Patient is still continuing to  lean slightly towards the left with me, however wife states that he is back to baseline.States that she is also concerned about his toe getting worse.   Marland Kitchen  HPI     Past Medical History:  Diagnosis Date   Aortic stenosis    BPH (benign prostatic hypertrophy)    Branch retinal vein occlusion 08/01/2013   CAD (coronary artery disease)    CVA (cerebral infarction)    Diabetes mellitus (HCC)    Diabetic retinopathy (HCC)    Gait disturbance    GERD (gastroesophageal reflux disease)    Gout    Heart murmur    HTN (hypertension)    Macular degeneration    Macular degeneration    Macular edema    Paresthesia    Pseudophakia of right eye    Ptosis     Patient Active Problem List   Diagnosis Date Noted   Acute kidney injury (HCC) 03/04/2021   Cellulitis 05/06/2019   Left leg cellulitis    Acquired hammertoes of both feet 12/20/2018   Bilateral impacted cerumen 08/11/2018   Hearing loss 08/11/2018   Pseudophakia of right eye    Macular edema    Heart murmur    Gout    GERD (gastroesophageal reflux disease)    Diabetic retinopathy (HCC)    CAD (coronary artery disease)    UTI (  urinary tract infection)    Bacteremia    Leukocytosis    Hyperlipidemia LDL goal <70    Legally blind    Acute ischemic stroke (HCC) 04/27/2017   Stroke (cerebrum) (HCC) 04/27/2017   Vertigo 04/25/2017   CKD (chronic kidney disease) stage 3, GFR 30-59 ml/min (HCC) 04/25/2017   Mechanical breakdown of intraocular lens 09/09/2015   Ocular hypertension of left eye 09/09/2015   Bunion 07/26/2014   Acute encephalopathy 01/03/2014   Aortic valve replaced 01/03/2014   CAD in native artery 01/03/2014   Renal cyst 01/03/2014   Fever 01/02/2014   Anemia of chronic kidney failure, stage 3 (moderate) (HCC) 12/12/2013   Diabetic nephropathy associated with type 2 diabetes mellitus (HCC) 12/12/2013   Iron deficiency anemia 12/12/2013   Proteinuria 12/12/2013   Secondary hyperparathyroidism of renal  origin (HCC) 12/12/2013   Vitamin D deficiency 12/12/2013   Status post intraocular lens implant 11/09/2013   Pain in lower limb 10/26/2013   PCO (posterior capsular opacification) 10/05/2013   Combined senile cataract 08/06/2013   Branch retinal vein occlusion 08/01/2013   Severe hypoxemia 04/07/2013   Acute on chronic heart failure (HCC) 01/14/2013   Onychomycosis 11/17/2012   Pain in joint, ankle and foot 11/17/2012   Aortic stenosis 02/25/2012   Abnormal ECG 02/25/2012   HTN (hypertension)    Paresthesia    Diabetes mellitus (HCC)    Macular degeneration    CVA (cerebral infarction)    Ptosis    Gait disturbance    Central serous chorioretinopathy 09/14/2011   History of stroke 08/13/2011   Type 2 diabetes mellitus without complications (HCC) 06/11/2011    Past Surgical History:  Procedure Laterality Date   AORTIC VALVE REPLACEMENT  03/26/2013   CATARACT EXTRACTION W/ INTRAOCULAR LENS IMPLANT  11/01/2013   CATARACT EXTRACTION W/ INTRAOCULAR LENS IMPLANT Right 12/18/2009   COLONOSCOPY W/ POLYPECTOMY     CORONARY ARTERY BYPASS GRAFT  03/26/2013   EYE SURGERY  03/19/2016   Ocular tube shunt insertion   PARS PLANA VITRECTOMY  03/19/2016   PARS PLANA VITRECTOMY  11/08/2013   PARS PLANA VITRECTOMY  08/02/2012   PROSTATE SURGERY     TEE WITHOUT CARDIOVERSION N/A 05/02/2017   Procedure: TRANSESOPHAGEAL ECHOCARDIOGRAM (TEE);  Surgeon: Lars MassonNelson, Katarina H, MD;  Location[facility] ENDOSCOPY;  Service: Cardiovascular;  Laterality: N/A;       Family History  Problem Relation Age of Onset   Breast cancer Sister    Lung cancer Brother     Social History   Tobacco Use   Smoking status: Former    Packs/day: 1.00    Years: 40.00    Pack years: 40.00    Types: Cigarettes   Smokeless tobacco: Never  Substance Use Topics   Alcohol use: No    Alcohol/week: 0.0 standard drinks   Drug use: No    Home Medications Prior to Admission medications   Medication Sig Start Date End  Date Taking? Authorizing Provider  allopurinol (ZYLOPRIM) 100 MG tablet Take 200 mg by mouth at bedtime.  11/07/11  Yes [provider]  amLODipine (NORVASC) 5 MG tablet Take 5 mg by mouth daily. 04/14/20  Yes [provider]  aspirin 81 MG chewable tablet Chew 81 mg by mouth daily.   Yes [provider]  docusate sodium (COLACE) 100 MG capsule Take 100 mg by mouth 2 (two) times daily.   Yes [provider]  furosemide (LASIX) 40 MG tablet Take 40 mg by mouth daily.  Yes [provider]  hydrALAZINE (APRESOLINE) 50 MG tablet Take 50 mg by mouth 3 (three) times daily.  12/26/11  Yes [provider]  insulin lispro (HUMALOG) 100 UNIT/ML injection Inject 10-16 Units into the skin 3 (three) times daily before meals. Below 200 units = 10units 200-250=12 units 250-300=14units Over 300=16units   Yes [provider]  metoprolol (LOPRESSOR) 50 MG tablet Take 50 mg by mouth 2 (two) times daily.   Yes [provider]  mupirocin ointment (BACTROBAN) 2 % Apply 1 application topically 2 (two) times daily. 07/01/20  Yes Hyatt, Max T, DPM  Omeprazole-Sodium Bicarbonate (ZEGERID) 20-1100 MG CAPS capsule Take 1 capsule by mouth daily before breakfast.   Yes [provider]  rosuvastatin (CRESTOR) 10 MG tablet Take 10 mg by mouth daily.   Yes [provider]  senna (SENOKOT) 8.6 MG TABS tablet Take 1 tablet by mouth daily.   Yes [provider]  zinc gluconate 50 MG tablet Take 50 mg by mouth daily.   Yes [provider]  insulin glargine (LANTUS) 100 UNIT/ML injection Inject 0.35 mLs (35 Units total) into the skin at bedtime. Patient taking differently: Inject 24 Units into the skin at bedtime. 05/07/19   Hongalgi, Maximino Greenland, MD    Allergies    Glyxambi [empagliflozin-linagliptin] and Heparin  Review of Systems   Review of Systems  Constitutional:  Negative for chills, diaphoresis, fatigue and fever.   HENT:  Negative for congestion, sore throat and trouble swallowing.   Eyes:  Negative for pain and visual disturbance.  Respiratory:  Negative for cough, shortness of breath and wheezing.   Cardiovascular:  Negative for chest pain, palpitations and leg swelling.  Gastrointestinal:  Negative for abdominal distention, abdominal pain, diarrhea, nausea and vomiting.  Genitourinary:  Negative for difficulty urinating.  Musculoskeletal:  Negative for back pain, neck pain and neck stiffness.  Skin:  Negative for pallor.  Neurological:  Positive for weakness. Negative for dizziness, speech difficulty and headaches.  Psychiatric/Behavioral:  Negative for confusion.    Physical Exam Updated Vital Signs BP (!) 183/83   Pulse 70   Temp 98.4 F (36.9 C) (Oral)   Resp 15   Ht 5\' 8"  (1.727 m)   Wt 92.5 kg   SpO2 92%   BMI 31.02 kg/m   Physical Exam Constitutional:      General: He is not in acute distress.    Appearance: Normal appearance. He is not ill-appearing, toxic-appearing or diaphoretic.  HENT:     Head:     Comments: Pt does lean to left side, no facial droop    Mouth/Throat:     Mouth: Mucous membranes are moist.     Pharynx: Oropharynx is clear.  Eyes:     General: No scleral icterus.    Comments: Pt is blind  Cardiovascular:     Rate and Rhythm: Normal rate and regular rhythm.     Pulses: Normal pulses.     Heart sounds: Normal heart sounds.  Pulmonary:     Effort: Pulmonary effort is normal. No respiratory distress.     Breath sounds: Normal breath sounds. No stridor. No wheezing, rhonchi or rales.  Chest:     Chest wall: No tenderness.  Abdominal:     General: Abdomen is flat. There is no distension.     Palpations: Abdomen is soft.     Tenderness: There is no abdominal tenderness. There is no guarding or rebound.  Musculoskeletal:  General: No swelling or tenderness. Normal range of motion.     Cervical back: Normal range of motion and neck supple. No  rigidity.     Right lower leg: No edema.     Left lower leg: No edema.     Comments: Good strength to bilateral upper extremities 5 out of 5, lower extremities with 4 out of 5 strength bilaterally.  Bilateral lower legs wit pitting edema, no erythema or tenderness to palpation.  Area is not hot.  Great toe on right foot with ulcers formation, is purulent and draining, does have odor.  Foot is warm to touch.  Skin:    General: Skin is warm and dry.     Capillary Refill: Capillary refill takes less than 2 seconds.     Coloration: Skin is not pale.     Comments: No decubitus.  Neurological:     General: No focal deficit present.     Mental Status: He is alert and oriented to person, place, and time. Mental status is at baseline.     Cranial Nerves: No cranial nerve deficit.     Sensory: No sensory deficit.  Psychiatric:        Mood and Affect: Mood normal.        Behavior: Behavior normal.    ED Results / Procedures / Treatments   Labs (all labs ordered are listed, but only abnormal results are displayed) Labs Reviewed  BASIC METABOLIC PANEL - Abnormal; Notable for the following components:      Result Value   Glucose, Bld 183 (*)    BUN 26 (*)    Creatinine, Ser 2.41 (*)    GFR, Estimated 26 (*)    All other components within normal limits  RESP PANEL BY RT-PCR (FLU A&B, COVID) ARPGX2  CBC  URINALYSIS, ROUTINE W REFLEX MICROSCOPIC  CBG MONITORING, ED    EKG EKG Interpretation  Date/Time:  Wednesday March 04 2021 07:48:04 EDT Ventricular Rate:  57 PR Interval:  212 QRS Duration: 134 QT Interval:  478 QTC Calculation: 465 R Axis:   -39 Text Interpretation: Sinus bradycardia with 1st degree A-V block Left axis deviation Left ventricular hypertrophy with QRS widening ( R in aVL , Cornell product ) Abnormal ECG Confirmed by Kristine Royal [id number]) on 03/04/2021 7:19:44 PM  Radiology CT Head Wo Contrast  Result Date: 03/04/2021 CLINICAL DATA:  Increasing weakness EXAM: CT  HEAD WITHOUT CONTRAST TECHNIQUE: Contiguous axial images were obtained from the base of the skull through the vertex without intravenous contrast. COMPARISON:  04/25/2017 FINDINGS: Brain: No evidence of acute infarction, hemorrhage, hydrocephalus, extra-axial collection or mass lesion/mass effect. Chronic atrophic and ischemic changes are noted similar to that seen on the prior exam. Vascular: No hyperdense vessel or unexpected calcification. Skull: Normal. Negative for fracture or focal lesion. Sinuses/Orbits: Postsurgical changes in the left orbit are noted stable from the prior exam. Other: None. IMPRESSION: Chronic atrophic and ischemic changes without acute abnormality. Electronically Signed   By: Alcide Clever M.D.   On: 03/04/2021 20:25   DG Toe Great Right  Result Date: 03/04/2021 CLINICAL DATA:  Wound to the right first toe.  Looks infected. EXAM: RIGHT GREAT TOE COMPARISON:  None. FINDINGS: Diffuse bone demineralization. Degenerative changes in the interphalangeal joint, first metatarsal phalangeal joint, and first tarsometatarsal joint. Diffuse soft tissue swelling of the right first toe with evidence of focal skin ulceration along the anteromedial aspect. Suggestion of focal periarticular cortical erosion along the lateral base of the distal  phalanx. This could indicate osteomyelitis or arthritis. No acute fracture or dislocation. IMPRESSION: Soft tissue swelling and soft tissue defect consistent with ulceration. Periarticular cortical erosion at the interphalangeal joint may represent osteomyelitis or arthritis. Degenerative changes and diffuse demineralization. Electronically Signed   By: Burman Nieves M.D.   On: 03/04/2021 17:46    Procedures Procedures   Medications Ordered in ED Medications  cefTRIAXone (ROCEPHIN) 2 g in sodium chloride 0.9 % 100 mL IVPB (0 g Intravenous Stopped 03/04/21 2121)  vancomycin (VANCOREADY) IVPB 1750 mg/350 mL (1,750 mg Intravenous New Bag/Given 03/04/21 2134)   vancomycin variable dose per unstable renal function (pharmacist dosing) (has no administration in time range)  sodium chloride 0.9 % bolus 1,000 mL (0 mLs Intravenous Stopped 03/04/21 1930)  hydrALAZINE (APRESOLINE) tablet 50 mg (50 mg Oral Given 03/04/21 1745)    ED Course  I have reviewed the triage vital signs and the nursing notes.  Pertinent labs & imaging results that were available during my care of the patient were reviewed by me and considered in my medical decision making (see chart for details).    MDM Rules/Calculators/A&P                          87 year old male that presents to the emergency department today for weakness, per wife has an episode of slumping to the side and abnormal coordination last night, states that he has been weaker than normal.  Patient with nonfocal neuro exam,.  Wife states that he is back to baseline.  Patient is slightly slumped over to the left side, however when he is repositioned he will stay midline.  CT head without any acute intracranial abnormality.  Work-up today does show unremarkable CBC, CMP with elevated creatinine from baseline. New AKI, will give fluids.  Toe appears infected, purulent, plain films show osteo vs arthritis, favor osteomyelitis. Distally neurovascularly intact. We will start abx. Pt does not appear septic.   Patient to be admitted to the hospitalist service.  Pt to be admitted to Dr. Leafy Half.   The patient appears reasonably stabilized for admission considering the current resources, flow, and capabilities available in the ED at this time, and I doubt any other [facility] requiring further screening and/or treatment in the ED prior to admission.  I discussed this case with my attending physician who cosigned this note including patient's presenting symptoms, physical exam, and planned diagnostics and interventions. Attending physician stated agreement with plan or made changes to plan which were implemented.     Final  Clinical Impression(s) / ED Diagnoses Final diagnoses:  AKI (acute kidney injury) (HCC)  Toe infection  Weakness    Rx / DC Orders ED Discharge Orders     None        Farrel Gordon, PA-C 03/04/21 2327    Wynetta Fines, MD 03/07/21 0002

## 2021-03-04 NOTE — ED Triage Notes (Signed)
Pt arrived by [facility] from home. Reports increase in generalized weakness yesterday. Normally ambulatory with walker at home but now needing more assistance.

## 2021-03-04 NOTE — Progress Notes (Addendum)
Pharmacy Antibiotic Note  Johnny [name] is a 87 y.o. male admitted on 03/04/2021 with  osteomyelitis .  Pharmacy has been consulted for vancomycin dosing.  Patient with history of  aortic stenosis, CAD, CVA, diabetes, blind due to diabetic retinopathy and macular degeneration. Patient presenting with generalized weakness. Patient states that wife is concerned about a potential toe infection.  XR w/ Soft tissue swelling and soft tissue defect consistent with ulceration. Periarticular cortical erosion at the interphalangeal joint may represent osteomyelitis or arthritis.  SCr 2.41 - significantly above baseline  Plan: Ceftriaxone per MD Vancomycin 1750 mg once - subsequent dosing to be determined by level unless renal function improves/stabilizes F/u imaging and cultures Monitor renal function De-escalate antibiotics as indicated     Temp (24hrs), Avg:98.2 F (36.8 C), Min:98 F (36.7 C), Max:98.4 F (36.9 C)  Recent Labs  Lab 03/04/21 0745  WBC 10.4  CREATININE 2.41*    CrCl cannot be calculated (Unknown ideal weight.).    Allergies  Allergen Reactions   Glyxambi [Empagliflozin-Linagliptin] Other (See Comments)    Lethargy, slurring speaking.    Heparin     Other reaction(s): Heparin-Induced Thrombocytopenia   Antimicrobials this admission: ceftriaxone 8/24 >>  vancomycin 8/24 >>   Microbiology results: Pending  Thank you for allowing pharmacy to be a part of this patient's care.  Cathie Hoops 03/04/2021 7:03 PM

## 2021-03-04 NOTE — H&P (Signed)
History and Physical    Johnny [name] ZOX:[id number] DOB: 1933-03-21 DOA: 03/04/2021  PCP: Merri Brunette, MD  Patient coming from: Home   Chief Complaint:  Chief Complaint  Patient presents with   Weakness     HPI:    87 year old male with past medical history of chronic kidney disease stage IIIb, insulin-dependent diabetes mellitus type 2, hypertension, aortic stenosis status post aortic valve replacement (05/2013), coronary artery disease status post CABG (2 vessel, 2014), gastroesophageal reflux disease, gout, macular degeneration, hyperlipidemia who presents to [facility] emergency department due to concerns for progressive weakness.  Patient is a somewhat poor historian therefore majority the history has been obtained from the wife via phone conversation.  Portions of the history have additionally been obtained from the patient himself.  Wife explains that approximately 1 month ago he began to develop an ulceration over the right great toe.  Over the course the next several weeks this became associated with increasing pain swelling and redness of the area.  Approximately 1 week ago patient began to experience generalized weakness which resulted in the patient falling out of bed without loss of consciousness at that time.  Continued to experience progressively worsening generalized weakness with associated poor appetite.  Patient additionally began to develop worsening pain of the right foot, moderate intensity, sharp in quality and worse with weightbearing.  This is associated with increasing purulence and foul smell of the foot.  Wife reports that patient's weakness was so severe the evening of 8/23 that he was having difficulty sitting up in his chair at dinner and had to be fed by his wife.  The following morning on 8/24 the patient was brought into [facility] emergency department for evaluation  Upon evaluation in the emergency department, patient was found to  have a an elevated creatinine of 2.41, up from his baseline of approximately 1.8.  Patient was found to have clinical evidence of dehydration including dry mucous membranes.  Patient was found to have an ulceration of the dorsal surface of the great toe of the right foot.  An x-ray was performed revealing soft tissue swelling and possible osteomyelitis of the interphalangeal joint.  Patient was administered doses of intravenous vancomycin and ceftriaxone for suspected osteomyelitis.  The hospitalist group was then called to assess the patient for admission to the hospital.  Review of Systems:   Review of Systems  Constitutional:  Positive for malaise/fatigue.  Musculoskeletal:        Right foot pain  Neurological:  Positive for weakness.  All other systems reviewed and are negative.  Past Medical History:  Diagnosis Date   Aortic stenosis    BPH (benign prostatic hypertrophy)    Branch retinal vein occlusion 08/01/2013   CAD (coronary artery disease)    CVA (cerebral infarction)    Diabetes mellitus (HCC)    Diabetic retinopathy (HCC)    Gait disturbance    GERD (gastroesophageal reflux disease)    Gout    Heart murmur    HTN (hypertension)    Macular degeneration    Macular degeneration    Macular edema    Paresthesia    Pseudophakia of right eye    Ptosis     Past Surgical History:  Procedure Laterality Date   AORTIC VALVE REPLACEMENT  03/26/2013   CATARACT EXTRACTION W/ INTRAOCULAR LENS IMPLANT  11/01/2013   CATARACT EXTRACTION W/ INTRAOCULAR LENS IMPLANT Right 12/18/2009   COLONOSCOPY W/ POLYPECTOMY     CORONARY ARTERY BYPASS  GRAFT  03/26/2013   EYE SURGERY  03/19/2016   Ocular tube shunt insertion   PARS PLANA VITRECTOMY  03/19/2016   PARS PLANA VITRECTOMY  11/08/2013   PARS PLANA VITRECTOMY  08/02/2012   PROSTATE SURGERY     TEE WITHOUT CARDIOVERSION N/A 05/02/2017   Procedure: TRANSESOPHAGEAL ECHOCARDIOGRAM (TEE);  Surgeon: Lars MassonNelson, Katarina H, MD;  [facility];  Service: Cardiovascular;  Laterality: N/A;     reports that he has quit smoking. His smoking use included cigarettes. He has a 40.00 pack-year smoking history. He has never used smokeless tobacco. He reports that he does not drink alcohol and does not use drugs.  Allergies  Allergen Reactions   Glyxambi [Empagliflozin-Linagliptin] Other (See Comments)    Lethargy, slurring speaking.    Heparin     Other reaction(s): Heparin-Induced Thrombocytopenia    Family History  Problem Relation Age of Onset   Breast cancer Sister    Lung cancer Brother      Prior to Admission medications   Medication Sig Start Date End Date Taking? Authorizing Provider  allopurinol (ZYLOPRIM) 100 MG tablet Take 200 mg by mouth at bedtime.  11/07/11  Yes [provider]  amLODipine (NORVASC) 5 MG tablet Take 5 mg by mouth daily. 04/14/20  Yes [provider]  aspirin 81 MG chewable tablet Chew 81 mg by mouth daily.   Yes [provider]  docusate sodium (COLACE) 100 MG capsule Take 100 mg by mouth 2 (two) times daily.   Yes [provider]  furosemide (LASIX) 40 MG tablet Take 40 mg by mouth daily.    Yes [provider]  hydrALAZINE (APRESOLINE) 50 MG tablet Take 50 mg by mouth 3 (three) times daily.  12/26/11  Yes [provider]  insulin lispro (HUMALOG) 100 UNIT/ML injection Inject 10-16 Units into the skin 3 (three) times daily before meals. Below 200 units = 10units 200-250=12 units 250-300=14units Over 300=16units   Yes [provider]  metoprolol (LOPRESSOR) 50 MG tablet Take 50 mg by mouth 2 (two) times daily.   Yes [provider]  mupirocin ointment (BACTROBAN) 2 % Apply 1 application topically 2 (two) times daily. 07/01/20  Yes Hyatt, Max T, DPM  Omeprazole-Sodium Bicarbonate (ZEGERID) 20-1100 MG CAPS capsule Take 1 capsule by mouth daily before breakfast.   Yes [provider]  rosuvastatin (CRESTOR) 10 MG  tablet Take 10 mg by mouth daily.   Yes [provider]  senna (SENOKOT) 8.6 MG TABS tablet Take 1 tablet by mouth daily.   Yes [provider]  zinc gluconate 50 MG tablet Take 50 mg by mouth daily.   Yes [provider]  insulin glargine (LANTUS) 100 UNIT/ML injection Inject 0.35 mLs (35 Units total) into the skin at bedtime. Patient taking differently: Inject 24 Units into the skin at bedtime. 05/07/19   Elease EtienneHongalgi, Anand D, MD    Physical Exam: Vitals:   03/04/21 1700 03/04/21 1924 03/04/21 1924 03/04/21 2119  BP: (!) 182/80  (!) 203/91 (!) 183/83  Pulse: 62  72 70  Resp: 18  13 15   Temp:   98.4 F (36.9 C)   TempSrc:   Oral   SpO2: 92%  94% 92%  Weight:  92.5 kg    Height:  5\' 8"  (1.727 m)      Constitutional: Lethargic but arousable, oriented x3, no associated distress.   Skin: Notable foul-smelling ulceration noted on the dorsal surface of the right great toe.  Associated hyperemia  and induration of the surrounding tissues., somewhat poor skin turgor noted. Eyes: Pupils are equally reactive to light.  No evidence of scleral icterus or conjunctival pallor.  ENMT: Extremely dry mucous membranes noted.  Posterior pharynx clear of any exudate or lesions.   Neck: normal, supple, no masses, no thyromegaly.  No evidence of jugular venous distension.   Respiratory: clear to auscultation bilaterally, no wheezing, no crackles. Normal respiratory effort. No accessory muscle use.  Cardiovascular: Regular rate and rhythm, no murmurs / rubs / gallops.  Pitting edema of the distal bilateral lower extremities.  2+ pedal pulses. No carotid bruits.  Chest:   Nontender without crepitus or deformity.   Back:   Nontender without crepitus or deformity. Abdomen: Abdomen is soft and nontender.  No evidence of intra-abdominal masses.  Positive bowel sounds noted in all quadrants.   Musculoskeletal: No joint deformity upper and lower extremities. Good ROM, no contractures. Normal  muscle tone.  Neurologic: Patient is lethargic but arousable and oriented x3.  Patient is grossly blind.  Sensation is intact.  Patient moving all 4 extremities spontaneously.  Patient is following all commands.  Patient is responsive to verbal stimuli.   Psychiatric: Patient exhibits normal mood with flat affect.  Patient seems to possess insight as to their current situation.     Labs on Admission: I have personally reviewed following labs and imaging studies -   CBC: Recent Labs  Lab 03/04/21 0745  WBC 10.4  HGB 15.4  HCT 47.4  MCV 89.3  PLT 239   Basic Metabolic Panel: Recent Labs  Lab 03/04/21 0745  NA 135  K 4.2  CL 100  CO2 26  GLUCOSE 183*  BUN 26*  CREATININE 2.41*  CALCIUM 9.9   GFR: Estimated Creatinine Clearance: 24.3 mL/min (A) (by C-G formula based on SCr of 2.41 mg/dL (H)). Liver Function Tests: No results for input(s): AST, ALT, ALKPHOS, BILITOT, PROT, ALBUMIN in the last 168 hours. No results for input(s): LIPASE, AMYLASE in the last 168 hours. No results for input(s): AMMONIA in the last 168 hours. Coagulation Profile: No results for input(s): INR, PROTIME in the last 168 hours. Cardiac Enzymes: No results for input(s): CKTOTAL, CKMB, CKMBINDEX, TROPONINI in the last 168 hours. BNP (last 3 results) No results for input(s): PROBNP in the last 8760 hours. HbA1C: No results for input(s): HGBA1C in the last 72 hours. CBG: No results for input(s): GLUCAP in the last 168 hours. Lipid Profile: No results for input(s): CHOL, HDL, LDLCALC, TRIG, CHOLHDL, LDLDIRECT in the last 72 hours. Thyroid Function Tests: No results for input(s): TSH, T4TOTAL, FREET4, T3FREE, THYROIDAB in the last 72 hours. Anemia Panel: No results for input(s): VITAMINB12, FOLATE, FERRITIN, TIBC, IRON, RETICCTPCT in the last 72 hours. Urine analysis:    Component Value Date/Time   COLORURINE YELLOW 03/04/2021 1922   APPEARANCEUR CLEAR 03/04/2021 1922   LABSPEC 1.008 03/04/2021  1922   PHURINE 7.0 03/04/2021 1922   GLUCOSEU NEGATIVE 03/04/2021 1922   HGBUR NEGATIVE 03/04/2021 1922   BILIRUBINUR NEGATIVE 03/04/2021 1922   KETONESUR NEGATIVE 03/04/2021 1922   PROTEINUR 100 (A) 03/04/2021 1922   UROBILINOGEN 0.2 01/02/2014 2236   NITRITE NEGATIVE 03/04/2021 1922   LEUKOCYTESUR NEGATIVE 03/04/2021 1922    Radiological Exams on Admission - Personally Reviewed: CT Head Wo Contrast  Result Date: 03/04/2021 CLINICAL DATA:  Increasing weakness EXAM: CT HEAD WITHOUT CONTRAST TECHNIQUE: Contiguous axial images were obtained from the base of the skull through the vertex without intravenous contrast. COMPARISON:  04/25/2017 FINDINGS: Brain: No evidence of acute infarction, hemorrhage, hydrocephalus, extra-axial collection or mass lesion/mass effect. Chronic atrophic and ischemic changes are noted similar to that seen on the prior exam. Vascular: No hyperdense vessel or unexpected calcification. Skull: Normal. Negative for fracture or focal lesion. Sinuses/Orbits: Postsurgical changes in the left orbit are noted stable from the prior exam. Other: None. IMPRESSION: Chronic atrophic and ischemic changes without acute abnormality. Electronically Signed   By: Alcide Clever M.D.   On: 03/04/2021 20:25   DG Toe Great Right  Result Date: 03/04/2021 CLINICAL DATA:  Wound to the right first toe.  Looks infected. EXAM: RIGHT GREAT TOE COMPARISON:  None. FINDINGS: Diffuse bone demineralization. Degenerative changes in the interphalangeal joint, first metatarsal phalangeal joint, and first tarsometatarsal joint. Diffuse soft tissue swelling of the right first toe with evidence of focal skin ulceration along the anteromedial aspect. Suggestion of focal periarticular cortical erosion along the lateral base of the distal phalanx. This could indicate osteomyelitis or arthritis. No acute fracture or dislocation. IMPRESSION: Soft tissue swelling and soft tissue defect consistent with ulceration.  Periarticular cortical erosion at the interphalangeal joint may represent osteomyelitis or arthritis. Degenerative changes and diffuse demineralization. Electronically Signed   By: Burman Nieves M.D.   On: 03/04/2021 17:46    EKG: Personally reviewed.  Rhythm is sinus bradycardia with heart rate of 57 bpm.  Evidence of first-degree AV block with LVH  Assessment/Plan Principal Problem:   Osteomyelitis of great toe of right foot (HCC)  Patient presenting with a 1 month history of ulceration on the dorsal surface of the right great toe this ulceration has progressively become more foul-smelling with intermittent purulent drainage according to the family as of late. X-ray of the right foot performed in the emergency department suggestive of possible osteomyelitis Patient has been initiated on intravenous ceftriaxone and vancomycin by the emergency department staff which I will continue at this time Gentle intravenous hydration Blood cultures and CRP have been ordered Will obtain MRI of the foot and if osteomyelitis is confirmed we will consult podiatry or orthopedics in the morning. Wound care consultation additionally ordered, their input is appreciated.  Active Problems:   Acute renal failure superimposed on stage 3b chronic kidney disease (HCC)  Notable elevation in creatinine compared to baseline Wife reports poor oral intake over the past several days in the setting of increasing generalized weakness X-ray reveals mild distal bilateral lower extremity pitting edema which is likely secondary to third spacing from poor nutrition and not secondary to volume overload Placing patient on gentle intravenous hydration Strict input and output monitoring Monitoring renal function and electrolytes with serial chemistries Avoiding nephrotoxic agents if at all possible    Essential hypertension  Resuming home regimen of antihypertensive therapy As needed intravenous and hypertensives for  markedly elevated blood pressure    GERD without esophagitis  Continue home regimen of PPI    Coronary artery disease involving native heart without angina pectoris  Continue home regimen of aspirin, statin, beta-blocker therapy Patient is currently chest pain-free Monitoring patient on telemetry    Type 2 diabetes mellitus with stage 3b chronic kidney disease, with long-term current use of insulin (HCC)  Accu-Cheks before every meal and nightly with sliding scale insulin Hemoglobin A1c pending Resume home regimen of basal bolus insulin therapy    Mixed diabetic hyperlipidemia associated with type 2 diabetes mellitus (HCC)  Continue home regimen of statin therapy    Gout  Continue home regimen of allopurinol  Code Status:  Full code Family Communication: Discussed plan of care with wife via phone conversation.  Status is: Inpatient  Remains inpatient appropriate because:Ongoing diagnostic testing needed not appropriate for outpatient work up, IV treatments appropriate due to intensity of illness or inability to take PO, and Inpatient level of care appropriate due to severity of illness  Dispo: The patient is from: Home              Anticipated d/c is to: Home              Patient currently is not medically stable to d/c.   Difficult to place patient No        Marinda Elk MD [facility] Pager [phone]  If 7PM-7AM, please contact night-coverage [email or web address] Use universal [place] password for that web site. If you do not have the password, please call the hospital operator.  03/04/2021, 11:50 PM

## 2021-03-04 NOTE — ED Notes (Signed)
Patient transported to CT 

## 2021-03-05 ENCOUNTER — Inpatient Hospital Stay (HOSPITAL_COMMUNITY): Payer: Medicare Other

## 2021-03-05 DIAGNOSIS — M86671 Other chronic osteomyelitis, right ankle and foot: Secondary | ICD-10-CM

## 2021-03-05 LAB — HEPATIC FUNCTION PANEL
ALT: 25 U/L (ref 0–44)
AST: 31 U/L (ref 15–41)
Albumin: 3.3 g/dL — ABNORMAL LOW (ref 3.5–5.0)
Alkaline Phosphatase: 62 U/L (ref 38–126)
Bilirubin, Direct: 0.2 mg/dL (ref 0.0–0.2)
Indirect Bilirubin: 0.8 mg/dL (ref 0.3–0.9)
Total Bilirubin: 1 mg/dL (ref 0.3–1.2)
Total Protein: 6.9 g/dL (ref 6.5–8.1)

## 2021-03-05 LAB — CBC WITH DIFFERENTIAL/PLATELET
Abs Immature Granulocytes: 0.04 10*3/uL (ref 0.00–0.07)
Basophils Absolute: 0.1 10*3/uL (ref 0.0–0.1)
Basophils Relative: 1 %
Eosinophils Absolute: 0.4 10*3/uL (ref 0.0–0.5)
Eosinophils Relative: 4 %
HCT: 48.9 % (ref 39.0–52.0)
Hemoglobin: 15.8 g/dL (ref 13.0–17.0)
Immature Granulocytes: 0 %
Lymphocytes Relative: 19 %
Lymphs Abs: 2.1 10*3/uL (ref 0.7–4.0)
MCH: 28.9 pg (ref 26.0–34.0)
MCHC: 32.3 g/dL (ref 30.0–36.0)
MCV: 89.4 fL (ref 80.0–100.0)
Monocytes Absolute: 1 10*3/uL (ref 0.1–1.0)
Monocytes Relative: 9 %
Neutro Abs: 7.4 10*3/uL (ref 1.7–7.7)
Neutrophils Relative %: 67 %
Platelets: 242 10*3/uL (ref 150–400)
RBC: 5.47 MIL/uL (ref 4.22–5.81)
RDW: 13.7 % (ref 11.5–15.5)
WBC: 11.1 10*3/uL — ABNORMAL HIGH (ref 4.0–10.5)
nRBC: 0 % (ref 0.0–0.2)

## 2021-03-05 LAB — GLUCOSE, CAPILLARY
Glucose-Capillary: 122 mg/dL — ABNORMAL HIGH (ref 70–99)
Glucose-Capillary: 108 mg/dL — ABNORMAL HIGH (ref 70–99)
Glucose-Capillary: 81 mg/dL (ref 70–99)
Glucose-Capillary: 167 mg/dL — ABNORMAL HIGH (ref 70–99)

## 2021-03-05 LAB — COMPREHENSIVE METABOLIC PANEL
ALT: 26 U/L (ref 0–44)
AST: 31 U/L (ref 15–41)
Albumin: 3.4 g/dL — ABNORMAL LOW (ref 3.5–5.0)
Alkaline Phosphatase: 62 U/L (ref 38–126)
Anion gap: 10 (ref 5–15)
BUN: 22 mg/dL (ref 8–23)
CO2: 24 mmol/L (ref 22–32)
Calcium: 10.3 mg/dL (ref 8.9–10.3)
Chloride: 104 mmol/L (ref 98–111)
Creatinine, Ser: 1.95 mg/dL — ABNORMAL HIGH (ref 0.61–1.24)
GFR, Estimated: 33 mL/min — ABNORMAL LOW (ref >60)
Glucose, Bld: 111 mg/dL — ABNORMAL HIGH (ref 70–99)
Potassium: 4.2 mmol/L (ref 3.5–5.1)
Sodium: 138 mmol/L (ref 135–145)
Total Bilirubin: 1 mg/dL (ref 0.3–1.2)
Total Protein: 7 g/dL (ref 6.5–8.1)

## 2021-03-05 LAB — HEMOGLOBIN A1C
Hgb A1c MFr Bld: 5.2 % (ref 4.8–5.6)
Mean Plasma Glucose: 102.54 mg/dL

## 2021-03-05 LAB — CBG MONITORING, ED: Glucose-Capillary: 102 mg/dL — ABNORMAL HIGH (ref 70–99)

## 2021-03-05 LAB — C-REACTIVE PROTEIN: CRP: 0.5 mg/dL (ref <1.0)

## 2021-03-05 LAB — MAGNESIUM: Magnesium: 2.1 mg/dL (ref 1.7–2.4)

## 2021-03-05 LAB — LACTIC ACID, PLASMA: Lactic Acid, Venous: 1.3 mmol/L (ref 0.5–1.9)

## 2021-03-05 MED ORDER — ADULT MULTIVITAMIN W/MINERALS CH
1.0000 | ORAL_TABLET | Freq: Every day | ORAL | Status: DC
  Administered 2021-03-07 – 2021-03-11 (×5): 1 via ORAL
  Filled 2021-03-05 (×5): qty 1

## 2021-03-05 MED ORDER — INSULIN GLARGINE-YFGN 100 UNIT/ML ~~LOC~~ SOLN
12.0000 [IU] | Freq: Every day | SUBCUTANEOUS | Status: DC
  Administered 2021-03-05: 12 [IU] via SUBCUTANEOUS
  Filled 2021-03-05 (×2): qty 0.12

## 2021-03-05 MED ORDER — VANCOMYCIN HCL 750 MG/150ML IV SOLN
750.0000 mg | Freq: Every day | INTRAVENOUS | Status: DC
  Administered 2021-03-05 – 2021-03-06 (×2): 750 mg via INTRAVENOUS
  Filled 2021-03-05 (×2): qty 150

## 2021-03-05 NOTE — Consult Note (Signed)
PODIATRY CONSULTATION  NAME RAI SEVERNS MRN [id number] DOB October 13, 1932 DOA 03/04/2021   Reason for consult:  Chief Complaint  Patient presents with   Weakness    Consulting physician: Dr. Ella Jubilee  History of present illness: 87 y.o. blind male presenting at bedside for evaluation of worsening infection to the right great toe.  MRI confirmed osteomyelitis today.  Patient has been treated and managed outpatient up until admission.  Spoke with the patient's wife on the phone this evening who verified that the right great toe had worsened despite outpatient care and oral antibiotics.  Past Medical History:  Diagnosis Date   Aortic stenosis    BPH (benign prostatic hypertrophy)    Branch retinal vein occlusion 08/01/2013   CAD (coronary artery disease)    CVA (cerebral infarction)    Diabetes mellitus (HCC)    Diabetic retinopathy (HCC)    Gait disturbance    GERD (gastroesophageal reflux disease)    Gout    Heart murmur    HTN (hypertension)    Macular degeneration    Macular degeneration    Macular edema    Paresthesia    Pseudophakia of right eye    Ptosis     CBC Latest Ref Rng & Units 03/05/2021 03/04/2021 05/07/2019  WBC 4.0 - 10.5 K/uL 11.1(H) 10.4 9.4  Hemoglobin 13.0 - 17.0 g/dL 41.2 87.8 67.6  Hematocrit 39.0 - 52.0 % 48.9 47.4 46.1  Platelets 150 - 400 K/uL 242 239 210    BMP Latest Ref Rng & Units 03/05/2021 03/04/2021 05/07/2019  Glucose 70 - 99 mg/dL 720(N) 470(J) 628(Z)  BUN 8 - 23 mg/dL 22 66(Q) 21  Creatinine 0.61 - 1.24 mg/dL 9.47(M) 5.46(T) 0.35(W)  Sodium 135 - 145 mmol/L 138 135 140  Potassium 3.5 - 5.1 mmol/L 4.2 4.2 4.0  Chloride 98 - 111 mmol/L 104 100 110  CO2 22 - 32 mmol/L 24 26 23   Calcium 8.9 - 10.3 mg/dL 9.9 9.3       Physical Exam: General: The patient is alert and oriented x3 in no acute distress.   Dermatology: Ulcer noted overlying the IPJ right hallux.  Partial loss of the toenail also noted.  No malodor.  Vascular:  Palpable pedal pulses bilaterally. No edema or erythema noted.  Skin is warm to touch.  Capillary refill within normal limits.  Neurological: Epicritic and protective threshold diminished bilaterally.   Musculoskeletal Exam: No structural deformity noted.  Patient denies any pain to the right great toe  MRI impression today, 06/05/2021, RT WO contrast: IMPRESSION: Bony edematous changes within the first proximal and distal phalanges with adjacent soft tissue ulcer most consistent with osteomyelitis.   Soft tissue swelling is noted in the dorsal aspect of the forefoot without discrete abscess.  ASSESSMENT/PLAN OF CARE Confirmed osteomyelitis right hallux -This evening and had a discussion with the patient and also his spouse on the phone.  Both agree and are on board with amputation of the right great toe.  Preoperative orders placed.  Surgery tentatively scheduled for tomorrow p.m.  N.p.o. after midnight. -Surgery will consist of right great toe amputation -Continue antibiotics as per admitting physician -Podiatry will continue to follow    Thank you for the consult.  Please contact me directly with any questions or concerns.  Cell [phone]   [phone], DPM [facility]  Dr. Felecia Shelling, DPM    [address]  [place], [place] [address]                Office [phone]  Fax [phone]

## 2021-03-05 NOTE — Progress Notes (Signed)
Pharmacy Antibiotic Note  Johnny [name] is a 87 y.o. male admitted on 03/04/2021 with R great toe  osteomyelitis .  Pharmacy has been consulted for vancomycin dosing. Patient is also on ceftriaxone.    8/25 Vancomycin 750mg  Q 24 hr Scr used: 1.95 mg/dL Weight: kg Vd coeff: 0.72 L/kg Est AUC: 463   Plan: Ceftriaxone per MD Vancomycin 750 mg Q 24 hrs Monitor cultures, clinical status, renal fx, vanc level Narrow abx as able and f/u duration  F/u podiatry recs  Height: 5\' 8"  (172.7 cm) Weight: 85.7 kg (188 lb 15 oz) IBW/kg (Calculated) : 68.4  Temp (24hrs), Avg:98.2 F (36.8 C), Min:97.8 F (36.6 C), Max:98.4 F (36.9 C)  Recent Labs  Lab 03/04/21 0745 03/05/21 0151 03/05/21 0152  WBC 10.4  --  11.1*  CREATININE 2.41*  --  1.95*  LATICACIDVEN  --  1.3  --      Estimated Creatinine Clearance: 29 mL/min (A) (by C-G formula based on SCr of 1.95 mg/dL (H)).    Allergies  Allergen Reactions   Glyxambi [Empagliflozin-Linagliptin] Other (See Comments)    Lethargy, slurring speaking.    Heparin     Other reaction(s): Heparin-Induced Thrombocytopenia   Antimicrobials this admission: ceftriaxone 8/24 >>  vancomycin 8/24 >>   Microbiology results: 8/24 Bcx pending   Thank you for allowing pharmacy to be a part of this patient's care.  9/24, PharmD, BCPS, BCCP Clinical Pharmacist  Please check AMION for all [facility] phone numbers After 10:00 PM, call Main Pharmacy [phone]

## 2021-03-05 NOTE — ED Notes (Signed)
Patient transported to MRI 

## 2021-03-05 NOTE — Progress Notes (Signed)
PROGRESS NOTE    Johnny [name]  XQJ:[id number] DOB: 1933-06-15 DOA: 03/04/2021 PCP: Merri Brunette, MD    Brief Narrative:  Johnny [name] was admitted to the hospital with the working diagnosis of right foot great toe osteomyelitis.   87 yo male with the past medical history of chronic renal disease stage IIIb, type 2 diabetes mellitus, hypertension, aortic stenosis status post valve replacement 2014, coronary artery disease status post bypass, GERD, gout and dyslipidemia who presented with progressive weakness.  Reported right foot great toe ulceration for about a month, associated with progressive edema and erythema.  7 days leading to his hospitalization he was noticed to have progressive weakness, generalized, associated with poor appetite.  His wound became purulent and he was brought to the hospital.  On his initial physical examination blood pressure 182/80, heart rate 72, respiratory rate 18, temperature 98.4, oxygen saturation 92%, his lungs are clear to auscultation bilaterally, heart S1-S2, present, rhythmic, soft abdomen, right foot first toe ulcerated wound.  Sodium 135 potassium 4.2, chloride 100, bicarb 26, glucose 183, BUN 26, creatinine 2.41, white count 10.4, hemoglobin 15.4, hematocrit 47.4 platelets 239. SARS COVID-19 negative.  Urinalysis specific gravity 1.008, negative nitrates.  100 protein.  Right foot radiograph with soft tissue edema and soft tissue defect consistent with ulceration.  Particular cortical erosion in the interphalangeal joint may represent osteomyelitis.  EKG 57 bpm, left axis deviation, first-degree AV block, interventricular conduction delay, sinus rhythm, no significant ST segment T wave changes, positive LVH.  Assessment & Plan:   Principal Problem:   Osteomyelitis of great toe of right foot (HCC) Active Problems:   Essential hypertension   Gout   GERD without esophagitis   Coronary artery disease involving native heart without angina pectoris    Acute renal failure superimposed on stage 3b chronic kidney disease (HCC)   Type 2 diabetes mellitus with stage 3b chronic kidney disease, with long-term current use of insulin (HCC)   Mixed diabetic hyperlipidemia associated with type 2 diabetes mellitus (HCC)   Right foot first toe proximal and distal phalanges osteomyelitis. Patient with no significant pain at his right foot.   MRI confirmed osteomyelitis.  Plan to continue supportive medical therapy. Dr Logan Bores has been consulted for possible surgical intervention.   2. T2DM/ dyslipidemia fasting glucose this am was 111. Continue insulin sliding scale for glucose cover and monitoring.  Patient is tolerating po well.  Basal insulin 24 units, will reduce to 12 units to prevent hypoglycemia .  Continue with rosuvastatin.   3. HTN. CAD continue blood pressure monitoring. Continue with amlodipine 5 mg if continue elevated blood pressure will increase to 10 mg. Continue with hydralazine 60 mg tid and metoprolol.   4. AKI on CKD stage 3b renal function with serum cr down to 1,95 with K at 4,2 and serum bicarbonate is 22. Continue close follow up on renal function and electrolytes, avoid hypotension and nephrotoxic medications.   5. GERD continue antiacid therapy   6. Gout. No acute flare, continue with allopurionol  Patient continue to be at high risk for worsening osteomyelitis.   Status is: Inpatient  Remains inpatient appropriate because:Inpatient level of care appropriate due to severity of illness  Dispo: The patient is from: Home              Anticipated d/c is to: Home              Patient currently is not medically stable to d/c.   Difficult to place  patient No   DVT prophylaxis: Enoxaparin   Code Status:    full  Family Communication:     I spoke over the phone with the patient's wife about patient's  condition, plan of care, prognosis and all questions were addressed.    Nutrition Status: Nutrition Problem: Increased  nutrient needs Etiology: wound healing Signs/Symptoms: estimated needs Interventions: Magic cup, MVI   Consultants:  Podiatry   Antimicrobials:  Ceftriaxone and vancomycin     Subjective: Patient with no nausea or vomiting, no chest pain or dyspnea, no significant foot pain,   Objective: Vitals:   03/05/21 0500 03/05/21 0600 03/05/21 0707 03/05/21 1100  BP: (!) 186/92 (!) 198/74 (!) 185/88 (!) 154/79  Pulse: 66 (!) 54 (!) 57 77  Resp: 20 (!) 26 17 20   Temp:    98.3 F (36.8 C)  TempSrc:    Oral  SpO2: 94% 93% 99% 99%  Weight:    85.7 kg  Height:        Intake/Output Summary (Last 24 hours) at 03/05/2021 1304 Last data filed at 03/05/2021 1244 Gross per 24 hour  Intake 1360 ml  Output 300 ml  Net 1060 ml   Filed Weights   03/04/21 1924 03/05/21 1100  Weight: 92.5 kg 85.7 kg    Examination:   General: Not in pain or dyspnea, deconditioned  Neurology: Awake and alert, non focal  E ENT: no pallor, no icterus, oral mucosa moist Cardiovascular: No JVD. S1-S2 present, rhythmic, no gallops, rubs, or murmurs. No lower extremity edema. Pulmonary: positive breath sounds bilaterally, adequate air movement, no wheezing, rhonchi or rales. Gastrointestinal. Abdomen soft and non tender Skin. Ulcerated lesion at the dorsal and medial aspect of 1st toe, positive odor.  Musculoskeletal: no joint deformities       Data Reviewed: I have personally reviewed following labs and imaging studies  CBC: Recent Labs  Lab 03/04/21 0745 03/05/21 0152  WBC 10.4 11.1*  NEUTROABS  --  7.4  HGB 15.4 15.8  HCT 47.4 48.9  MCV 89.3 89.4  PLT 239 242   Basic Metabolic Panel: Recent Labs  Lab 03/04/21 0745 03/05/21 0152  NA 135 138  K 4.2 4.2  CL 100 104  CO2 26 24  GLUCOSE 183* 111*  BUN 26* 22  CREATININE 2.41* 1.95*  CALCIUM 9.9 10.3  MG  --  2.1   GFR: Estimated Creatinine Clearance: 29 mL/min (A) (by C-G formula based on SCr of 1.95 mg/dL (H)). Liver Function  Tests: Recent Labs  Lab 03/05/21 0152  AST 31  31  ALT 25  26  ALKPHOS 62  62  BILITOT 1.0  1.0  PROT 6.9  7.0  ALBUMIN 3.3*  3.4*   No results for input(s): LIPASE, AMYLASE in the last 168 hours. No results for input(s): AMMONIA in the last 168 hours. Coagulation Profile: No results for input(s): INR, PROTIME in the last 168 hours. Cardiac Enzymes: No results for input(s): CKTOTAL, CKMB, CKMBINDEX, TROPONINI in the last 168 hours. BNP (last 3 results) No results for input(s): PROBNP in the last 8760 hours. HbA1C: Recent Labs    03/05/21 0152  HGBA1C 5.2   CBG: Recent Labs  Lab 03/05/21 0213 03/05/21 1029 03/05/21 1138  GLUCAP 102* 122* 108*   Lipid Profile: No results for input(s): CHOL, HDL, LDLCALC, TRIG, CHOLHDL, LDLDIRECT in the last 72 hours. Thyroid Function Tests: No results for input(s): TSH, T4TOTAL, FREET4, T3FREE, THYROIDAB in the last 72 hours. Anemia Panel: No results for  input(s): VITAMINB12, FOLATE, FERRITIN, TIBC, IRON, RETICCTPCT in the last 72 hours.    Radiology Studies: I have reviewed all of the imaging during this hospital visit personally     Scheduled Meds:  allopurinol  200 mg Oral QHS   amLODipine  5 mg Oral Daily   aspirin  81 mg Oral Daily   docusate sodium  100 mg Oral BID   hydrALAZINE  50 mg Oral TID   insulin aspart  0-15 Units Subcutaneous TID AC & HS   insulin aspart  10 Units Subcutaneous TID AC   insulin glargine-yfgn  24 Units Subcutaneous QHS   metoprolol tartrate  50 mg Oral BID   multivitamin with minerals  1 tablet Oral Daily   pantoprazole  40 mg Oral Daily   rosuvastatin  10 mg Oral Daily   senna  1 tablet Oral Daily   vancomycin variable dose per unstable renal function (pharmacist dosing)   Does not apply See admin instructions   zinc sulfate  220 mg Oral Daily   Continuous Infusions:  sodium chloride 75 mL/hr at 03/05/21 0200   cefTRIAXone (ROCEPHIN)  IV Stopped (03/04/21 2121)     LOS: 1 day         Gabrien Mentink Annett Gula, MD

## 2021-03-05 NOTE — ED Notes (Signed)
Updated pt's wife about bed assignment

## 2021-03-05 NOTE — Progress Notes (Addendum)
Initial Nutrition Assessment  DOCUMENTATION CODES:   Obesity unspecified  INTERVENTION:   -Magic cup TID with meals, each supplement provides 290 kcal and 9 grams of protein  -MVI with minerals daily -Feeding assistance with meals -Change diet order to "with assist" secondary to visual impairment  NUTRITION DIAGNOSIS:   Increased nutrient needs related to wound healing as evidenced by estimated needs.  GOAL:   Patient will meet greater than or equal to 90% of their needs  MONITOR:   PO intake, Supplement acceptance, Labs, Weight trends, Skin, I & O's  REASON FOR ASSESSMENT:   Consult Assessment of nutrition requirement/status, Poor PO  ASSESSMENT:   87 year old male with past medical history of chronic kidney disease stage IIIb, insulin-dependent diabetes mellitus type 2, hypertension, aortic stenosis status post aortic valve replacement (05/2013), coronary artery disease status post CABG (2 vessel, 2014), gastroesophageal reflux disease, gout, macular degeneration, hyperlipidemia who presents to [facility] emergency department due to concerns for progressive weakness.  Pt admitted with with osteomyelitis of rt great toe.   Reviewed I/O's: +1 L x 24 hours  Spoke with pt at bedside, who reports he is "starving" as he has not eaten since 5 AM yesterday. He reports he generally has a good appetite and consumes 2 meals per day (Breakfast: ham sandwich or ham croissant; Dinner: soup or chik fila A sandwich). He denies any issue chewing or swallowing. Of note, pt with visual impairment and will need assistance with tray set up/ feeding and ordering meals.   Reviewed wt hx; wt has been stable over the past few years. Pt denies weight loss.   Pt shares that toe wound is chronic and has been followed by podiatry as an outpatient for management. Pt shares that he was prescribed "prescription pills" for management, but was unable to offer further details, but he did not  perceive that these interventions were helpful. He denies changes in his mobility, falls, or pain in his toe.   Discussed importance of good meal and supplement intake to promote healing.   Medications reviewed and include colace, senokot, zinc sulfate, and 0.9% sodium chloride infusion @ 75 ml/hr.   Lab Results  Component Value Date   HGBA1C 5.2 03/05/2021   PTA DM medications are 10-16 units insulin lispro TID and 24 units insulin glargine daily.    Labs reviewed: CBGS: 102 (inpatient orders for glycemic control are 0-15 units insulin aspart TID with meals and at bedtime, 10 units insulin aspart TID before meals, and 24 units insulin glargine-yfgn daily at bedtime).    NUTRITION - FOCUSED PHYSICAL EXAM:  Flowsheet Row Most Recent Value  Orbital Region No depletion  Upper Arm Region No depletion  Thoracic and Lumbar Region No depletion  Buccal Region No depletion  Temple Region No depletion  Clavicle Bone Region No depletion  Clavicle and Acromion Bone Region No depletion  Scapular Bone Region No depletion  Dorsal Hand No depletion  Patellar Region No depletion  Anterior Thigh Region No depletion  Posterior Calf Region No depletion  Edema (RD Assessment) None  Hair Reviewed  Eyes Reviewed  Mouth Reviewed  Skin Reviewed  Nails Reviewed       Diet Order:   Diet Order             Diet heart healthy/carb modified Room service appropriate? Yes; Fluid consistency: Thin  Diet effective now                   EDUCATION NEEDS:  Education needs have been addressed  Skin:  Skin Assessment: Skin Integrity Issues: Skin Integrity Issues:: Diabetic Ulcer Diabetic Ulcer: full thickness, neuropathic rt great toe ulcer  Last BM:  Unknown  Height:   Ht Readings from Last 1 Encounters:  03/04/21 5\' 8"  (1.727 m)    Weight:   Wt Readings from Last 1 Encounters:  03/04/21 92.5 kg    Ideal Body Weight:  70 kg  BMI:  Body mass index is 31.02 kg/m.  Estimated  Nutritional Needs:   Kcal:  1900-2100  Protein:  105-120 grams  Fluid:  > 1.9 L    03/06/21, RD, LDN, CDCES Registered Dietitian II Certified Diabetes Care and Education Specialist Please refer to [facility] for RD and/or RD on-call/weekend/after hours pager

## 2021-03-05 NOTE — Consult Note (Signed)
WOC Nurse Consult Note: Reason for Consult:Right great toe, full thickness wound. Patient is established with podiatric medicine. Wound type:neuropathic Pressure Injury POA: N/A Measurement:To be obtained by bedside RN and documented on Nursing flow sheet Wound bed:red, dry Drainage (amount, consistency, odor) none Periwound: intact Dressing procedure/placement/frequency:I have implemented a conservative POC using a soap and water cleanse, thorough dry and placement of an antimicrobial nonadherent dressing (xeroform gauze).This is to be covered with dry gauze and secured with conform bandaging/paper tape. Heels are to be floated for PI prevention. A sacral foam is ordered for PI prevention. I communicated my recommendation for patient to have a podiatry consult as the patient has been followed by them in the past. This can occur either as an inpatient or post discharge as an outpatient.  WOC nursing team will not follow, but will remain available to this patient, the nursing and medical teams.  Please re-consult if needed. Thanks, Ladona Mow, MSN, RN, GNP, [facility] [place]  Pager# [phone]

## 2021-03-05 NOTE — Progress Notes (Signed)
Physical Therapy Evaluation Patient Details Name: Johnny [name] MRN: [id number] DOB: Oct 31, 1932 Today's Date: 03/05/2021   History of Present Illness  Pt is a 87yo male presenting to [facility] ED on 8/24 with complaints of generalized weakness and R great toe ulceration. Imaging showed possible osteomyelitis. PMH: HTN, gout, CAD, acute renal failure, DM, MRSA.   Clinical Impression  Pt presents with the impairments above and problems listed below. Pt required mod assist for bed mobility and max assist for transfers. Pt is HoH and grossly blind and required max multimodal cuing for sequencing and reassurance during mobility tasks. Educated pt on the importance of movement to address weakness. Currently recommending SNF-level therapies upon discharge to address current limitations. We will continue to follow him acutely to promote independence with functional mobility.    Follow Up Recommendations SNF    Equipment Recommendations  Rolling walker with 5" wheels;Wheelchair (measurements PT);Wheelchair cushion (measurements PT)    Recommendations for Other Services       Precautions / Restrictions Precautions Precautions: Fall Precaution Comments: Pt fell out of bed prior to admission. Restrictions Weight Bearing Restrictions: No      Mobility  Bed Mobility Overal bed mobility: Needs Assistance Bed Mobility: Supine to Sit;Sit to Supine     Supine to sit: Mod assist Sit to supine: Mod assist   General bed mobility comments: Pt required mod assist for trunk control and LE advancement off the bed. Max multimodal cues for sequencing, body placement, and laterality of movement. Pt required 1-hand-held assist from PT for entirity of bed mobility task secondary to fear.    Transfers Overall transfer level: Needs assistance Equipment used: Rolling walker (2 wheeled) Transfers: Sit to/from Stand Sit to Stand: Max assist         General transfer comment: Pt required max assist for  transfers for powering up to standing and min-mod assist to maintain standing position as he demonstrated a moderate posterior lean with associated posterior pelvic tilt. Max verbal cuing for hand placement and sequencing. PT attempted to use tactile cuing for upright posturing but pt unable to acheive full upright stance and pt returned to sitting for safety.  Ambulation/Gait                Stairs            Wheelchair Mobility    Modified Rankin (Stroke Patients Only)       Balance Overall balance assessment: Needs assistance Sitting-balance support: Feet supported;Single extremity supported Sitting balance-Leahy Scale: Poor     Standing balance support: Bilateral upper extremity supported Standing balance-Leahy Scale: Zero Standing balance comment: Pt reliant on BUE on RW as well as mod-max assist from PT to maintain standing balance. Pt unable to acheive full upright standing posture                             Pertinent Vitals/Pain Pain Assessment: No/denies pain    Home Living Family/patient expects to be discharged to:: Private residence Living Arrangements: Spouse/significant other Available Help at Discharge: Family;Available 24 hours/day Type of Home: House Home Access: Stairs to enter Entrance Stairs-Rails: None Entrance Stairs-Number of Steps: 1 Home Layout: One level Home Equipment: Grab bars - tub/shower;Walker - 4 wheels;Shower seat;Hand held shower head      Prior Function Level of Independence: Needs assistance   Gait / Transfers Assistance Needed: Uses rollator for ambulation.  ADL's / Homemaking Assistance Needed: Wife bathes him, cooks for  him, provides some assistance with dressing.        Hand Dominance   Dominant Hand: Right    Extremity/Trunk Assessment   Upper Extremity Assessment Upper Extremity Assessment: Generalized weakness;RUE deficits/detail;LUE deficits/detail RUE Deficits / Details: Pt prefers to keep  right hand gripping on bed rail at all times, and grips PT hand during mobility tasks, likely secondary to sensory impairments. Gross strength 3+/5, reduced functional ROM such that he cannot reach behind his head. RUE Sensation: WNL LUE Deficits / Details: Pt's LUE demonstrated reduced functional ROM compared to RUE; pt unable to flex shoulder past ~20deg though does demonstrate nearly full elbow flexion. Wrist flexion WFL though reduced. Strength grossly 3/5. LUE Sensation: WNL    Lower Extremity Assessment Lower Extremity Assessment: Generalized weakness;RLE deficits/detail;LLE deficits/detail RLE Deficits / Details: Strength grossly 3+/5. Pt demonstrated reduced ROM especially of hip flexion. Foot inspected and great toe wound appeared dry with some red granulation tissue; no bandange. Skin of ankle and foot very dry and cracking. RLE Sensation: WNL LLE Deficits / Details: Strength grossly 3+/5. Reduced ROM especially of hip and ankle. Skin of ankle and foot very dry and cracking. LLE Sensation: WNL    Cervical / Trunk Assessment Cervical / Trunk Assessment: Kyphotic  Communication   Communication: HOH (Seems to hear best out of his right ear.)  Cognition Arousal/Alertness: Awake/alert Behavior During Therapy: Agitated;WFL for tasks assessed/performed Overall Cognitive Status: No family/caregiver present to determine baseline cognitive functioning                                 General Comments: Pt is HoH and grossly blind and demonstrated some generalized fear, reporting "I don't trust the nurses." Agitated at one point as pt reports PT talking to loud.      General Comments      Exercises     Assessment/Plan    PT Assessment Patient needs continued PT services  PT Problem List Decreased strength;Decreased range of motion;Decreased activity tolerance;Decreased balance;Decreased mobility;Decreased coordination;Decreased cognition;Decreased knowledge of use of  DME;Decreased safety awareness       PT Treatment Interventions DME instruction;Gait training;Stair training;Functional mobility training;Therapeutic activities;Therapeutic exercise;Neuromuscular re-education;Balance training;Cognitive remediation;Patient/family education    PT Goals (Current goals can be found in the Care Plan section)  Acute Rehab PT Goals Patient Stated Goal: to get stronger PT Goal Formulation: With patient Time For Goal Achievement: 03/19/21 Potential to Achieve Goals: Good    Frequency Min 3X/week   Barriers to discharge        Co-evaluation               AM-PAC PT "6 Clicks" Mobility  Outcome Measure Help needed turning from your back to your side while in a flat bed without using bedrails?: A Little Help needed moving from lying on your back to sitting on the side of a flat bed without using bedrails?: A Lot Help needed moving to and from a bed to a chair (including a wheelchair)?: A Lot Help needed standing up from a chair using your arms (e.g., wheelchair or bedside chair)?: A Lot Help needed to walk in hospital room?: A Lot Help needed climbing 3-5 steps with a railing? : Total 6 Click Score: 12    End of Session   Activity Tolerance: Patient tolerated treatment well Patient left: in bed;with bed alarm set;with call bell/phone within reach Nurse Communication: Mobility status PT Visit Diagnosis: Muscle weakness (generalized) (  M62.81);History of falling (Z91.81);Unsteadiness on feet (R26.81);Other abnormalities of gait and mobility (R26.89)    Time: 8811-0315 PT Time Calculation (min) (ACUTE ONLY): 33 min   Charges:   PT Evaluation $PT Eval Moderate Complexity: 1 Mod PT Treatments $Therapeutic Activity: 8-22 mins        Johnn Hai, SPT  Johnn Hai 03/05/2021, 12:41 PM

## 2021-03-05 NOTE — ED Notes (Signed)
Night Shift gave Report to floor

## 2021-03-06 DIAGNOSIS — M86671 Other chronic osteomyelitis, right ankle and foot: Secondary | ICD-10-CM

## 2021-03-06 LAB — CBC WITH DIFFERENTIAL/PLATELET
Abs Immature Granulocytes: 0.04 10*3/uL (ref 0.00–0.07)
Basophils Absolute: 0.1 10*3/uL (ref 0.0–0.1)
Basophils Relative: 1 %
Eosinophils Absolute: 0.5 10*3/uL (ref 0.0–0.5)
Eosinophils Relative: 4 %
HCT: 46.8 % (ref 39.0–52.0)
Hemoglobin: 15.7 g/dL (ref 13.0–17.0)
Immature Granulocytes: 0 %
Lymphocytes Relative: 19 %
Lymphs Abs: 2 10*3/uL (ref 0.7–4.0)
MCH: 29.5 pg (ref 26.0–34.0)
MCHC: 33.5 g/dL (ref 30.0–36.0)
MCV: 87.8 fL (ref 80.0–100.0)
Monocytes Absolute: 1.1 10*3/uL — ABNORMAL HIGH (ref 0.1–1.0)
Monocytes Relative: 11 %
Neutro Abs: 6.6 10*3/uL (ref 1.7–7.7)
Neutrophils Relative %: 65 %
Platelets: 224 10*3/uL (ref 150–400)
RBC: 5.33 MIL/uL (ref 4.22–5.81)
RDW: 13.9 % (ref 11.5–15.5)
WBC: 10.2 10*3/uL (ref 4.0–10.5)
nRBC: 0 % (ref 0.0–0.2)

## 2021-03-06 LAB — BASIC METABOLIC PANEL
Anion gap: 8 (ref 5–15)
BUN: 26 mg/dL — ABNORMAL HIGH (ref 8–23)
CO2: 23 mmol/L (ref 22–32)
Calcium: 9.8 mg/dL (ref 8.9–10.3)
Chloride: 107 mmol/L (ref 98–111)
Creatinine, Ser: 2.09 mg/dL — ABNORMAL HIGH (ref 0.61–1.24)
GFR, Estimated: 30 mL/min — ABNORMAL LOW (ref >60)
Glucose, Bld: 127 mg/dL — ABNORMAL HIGH (ref 70–99)
Potassium: 3.9 mmol/L (ref 3.5–5.1)
Sodium: 138 mmol/L (ref 135–145)

## 2021-03-06 LAB — GLUCOSE, CAPILLARY
Glucose-Capillary: 108 mg/dL — ABNORMAL HIGH (ref 70–99)
Glucose-Capillary: 92 mg/dL (ref 70–99)
Glucose-Capillary: 159 mg/dL — ABNORMAL HIGH (ref 70–99)
Glucose-Capillary: 189 mg/dL — ABNORMAL HIGH (ref 70–99)

## 2021-03-06 LAB — SURGICAL PCR SCREEN
MRSA, PCR: NEGATIVE
Staphylococcus aureus: NEGATIVE

## 2021-03-06 MED ORDER — INSULIN ASPART 100 UNIT/ML IJ SOLN
0.0000 [IU] | Freq: Three times a day (TID) | INTRAMUSCULAR | Status: DC
  Administered 2021-03-06 – 2021-03-07 (×3): 2 [IU] via SUBCUTANEOUS
  Administered 2021-03-08: 5 [IU] via SUBCUTANEOUS
  Administered 2021-03-08 (×2): 3 [IU] via SUBCUTANEOUS
  Administered 2021-03-09: 5 [IU] via SUBCUTANEOUS
  Administered 2021-03-09 – 2021-03-10 (×5): 3 [IU] via SUBCUTANEOUS
  Administered 2021-03-11: 2 [IU] via SUBCUTANEOUS
  Administered 2021-03-11: 3 [IU] via SUBCUTANEOUS

## 2021-03-06 MED ORDER — AMLODIPINE BESYLATE 5 MG PO TABS
5.0000 mg | ORAL_TABLET | Freq: Once | ORAL | Status: AC
  Administered 2021-03-06: 5 mg via ORAL
  Filled 2021-03-06: qty 1

## 2021-03-06 MED ORDER — INSULIN GLARGINE-YFGN 100 UNIT/ML ~~LOC~~ SOLN
6.0000 [IU] | Freq: Every day | SUBCUTANEOUS | Status: DC
  Administered 2021-03-06 – 2021-03-08 (×3): 6 [IU] via SUBCUTANEOUS
  Filled 2021-03-06 (×5): qty 0.06

## 2021-03-06 MED ORDER — AMLODIPINE BESYLATE 10 MG PO TABS
10.0000 mg | ORAL_TABLET | Freq: Every day | ORAL | Status: DC
  Administered 2021-03-07 – 2021-03-11 (×5): 10 mg via ORAL
  Filled 2021-03-06 (×5): qty 1

## 2021-03-06 NOTE — Progress Notes (Signed)
Patient placed on tele per orders.

## 2021-03-06 NOTE — Progress Notes (Signed)
Spoke with patient's wife via telephone this evening. Patient ate at 3pm. Surgery re-scheduled for tomorrow, 03/07/2021, 730AM. Preoperative orders placed. NPO after midnight.   Felecia Shelling, DPM [facility]  Dr. Felecia Shelling, DPM    [address] [place], [place] [address]                Office [phone]  Fax [phone]

## 2021-03-06 NOTE — Progress Notes (Signed)
PROGRESS NOTE    Johnny [name]  UXN:[id number] DOB: Dec 24, 1932 DOA: 03/04/2021 PCP: Merri Brunette, MD    Brief Narrative:  Johnny [name] was admitted to the hospital with the working diagnosis of right foot great toe osteomyelitis.    87 yo male with the past medical history of chronic renal disease stage IIIb, type 2 diabetes mellitus, hypertension, aortic stenosis status post valve replacement 2014, coronary artery disease status post bypass, GERD, gout and dyslipidemia who presented with progressive weakness.  Reported right foot great toe ulceration for about a month, associated with progressive edema and erythema.  7 days leading to his hospitalization he was noticed to have progressive weakness, generalized, associated with poor appetite.  His wound became purulent and he was brought to the hospital.  On his initial physical examination blood pressure 182/80, heart rate 72, respiratory rate 18, temperature 98.4, oxygen saturation 92%, his lungs are clear to auscultation bilaterally, heart S1-S2, present, rhythmic, soft abdomen, right foot first toe ulcerated wound.   Sodium 135 potassium 4.2, chloride 100, bicarb 26, glucose 183, BUN 26, creatinine 2.41, white count 10.4, hemoglobin 15.4, hematocrit 47.4 platelets 239. SARS COVID-19 negative.   Urinalysis specific gravity 1.008, negative nitrates.  100 protein.   Right foot radiograph with soft tissue edema and soft tissue defect consistent with ulceration.  Particular cortical erosion in the interphalangeal joint may represent osteomyelitis.   EKG 57 bpm, left axis deviation, first-degree AV block, interventricular conduction delay, sinus rhythm, no significant ST segment T wave changes, positive LVH.  Patient has been placed on IV broad spectrum antibiotic therapy. Podiatry consultation has recommend amputation during this hospitalization.    Assessment & Plan:   Principal Problem:   Osteomyelitis of great toe of right foot  (HCC) Active Problems:   Essential hypertension   Gout   GERD without esophagitis   Coronary artery disease involving native heart without angina pectoris   Acute renal failure superimposed on stage 3b chronic kidney disease (HCC)   Type 2 diabetes mellitus with stage 3b chronic kidney disease, with long-term current use of insulin (HCC)   Mixed diabetic hyperlipidemia associated with type 2 diabetes mellitus (HCC)   Right foot first toe proximal and distal phalanges osteomyelitis.   Patient has been afebrile, wbc is 10.2 and cultures continue with no growth.   Plan to continue with antibiotic therapy with ceftriaxone and vancomycin.  Pain control with as needed hydromorphone and oxycodone. Plan for surgical intervention during this hospitalization, he has a NPO order for Saturday.   2. T2DM/ dyslipidemia  Fasting glucose is 127, capillary 108 and 92. Plan to decrease basal insulin to 6 units, to prevent hypoglycemia, he will be NPO after midnight for surgical procedure.  Change insulin sliding scale to sensitive.   Continue with insulin sliding scale.  On rosuvastatin.    3. HTN. CAD  Uncontrolled blood pressure, plan to increase amlodipine to 10 mg daily and continu with hydralazine and metoprolol.    4. AKI on CKD stage 3b  Renal function today with serum cr at 2,0 with K at 3,9 and serum bicarbonate at 23. Base cr around 2.  Continue close follow up on renal function and electrolytes, avoid hypotension and nephrotoxic medications.    5. GERD continue with PPI     6. Gout. No signs of acute flare, on allopurionol   Patient continue to be at high risk for worsening osteomyelitis.   Status is: Inpatient  Remains inpatient appropriate because:IV treatments appropriate due  to intensity of illness or inability to take PO  Dispo: The patient is from: Home              Anticipated d/c is to: Home              Patient currently is not medically stable to d/c.   Difficult to  place patient No   DVT prophylaxis: Enoxaparin   Code Status:   full  Family Communication:  I spoke with patient's daughter at the bedside, we talked in detail about patient's condition, plan of care and prognosis and all questions were addressed.      Nutrition Status: Nutrition Problem: Increased nutrient needs Etiology: wound healing Signs/Symptoms: estimated needs Interventions: Magic cup, MVI    Consultants:  Podiatry    Antimicrobials:  Ceftriaxone and vancomycin     Subjective: Patient with no nausea or vomiting, no chest pain or dyspnea. No significant foot pain.   Objective: Vitals:   03/05/21 1100 03/05/21 1500 03/06/21 0750 03/06/21 1359  BP: (!) 154/79 (!) 187/76 (!) 200/87 (!) 190/74  Pulse: 77 71 75 (!) 59  Resp: 20 17 16 14   Temp: 98.3 F (36.8 C) 98.6 F (37 C) 97.8 F (36.6 C) 98.3 F (36.8 C)  TempSrc: Oral Oral Oral Oral  SpO2: 99% 97% 98% 100%  Weight: 85.7 kg     Height:        Intake/Output Summary (Last 24 hours) at 03/06/2021 1459 Last data filed at 03/05/2021 1800 Gross per 24 hour  Intake 563.33 ml  Output 400 ml  Net 163.33 ml   Filed Weights   03/04/21 1924 03/05/21 1100  Weight: 92.5 kg 85.7 kg    Examination:   General: Not in pain or dyspnea, deconditioned  Neurology: Awake and alert, non focal  E ENT: no pallor, no icterus, oral mucosa moist Cardiovascular: No JVD. S1-S2 present, rhythmic, no gallops, rubs, or murmurs. No lower extremity edema. Pulmonary: vesicular breath sounds bilaterally, adequate air movement, no wheezing, rhonchi or rales. Gastrointestinal. Abdomen soft and non tender Skin. First toe left foot with ulcerated wound in place with local edema.  Musculoskeletal: no joint deformities     Data Reviewed: I have personally reviewed following labs and imaging studies  CBC: Recent Labs  Lab 03/04/21 0745 03/05/21 0152 03/06/21 0555  WBC 10.4 11.1* 10.2  NEUTROABS  --  7.4 6.6  HGB 15.4 15.8  15.7  HCT 47.4 48.9 46.8  MCV 89.3 89.4 87.8  PLT 239 242 224   Basic Metabolic Panel: Recent Labs  Lab 03/04/21 0745 03/05/21 0152 03/06/21 0335  NA 135 138 138  K 4.2 4.2 3.9  CL 100 104 107  CO2 26 24 23   GLUCOSE 183* 111* 127*  BUN 26* 22 26*  CREATININE 2.41* 1.95* 2.09*  CALCIUM 9.9 10.3 9.8  MG  --  2.1  --    GFR: Estimated Creatinine Clearance: 27 mL/min (A) (by C-G formula based on SCr of 2.09 mg/dL (H)). Liver Function Tests: Recent Labs  Lab 03/05/21 0152  AST 31  31  ALT 25  26  ALKPHOS 62  62  BILITOT 1.0  1.0  PROT 6.9  7.0  ALBUMIN 3.3*  3.4*   No results for input(s): LIPASE, AMYLASE in the last 168 hours. No results for input(s): AMMONIA in the last 168 hours. Coagulation Profile: No results for input(s): INR, PROTIME in the last 168 hours. Cardiac Enzymes: No results for input(s): CKTOTAL, CKMB, CKMBINDEX, TROPONINI in  the last 168 hours. BNP (last 3 results) No results for input(s): PROBNP in the last 8760 hours. HbA1C: Recent Labs    03/05/21 0152  HGBA1C 5.2   CBG: Recent Labs  Lab 03/05/21 1138 03/05/21 1552 03/05/21 2115 03/06/21 0819 03/06/21 1253  GLUCAP 108* 81 167* 108* 92   Lipid Profile: No results for input(s): CHOL, HDL, LDLCALC, TRIG, CHOLHDL, LDLDIRECT in the last 72 hours. Thyroid Function Tests: No results for input(s): TSH, T4TOTAL, FREET4, T3FREE, THYROIDAB in the last 72 hours. Anemia Panel: No results for input(s): VITAMINB12, FOLATE, FERRITIN, TIBC, IRON, RETICCTPCT in the last 72 hours.    Radiology Studies: I have reviewed all of the imaging during this hospital visit personally     Scheduled Meds:  allopurinol  200 mg Oral QHS   [START ON 03/07/2021] amLODipine  10 mg Oral Daily   amLODipine  5 mg Oral Once   aspirin  81 mg Oral Daily   docusate sodium  100 mg Oral BID   hydrALAZINE  50 mg Oral TID   insulin aspart  0-15 Units Subcutaneous TID AC & HS   insulin glargine-yfgn  12 Units  Subcutaneous QHS   metoprolol tartrate  50 mg Oral BID   multivitamin with minerals  1 tablet Oral Daily   pantoprazole  40 mg Oral Daily   rosuvastatin  10 mg Oral Daily   senna  1 tablet Oral Daily   zinc sulfate  220 mg Oral Daily   Continuous Infusions:  cefTRIAXone (ROCEPHIN)  IV 2 g (03/05/21 2032)   vancomycin 750 mg (03/05/21 2210)     LOS: 2 days        Markala Sitts Annett Gula, MD

## 2021-03-06 NOTE — H&P (View-Only) (Signed)
Spoke with patient's wife via telephone this evening. Patient ate at 3pm. Surgery re-scheduled for tomorrow, 03/07/2021, 730AM. Preoperative orders placed. NPO after midnight.   Jalisia Puchalski M. Story Conti, DPM [facility]  Dr. Emmajo Bennette M. Renarda Mullinix, DPM    [address]                                    [place], [place] [address]                Office [phone]  Fax [phone]    

## 2021-03-06 NOTE — TOC Progression Note (Signed)
Transition of Care [facility]) - Progression Note    Patient Details  Name: Johnny [name] MRN: [id number] Date of Birth: February 02, 1933  [facility] of [facility]) CM/SW Contact  Ralene Bathe, LCSWA Phone Number: 03/06/2021, 2:42 PM  Clinical Narrative:      TOC following patient for any d/c planning needs once medically stable.  Cleon Gustin, MSW, LCSWA       Expected Discharge Plan and Services                                                 Social Determinants of Health (SDOH) Interventions    Readmission Risk Interventions No flowsheet data found.

## 2021-03-06 NOTE — Progress Notes (Signed)
Per patient and handoff this am patient was to remain NPO for potential OR today. Secure chat sent to MD Arrien to discuss since no NPO order active until tonight at midnight (8/27 at 0000). No surgery scheduled via Epic at this time. Per MD via secure chat patient ok to eat, lunch was ordered at this time.

## 2021-03-06 NOTE — Care Management Important Message (Signed)
Important Message  Patient Details  Name: Johnny [name] MRN: [id number] Date of Birth: 1932-11-06   Medicare Important Message Given:  Yes     Dorena Bodo 03/06/2021, 4:33 PM

## 2021-03-07 ENCOUNTER — Inpatient Hospital Stay (HOSPITAL_COMMUNITY): Payer: Medicare Other | Admitting: Anesthesiology

## 2021-03-07 ENCOUNTER — Inpatient Hospital Stay (HOSPITAL_COMMUNITY): Payer: Medicare Other

## 2021-03-07 ENCOUNTER — Encounter (HOSPITAL_COMMUNITY): Payer: Self-pay | Admitting: Internal Medicine

## 2021-03-07 ENCOUNTER — Encounter (HOSPITAL_COMMUNITY)
Admission: EM | Disposition: A | Discharge status: Skilled Nursing Facility | Payer: Self-pay | Source: Home / Self Care | Attending: Internal Medicine

## 2021-03-07 DIAGNOSIS — M86671 Other chronic osteomyelitis, right ankle and foot: Secondary | ICD-10-CM

## 2021-03-07 HISTORY — PX: AMPUTATION: SHX166

## 2021-03-07 LAB — BASIC METABOLIC PANEL
Anion gap: 8 (ref 5–15)
BUN: 27 mg/dL — ABNORMAL HIGH (ref 8–23)
CO2: 26 mmol/L (ref 22–32)
Calcium: 10 mg/dL (ref 8.9–10.3)
Chloride: 104 mmol/L (ref 98–111)
Creatinine, Ser: 1.91 mg/dL — ABNORMAL HIGH (ref 0.61–1.24)
GFR, Estimated: 34 mL/min — ABNORMAL LOW (ref >60)
Glucose, Bld: 178 mg/dL — ABNORMAL HIGH (ref 70–99)
Potassium: 3.9 mmol/L (ref 3.5–5.1)
Sodium: 138 mmol/L (ref 135–145)

## 2021-03-07 LAB — CBC
HCT: 46.9 % (ref 39.0–52.0)
Hemoglobin: 15.7 g/dL (ref 13.0–17.0)
MCH: 29.1 pg (ref 26.0–34.0)
MCHC: 33.5 g/dL (ref 30.0–36.0)
MCV: 87 fL (ref 80.0–100.0)
Platelets: 240 10*3/uL (ref 150–400)
RBC: 5.39 MIL/uL (ref 4.22–5.81)
RDW: 13.8 % (ref 11.5–15.5)
WBC: 9.5 10*3/uL (ref 4.0–10.5)
nRBC: 0 % (ref 0.0–0.2)

## 2021-03-07 LAB — GLUCOSE, CAPILLARY
Glucose-Capillary: 162 mg/dL — ABNORMAL HIGH (ref 70–99)
Glucose-Capillary: 153 mg/dL — ABNORMAL HIGH (ref 70–99)
Glucose-Capillary: 198 mg/dL — ABNORMAL HIGH (ref 70–99)
Glucose-Capillary: 169 mg/dL — ABNORMAL HIGH (ref 70–99)
Glucose-Capillary: 158 mg/dL — ABNORMAL HIGH (ref 70–99)

## 2021-03-07 SURGERY — AMPUTATION DIGIT
Anesthesia: Monitor Anesthesia Care | Laterality: Right

## 2021-03-07 MED ORDER — LIDOCAINE HCL 2 % IJ SOLN
INTRAMUSCULAR | Status: DC | PRN
  Administered 2021-03-07: 10 mL

## 2021-03-07 MED ORDER — LABETALOL HCL 5 MG/ML IV SOLN
10.0000 mg | Freq: Once | INTRAVENOUS | Status: AC
  Administered 2021-03-07: 10 mg via INTRAVENOUS

## 2021-03-07 MED ORDER — 0.9 % SODIUM CHLORIDE (POUR BTL) OPTIME
TOPICAL | Status: DC | PRN
  Administered 2021-03-07: 1000 mL

## 2021-03-07 MED ORDER — BUPIVACAINE HCL (PF) 0.5 % IJ SOLN
INTRAMUSCULAR | Status: AC
  Filled 2021-03-07: qty 30

## 2021-03-07 MED ORDER — MORPHINE SULFATE (PF) 2 MG/ML IV SOLN: 2.0000 mg | INTRAVENOUS | Status: DC | PRN

## 2021-03-07 MED ORDER — CHLORHEXIDINE GLUCONATE 0.12 % MT SOLN: 15.0000 mL | Freq: Once | OROMUCOSAL | Status: AC

## 2021-03-07 MED ORDER — LIDOCAINE HCL (PF) 1 % IJ SOLN
INTRAMUSCULAR | Status: AC
  Filled 2021-03-07: qty 30

## 2021-03-07 MED ORDER — PROPOFOL 10 MG/ML IV BOLUS
INTRAVENOUS | Status: DC | PRN
  Administered 2021-03-07 (×2): 20 mg via INTRAVENOUS
  Administered 2021-03-07: 30 mg via INTRAVENOUS

## 2021-03-07 MED ORDER — CHLORHEXIDINE GLUCONATE 0.12 % MT SOLN
OROMUCOSAL | Status: AC
  Administered 2021-03-07: 15 mL via OROMUCOSAL
  Filled 2021-03-07: qty 15

## 2021-03-07 MED ORDER — PROPOFOL 500 MG/50ML IV EMUL
INTRAVENOUS | Status: DC | PRN
  Administered 2021-03-07: 20 ug/kg/min via INTRAVENOUS

## 2021-03-07 MED ORDER — CHLORHEXIDINE GLUCONATE 0.12 % MT SOLN
OROMUCOSAL | Status: AC
  Filled 2021-03-07: qty 15

## 2021-03-07 MED ORDER — FENTANYL CITRATE (PF) 250 MCG/5ML IJ SOLN
INTRAMUSCULAR | Status: AC
  Filled 2021-03-07: qty 5

## 2021-03-07 MED ORDER — LIDOCAINE HCL 2 % IJ SOLN
INTRAMUSCULAR | Status: AC
  Filled 2021-03-07: qty 20

## 2021-03-07 MED ORDER — OXYCODONE HCL 5 MG PO TABS
5.0000 mg | ORAL_TABLET | ORAL | Status: DC | PRN
  Administered 2021-03-07 – 2021-03-09 (×5): 5 mg via ORAL
  Filled 2021-03-07 (×5): qty 1

## 2021-03-07 MED ORDER — SODIUM CHLORIDE 0.9 % IV SOLN: INTRAVENOUS | Status: DC

## 2021-03-07 MED ORDER — BUPIVACAINE HCL (PF) 0.5 % IJ SOLN
INTRAMUSCULAR | Status: DC | PRN
  Administered 2021-03-07: 10 mL

## 2021-03-07 MED ORDER — FENTANYL CITRATE (PF) 100 MCG/2ML IJ SOLN: 25.0000 ug | INTRAMUSCULAR | Status: DC | PRN

## 2021-03-07 SURGICAL SUPPLY — 41 items
APL PRP STRL LF DISP 70% ISPRP (MISCELLANEOUS)
BAG COUNTER SPONGE SURGICOUNT (BAG) ×2 IMPLANT
BAG SPNG CNTER NS LX DISP (BAG) ×1
BLADE SURG 15 STRL LF DISP TIS (BLADE) IMPLANT
BLADE SURG 15 STRL SS (BLADE)
BNDG ELASTIC 4X5.8 VLCR STR LF (GAUZE/BANDAGES/DRESSINGS) ×2 IMPLANT
BNDG GAUZE ELAST 4 BULKY (GAUZE/BANDAGES/DRESSINGS) ×2 IMPLANT
CHLORAPREP W/TINT 26 (MISCELLANEOUS) IMPLANT
COVER SURGICAL LIGHT HANDLE (MISCELLANEOUS) ×2 IMPLANT
CUFF TOURN SGL QUICK 18X4 (TOURNIQUET CUFF) ×2 IMPLANT
CUFF TOURN SGL QUICK 24 (TOURNIQUET CUFF)
CUFF TRNQT CYL 24X4X16.5-23 (TOURNIQUET CUFF) IMPLANT
DRSG PAD ABDOMINAL 8X10 ST (GAUZE/BANDAGES/DRESSINGS) ×2 IMPLANT
ELECT REM PT RETURN 9FT ADLT (ELECTROSURGICAL)
ELECTRODE REM PT RTRN 9FT ADLT (ELECTROSURGICAL) IMPLANT
GAUZE SPONGE 4X4 12PLY STRL (GAUZE/BANDAGES/DRESSINGS) ×2 IMPLANT
GAUZE SPONGE 4X4 12PLY STRL LF (GAUZE/BANDAGES/DRESSINGS) ×2 IMPLANT
GAUZE XEROFORM 1X8 LF (GAUZE/BANDAGES/DRESSINGS) ×2 IMPLANT
GLOVE SRG 8 PF TXTR STRL LF DI (GLOVE) ×1 IMPLANT
GLOVE SURG ENC MOIS LTX SZ8 (GLOVE) ×2 IMPLANT
GLOVE SURG UNDER POLY LF SZ8 (GLOVE) ×2
GOWN STRL REUS W/ TWL LRG LVL3 (GOWN DISPOSABLE) ×2 IMPLANT
GOWN STRL REUS W/TWL LRG LVL3 (GOWN DISPOSABLE) ×4
KIT BASIN OR (CUSTOM PROCEDURE TRAY) ×2 IMPLANT
KIT TURNOVER KIT B (KITS) ×2 IMPLANT
NEEDLE PRECISIONGLIDE 27X1.5 (NEEDLE) ×2 IMPLANT
NS IRRIG 1000ML POUR BTL (IV SOLUTION) ×2 IMPLANT
PACK ORTHO EXTREMITY (CUSTOM PROCEDURE TRAY) ×2 IMPLANT
PAD ARMBOARD 7.5X6 YLW CONV (MISCELLANEOUS) ×4 IMPLANT
PAD CAST 4YDX4 CTTN HI CHSV (CAST SUPPLIES) ×1 IMPLANT
PADDING CAST COTTON 4X4 STRL (CAST SUPPLIES) ×2
SOL PREP POV-IOD 4OZ 10% (MISCELLANEOUS) ×2 IMPLANT
STAPLER VISISTAT 35W (STAPLE) IMPLANT
SUT PROLENE 4 0 PS 2 18 (SUTURE) IMPLANT
SUT VIC AB 3-0 PS2 18 (SUTURE) IMPLANT
SUT VICRYL 4-0 PS2 18IN ABS (SUTURE) IMPLANT
SYR CONTROL 10ML LL (SYRINGE) ×2 IMPLANT
TOWEL GREEN STERILE (TOWEL DISPOSABLE) ×2 IMPLANT
TOWEL GREEN STERILE FF (TOWEL DISPOSABLE) ×2 IMPLANT
TUBE CONNECTING 12X1/4 (SUCTIONS) ×2 IMPLANT
YANKAUER SUCT BULB TIP NO VENT (SUCTIONS) IMPLANT

## 2021-03-07 NOTE — Anesthesia Preprocedure Evaluation (Addendum)
Anesthesia Evaluation  Patient identified by MRN, date of birth, ID band Patient awake    Reviewed: Allergy & Precautions, NPO status , Patient's Chart, lab work & pertinent test results  Airway Mallampati: II  TM Distance: >3 FB Neck ROM: Full    Dental  (+) Dental Advisory Given   Pulmonary former smoker,    breath sounds clear to auscultation       Cardiovascular hypertension, Pt. on medications + CAD   Rhythm:Regular Rate:Normal     Neuro/Psych CVA    GI/Hepatic Neg liver ROS, GERD  ,  Endo/Other  diabetes, Type 2, Insulin Dependent  Renal/GU Renal InsufficiencyRenal disease     Musculoskeletal   Abdominal   Peds  Hematology negative hematology ROS (+)   Anesthesia Other Findings   Reproductive/Obstetrics                            Anesthesia Physical Anesthesia Plan  ASA: 3  Anesthesia Plan: MAC   Post-op Pain Management:    Induction:   PONV Risk Score and Plan: 1 and Propofol infusion, Ondansetron and Treatment may vary due to age or medical condition  Airway Management Planned: Natural Airway and Simple Face Mask  Additional Equipment:   Intra-op Plan:   Post-operative Plan:   Informed Consent: I have reviewed the patients History and Physical, chart, labs and discussed the procedure including the risks, benefits and alternatives for the proposed anesthesia with the patient or authorized representative who has indicated his/her understanding and acceptance.       Plan Discussed with:   Anesthesia Plan Comments:        Anesthesia Quick Evaluation

## 2021-03-07 NOTE — Progress Notes (Signed)
Received patient from PACU awake, alert/orientedx4 and able to verbalize needs. VSS; NAD noted; respirations easy/even on room air. Nerve block in place to LLE; some sensation. Dressing to L foot c/d/I. Wife at bedside. Continuous pulse ox connected. All safety measures in place and personal belongings within reach.

## 2021-03-07 NOTE — Progress Notes (Signed)
PROGRESS NOTE    Johnny [name]  IHK:[id number] DOB: January 22, 1933 DOA: 03/04/2021 PCP: Johnny Brunette, MD    Brief Narrative:  Johnny [name] was admitted to the hospital with the working diagnosis of right foot great toe osteomyelitis.    87 yo male with the past medical history of chronic renal disease stage IIIb, type 2 diabetes mellitus, hypertension, aortic stenosis status post valve replacement 2014, coronary artery disease status post bypass, GERD, gout and dyslipidemia who presented with progressive weakness.  Reported right foot great toe ulceration for about a month, associated with progressive edema and erythema.  7 days leading to his hospitalization he was noticed to have progressive weakness, generalized, associated with poor appetite.  His wound became purulent and he was brought to the hospital.  On his initial physical examination blood pressure 182/80, heart rate 72, respiratory rate 18, temperature 98.4, oxygen saturation 92%, his lungs are clear to auscultation bilaterally, heart S1-S2, present, rhythmic, soft abdomen, right foot first toe ulcerated wound.   Sodium 135 potassium 4.2, chloride 100, bicarb 26, glucose 183, BUN 26, creatinine 2.41, white count 10.4, hemoglobin 15.4, hematocrit 47.4 platelets 239. SARS COVID-19 negative.   Urinalysis specific gravity 1.008, negative nitrates.  100 protein.   Right foot radiograph with soft tissue edema and soft tissue defect consistent with ulceration.  Particular cortical erosion in the interphalangeal joint may represent osteomyelitis.   EKG 57 bpm, left axis deviation, first-degree AV block, interventricular conduction delay, sinus rhythm, no significant ST segment T wave changes, positive LVH.   Patient has been placed on IV broad spectrum antibiotic therapy. Podiatry consultation has recommend amputation during this hospitalization.  08/27 amputation of great toe on the right.   Assessment & Plan:   Principal Problem:    Osteomyelitis of great toe of right foot (HCC) Active Problems:   Essential hypertension   Gout   GERD without esophagitis   Coronary artery disease involving native heart without angina pectoris   Acute renal failure superimposed on stage 3b chronic kidney disease (HCC)   Type 2 diabetes mellitus with stage 3b chronic kidney disease, with long-term current use of insulin (HCC)   Mixed diabetic hyperlipidemia associated with type 2 diabetes mellitus (HCC)       Right foot first toe proximal and distal phalanges osteomyelitis.   Sp amputation of 1st great toe on the right.     Continue with ceftriaxone. PCR screen for MRSA negative, will discontinue IV vancomycin for now.    Continue with as needed hydromorphone and oxycodone for pain control.  Follow up with PT and OT recommendations.    2. T2DM/ dyslipidemia  Fasting glucose is 178, capillary 162 and 153.   Continue basal insulin at a reduced dose to prevent hypoglycemia, continue with insulin sliding scale for glucose cover and monitoring.   Continue with rosuvastatin.    3. HTN. CAD  Blood pressure 169/83 mmHg this am, will continue with amlodipine, hydralazine and metoprolol for blood pressure control.    4. AKI on CKD stage 3b  Stable renal function with serum cr at 2,0 with K at 3,9 and serum bicarbonate at 26.  Continue blood pressure control. Avoid hypotension and nephrotoxic medications.    5. GERD On PPI     6. Gout. No signs of acute flare, continue with allopurionol  Status is: Inpatient  Remains inpatient appropriate because:Inpatient level of care appropriate due to severity of illness  Dispo: The patient is from: Home  Anticipated d/c is to: Home              Patient currently is not medically stable to d/c.   Difficult to place patient No   DVT prophylaxis: Enoxaparin   Code Status:   full  Family Communication:  I spoke with patient's wife at the bedside, we talked in detail about  patient's condition, plan of care and prognosis and all questions were addressed.      Nutrition Status: Nutrition Problem: Increased nutrient needs Etiology: wound healing Signs/Symptoms: estimated needs Interventions: Magic cup, MVI    Consultants:  Podiatry   Procedures:  Right foot first toe amputation   Antimicrobials:  Ceftriaxone IV     Subjective: Patient sp amputation today, with no significant pain at the surgical site, no nausea or vomiting, no chest pain or dyspnea.   Objective: Vitals:   03/07/21 0825 03/07/21 0839 03/07/21 0853 03/07/21 0915  BP: (!) 157/80 (!) 155/83 (!) 167/84 (!) 169/83  Pulse: 77 74 75 75  Resp: (!) 21 14 14 17   Temp: (!) 97.4 F (36.3 C)  (!) 97.4 F (36.3 C) 98.9 F (37.2 C)  TempSrc:      SpO2: 96% 93% 95% 97%  Weight:      Height:        Intake/Output Summary (Last 24 hours) at 03/07/2021 1109 Last data filed at 03/07/2021 0825 Gross per 24 hour  Intake 890 ml  Output 1225 ml  Net -335 ml   Filed Weights   03/04/21 1924 03/05/21 1100  Weight: 92.5 kg 85.7 kg    Examination:   General: Not in pain or dyspnea, deconditioned  Neurology: Awake and alert, non focal  E ENT: mild pallor, no icterus, oral mucosa moist Cardiovascular: No JVD. S1-S2 present, rhythmic, no gallops, rubs, or murmurs. No lower extremity edema. Pulmonary: positive breath sounds bilaterally, with no wheezing, rhonchi or rales. Gastrointestinal. Abdomen soft and non tender Skin. No rashes Musculoskeletal: no joint deformities     Data Reviewed: I have personally reviewed following labs and imaging studies  CBC: Recent Labs  Lab 03/04/21 0745 03/05/21 0152 03/06/21 0555 03/07/21 0009  WBC 10.4 11.1* 10.2 9.5  NEUTROABS  --  7.4 6.6  --   HGB 15.4 15.8 15.7 15.7  HCT 47.4 48.9 46.8 46.9  MCV 89.3 89.4 87.8 87.0  PLT 239 242 224 240   Basic Metabolic Panel: Recent Labs  Lab 03/04/21 0745 03/05/21 0152 03/06/21 0335 03/07/21 0009   NA 135 138 138 138  K 4.2 4.2 3.9 3.9  CL 100 104 107 104  CO2 26 24 23 26   GLUCOSE 183* 111* 127* 178*  BUN 26* 22 26* 27*  CREATININE 2.41* 1.95* 2.09* 1.91*  CALCIUM 9.9 10.3 9.8 10.0  MG  --  2.1  --   --    GFR: Estimated Creatinine Clearance: 29.6 mL/min (A) (by C-G formula based on SCr of 1.91 mg/dL (H)). Liver Function Tests: Recent Labs  Lab 03/05/21 0152  AST 31  31  ALT 25  26  ALKPHOS 62  62  BILITOT 1.0  1.0  PROT 6.9  7.0  ALBUMIN 3.3*  3.4*   No results for input(s): LIPASE, AMYLASE in the last 168 hours. No results for input(s): AMMONIA in the last 168 hours. Coagulation Profile: No results for input(s): INR, PROTIME in the last 168 hours. Cardiac Enzymes: No results for input(s): CKTOTAL, CKMB, CKMBINDEX, TROPONINI in the last 168 hours. BNP (last 3 results)  No results for input(s): PROBNP in the last 8760 hours. HbA1C: Recent Labs    03/05/21 0152  HGBA1C 5.2   CBG: Recent Labs  Lab 03/06/21 1253 03/06/21 1603 03/06/21 2106 03/07/21 0544 03/07/21 0823  GLUCAP 92 159* 189* 162* 153*   Lipid Profile: No results for input(s): CHOL, HDL, LDLCALC, TRIG, CHOLHDL, LDLDIRECT in the last 72 hours. Thyroid Function Tests: No results for input(s): TSH, T4TOTAL, FREET4, T3FREE, THYROIDAB in the last 72 hours. Anemia Panel: No results for input(s): VITAMINB12, FOLATE, FERRITIN, TIBC, IRON, RETICCTPCT in the last 72 hours.    Radiology Studies: I have reviewed all of the imaging during this hospital visit personally     Scheduled Meds:  allopurinol  200 mg Oral QHS   amLODipine  10 mg Oral Daily   aspirin  81 mg Oral Daily   chlorhexidine       docusate sodium  100 mg Oral BID   hydrALAZINE  50 mg Oral TID   insulin aspart  0-9 Units Subcutaneous TID WC   insulin glargine-yfgn  6 Units Subcutaneous QHS   metoprolol tartrate  50 mg Oral BID   multivitamin with minerals  1 tablet Oral Daily   pantoprazole  40 mg Oral Daily    rosuvastatin  10 mg Oral Daily   senna  1 tablet Oral Daily   zinc sulfate  220 mg Oral Daily   Continuous Infusions:  cefTRIAXone (ROCEPHIN)  IV Stopped (03/06/21 2133)   vancomycin Stopped (03/06/21 2256)     LOS: 3 days        Delton Stelle Annett Gula, MD

## 2021-03-07 NOTE — Interval H&P Note (Signed)
History and Physical Interval Note:  03/07/2021 8:18 AM  Johnny [name]  has presented today for surgery, with the diagnosis of Toe Infection.  The various methods of treatment have been discussed with the patient and family. After consideration of risks, benefits and other options for treatment, the patient has consented to  Procedure(s): AMPUTATION GREAT TOE (Right) as a surgical intervention.  The patient's history has been reviewed, patient examined, no change in status, stable for surgery.  I have reviewed the patient's chart and labs.  Questions were answered to the patient's satisfaction.     Felecia Shelling

## 2021-03-07 NOTE — Brief Op Note (Signed)
03/07/2021  8:18 AM  PATIENT:  Johnny [name]  87 y.o. male  PRE-OPERATIVE DIAGNOSIS:  Toe Infection  POST-OPERATIVE DIAGNOSIS:  Toe Infection  PROCEDURE:  Procedure(s): AMPUTATION GREAT TOE (Right)  SURGEON:  Surgeon(s) and Role:    Edrick Kins, DPM - Primary  PHYSICIAN ASSISTANT:   ASSISTANTS: none   ANESTHESIA:   local and MAC  EBL:  minimal   BLOOD ADMINISTERED:none  DRAINS: none   LOCAL MEDICATIONS USED:  MARCAINE    and LIDOCAINE   SPECIMEN:  Source of Specimen:  RT great toe  DISPOSITION OF SPECIMEN: Pathology  COUNTS:  YES  TOURNIQUET:   Total Tourniquet Time Documented: Calf (Right) - 16 minutes Total: Calf (Right) - 16 minutes   DICTATION: .Viviann Spare Dictation  PLAN OF CARE: Admit to inpatient   PATIENT DISPOSITION:  PACU - hemodynamically stable.   Delay start of Pharmacological VTE agent (>24hrs) due to surgical blood loss or risk of bleeding: no

## 2021-03-07 NOTE — Transfer of Care (Signed)
Immediate Anesthesia Transfer of Care Note  Patient: Johnny [name]  Procedure(s) Performed: AMPUTATION GREAT TOE (Right)  Patient Location: PACU  Anesthesia Type:General  Level of Consciousness: awake, alert  and oriented  Airway & Oxygen Therapy: Patient Spontanous Breathing  Post-op Assessment: Report given to RN and Post -op Vital signs reviewed and stable  Post vital signs: Reviewed and stable  Last Vitals:  Vitals Value Taken Time  BP 157/89   Temp    Pulse 77 03/07/21 0825  Resp 21 03/07/21 0825  SpO2 96 % 03/07/21 0825  Vitals shown include unvalidated device data.  Last Pain:  Vitals:   03/07/21 0709  TempSrc: Oral  PainSc: 0-No pain         Complications: No notable events documented.

## 2021-03-07 NOTE — Op Note (Signed)
OPERATIVE REPORT Patient name: Johnny [name] MRN: [id number] DOB: [birth year]  DOS: 03/07/2021  Preop Dx: Osteomyelitis right great toe Postop Dx: same  Procedure:  1.  Amputation right great toe  Surgeon: Felecia Shelling DPM  Anesthesia: 50-50 mixture of 2% lidocaine plain with 0.5% Marcaine plain totaling 20 mL infiltrated in the patient's right lower extremity in a hallux block fashion  Hemostasis: Ankle tourniquet inflated to a pressure of without esmarch exsanguination   EBL: Minimal mL Materials: None Injectables: None Pathology: None  Condition: The patient tolerated the procedure and anesthesia well. No complications noted or reported   Justification for procedure: The patient is a 87 y.o. male who presents today for surgical correction of osteomyelitis to the right great toe. All conservative modalities of been unsuccessful in providing any sort of satisfactory alleviation of symptoms with the patient. The patient was told benefits as well as possible side effects of the surgery. The patient consented for surgical correction. The patient consent form was reviewed. All patient questions were answered. No guarantees were expressed or implied. The patient and the surgeon both signed the patient consent form with the witness present and placed in the patient's chart.   Procedure in Detail: The patient was brought to the operating room, remained in the patient's hospital bed in the supine position at which time an aseptic scrub and drape were performed about the patient's respective lower extremity after anesthesia was induced as described above. Attention was then directed to the surgical area where procedure number one commenced.  Procedure #1: Amputation right great toe A fishmouth type incision was planned and made about the first MTPJ of the right great toe.  The incision was carried down to the level of bone and joint capsule using a surgical #15 blade.  The toe  was grasped with a perforating towel clamp and distracted distally Whalley capsulotomy was performed around the MTPJ.  The toe was removed in toto and placed in a sterile specimen container.  The extensor and flexor hallucis longus tendons were distracted distally and cut as far proximal as could be visualized.  Intraoperative evaluation indicated healthy viable tissue.  Copious irrigation was utilized in preparation for primary closure.  Primary closure was obtained using 3-0 Prolene suture and reinforced with skin staples.  Dry sterile compressive dressings were then applied to all previously mentioned incision sites about the patient's lower extremity. The tourniquet which was used for hemostasis was deflated. All normal neurovascular responses including pink color and warmth returned to the remaining digits of patient's lower extremity.  The patient was then transferred from the operating room to the recovery room having tolerated the procedure and anesthesia well. All vital signs are stable. After a brief stay in the recovery room the patient was readmitted to inpatient room with adequate prescriptions for analgesia. Verbal as well as written instructions were provided for the patient regarding wound care. The patient is to keep the dressings clean dry and intact until they are to follow surgeon Dr. Gala Lewandowsky in the office upon discharge.   Felecia Shelling, DPM [facility]  Dr. Felecia Shelling, DPM    [address] [place] [address]                Office [phone])  [phone]  Fax [phone]

## 2021-03-07 NOTE — Plan of Care (Signed)

## 2021-03-07 NOTE — Anesthesia Procedure Notes (Signed)
Procedure Name: MAC Date/Time: 03/07/2021 7:51 AM Performed by: Dorthea Cove, CRNA Pre-anesthesia Checklist: Patient identified, Emergency Drugs available, Suction available, Patient being monitored and Timeout performed Patient Re-evaluated:Patient Re-evaluated prior to induction Oxygen Delivery Method: Simple face mask Preoxygenation: Pre-oxygenation with 100% oxygen Induction Type: IV induction Placement Confirmation: positive ETCO2 Dental Injury: Teeth and Oropharynx as per pre-operative assessment

## 2021-03-07 NOTE — Anesthesia Postprocedure Evaluation (Signed)
Anesthesia Post Note  Patient: Johnny [name]  Procedure(s) Performed: AMPUTATION GREAT TOE (Right)     Patient location during evaluation: PACU Anesthesia Type: MAC Level of consciousness: awake and alert Pain management: pain level controlled Vital Signs Assessment: post-procedure vital signs reviewed and stable Respiratory status: spontaneous breathing, nonlabored ventilation, respiratory function stable and patient connected to nasal cannula oxygen Cardiovascular status: stable and blood pressure returned to baseline Postop Assessment: no apparent nausea or vomiting Anesthetic complications: no   No notable events documented.  Last Vitals:  Vitals:   03/07/21 0853 03/07/21 0915  BP: (!) 167/84 (!) 169/83  Pulse: 75 75  Resp: 14 17  Temp: (!) 36.3 C 37.2 C  SpO2: 95% 97%    Last Pain:  Vitals:   03/07/21 1244  TempSrc:   PainSc: 5                  Tiajuana Amass

## 2021-03-08 ENCOUNTER — Encounter (HOSPITAL_COMMUNITY): Payer: Self-pay | Admitting: Podiatry

## 2021-03-08 LAB — GLUCOSE, CAPILLARY
Glucose-Capillary: 239 mg/dL — ABNORMAL HIGH (ref 70–99)
Glucose-Capillary: 285 mg/dL — ABNORMAL HIGH (ref 70–99)
Glucose-Capillary: 223 mg/dL — ABNORMAL HIGH (ref 70–99)
Glucose-Capillary: 233 mg/dL — ABNORMAL HIGH (ref 70–99)

## 2021-03-08 MED ORDER — HYDRALAZINE HCL 50 MG PO TABS
75.0000 mg | ORAL_TABLET | Freq: Three times a day (TID) | ORAL | Status: DC
  Administered 2021-03-08 – 2021-03-09 (×5): 75 mg via ORAL
  Filled 2021-03-08 (×5): qty 1

## 2021-03-08 MED ORDER — HYDRALAZINE HCL 25 MG PO TABS
25.0000 mg | ORAL_TABLET | Freq: Once | ORAL | Status: AC
  Administered 2021-03-08: 25 mg via ORAL
  Filled 2021-03-08: qty 1

## 2021-03-08 MED ORDER — CEPHALEXIN 500 MG PO CAPS
500.0000 mg | ORAL_CAPSULE | Freq: Two times a day (BID) | ORAL | Status: DC
  Administered 2021-03-08 – 2021-03-11 (×7): 500 mg via ORAL
  Filled 2021-03-08 (×7): qty 1

## 2021-03-08 MED ORDER — HYDRALAZINE HCL 50 MG PO TABS: 50.0000 mg | ORAL_TABLET | Freq: Two times a day (BID) | ORAL | Status: DC

## 2021-03-08 NOTE — Progress Notes (Signed)
PROGRESS NOTE    Johnny [name]  JXB:[id number] DOB: 07-24-1932 DOA: 03/04/2021 PCP: Merri Brunette, MD    Brief Narrative:  Johnny [name] was admitted to the hospital with the working diagnosis of right foot great toe osteomyelitis.    87 yo male with the past medical history of chronic renal disease stage IIIb, type 2 diabetes mellitus, hypertension, aortic stenosis status post valve replacement 2014, coronary artery disease status post bypass, GERD, gout and dyslipidemia who presented with progressive weakness.  Reported right foot great toe ulceration for about a month, associated with progressive edema and erythema.  7 days leading to his hospitalization he was noticed to have progressive weakness, generalized, associated with poor appetite.  His wound became purulent and he was brought to the hospital.  On his initial physical examination blood pressure 182/80, heart rate 72, respiratory rate 18, temperature 98.4, oxygen saturation 92%, his lungs are clear to auscultation bilaterally, heart S1-S2, present, rhythmic, soft abdomen, right foot first toe ulcerated wound.   Sodium 135 potassium 4.2, chloride 100, bicarb 26, glucose 183, BUN 26, creatinine 2.41, white count 10.4, hemoglobin 15.4, hematocrit 47.4 platelets 239. SARS COVID-19 negative.   Urinalysis specific gravity 1.008, negative nitrates.  100 protein.   Right foot radiograph with soft tissue edema and soft tissue defect consistent with ulceration.  Particular cortical erosion in the interphalangeal joint may represent osteomyelitis.   EKG 57 bpm, left axis deviation, first-degree AV block, interventricular conduction delay, sinus rhythm, no significant ST segment T wave changes, positive LVH.   Patient has been placed on IV broad spectrum antibiotic therapy. Podiatry consultation has recommend amputation during this hospitalization.   08/27 amputation of great toe on the right.   Patient with uncontrolled hypertension not able  to do physical therapy.    Assessment & Plan:   Principal Problem:   Osteomyelitis of great toe of right foot (HCC) Active Problems:   Essential hypertension   Gout   GERD without esophagitis   Coronary artery disease involving native heart without angina pectoris   Acute renal failure superimposed on stage 3b chronic kidney disease (HCC)   Type 2 diabetes mellitus with stage 3b chronic kidney disease, with long-term current use of insulin (HCC)   Mixed diabetic hyperlipidemia associated with type 2 diabetes mellitus (HCC)     Right foot first toe proximal and distal phalanges osteomyelitis.   Sp amputation of 1st great toe on the right.     Plan to transition to oral antibiotic therapy with cephalexin for 7 days. Continue wound care and pain control (hydromorphone and oxycodone)    This am not able to complete PT evaluation due to uncontrolled hypertension and dizziness.    2. T2DM/ dyslipidemia  Uncontrolled diabetes mellitus with capillary glucose 239 mg/dl.   Continue insulin sliding scale for glucose cover and monitoring and basal insulin. If continue hyperglycemia, will increase basal dose  Patient is tolerating po well.    On rosuvastatin.    3. HTN (uncontrolled). CAD  Systolic blood pressure has been 213 and 193 mmHg. Will increase hydralazine to 75 mg tid and continue with metoprolol and amlodipine.    4. AKI on CKD stage 3b  His renal function has been stable, will follow basic metabolic panel in am.    5. GERD continue with PPI     6. Gout. No acute flare, continue with allopurionol   Patient continue to be at high risk for worsening hypertension   Status is: Inpatient  Remains  inpatient appropriate because:Inpatient level of care appropriate due to severity of illness  Dispo: The patient is from: Home              Anticipated d/c is to: Home              Patient currently is not medically stable to d/c.   Difficult to place patient No   DVT  prophylaxis: Enoxaparin   Code Status:    full  Family Communication:  I spoke with patient's wife and daughter at the bedside, we talked in detail about patient's condition, plan of care and prognosis and all questions were addressed.      Nutrition Status: Nutrition Problem: Increased nutrient needs Etiology: wound healing Signs/Symptoms: estimated needs Interventions: Magic cup, MVI     Consultants:  Podiatry   Procedures:  Right foot 1st toe amputation   Antimicrobials:  Cephalexin     Subjective: Patient with dizziness not able to perform physical therapy this am, no nausea or vomiting, no chest pain or dyspnea.   Objective: Vitals:   03/07/21 1953 03/08/21 0420 03/08/21 0424 03/08/21 0825  BP: (!) 169/77 (!) 168/90 (!) 168/90 (!) 193/83  Pulse: 79 89 89 81  Resp: 17 17    Temp: 98.4 F (36.9 C) 98.8 F (37.1 C) 98.8 F (37.1 C) 99.3 F (37.4 C)  TempSrc: Oral Oral Oral Oral  SpO2: 98% 98% 98% 97%  Weight:      Height:        Intake/Output Summary (Last 24 hours) at 03/08/2021 1003 Last data filed at 03/08/2021 0338 Gross per 24 hour  Intake 160 ml  Output 875 ml  Net -715 ml   Filed Weights   03/04/21 1924 03/05/21 1100  Weight: 92.5 kg 85.7 kg    Examination:   General: Not in pain or dyspnea, deconditioned  Neurology: Awake and alert, non focal, blind. Preserved strength upper and lower extremities, proximal and distal, non focal.  E ENT: no pallor, no icterus, oral mucosa moist Cardiovascular: No JVD. S1-S2 present, rhythmic, no gallops, rubs, or murmurs. No lower extremity edema. Pulmonary: positive breath sounds bilaterally, adequate air movement, no wheezing, rhonchi or rales. Gastrointestinal. Abdomen soft and non tender Skin. No rashes Musculoskeletal: no joint deformities     Data Reviewed: I have personally reviewed following labs and imaging studies  CBC: Recent Labs  Lab 03/04/21 0745 03/05/21 0152 03/06/21 0555  03/07/21 0009  WBC 10.4 11.1* 10.2 9.5  NEUTROABS  --  7.4 6.6  --   HGB 15.4 15.8 15.7 15.7  HCT 47.4 48.9 46.8 46.9  MCV 89.3 89.4 87.8 87.0  PLT 239 242 224 240   Basic Metabolic Panel: Recent Labs  Lab 03/04/21 0745 03/05/21 0152 03/06/21 0335 03/07/21 0009  NA 135 138 138 138  K 4.2 4.2 3.9 3.9  CL 100 104 107 104  CO2 26 24 23 26   GLUCOSE 183* 111* 127* 178*  BUN 26* 22 26* 27*  CREATININE 2.41* 1.95* 2.09* 1.91*  CALCIUM 9.9 10.3 9.8 10.0  MG  --  2.1  --   --    GFR: Estimated Creatinine Clearance: 29.6 mL/min (A) (by C-G formula based on SCr of 1.91 mg/dL (H)). Liver Function Tests: Recent Labs  Lab 03/05/21 0152  AST 31  31  ALT 25  26  ALKPHOS 62  62  BILITOT 1.0  1.0  PROT 6.9  7.0  ALBUMIN 3.3*  3.4*   No results for input(s): LIPASE,  AMYLASE in the last 168 hours. No results for input(s): AMMONIA in the last 168 hours. Coagulation Profile: No results for input(s): INR, PROTIME in the last 168 hours. Cardiac Enzymes: No results for input(s): CKTOTAL, CKMB, CKMBINDEX, TROPONINI in the last 168 hours. BNP (last 3 results) No results for input(s): PROBNP in the last 8760 hours. HbA1C: No results for input(s): HGBA1C in the last 72 hours. CBG: Recent Labs  Lab 03/07/21 0823 03/07/21 1227 03/07/21 1632 03/07/21 2042 03/08/21 0615  GLUCAP 153* 198* 169* 158* 239*   Lipid Profile: No results for input(s): CHOL, HDL, LDLCALC, TRIG, CHOLHDL, LDLDIRECT in the last 72 hours. Thyroid Function Tests: No results for input(s): TSH, T4TOTAL, FREET4, T3FREE, THYROIDAB in the last 72 hours. Anemia Panel: No results for input(s): VITAMINB12, FOLATE, FERRITIN, TIBC, IRON, RETICCTPCT in the last 72 hours.    Radiology Studies: I have reviewed all of the imaging during this hospital visit personally     Scheduled Meds:  allopurinol  200 mg Oral QHS   amLODipine  10 mg Oral Daily   aspirin  81 mg Oral Daily   cephALEXin  500 mg Oral Q12H    docusate sodium  100 mg Oral BID   hydrALAZINE  25 mg Oral Once   hydrALAZINE  75 mg Oral TID   insulin aspart  0-9 Units Subcutaneous TID WC   insulin glargine-yfgn  6 Units Subcutaneous QHS   metoprolol tartrate  50 mg Oral BID   multivitamin with minerals  1 tablet Oral Daily   pantoprazole  40 mg Oral Daily   rosuvastatin  10 mg Oral Daily   senna  1 tablet Oral Daily   zinc sulfate  220 mg Oral Daily   Continuous Infusions:   LOS: 4 days        Temara Lanum Annett Gula, MD

## 2021-03-08 NOTE — Progress Notes (Signed)
   PODIATRY PROGRESS NOTE  NAME Johnny [name] MRN [id number] DOB 1932-07-16 DOA 03/04/2021   Subjective: Patient status post right great toe amputation 03/07/2021.  Patient is doing very well.  He presents today at bedside with his spouse and daughter.  He denies nausea vomiting shortness of breath chest pain or fever.  Past Medical History:  Diagnosis Date   Aortic stenosis    BPH (benign prostatic hypertrophy)    Branch retinal vein occlusion 08/01/2013   CAD (coronary artery disease)    CVA (cerebral infarction)    Diabetes mellitus (HCC)    Diabetic retinopathy (HCC)    Gait disturbance    GERD (gastroesophageal reflux disease)    Gout    Heart murmur    HTN (hypertension)    Macular degeneration    Macular degeneration    Macular edema    Paresthesia    Pseudophakia of right eye    Ptosis     CBC Latest Ref Rng & Units 03/07/2021 03/06/2021 03/05/2021  WBC 4.0 - 10.5 K/uL 9.5 10.2 11.1(H)  Hemoglobin 13.0 - 17.0 g/dL 32.2 02.5 42.7  Hematocrit 39.0 - 52.0 % 46.9 46.8 48.9  Platelets 150 - 400 K/uL 240 224 242    BMP Latest Ref Rng & Units 03/07/2021 03/06/2021 03/05/2021  Glucose 70 - 99 mg/dL 062(B) 762(G) 315(V)  BUN 8 - 23 mg/dL 76(H) 60(V) 22  Creatinine 0.61 - 1.24 mg/dL 3.71(G) 6.26(R) 4.85(I)  Sodium 135 - 145 mmol/L 138 138 138  Potassium 3.5 - 5.1 mmol/L 3.9 3.9 4.2  Chloride 98 - 111 mmol/L 104 107 104  CO2 22 - 32 mmol/L 26 23 24   Calcium 8.9 - 10.3 mg/dL 9.8 62.7    ASSESSMENT/PLAN OF CARE S/P right great toe amputation.  DOS: 03/07/2021 -Patient is doing very well.  Pain tolerable.  Dressings are clean with no strikethrough.  Left intact today. -Clean margins intraoperatively.  Keep dressings clean dry and intact until follow-up in office outpatient -Patient may weight-bear as tolerated.  Weightbearing order placed. -Recommend DC on p.o. ABX x7 days -From a podiatry/surgical standpoint, patient okay to be discharged.  Follow-up within 1 week of  discharge in office    Please contact me directly with any questions or concerns.  Cell [phone]   [phone], DPM [facility]  Dr. Felecia Shelling, DPM    [address] [place], [place]                Office [phone]  Fax [phone]

## 2021-03-08 NOTE — Progress Notes (Signed)
Occupational Therapy Evaluation Patient Details Name: Johnny [name] MRN: [id number] DOB: 09-09-1932 Today's Date: 03/08/2021    History of Present Illness Pt is a 87yo male presenting to [facility] ED on 8/24 with complaints of generalized weakness and R great toe ulceration. Imaging showed possible osteomyelitis, no s/p R great toe amputation 8/27. PMH: HTN, gout, CAD, acute renal failure (blind), DM, MRSA.   Clinical Impression   Johnny [name] was evaluated s/p the above R toe amputation. PTA he required moderate-maximal assistance for all ADLs and used rollator for ambulation, family shared that he has limited participate in his care due to being blind. Upon evaluation, pt was mod A for sup>sit given verbal cues for sequencing and problem solving. Once sitting EOB pt reported dizziness and was observed with spasm-like jerky movements. His diastolic BP was elevated, therefore further OOB was deferred. RN present and aware, MD arrived at the end of session. BO stable after return to supine. Pt's wife stated the spasm-like movements are somewhat common for him. He would benefit from continued OT acutely. Recommend SNF at d/c.     Follow Up Recommendations  SNF;Supervision/Assistance - 24 hour    Equipment Recommendations  Other (comment) (defer to next venue)       Precautions / Restrictions Precautions Precautions: Fall Precaution Comments: Pt fell out of bed prior to admission, blind Restrictions Weight Bearing Restrictions: Yes RUE Weight Bearing: Weight bearing as tolerated      Mobility Bed Mobility Overal bed mobility: Needs Assistance Bed Mobility: Supine to Sit;Sit to Supine     Supine to sit: Mod assist Sit to supine: Mod assist   General bed mobility comments: Mod A to trunk control and to adjust hips towards the EOB with use of chuck pad    Transfers Overall transfer level: Needs assistance               General transfer comment: deferred this session due to elevated  BP    Balance Overall balance assessment: Needs assistance Sitting-balance support: Feet supported Sitting balance-Leahy Scale: Fair Sitting balance - Comments: pt able to sustain sitting balance, however had intermittent spasms that caused him to lose his balance. He was able to self corrent, min guard for safety               ADL either performed or assessed with clinical judgement   ADL Overall ADL's : Needs assistance/impaired Eating/Feeding: Maximal assistance;Bed level;Sitting Eating/Feeding Details (indicate cue type and reason): pt's daughter feeding him upon arrival. family noted that before this onset of symptoms he was able to feed himself, however his coordination has not allowed him to since onset of symptoms Grooming: Moderate assistance;Sitting   Upper Body Bathing: Moderate assistance;Sitting Upper Body Bathing Details (indicate cue type and reason): vc throughout Lower Body Bathing: Maximal assistance;Bed level   Upper Body Dressing : Minimal assistance;Sitting   Lower Body Dressing: Maximal assistance;+2 for physical assistance;+2 for safety/equipment;Sit to/from stand   Toilet Transfer: Maximal assistance;+2 for physical assistance;+2 for safety/equipment;BSC;RW;Stand-pivot   Toileting- Clothing Manipulation and Hygiene: Maximal assistance;+2 for physical assistance;+2 for safety/equipment;Sit to/from stand       Functional mobility during ADLs:  (limited to bed level this session due to blood pressure) General ADL Comments: pt required incrased assist due to blindness and self-limiting behaviors. Pt's familty explained that he has limited participation in all activies at home and is dependent in his care eventhough they believe he is able to do more for himself  Vision Baseline Vision/History: 2 Legally blind Ability to See in Adequate Light: 4 Severely impaired Patient Visual Report: No change from baseline              Pertinent Vitals/Pain  Pain Assessment: Faces Faces Pain Scale: Hurts a little bit Pain Location: generalized wtih movement Pain Descriptors / Indicators: Grimacing Pain Intervention(s): Monitored during session;Repositioned     Hand Dominance Right   Extremity/Trunk Assessment Upper Extremity Assessment RUE Deficits / Details: Pt prefers to keep right hand gripping on bed rail at all times, and grips PT hand during mobility tasks, likely secondary to sensory impairments. Gross strength 3+/5, reduced functional ROM such that he cannot reach behind his head. RUE Sensation: WNL LUE Deficits / Details: Pt's LUE demonstrated reduced functional ROM compared to RUE; pt unable to flex shoulder past ~20deg though does demonstrate nearly full elbow flexion. Wrist flexion WFL though reduced. Strength grossly 3/5. LUE Sensation: WNL   Lower Extremity Assessment Lower Extremity Assessment: Defer to PT evaluation   Cervical / Trunk Assessment Cervical / Trunk Assessment: Kyphotic   Communication Communication Communication: HOH   Cognition Arousal/Alertness: Awake/alert Behavior During Therapy: WFL for tasks assessed/performed Overall Cognitive Status: Impaired/Different from baseline Area of Impairment: Following commands;Safety/judgement;Problem solving                       Following Commands: Follows multi-step commands inconsistently Safety/Judgement: Decreased awareness of safety   Problem Solving: Slow processing;Decreased initiation;Difficulty sequencing;Requires verbal cues General Comments: pt pleasant this session, wife and daughter present   General Comments  VSS on RA. Once sitting EOB pt had reports of dizziness, he also had spasms in his lower extremeties. BP elevated 180's/127, 180's/108 while sitting EOB and supine BP was 190's/98. once supine pt denied dizziness, however the spasms continued            Home Living Family/patient expects to be discharged to:: Private  residence Living Arrangements: Spouse/significant other Available Help at Discharge: Family;Available 24 hours/day Type of Home: House Home Access: Stairs to enter [employer] of Steps: 1 Entrance Stairs-Rails: None Home Layout: One level     Bathroom Shower/Tub: Tub/shower unit;Walk-in shower   Bathroom Toilet: Handicapped height Bathroom Accessibility: Yes   Home Equipment: Grab bars - tub/shower;Walker - 4 wheels;Shower seat;Hand held shower head          Prior Functioning/Environment Level of Independence: Needs assistance  Gait / Transfers Assistance Needed: Uses rollator for ambulation. ADL's / Homemaking Assistance Needed: Wife bathes him, cooks for him, provides some assistance with dressing.            OT Problem List: Decreased strength;Decreased range of motion;Decreased activity tolerance;Impaired balance (sitting and/or standing);Decreased safety awareness;Decreased knowledge of use of DME or AE;Decreased knowledge of precautions;Pain      OT Treatment/Interventions: Self-care/ADL training;Therapeutic exercise;Therapeutic activities;Patient/family education;Balance training;DME and/or AE instruction    OT Goals(Current goals can be found in the care plan section) Acute Rehab OT Goals Patient Stated Goal: to get stronger OT Goal Formulation: With patient Time For Goal Achievement: 03/22/21 Potential to Achieve Goals: Fair  OT Frequency: Min 2X/week    AM-PAC OT "6 Clicks" Daily Activity     Outcome Measure Help from another person eating meals?: A Lot Help from another person taking care of personal grooming?: A Lot Help from another person toileting, which includes using toliet, bedpan, or urinal?: A Lot Help from another person bathing (including washing, rinsing, drying)?: A Lot Help from another  person to put on and taking off regular upper body clothing?: A Little Help from another person to put on and taking off regular lower body  clothing?: A Lot 6 Click Score: 13   End of Session Nurse Communication: Mobility status;Precautions;Weight bearing status  Activity Tolerance: Patient tolerated treatment well;Treatment limited secondary to medical complications (Comment) Patient left: in bed;with call bell/phone within reach;with bed alarm set;with family/visitor present  OT Visit Diagnosis: Unsteadiness on feet (R26.81);Other abnormalities of gait and mobility (R26.89);Muscle weakness (generalized) (M62.81);History of falling (Z91.81);Pain                Time: 3007-6226 OT Time Calculation (min): 21 min Charges:  OT General Charges $OT Visit: 1 Visit OT Evaluation $OT Eval Moderate Complexity: 1 Mod  Leeanna Slaby A Yutaka Holberg 03/08/2021, 10:38 AM

## 2021-03-09 DIAGNOSIS — M869 Osteomyelitis, unspecified: Secondary | ICD-10-CM

## 2021-03-09 LAB — GLUCOSE, CAPILLARY
Glucose-Capillary: 246 mg/dL — ABNORMAL HIGH (ref 70–99)
Glucose-Capillary: 271 mg/dL — ABNORMAL HIGH (ref 70–99)
Glucose-Capillary: 233 mg/dL — ABNORMAL HIGH (ref 70–99)
Glucose-Capillary: 177 mg/dL — ABNORMAL HIGH (ref 70–99)

## 2021-03-09 LAB — BASIC METABOLIC PANEL
Anion gap: 10 (ref 5–15)
BUN: 22 mg/dL (ref 8–23)
CO2: 24 mmol/L (ref 22–32)
Calcium: 10.7 mg/dL — ABNORMAL HIGH (ref 8.9–10.3)
Chloride: 101 mmol/L (ref 98–111)
Creatinine, Ser: 1.75 mg/dL — ABNORMAL HIGH (ref 0.61–1.24)
GFR, Estimated: 37 mL/min — ABNORMAL LOW (ref >60)
Glucose, Bld: 241 mg/dL — ABNORMAL HIGH (ref 70–99)
Potassium: 4.5 mmol/L (ref 3.5–5.1)
Sodium: 135 mmol/L (ref 135–145)

## 2021-03-09 MED ORDER — INSULIN GLARGINE-YFGN 100 UNIT/ML ~~LOC~~ SOLN
12.0000 [IU] | Freq: Every day | SUBCUTANEOUS | Status: DC
  Administered 2021-03-09 – 2021-03-10 (×2): 12 [IU] via SUBCUTANEOUS
  Filled 2021-03-09 (×3): qty 0.12

## 2021-03-09 MED ORDER — INSULIN GLARGINE-YFGN 100 UNIT/ML ~~LOC~~ SOLN
6.0000 [IU] | Freq: Once | SUBCUTANEOUS | Status: AC
  Administered 2021-03-09: 6 [IU] via SUBCUTANEOUS
  Filled 2021-03-09 (×2): qty 0.06

## 2021-03-09 MED ORDER — FUROSEMIDE 40 MG PO TABS
40.0000 mg | ORAL_TABLET | Freq: Every day | ORAL | Status: DC
  Administered 2021-03-09 – 2021-03-11 (×3): 40 mg via ORAL
  Filled 2021-03-09 (×3): qty 1

## 2021-03-09 NOTE — Plan of Care (Signed)

## 2021-03-09 NOTE — TOC Initial Note (Signed)
Transition of Care [facility]) - Initial/Assessment Note    Patient Details  Name: Johnny [name] MRN: [id number] Date of Birth: 1933-03-26  Transition of Care [facility]) CM/SW Contact:    Ralene Bathe, LCSWA Phone Number: 03/09/2021, 2:12 PM  Clinical Narrative:                 CSW received consult for possible SNF placement at time of discharge. CSW spoke with patient's wife. Patient's spouse is currently unable to care for patient at their home given patient's current physical needs and fall risk. Patient's wife expressed understanding of PT recommendation and is agreeable to SNF placement at time of discharge. Family reports preference for [facility].  If the patient is not accepted at [facility], the family is open to other facilities in [place].  CSW discussed insurance authorization process and will provide Medicare SNF ratings list. Patient has not received COVID vaccines.   Skilled Nursing Rehab Facilities-   [email or web address] Ratings out of 5 possible    Name Address  Phone # Quality Care Staffing Health Inspection Overall  [facility] [address], [place] [phone] 5 1 4 4   [facility]  [address] [place], [place] [phone] 3 1 5 4   [facility] [address] [place], [address] [place] 3 1 1 1   [facility] [address] 2 2 4 4   [facility] [address] [phone] 3 1 2 1   [facility] [phone] [address] [phone] 3 2 4 4   [facility] [address], [address] [place] 5 1 2 2   [facility] [address], [facility] [place] 5 2 2 3   [facility] [address] [facility] 5 1 2 2   [facility] Nursing [phone], [phone] 5 1 2 2   [facility] [address] [facility] [phone] 5 1 2 2   [facility]. [address] 3 1 1 1      Expected Discharge Plan: Skilled Nursing Facility Barriers to Discharge: Insurance Authorization, SNF Pending bed offer   Patient Goals and CMS Choice   CMS Medicare.gov Compare Post Acute Care list provided to:: Patient Represenative (must comment) Choice offered to / list presented to : Spouse  Expected Discharge Plan and Services Expected Discharge Plan: Skilled Nursing Facility       Living arrangements for the past 2 months: Single Family Home                                      Prior Living Arrangements/Services Living arrangements for the past 2 months: Single Family Home Lives with:: Spouse Patient language and need for interpreter reviewed:: Yes Do you feel safe going back to the place where you live?: Yes      Need for Family Participation in Patient Care: Yes (Comment) Care giver support system in place?: Yes (comment)   Criminal Activity/Legal Involvement Pertinent to Current Situation/Hospitalization: No - Comment as needed  Activities of Daily Living Home Assistive Devices/Equipment: Grab bars in shower, Walker (specify type) ADL Screening (condition at time of admission) Patient's cognitive ability adequate to safely complete daily activities?: No Is the patient deaf or have difficulty hearing?: Yes Does the patient have difficulty seeing, even when wearing glasses/contacts?: Yes Does the patient have difficulty concentrating, remembering, or making decisions?: Yes Patient able to express  need for assistance with ADLs?: Yes Does the patient have difficulty dressing or bathing?: Yes Independently performs ADLs?: No Communication: Independent Dressing (OT): Needs assistance Is this a change from baseline?: Pre-admission baseline Grooming: Needs assistance Is this a change from baseline?: Pre-admission baseline Feeding: Needs assistance Is this a change from baseline?: Pre-admission baseline Bathing: Needs assistance Is  this a change from baseline?: Pre-admission baseline Toileting: Needs assistance Is this a change from baseline?: Pre-admission baseline In/Out Bed: Needs assistance Is this a change from baseline?: Pre-admission baseline Walks in Home: Needs assistance Is this a change from baseline?: Pre-admission baseline Does the patient have difficulty walking or climbing stairs?: Yes Weakness of Legs: Both Weakness of Arms/Hands: Both  Permission Sought/Granted   Permission granted to share information with : Yes, Verbal Permission Granted     Permission granted to share info w AGENCY: SNF        Emotional Assessment       Orientation: : Oriented to Self, Oriented to Place, Oriented to Situation, Oriented to  Time Alcohol / Substance Use: Not Applicable Psych Involvement: No (comment)  Admission diagnosis:  Weakness [R53.1] AKI (acute kidney injury) (HCC) [N17.9] Acute kidney injury (HCC) [N17.9] Toe infection [L08.9] Patient Active Problem List   Diagnosis Date Noted   Acute renal failure superimposed on stage 3b chronic kidney disease (HCC) 03/04/2021   Osteomyelitis of great toe of right foot (HCC) 03/04/2021   Type 2 diabetes mellitus with stage 3b chronic kidney disease, with long-term current use of insulin (HCC) 03/04/2021   Mixed diabetic hyperlipidemia associated with type 2 diabetes mellitus (HCC) 03/04/2021   Cellulitis 05/06/2019   Left leg cellulitis    Acquired hammertoes of both feet 12/20/2018   Bilateral impacted cerumen 08/11/2018   Hearing loss 08/11/2018   Pseudophakia of right eye    Macular edema    Heart murmur    Gout    GERD without esophagitis    Diabetic retinopathy (HCC)    Coronary artery disease involving native heart without angina pectoris    UTI (urinary tract infection)    Bacteremia    Leukocytosis    Hyperlipidemia LDL goal <70    Legally blind    Acute ischemic stroke (HCC) 04/27/2017   Stroke (cerebrum) (HCC) 04/27/2017   Vertigo  04/25/2017   CKD (chronic kidney disease) stage 3, GFR 30-59 ml/min (HCC) 04/25/2017   Mechanical breakdown of intraocular lens 09/09/2015   Ocular hypertension of left eye 09/09/2015   Bunion 07/26/2014   Acute encephalopathy 01/03/2014   Aortic valve replaced 01/03/2014   CAD in native artery 01/03/2014   Renal cyst 01/03/2014   Fever 01/02/2014   Anemia of chronic kidney failure, stage 3 (moderate) (HCC) 12/12/2013   Diabetic nephropathy associated with type 2 diabetes mellitus (HCC) 12/12/2013   Iron deficiency anemia 12/12/2013   Proteinuria 12/12/2013   Secondary hyperparathyroidism of renal origin (HCC) 12/12/2013   Vitamin D deficiency 12/12/2013   Status post intraocular lens implant 11/09/2013   Pain in lower limb 10/26/2013   PCO (posterior capsular opacification) 10/05/2013   Combined senile cataract 08/06/2013   Branch retinal vein occlusion 08/01/2013   Severe hypoxemia 04/07/2013   Acute on chronic heart failure (HCC) 01/14/2013   Onychomycosis 11/17/2012   Pain in joint, ankle and foot 11/17/2012   Aortic stenosis 02/25/2012   Abnormal ECG 02/25/2012   Essential hypertension    Paresthesia    Diabetes mellitus (HCC)    Macular degeneration    CVA (cerebral  infarction)    Ptosis    Gait disturbance    Central serous chorioretinopathy 09/14/2011   History of stroke 08/13/2011   Type 2 diabetes mellitus without complications (HCC) 06/11/2011   PCP:  Merri Brunette, MD Pharmacy:   [id number] - [place], [place] - [phone] [place] [address] Phone: [phone] Fax: [phone]  [facility] - [place], [place] - [address] [place] [address] Phone: [phone] Fax: [phone]     Social Determinants of Health (SDOH) Interventions    Readmission Risk Interventions No flowsheet data found.

## 2021-03-09 NOTE — [place] FL2 (Signed)
[place]  MEDICAID FL2 LEVEL OF CARE SCREENING TOOL     IDENTIFICATION  Patient Name: Johnny [name] Birthdate: 1933/01/30 Sex: male Admission Date (Current Location): 03/04/2021  [facility] and [place] Number:  Producer, television/film/video and Address:  [facility], [address] [place], [place] [address]      Provider Number: [id number]  Attending Physician Name and Address:  Coralie Keens,*  Relative Name and Phone Number:  Philo, Kurtz (Spouse)   [phone]    Current Level of Care: Hospital Recommended Level of Care: Skilled Nursing Facility Prior Approval Number:    Date Approved/Denied:   PASRR Number: [id number]  Discharge Plan: SNF    Current Diagnoses: Patient Active Problem List   Diagnosis Date Noted   Acute renal failure superimposed on stage 3b chronic kidney disease (HCC) 03/04/2021   Osteomyelitis of great toe of right foot (HCC) 03/04/2021   Type 2 diabetes mellitus with stage 3b chronic kidney disease, with long-term current use of insulin (HCC) 03/04/2021   Mixed diabetic hyperlipidemia associated with type 2 diabetes mellitus (HCC) 03/04/2021   Cellulitis 05/06/2019   Left leg cellulitis    Acquired hammertoes of both feet 12/20/2018   Bilateral impacted cerumen 08/11/2018   Hearing loss 08/11/2018   Pseudophakia of right eye    Macular edema    Heart murmur    Gout    GERD without esophagitis    Diabetic retinopathy (HCC)    Coronary artery disease involving native heart without angina pectoris    UTI (urinary tract infection)    Bacteremia    Leukocytosis    Hyperlipidemia LDL goal <70    Legally blind    Acute ischemic stroke (HCC) 04/27/2017   Stroke (cerebrum) (HCC) 04/27/2017   Vertigo 04/25/2017   CKD (chronic kidney disease) stage 3, GFR 30-59 ml/min (HCC) 04/25/2017   Mechanical breakdown of intraocular lens 09/09/2015   Ocular hypertension of left eye 09/09/2015   Bunion 07/26/2014   Acute  encephalopathy 01/03/2014   Aortic valve replaced 01/03/2014   CAD in native artery 01/03/2014   Renal cyst 01/03/2014   Fever 01/02/2014   Anemia of chronic kidney failure, stage 3 (moderate) (HCC) 12/12/2013   Diabetic nephropathy associated with type 2 diabetes mellitus (HCC) 12/12/2013   Iron deficiency anemia 12/12/2013   Proteinuria 12/12/2013   Secondary hyperparathyroidism of renal origin (HCC) 12/12/2013   Vitamin D deficiency 12/12/2013   Status post intraocular lens implant 11/09/2013   Pain in lower limb 10/26/2013   PCO (posterior capsular opacification) 10/05/2013   Combined senile cataract 08/06/2013   Branch retinal vein occlusion 08/01/2013   Severe hypoxemia 04/07/2013   Acute on chronic heart failure (HCC) 01/14/2013   Onychomycosis 11/17/2012   Pain in joint, ankle and foot 11/17/2012   Aortic stenosis 02/25/2012   Abnormal ECG 02/25/2012   Essential hypertension    Paresthesia    Diabetes mellitus (HCC)    Macular degeneration    CVA (cerebral infarction)    Ptosis    Gait disturbance    Central serous chorioretinopathy 09/14/2011   History of stroke 08/13/2011   Type 2 diabetes mellitus without complications (HCC) 06/11/2011    Orientation RESPIRATION BLADDER Height & Weight     Time, Situation, Self, Place  Normal Continent, External catheter Weight: 188 lb 15 oz (85.7 kg) Height:  5\' 8"  (172.7 cm)  BEHAVIORAL SYMPTOMS/MOOD NEUROLOGICAL BOWEL NUTRITION STATUS      Continent Diet (see d/c summary)  AMBULATORY  STATUS COMMUNICATION OF NEEDS Skin   Extensive Assist Non-Verbally Surgical wounds                       Personal Care Assistance Level of Assistance  Bathing, Dressing, Feeding Bathing Assistance: Limited assistance Feeding assistance: Limited assistance Dressing Assistance: Limited assistance     Functional Limitations Info  Sight, Speech, Hearing Sight Info: Impaired Hearing Info: Impaired Speech Info: Adequate    SPECIAL  CARE FACTORS FREQUENCY  PT (By licensed PT), OT (By licensed OT)     PT Frequency: 5x/ week OT Frequency: 5x/week            Contractures Contractures Info: Not present    Additional Factors Info  Code Status, Allergies, Insulin Sliding Scale Code Status Info: Full Allergies Info: Glyxambi (Empagliflozin-linagliptin)   Heparin   Insulin Sliding Scale Info: See d/c summary       Current Medications (03/09/2021):  This is the current hospital active medication list Current Facility-Administered Medications  Medication Dose Route Frequency Provider Last Rate Last Admin   acetaminophen (TYLENOL) tablet 650 mg  650 mg Oral Q6H PRN Shalhoub, Deno Lunger, MD       Or   acetaminophen (TYLENOL) suppository 650 mg  650 mg Rectal Q6H PRN Shalhoub, Deno Lunger, MD       allopurinol (ZYLOPRIM) tablet 200 mg  200 mg Oral QHS Shalhoub, Deno Lunger, MD   200 mg at 03/08/21 2113   amLODipine (NORVASC) tablet 10 mg  10 mg Oral Daily Arrien, York Ram, MD   10 mg at 03/09/21 1019   aspirin chewable tablet 81 mg  81 mg Oral Daily Marinda Elk, MD   81 mg at 03/09/21 1020   cephALEXin (KEFLEX) capsule 500 mg  500 mg Oral Q12H Arrien, York Ram, MD   500 mg at 03/09/21 1020   docusate sodium (COLACE) capsule 100 mg  100 mg Oral BID Marinda Elk, MD   100 mg at 03/09/21 1019   furosemide (LASIX) tablet 40 mg  40 mg Oral Daily Arrien, York Ram, MD   40 mg at 03/09/21 1244   hydrALAZINE (APRESOLINE) tablet 75 mg  75 mg Oral TID Coralie Keens, MD   75 mg at 03/09/21 1019   insulin aspart (novoLOG) injection 0-9 Units  0-9 Units Subcutaneous TID WC Arrien, York Ram, MD   5 Units at 03/09/21 1244   insulin glargine-yfgn (SEMGLEE) injection 12 Units  12 Units Subcutaneous QHS Arrien, York Ram, MD       insulin glargine-yfgn [facility]) injection 6 Units  6 Units Subcutaneous Once Arrien, York Ram, MD       metoprolol tartrate (LOPRESSOR) tablet 50 mg  50 mg  Oral BID Marinda Elk, MD   50 mg at 03/09/21 1020   morphine 2 MG/ML injection 2 mg  2 mg Intravenous Q2H PRN Felecia Shelling, DPM       multivitamin with minerals tablet 1 tablet  1 tablet Oral Daily Arrien, York Ram, MD   1 tablet at 03/09/21 1020   ondansetron (ZOFRAN) tablet 4 mg  4 mg Oral Q6H PRN Marinda Elk, MD       Or   ondansetron [facility]) injection 4 mg  4 mg Intravenous Q6H PRN Shalhoub, Deno Lunger, MD       oxyCODONE (Oxy IR/ROXICODONE) immediate release tablet 5 mg  5 mg Oral Q4H PRN Arrien, York Ram, MD   5 mg at 03/09/21  1110   pantoprazole (PROTONIX) EC tablet 40 mg  40 mg Oral Daily Shalhoub, Deno Lunger, MD   40 mg at 03/09/21 1020   polyethylene glycol (MIRALAX / GLYCOLAX) packet 17 g  17 g Oral Daily PRN Shalhoub, Deno Lunger, MD       rosuvastatin (CRESTOR) tablet 10 mg  10 mg Oral Daily Shalhoub, Deno Lunger, MD   10 mg at 03/09/21 1020   senna (SENOKOT) tablet 8.6 mg  1 tablet Oral Daily Shalhoub, Deno Lunger, MD   8.6 mg at 03/09/21 1019   zinc sulfate capsule 220 mg  220 mg Oral Daily Shalhoub, Deno Lunger, MD   220 mg at 03/09/21 1019     Discharge Medications: Please see discharge summary for a list of discharge medications.  Relevant Imaging Results:  Relevant Lab Results:   Additional Information SS#: [id number]   (patient has not had any COVID vaccinations for religious reasons)  Ravan Schlemmer F Audy Dauphine, LCSWA

## 2021-03-09 NOTE — Progress Notes (Signed)
PROGRESS NOTE    [name]  KXF:[id number] DOB: 1933/04/14 DOA: 03/04/2021 PCP: Merri Brunette, MD    Brief Narrative:  Johnny [name] was admitted to the hospital with the working diagnosis of right foot great toe osteomyelitis.    87 yo male with the past medical history of chronic renal disease stage IIIb, type 2 diabetes mellitus, hypertension, aortic stenosis status post valve replacement 2014, coronary artery disease status post bypass, GERD, gout and dyslipidemia who presented with progressive weakness.  Reported right foot great toe ulceration for about a month, associated with progressive edema and erythema.  7 days leading to his hospitalization he was noticed to have progressive weakness, generalized, associated with poor appetite.  His wound became purulent and he was brought to the hospital.  On his initial physical examination blood pressure 182/80, heart rate 72, respiratory rate 18, temperature 98.4, oxygen saturation 92%, his lungs are clear to auscultation bilaterally, heart S1-S2, present, rhythmic, soft abdomen, right foot first toe ulcerated wound.   Sodium 135 potassium 4.2, chloride 100, bicarb 26, glucose 183, BUN 26, creatinine 2.41, white count 10.4, hemoglobin 15.4, hematocrit 47.4 platelets 239. SARS COVID-19 negative.   Urinalysis specific gravity 1.008, negative nitrates.  100 protein.   Right foot radiograph with soft tissue edema and soft tissue defect consistent with ulceration.  Particular cortical erosion in the interphalangeal joint may represent osteomyelitis.   EKG 57 bpm, left axis deviation, first-degree AV block, interventricular conduction delay, sinus rhythm, no significant ST segment T wave changes, positive LVH.   Patient has been placed on IV broad spectrum antibiotic therapy. Podiatry consultation has recommend amputation during this hospitalization.   08/27 amputation of great toe on the right.    Patient very weak and deconditioned, will need  SNF.   Assessment & Plan:   Principal Problem:   Osteomyelitis of great toe of right foot (HCC) Active Problems:   Essential hypertension   Gout   GERD without esophagitis   Coronary artery disease involving native heart without angina pectoris   Acute renal failure superimposed on stage 3b chronic kidney disease (HCC)   Type 2 diabetes mellitus with stage 3b chronic kidney disease, with long-term current use of insulin (HCC)   Mixed diabetic hyperlipidemia associated with type 2 diabetes mellitus (HCC)   Right foot first toe proximal and distal phalanges osteomyelitis.   Sp amputation of 1st great toe on the right.     Tolerating well antibiotic therapy with cephalexin, stop on 03/15/21. Pain control (hydromorphone and oxycodone), continue wound care. Orthopedic shoe per podiatry.    Patient will need SNF to continue physical and occupational therapy.     2. T2DM/ dyslipidemia  Continue to have hyperglycemia and uncontrolled T2dm.    At home on 24 units of basal insulin, plan to increase to 12 units from 6 units of basal insulin. Add one time dose of 6 units now of basal insulin and change to 12 units qhs of glargine. Continue sliding scale insulin for glucose cover and monitoring  Continue with rosuvastatin.   Patient is tolerating po well.    3. HTN (uncontrolled). CAD  Blood pressure continue to be elevated, despite increase dose of hydralazine 75 mg po tid.  Plan to continue with hydralazine, amlodipine and metoprolol, and resume furosemide.    4. AKI on CKD stage 3b  Renal function with serum cr at 1,75 with k at 4,5 and serum bicarbonate at 24. Resume furosemide for diuresis and better blood pressure control.  5. GERD  PPI     6. Gout. No acute flare, on allopurionol  Patient continue to be at high risk for worsening hypertension and hyperglycemia.   Status is: Inpatient  Remains inpatient appropriate because:Inpatient level of care appropriate due to  severity of illness  Dispo: The patient is from: Home              Anticipated d/c is to: SNF              Patient currently is not medically stable to d/c.   Difficult to place patient No  DVT prophylaxis: Enoxaparin   Code Status:    full  Family Communication:    I spoke over the phone with the patient's wife about patient's  condition, plan of care, prognosis and all questions were addressed.   Nutrition Status: Nutrition Problem: Increased nutrient needs Etiology: wound healing Signs/Symptoms: estimated needs Interventions: Magic cup, MVI   Consultants:  Podiatry   Procedures:  Right foot 1st toe amputation   Antimicrobials:  Cephalexin     Subjective: Patient is out of bed to chair, continue to be very weak and deconditioned, no nausea or vomiting. No chest pain or dyspnea. Right foot pain is controlled with analgesics   Objective: Vitals:   03/08/21 2042 03/09/21 0430 03/09/21 0700 03/09/21 1017  BP: (!) 164/77 (!) 183/81 (!) 196/89 (!) 206/96  Pulse: 68 63 77 68  Resp: 18 18 19 18   Temp: 98.6 F (37 C) 98.2 F (36.8 C) 98.4 F (36.9 C) 98.9 F (37.2 C)  TempSrc:  Oral Oral Oral  SpO2: 92% 95% 97% 98%  Weight:      Height:        Intake/Output Summary (Last 24 hours) at 03/09/2021 1123 Last data filed at 03/09/2021 0957 Gross per 24 hour  Intake 360 ml  Output 1300 ml  Net -940 ml   Filed Weights   03/04/21 1924 03/05/21 1100  Weight: 92.5 kg 85.7 kg    Examination:   General: Not in pain or dyspnea, deconditioned  Neurology: Awake and alert, non focal  E ENT: no pallor, no icterus, oral mucosa moist Cardiovascular: No JVD. S1-S2 present, rhythmic, no gallops, rubs, or murmurs. No lower extremity edema. Pulmonary: positive breath sounds bilaterally, adequate air movement, no wheezing, rhonchi or rales. Gastrointestinal. Abdomen soft and non tender Skin. No rashes Musculoskeletal: no joint deformities     Data Reviewed: I have personally  reviewed following labs and imaging studies  CBC: Recent Labs  Lab 03/04/21 0745 03/05/21 0152 03/06/21 0555 03/07/21 0009  WBC 10.4 11.1* 10.2 9.5  NEUTROABS  --  7.4 6.6  --   HGB 15.4 15.8 15.7 15.7  HCT 47.4 48.9 46.8 46.9  MCV 89.3 89.4 87.8 87.0  PLT 239 242 224 240   Basic Metabolic Panel: Recent Labs  Lab 03/04/21 0745 03/05/21 0152 03/06/21 0335 03/07/21 0009 03/09/21 0332  NA 135 138 138 138 135  K 4.2 4.2 3.9 3.9 4.5  CL 100 104 107 104 101  CO2 26 24 23 26 24   GLUCOSE 183* 111* 127* 178* 241*  BUN 26* 22 26* 27* 22  CREATININE 2.41* 1.95* 2.09* 1.91* 1.75*  CALCIUM 9.9 10.3 9.8 10.0 10.7*  MG  --  2.1  --   --   --    GFR: Estimated Creatinine Clearance: 32.3 mL/min (A) (by C-G formula based on SCr of 1.75 mg/dL (H)). Liver Function Tests: Recent Labs  Lab 03/05/21 [phone]  AST 31  31  ALT 25  26  ALKPHOS 62  62  BILITOT 1.0  1.0  PROT 6.9  7.0  ALBUMIN 3.3*  3.4*   No results for input(s): LIPASE, AMYLASE in the last 168 hours. No results for input(s): AMMONIA in the last 168 hours. Coagulation Profile: No results for input(s): INR, PROTIME in the last 168 hours. Cardiac Enzymes: No results for input(s): CKTOTAL, CKMB, CKMBINDEX, TROPONINI in the last 168 hours. BNP (last 3 results) No results for input(s): PROBNP in the last 8760 hours. HbA1C: No results for input(s): HGBA1C in the last 72 hours. CBG: Recent Labs  Lab 03/08/21 0615 03/08/21 1159 03/08/21 1648 03/08/21 2104 03/09/21 0611  GLUCAP 239* 285* 223* 233* 246*   Lipid Profile: No results for input(s): CHOL, HDL, LDLCALC, TRIG, CHOLHDL, LDLDIRECT in the last 72 hours. Thyroid Function Tests: No results for input(s): TSH, T4TOTAL, FREET4, T3FREE, THYROIDAB in the last 72 hours. Anemia Panel: No results for input(s): VITAMINB12, FOLATE, FERRITIN, TIBC, IRON, RETICCTPCT in the last 72 hours.    Radiology Studies: I have reviewed all of the imaging during this hospital  visit personally     Scheduled Meds:  allopurinol  200 mg Oral QHS   amLODipine  10 mg Oral Daily   aspirin  81 mg Oral Daily   cephALEXin  500 mg Oral Q12H   docusate sodium  100 mg Oral BID   hydrALAZINE  75 mg Oral TID   insulin aspart  0-9 Units Subcutaneous TID WC   insulin glargine-yfgn  6 Units Subcutaneous QHS   metoprolol tartrate  50 mg Oral BID   multivitamin with minerals  1 tablet Oral Daily   pantoprazole  40 mg Oral Daily   rosuvastatin  10 mg Oral Daily   senna  1 tablet Oral Daily   zinc sulfate  220 mg Oral Daily   Continuous Infusions:   LOS: 5 days        Johnny [name] Annett Gula, MD

## 2021-03-09 NOTE — Progress Notes (Signed)
MEWS NOTE     03/09/21 1017  Assess: MEWS Score  Temp 98.9 F (37.2 C)  BP (!) 206/96  Pulse Rate 68  Resp 18  SpO2 98 %  O2 Device Room Air  Assess: MEWS Score  MEWS Temp 0  MEWS Systolic 2  MEWS Pulse 0  MEWS RR 0  MEWS LOC 0  MEWS Score 2  MEWS Score Color Yellow  Assess: if the MEWS score is Yellow or Red  Were vital signs taken at a resting state? Yes  Focused Assessment No change from prior assessment  Early Detection of Sepsis Score *See Row Information* Low  MEWS guidelines implemented *See Row Information* No, vital signs rechecked (Will recheck after scheduled BP medications)  Treat  MEWS Interventions Administered scheduled meds/treatments  Pain Scale 0-10  Pain Score 0  Take Vital Signs  Increase Vital Sign Frequency  Yellow: Q 2hr X 2 then Q 4hr X 2, if remains yellow, continue Q 4hrs  Escalate  MEWS: Escalate Yellow: discuss with charge nurse/RN and consider discussing with provider and RRT  Notify: Charge Nurse/RN  Name of Charge Nurse/RN Notified Florene Glen RN  Date Charge Nurse/RN Notified 03/09/21  Time Charge Nurse/RN Notified 1038  Notify: Provider  Provider Name/Title Dr. Ella Jubilee  Date Provider Notified 03/09/21  Time Provider Notified 1040  Notification Type Page  Notification Reason Change in status (Yellow MEWS)

## 2021-03-09 NOTE — Progress Notes (Signed)
   PODIATRY PROGRESS NOTE  NAME Johnny [name] MRN [id number] DOB 1933-01-10 DOA 03/04/2021   Subjective: Patient status post right great toe amputation 03/07/2021.  Patient is doing well.  He is sitting at bedside today.  He denies any other acute complaints.  Mild pain in the toe.  He denies nausea vomiting shortness of breath chest pain or fever.  Past Medical History:  Diagnosis Date   Aortic stenosis    BPH (benign prostatic hypertrophy)    Branch retinal vein occlusion 08/01/2013   CAD (coronary artery disease)    CVA (cerebral infarction)    Diabetes mellitus (HCC)    Diabetic retinopathy (HCC)    Gait disturbance    GERD (gastroesophageal reflux disease)    Gout    Heart murmur    HTN (hypertension)    Macular degeneration    Macular degeneration    Macular edema    Paresthesia    Pseudophakia of right eye    Ptosis     CBC Latest Ref Rng & Units 03/07/2021 03/06/2021 03/05/2021  WBC 4.0 - 10.5 K/uL 9.5 10.2 11.1(H)  Hemoglobin 13.0 - 17.0 g/dL 41.9 62.2 29.7  Hematocrit 39.0 - 52.0 % 46.9 46.8 48.9  Platelets 150 - 400 K/uL 240 224 242    BMP Latest Ref Rng & Units 03/09/2021 03/07/2021 03/06/2021  Glucose 70 - 99 mg/dL 989(Q) 119(E) 174(Y)  BUN 8 - 23 mg/dL 22 81(K) 48(J)  Creatinine 0.61 - 1.24 mg/dL 8.56(D) 1.49(F) 0.26(V)  Sodium 135 - 145 mmol/L 135 138 138  Potassium 3.5 - 5.1 mmol/L 4.5 3.9 3.9  Chloride 98 - 111 mmol/L 101 104 107  CO2 22 - 32 mmol/L 24 26 23   Calcium 8.9 - 10.3 mg/dL 10.7(H) 10.0 9.8    ASSESSMENT/PLAN OF CARE S/P right great toe amputation.  DOS: 03/07/2021 -Patient is doing very well.  Pain tolerable.  Dressings are clean with no strikethrough.  Left intact today. -Clean margins intraoperatively.  Keep dressings clean dry and intact until follow-up in office outpatient -Patient may weight-bear as tolerated in surgical shoe.  Order for surgical shoe was placed -Recommend DC on p.o. ABX x7 days -From a podiatry/surgical standpoint,  patient okay to be discharged.  Follow-up within 1 week of discharge in office   03/09/2021, D.P.M.

## 2021-03-09 NOTE — Progress Notes (Signed)
Orthopedic Tech Progress Note Patient Details:  Johnny [name] 05-18-1933 [id number]  Ortho Devices Type of Ortho Device: Postop shoe/boot Ortho Device/Splint Location: RLE Ortho Device/Splint Interventions: Ordered, Application   Post Interventions Patient Tolerated: Well Instructions Provided: Care of device  [name] 03/09/2021, 11:31 AM

## 2021-03-09 NOTE — Progress Notes (Addendum)
Physical Therapy Treatment Patient Details Name: Johnny [name] MRN: [id number] DOB: October 26, 1932 Today's Date: 03/09/2021    History of Present Illness Pt is a 87yo male presenting to [facility] ED on 8/24 with complaints of generalized weakness and R great toe ulceration. Imaging showed possible osteomyelitis, s/p R great toe amputation 8/28. PMH: HTN, gout, CAD, acute renal failure (blind), DM, MRSA.    PT Comments    Supine in bed on arrival.  Nursing attempting to give meds this session.  Pt required cues for hand placement with hand over hand guidance due to blindness.  He presents with jerking this session.  Pt continues to benefit from skilled rehab in a post acute setting to improve strength and function before returning home..     Follow Up Recommendations  SNF     Equipment Recommendations  Rolling walker with 5" wheels;Wheelchair (measurements PT);Wheelchair cushion (measurements PT)    Recommendations for Other Services       Precautions / Restrictions Precautions Precautions: Fall Precaution Comments: Pt fell out of bed prior to admission, blind Required Braces or Orthoses: Other Brace Other Brace: R post op shoe Restrictions Weight Bearing Restrictions: Yes RUE Weight Bearing: Weight bearing as tolerated (in post op shoe)    Mobility  Bed Mobility Overal bed mobility: Needs Assistance Bed Mobility: Supine to Sit     Supine to sit: Mod assist     General bed mobility comments: Cues for move LEs to edge of bed and to elevate trunk into a seated position.  Pt with jerking movement in sitting.  Cues for hand placement with hand over hand support.    Transfers Overall transfer level: Needs assistance Equipment used: Rolling walker (2 wheeled) Transfers: Sit to/from Stand Sit to Stand: Max assist         General transfer comment: Cues for hand placement and max assistance to boost into standing .  Pt presents with posterior bias this session.  He required  assistance and step step cues for RW position, foot placement and shofting weight to improve balance.  Ambulation/Gait Ambulation/Gait assistance: Max assist Gait Distance (Feet): 4 Feet Assistive device: Rolling walker (2 wheeled) Gait Pattern/deviations: Step-to pattern;Trunk flexed;Leaning posteriorly     General Gait Details: Pt required cues for side steping, turning and backing.  He was very shaky and slow to process.  Performed from bed to recliner.   Stairs             Wheelchair Mobility    Modified Rankin (Stroke Patients Only)       Balance Overall balance assessment: Needs assistance Sitting-balance support: Feet supported Sitting balance-Leahy Scale: Fair       Standing balance-Leahy Scale: Zero Standing balance comment: Pt reliant on BUE on RW as well as mod-max assist from PT to maintain standing balance. Pt unable to acheive full upright standing posture                            Cognition Arousal/Alertness: Awake/alert Behavior During Therapy: WFL for tasks assessed/performed Overall Cognitive Status: Impaired/Different from baseline Area of Impairment: Following commands;Safety/judgement;Problem solving                       Following Commands: Follows multi-step commands inconsistently Safety/Judgement: Decreased awareness of safety   Problem Solving: Slow processing;Decreased initiation;Difficulty sequencing;Requires verbal cues General Comments: pt pleasant this session      Exercises  General Comments        Pertinent Vitals/Pain Pain Assessment: Faces Faces Pain Scale: Hurts even more Pain Location: generalized wtih movement Pain Descriptors / Indicators: Grimacing;Discomfort Pain Intervention(s): Monitored during session;Patient requesting pain meds-RN notified    Home Living                      Prior Function            PT Goals (current goals can now be found in the care plan section)  Acute Rehab PT Goals Patient Stated Goal: to get stronger Potential to Achieve Goals: Good Progress towards PT goals: Progressing toward goals    Frequency    Min 3X/week      PT Plan Current plan remains appropriate    Co-evaluation              AM-PAC PT "6 Clicks" Mobility   Outcome Measure  Help needed turning from your back to your side while in a flat bed without using bedrails?: A Little Help needed moving from lying on your back to sitting on the side of a flat bed without using bedrails?: A Lot Help needed moving to and from a bed to a chair (including a wheelchair)?: A Lot Help needed standing up from a chair using your arms (e.g., wheelchair or bedside chair)?: A Lot Help needed to walk in hospital room?: A Lot Help needed climbing 3-5 steps with a railing? : A Lot 6 Click Score: 13    End of Session Equipment Utilized During Treatment: Gait belt Activity Tolerance: Patient tolerated treatment well Patient left: in bed;with bed alarm set;with call bell/phone within reach Nurse Communication: Mobility status PT Visit Diagnosis: Muscle weakness (generalized) (M62.81);History of falling (Z91.81);Unsteadiness on feet (R26.81);Other abnormalities of gait and mobility (R26.89)     Time: 6384-6659 PT Time Calculation (min) (ACUTE ONLY): 19 min  Charges:  $Therapeutic Activity: 8-22 mins                     Johnny [name] , PTA Acute Rehabilitation Services Pager [phone] Office [phone]    Johnny [name] Artis Delay 03/09/2021, 11:03 AM

## 2021-03-10 ENCOUNTER — Inpatient Hospital Stay (HOSPITAL_COMMUNITY): Payer: Medicare Other

## 2021-03-10 LAB — CULTURE, BLOOD (ROUTINE X 2)
Culture: NO GROWTH
Culture: NO GROWTH
Special Requests: ADEQUATE

## 2021-03-10 LAB — RESP PANEL BY RT-PCR (FLU A&B, COVID) ARPGX2
Influenza A by PCR: NEGATIVE
Influenza B by PCR: NEGATIVE
SARS Coronavirus 2 by RT PCR: NEGATIVE

## 2021-03-10 LAB — GLUCOSE, CAPILLARY
Glucose-Capillary: 241 mg/dL — ABNORMAL HIGH (ref 70–99)
Glucose-Capillary: 216 mg/dL — ABNORMAL HIGH (ref 70–99)
Glucose-Capillary: 233 mg/dL — ABNORMAL HIGH (ref 70–99)
Glucose-Capillary: 199 mg/dL — ABNORMAL HIGH (ref 70–99)

## 2021-03-10 MED ORDER — HYDRALAZINE HCL 50 MG PO TABS
100.0000 mg | ORAL_TABLET | Freq: Three times a day (TID) | ORAL | Status: DC
  Administered 2021-03-10 – 2021-03-11 (×4): 100 mg via ORAL
  Filled 2021-03-10 (×4): qty 2

## 2021-03-10 NOTE — Plan of Care (Signed)
  Problem: Health Behavior/Discharge Planning: Goal: Ability to manage health-related needs will improve Outcome: Progressing   Problem: Clinical Measurements: Goal: Ability to maintain clinical measurements within normal limits will improve Outcome: Progressing Goal: Will remain free from infection Outcome: Progressing   

## 2021-03-10 NOTE — TOC Progression Note (Signed)
Transition of Care [facility]) - Progression Note    Patient Details  Name: Johnny [name] MRN: [id number] Date of Birth: 27-Jul-1932  Transition of [facility]) CM/SW Contact  Ralene Bathe, LCSWA Phone Number: 03/10/2021, 11:48 AM  Clinical Narrative:     CSW presented bed offers to the patient's wife.  The family has chosen [facility].  The SNF will accept the patient.  CSW requested that [facility] CMAs start insurance authorization and requested and that an order for a rapid COVID test be entered.  Pending: insurance auth and COVID screening.    Expected Discharge Plan: Skilled Nursing Facility Barriers to Discharge: English as a second language teacher, SNF Pending bed offer  Expected Discharge Plan and Services Expected Discharge Plan: Skilled Nursing Facility       Living arrangements for the past 2 months: Single Family Home                                       Social Determinants of Health (SDOH) Interventions    Readmission Risk Interventions No flowsheet data found.

## 2021-03-10 NOTE — Progress Notes (Addendum)
PROGRESS NOTE    Johnny [name]  WRU:[id number] DOB: [birth year] DOA: 03/04/2021 PCP: Merri Brunette, MD    Brief Narrative:  This 87 years old male with PMH significant for chronic renal disease stage IIIb, type 2 diabetes, hypertension, aortic stenosis s/p valve replacement in 2014, coronary artery disease s/p CABG, GERD, gout and dyslipidemia presented in the ED with complaints of progressive weakness.  Patient reports having right foot great toe ulceration for about a month associated with pain and redness.  His wound has started draining with purulent discharge.  He also reports poor appetite and generalized weakness.  Right foot x-ray showed soft tissue edema and defect consistent with ulceration. cortical erosion in the interphalangeal joint may represent osteomyelitis. Patient is started on IV antibiotics,  Podiatry was consulted,  Patient underwent right great toe amputation on 8/27.  Patient is cleared from podiatry to be discharged on oral antibiotics. Patient is very weak and deconditioned.  PT recommended skilled nursing facility..  Assessment & Plan:   Principal Problem:   Osteomyelitis of great toe of right foot (HCC) Active Problems:   Essential hypertension   Gout   GERD without esophagitis   Coronary artery disease involving native heart without angina pectoris   Acute renal failure superimposed on stage 3b chronic kidney disease (HCC)   Type 2 diabetes mellitus with stage 3b chronic kidney disease, with long-term current use of insulin (HCC)   Mixed diabetic hyperlipidemia associated with type 2 diabetes mellitus (HCC)  Right foot first toe proximal and distal phalanges osteomyelitis: Presented with purulent discharge.  X-ray findings suspicious for osteomyelitis, Started on IV antibiotics,  Podiatry consulted,   He underwent amputation of great toe on the right 8/27. Antibiotics changed to oral cephalexin.  Stop date 03/15/2021. Adequate pain control with Dilaudid and  oxycodone. Continue wound care, orthopedic shoe per podiatry. PT recommended skilled nursing facility awaiting authorization.  Dyslipidemia: Continue rosuvastatin.  Type 2 diabetes: Continue to have hyperglycemia and uncontrolled fingersticks. Increase Semglee to 12 units. Continue regular insulin sliding scale  AKI on CKD stage III: Renal functions improving, avoid nephrotoxic medications. Creatinine 2.01> 1.91> 1.75. Continue gentle hydration.  Hypertension: Blood pressure continues to remain elevated despite increasing hydralazine to 75mg  3 times daily Continue amlodipine and metoprolol, increase hydralazine 100 mg 3 times daily  GERD :  Continue pantoprazole  Gout : No acute flare.  continue allopurinol  DVT prophylaxis: Lovenox Code Status: Full code Family Communication: No family at bedside Disposition Plan:  Status is: Inpatient  Remains inpatient appropriate because:Inpatient level of care appropriate due to severity of illness  Dispo: The patient is from: Home              Anticipated d/c is to: SNF              Patient currently is not medically stable to d/c.   Difficult to place patient No  Consultants:  Podiatry  Procedures: Right foot great toe amputation.   antimicrobials:   Anti-infectives (From admission, onward)    Start     Dose/Rate Route Frequency Ordered Stop   03/08/21 1045  cephALEXin (KEFLEX) capsule 500 mg        500 mg Oral Every 12 hours 03/08/21 0950     03/05/21 2200  vancomycin (VANCOREADY) IVPB 750 mg/150 mL  Status:  Discontinued        750 mg 150 mL/hr over 60 Minutes Intravenous Daily at bedtime 03/05/21 1436 03/07/21 1132   03/04/21 2047  vancomycin variable dose per unstable renal function (pharmacist dosing)  Status:  Discontinued         Does not apply See admin instructions 03/04/21 2047 03/05/21 1438   03/04/21 1915  vancomycin (VANCOREADY) IVPB 1750 mg/350 mL        1,750 mg 175 mL/hr over 120 Minutes Intravenous  Once  03/04/21 1905 03/04/21 2343   03/04/21 1900  cefTRIAXone (ROCEPHIN) 2 g in sodium chloride 0.9 % 100 mL IVPB  Status:  Discontinued        2 g 200 mL/hr over 30 Minutes Intravenous Every 24 hours 03/04/21 1848 03/07/21 1132        Subjective: Patient was seen and examined at bedside.  Overnight events noted.  Patient is sitting comfortably on the chair , denies any pain.  Objective: Vitals:   03/09/21 1500 03/09/21 1704 03/10/21 0700 03/10/21 1013  BP: (!) 170/78 (!) 182/76 (!) 176/90 (!) 179/91  Pulse: 60 64 76   Resp: 18  19   Temp: 98.2 F (36.8 C)  98.7 F (37.1 C)   TempSrc: Oral  Oral   SpO2: 97%  95%   Weight:      Height:        Intake/Output Summary (Last 24 hours) at 03/10/2021 1319 Last data filed at 03/10/2021 0900 Gross per 24 hour  Intake 480 ml  Output 300 ml  Net 180 ml   Filed Weights   03/04/21 1924 03/05/21 1100  Weight: 92.5 kg 85.7 kg    Examination:  General exam: Appears comfortable, not in any acute distress. Respiratory system: Clear to auscultation. Respiratory effort normal. Cardiovascular system: S1-S2 heard, regular rate and rhythm, no murmur.   Gastrointestinal system: Abdomen is nondistended, soft and nontender. No organomegaly or masses felt. Normal bowel sounds heard. Central nervous system: Alert and oriented. No focal neurological deficits. Extremities: Right great toe amputation noted, no drainage no blood. Skin: No rashes, lesions or ulcers Psychiatry: Judgement and insight appear normal. Mood & affect appropriate.     Data Reviewed: I have personally reviewed following labs and imaging studies  CBC: Recent Labs  Lab 03/04/21 0745 03/05/21 0152 03/06/21 0555 03/07/21 0009  WBC 10.4 11.1* 10.2 9.5  NEUTROABS  --  7.4 6.6  --   HGB 15.4 15.8 15.7 15.7  HCT 47.4 48.9 46.8 46.9  MCV 89.3 89.4 87.8 87.0  PLT 239 242 224 240   Basic Metabolic Panel: Recent Labs  Lab 03/04/21 0745 03/05/21 0152 03/06/21 0335  03/07/21 0009 03/09/21 0332  NA 135 138 138 138 135  K 4.2 4.2 3.9 3.9 4.5  CL 100 104 107 104 101  CO2 26 24 23 26 24   GLUCOSE 183* 111* 127* 178* 241*  BUN 26* 22 26* 27* 22  CREATININE 2.41* 1.95* 2.09* 1.91* 1.75*  CALCIUM 9.9 10.3 9.8 10.0 10.7*  MG  --  2.1  --   --   --    GFR: Estimated Creatinine Clearance: 32.3 mL/min (A) (by C-G formula based on SCr of 1.75 mg/dL (H)). Liver Function Tests: Recent Labs  Lab 03/05/21 0152  AST 31  31  ALT 25  26  ALKPHOS 62  62  BILITOT 1.0  1.0  PROT 6.9  7.0  ALBUMIN 3.3*  3.4*   No results for input(s): LIPASE, AMYLASE in the last 168 hours. No results for input(s): AMMONIA in the last 168 hours. Coagulation Profile: No results for input(s): INR, PROTIME in the last 168 hours. Cardiac  Enzymes: No results for input(s): CKTOTAL, CKMB, CKMBINDEX, TROPONINI in the last 168 hours. BNP (last 3 results) No results for input(s): PROBNP in the last 8760 hours. HbA1C: No results for input(s): HGBA1C in the last 72 hours. CBG: Recent Labs  Lab 03/09/21 1148 03/09/21 1544 03/09/21 2034 03/10/21 0626 03/10/21 1153  GLUCAP 271* 233* 177* 241* 216*   Lipid Profile: No results for input(s): CHOL, HDL, LDLCALC, TRIG, CHOLHDL, LDLDIRECT in the last 72 hours. Thyroid Function Tests: No results for input(s): TSH, T4TOTAL, FREET4, T3FREE, THYROIDAB in the last 72 hours. Anemia Panel: No results for input(s): VITAMINB12, FOLATE, FERRITIN, TIBC, IRON, RETICCTPCT in the last 72 hours. Sepsis Labs: Recent Labs  Lab 03/05/21 0151  LATICACIDVEN 1.3    Recent Results (from the past 240 hour(s))  Culture, blood (routine x 2)     Status: None   Collection Time: 03/04/21  1:45 AM   Specimen: BLOOD  Result Value Ref Range Status   Specimen Description BLOOD RIGHT HAND  Final   Special Requests   Final    BOTTLES DRAWN AEROBIC AND ANAEROBIC Blood Culture results may not be optimal due to an inadequate volume of blood received in  culture bottles   Culture   Final    NO GROWTH 5 DAYS Performed at [facility], [address] [place], [place] [address]    Report Status 03/10/2021 FINAL  Final  Culture, blood (routine x 2)     Status: None   Collection Time: 03/04/21  3:50 AM   Specimen: BLOOD  Result Value Ref Range Status   Specimen Description BLOOD SITE NOT SPECIFIED  Final   Special Requests   Final    BOTTLES DRAWN AEROBIC AND ANAEROBIC Blood Culture adequate volume   Culture   Final    NO GROWTH 5 DAYS Performed at [facility], [address], [place], [place] [address]    Report Status 03/10/2021 FINAL  Final  Resp Panel by RT-PCR (Flu A&B, Covid) Nasopharyngeal Swab     Status: None   Collection Time: 03/04/21  7:27 PM   Specimen: Nasopharyngeal Swab; Nasopharyngeal(NP) swabs in vial transport medium  Result Value Ref Range Status   SARS Coronavirus 2 by RT PCR NEGATIVE NEGATIVE Final    Comment: (NOTE) SARS-CoV-2 target nucleic acids are NOT DETECTED.  The SARS-CoV-2 RNA is generally detectable in upper respiratory specimens during the acute phase of infection. The lowest concentration of SARS-CoV-2 viral copies this assay can detect is 138 copies/mL. A negative result does not preclude SARS-Cov-2 infection and should not be used as the sole basis for treatment or other patient management decisions. A negative result may occur with  improper specimen collection/handling, submission of specimen other than nasopharyngeal swab, presence of viral mutation(s) within the areas targeted by this assay, and inadequate number of viral copies(<138 copies/mL). A negative result must be combined with clinical observations, patient history, and epidemiological information. The expected result is Negative.  Fact Sheet for Patients:  BloggerCourse.com  Fact Sheet for Healthcare Providers:  SeriousBroker.it  This test is no t yet approved or  cleared by the [place] FDA and  has been authorized for detection and/or diagnosis of SARS-CoV-2 by FDA under an Emergency Use Authorization (EUA). This EUA will remain  in effect (meaning this test can be used) for the duration of the COVID-19 declaration under Section 564(b)(1) of the Act, 21 U.S.C.section 360bbb-3(b)(1), unless the authorization is terminated  or revoked sooner.  Influenza A by PCR NEGATIVE NEGATIVE Final   Influenza B by PCR NEGATIVE NEGATIVE Final    Comment: (NOTE) The Xpert Xpress SARS-CoV-2/FLU/RSV plus assay is intended as an aid in the diagnosis of influenza from Nasopharyngeal swab specimens and should not be used as a sole basis for treatment. Nasal washings and aspirates are unacceptable for Xpert Xpress SARS-CoV-2/FLU/RSV testing.  Fact Sheet for Patients: BloggerCourse.com  Fact Sheet for Healthcare Providers: SeriousBroker.it  This test is not yet approved or cleared by the [place] FDA and has been authorized for detection and/or diagnosis of SARS-CoV-2 by FDA under an Emergency Use Authorization (EUA). This EUA will remain in effect (meaning this test can be used) for the duration of the COVID-19 declaration under Section 564(b)(1) of the Act, 21 U.S.C. section 360bbb-3(b)(1), unless the authorization is terminated or revoked.  Performed at [facility], [address], [place], [place] [address]   Surgical pcr screen     Status: None   Collection Time: 03/06/21 11:28 AM   Specimen: Nasal Mucosa; Nasal Swab  Result Value Ref Range Status   MRSA, PCR NEGATIVE NEGATIVE Final   Staphylococcus aureus NEGATIVE NEGATIVE Final    Comment: (NOTE) The Xpert SA Assay (FDA approved for NASAL specimens in patients 32 years of age and older), is one component of a comprehensive surveillance program. It is not intended to diagnose infection nor to guide or monitor  treatment. Performed at [facility], [address], [place], [place] [address]     Radiology Studies: No results found.   Scheduled Meds:  allopurinol  200 mg Oral QHS   amLODipine  10 mg Oral Daily   aspirin  81 mg Oral Daily   cephALEXin  500 mg Oral Q12H   docusate sodium  100 mg Oral BID   furosemide  40 mg Oral Daily   hydrALAZINE  100 mg Oral TID   insulin aspart  0-9 Units Subcutaneous TID WC   insulin glargine-yfgn  12 Units Subcutaneous QHS   metoprolol tartrate  50 mg Oral BID   multivitamin with minerals  1 tablet Oral Daily   pantoprazole  40 mg Oral Daily   rosuvastatin  10 mg Oral Daily   senna  1 tablet Oral Daily   zinc sulfate  220 mg Oral Daily   Continuous Infusions:   LOS: 6 days    Time spent: 35 mins    Jaleesa Cervi, MD [facility]   If 7PM-7AM, please contact night-coverage

## 2021-03-11 DIAGNOSIS — R2681 Unsteadiness on feet: Secondary | ICD-10-CM | POA: Diagnosis not present

## 2021-03-11 DIAGNOSIS — S98119A Complete traumatic amputation of unspecified great toe, initial encounter: Secondary | ICD-10-CM | POA: Diagnosis not present

## 2021-03-11 DIAGNOSIS — Z89411 Acquired absence of right great toe: Secondary | ICD-10-CM | POA: Diagnosis not present

## 2021-03-11 DIAGNOSIS — I2510 Atherosclerotic heart disease of native coronary artery without angina pectoris: Secondary | ICD-10-CM | POA: Diagnosis not present

## 2021-03-11 DIAGNOSIS — R262 Difficulty in walking, not elsewhere classified: Secondary | ICD-10-CM | POA: Diagnosis not present

## 2021-03-11 DIAGNOSIS — Z8669 Personal history of other diseases of the nervous system and sense organs: Secondary | ICD-10-CM | POA: Diagnosis not present

## 2021-03-11 DIAGNOSIS — N183 Chronic kidney disease, stage 3 unspecified: Secondary | ICD-10-CM | POA: Diagnosis not present

## 2021-03-11 DIAGNOSIS — Z7401 Bed confinement status: Secondary | ICD-10-CM | POA: Diagnosis not present

## 2021-03-11 DIAGNOSIS — R1312 Dysphagia, oropharyngeal phase: Secondary | ICD-10-CM | POA: Diagnosis not present

## 2021-03-11 DIAGNOSIS — Z794 Long term (current) use of insulin: Secondary | ICD-10-CM | POA: Diagnosis not present

## 2021-03-11 DIAGNOSIS — N179 Acute kidney failure, unspecified: Secondary | ICD-10-CM | POA: Diagnosis not present

## 2021-03-11 DIAGNOSIS — M869 Osteomyelitis, unspecified: Secondary | ICD-10-CM | POA: Diagnosis not present

## 2021-03-11 DIAGNOSIS — H3530 Unspecified macular degeneration: Secondary | ICD-10-CM | POA: Diagnosis not present

## 2021-03-11 DIAGNOSIS — N1832 Chronic kidney disease, stage 3b: Secondary | ICD-10-CM | POA: Diagnosis not present

## 2021-03-11 DIAGNOSIS — J069 Acute upper respiratory infection, unspecified: Secondary | ICD-10-CM | POA: Diagnosis not present

## 2021-03-11 DIAGNOSIS — R600 Localized edema: Secondary | ICD-10-CM | POA: Diagnosis not present

## 2021-03-11 DIAGNOSIS — R1310 Dysphagia, unspecified: Secondary | ICD-10-CM | POA: Diagnosis not present

## 2021-03-11 DIAGNOSIS — K5901 Slow transit constipation: Secondary | ICD-10-CM | POA: Diagnosis not present

## 2021-03-11 DIAGNOSIS — Z8673 Personal history of transient ischemic attack (TIA), and cerebral infarction without residual deficits: Secondary | ICD-10-CM | POA: Diagnosis not present

## 2021-03-11 DIAGNOSIS — E1122 Type 2 diabetes mellitus with diabetic chronic kidney disease: Secondary | ICD-10-CM | POA: Diagnosis not present

## 2021-03-11 DIAGNOSIS — I10 Essential (primary) hypertension: Secondary | ICD-10-CM | POA: Diagnosis not present

## 2021-03-11 DIAGNOSIS — R531 Weakness: Secondary | ICD-10-CM | POA: Diagnosis not present

## 2021-03-11 DIAGNOSIS — K219 Gastro-esophageal reflux disease without esophagitis: Secondary | ICD-10-CM | POA: Diagnosis not present

## 2021-03-11 DIAGNOSIS — E119 Type 2 diabetes mellitus without complications: Secondary | ICD-10-CM | POA: Diagnosis not present

## 2021-03-11 DIAGNOSIS — Z8739 Personal history of other diseases of the musculoskeletal system and connective tissue: Secondary | ICD-10-CM | POA: Diagnosis not present

## 2021-03-11 DIAGNOSIS — M6281 Muscle weakness (generalized): Secondary | ICD-10-CM | POA: Diagnosis not present

## 2021-03-11 LAB — CBC
HCT: 47.3 % (ref 39.0–52.0)
Hemoglobin: 16.1 g/dL (ref 13.0–17.0)
MCH: 29.3 pg (ref 26.0–34.0)
MCHC: 34 g/dL (ref 30.0–36.0)
MCV: 86 fL (ref 80.0–100.0)
Platelets: 261 10*3/uL (ref 150–400)
RBC: 5.5 MIL/uL (ref 4.22–5.81)
RDW: 13.6 % (ref 11.5–15.5)
WBC: 10.3 10*3/uL (ref 4.0–10.5)
nRBC: 0 % (ref 0.0–0.2)

## 2021-03-11 LAB — GLUCOSE, CAPILLARY
Glucose-Capillary: 183 mg/dL — ABNORMAL HIGH (ref 70–99)
Glucose-Capillary: 169 mg/dL — ABNORMAL HIGH (ref 70–99)

## 2021-03-11 LAB — BASIC METABOLIC PANEL
Anion gap: 6 (ref 5–15)
BUN: 34 mg/dL — ABNORMAL HIGH (ref 8–23)
CO2: 26 mmol/L (ref 22–32)
Calcium: 10.4 mg/dL — ABNORMAL HIGH (ref 8.9–10.3)
Chloride: 103 mmol/L (ref 98–111)
Creatinine, Ser: 2.14 mg/dL — ABNORMAL HIGH (ref 0.61–1.24)
GFR, Estimated: 29 mL/min — ABNORMAL LOW (ref >60)
Glucose, Bld: 188 mg/dL — ABNORMAL HIGH (ref 70–99)
Potassium: 4.2 mmol/L (ref 3.5–5.1)
Sodium: 135 mmol/L (ref 135–145)

## 2021-03-11 LAB — PHOSPHORUS: Phosphorus: 3.5 mg/dL (ref 2.5–4.6)

## 2021-03-11 LAB — MAGNESIUM: Magnesium: 2.2 mg/dL (ref 1.7–2.4)

## 2021-03-11 MED ORDER — CEPHALEXIN 500 MG PO CAPS: 500.0000 mg | ORAL_CAPSULE | Freq: Two times a day (BID) | ORAL | Status: AC

## 2021-03-11 MED ORDER — HYDRALAZINE HCL 100 MG PO TABS: 100.0000 mg | ORAL_TABLET | Freq: Three times a day (TID) | ORAL | Status: DC

## 2021-03-11 MED ORDER — ADULT MULTIVITAMIN W/MINERALS CH: 1.0000 | ORAL_TABLET | Freq: Every day | ORAL | Status: DC

## 2021-03-11 MED ORDER — ACETAMINOPHEN 325 MG PO TABS: 650.0000 mg | ORAL_TABLET | Freq: Four times a day (QID) | ORAL | Status: DC | PRN

## 2021-03-11 MED ORDER — AMLODIPINE BESYLATE 10 MG PO TABS: 10.0000 mg | ORAL_TABLET | Freq: Every day | ORAL | Status: DC

## 2021-03-11 NOTE — Progress Notes (Signed)
Attempt to call report to facility and no answer at this time; will retry shortly.  

## 2021-03-11 NOTE — Discharge Summary (Signed)
Physician Discharge Summary  Johnny [name] JJO:[id number] DOB: May 16, 1933 DOA: 03/04/2021  PCP: Merri Brunette, MD  Admit date: 03/04/2021 Discharge date: 03/11/2021  Admitted From: Home Disposition: SNF  Recommendations for Outpatient Follow-up:  Follow up with PCP in 1 week Follow up with podiatry in 1 week Recheck BMP in 3-5 days to ensure continued stability of creatinine Please follow up on the following pending results: None   Discharge Condition: Stable CODE STATUS: Full code Diet recommendation: Regular diet/carb modified  Brief/Interim Summary:  Admission HPI written by Marinda Elk, MD   HPI:     87 year old male with past medical history of chronic kidney disease stage IIIb, insulin-dependent diabetes mellitus type 2, hypertension, aortic stenosis status post aortic valve replacement (05/2013), coronary artery disease status post CABG (2 vessel, 2014), gastroesophageal reflux disease, gout, macular degeneration, hyperlipidemia who presents to [facility] emergency department due to concerns for progressive weakness.  Patient is a somewhat poor historian therefore majority the history has been obtained from the wife via phone conversation.  Portions of the history have additionally been obtained from the patient himself.  Wife explains that approximately 1 month ago he began to develop an ulceration over the right great toe.  Over the course the next several weeks this became associated with increasing pain swelling and redness of the area.  Approximately 1 week ago patient began to experience generalized weakness which resulted in the patient falling out of bed without loss of consciousness at that time.   Continued to experience progressively worsening generalized weakness with associated poor appetite.  Patient additionally began to develop worsening pain of the right foot, moderate intensity, sharp in quality and worse with weightbearing.  This is  associated with increasing purulence and foul smell of the foot.   Wife reports that patient's weakness was so severe the evening of 8/23 that he was having difficulty sitting up in his chair at dinner and had to be fed by his wife.  The following morning on 8/24 the patient was brought into [facility] emergency department for evaluation   Upon evaluation in the emergency department, patient was found to have a an elevated creatinine of 2.41, up from his baseline of approximately 1.8.  Patient was found to have clinical evidence of dehydration including dry mucous membranes.  Patient was found to have an ulceration of the dorsal surface of the great toe of the right foot.  An x-ray was performed revealing soft tissue swelling and possible osteomyelitis of the interphalangeal joint.  Patient was administered doses of intravenous vancomycin and ceftriaxone for suspected osteomyelitis.  The hospitalist group was then called to assess the patient for admission to the hospital.  Hospital course:  Osteomyelitis of right first toe Present on admission with purulent discharge. X-ray concerning for osteomyelitis. Patient started on empiric Vancomycin/Ceftriaxone. Podiatry consulted and performed amputation of right great toe on 8/27 with recommendations to continue antibiotics post-op. Patient's pain is adequately controlled without the need for narcotics at time of discharge. Patient transitioned to Keflex post-op and will continue on discharge to complete course on 9/4.  Hyperlipidemia Continue Crestor  Diabetes mellitus, type 2 Continue home regimen of Lantus and humalog  AKI on CKD stage IIIb Baseline creatinine of about 2. Peak creatinine of 2.4. Resolved with Iv fluids.  Primary hypertension Patient with uncontrolled blood pressure inpatient. Patient's regimen adjusted to amlodipine 10 mg daily and hydralazine 100 mg TID. Continue metoprolol.  GERD Continue omeprazole  Gout  Stable.  Continue allopurinol  Blindness Noted.  Discharge Diagnoses:  Principal Problem:   Osteomyelitis of great toe of right foot (HCC) Active Problems:   Essential hypertension   Gout   GERD without esophagitis   Coronary artery disease involving native heart without angina pectoris   Acute renal failure superimposed on stage 3b chronic kidney disease (HCC)   Type 2 diabetes mellitus with stage 3b chronic kidney disease, with long-term current use of insulin (HCC)   Mixed diabetic hyperlipidemia associated with type 2 diabetes mellitus [facility])    Discharge Instructions  Discharge Instructions     Diet - low sodium heart healthy   Complete by: As directed    Discharge wound care:   Complete by: As directed    Wound care to right great toe:  Wash foot with soap and water, dry thoroughly between toes. Apply xeroform gauze to ulcer, top with dry gauze and secure with conform bandaging/paper tape. Change daily.      Allergies as of 03/11/2021       Reactions   Glyxambi [empagliflozin-linagliptin] Other (See Comments)   Lethargy, slurring speaking.    Heparin    Other reaction(s): Heparin-Induced Thrombocytopenia        Medication List     TAKE these medications    acetaminophen 325 MG tablet Commonly known as: TYLENOL Take 2 tablets (650 mg total) by mouth every 6 (six) hours as needed for mild pain (or Fever >/= 101).   allopurinol 100 MG tablet Commonly known as: ZYLOPRIM Take 200 mg by mouth at bedtime.   amLODipine 10 MG tablet Commonly known as: NORVASC Take 1 tablet (10 mg total) by mouth daily. What changed:  medication strength how much to take   aspirin 81 MG chewable tablet Chew 81 mg by mouth daily.   cephALEXin 500 MG capsule Commonly known as: KEFLEX Take 1 capsule (500 mg total) by mouth 2 (two) times daily for 5 days.   docusate sodium 100 MG capsule Commonly known as: COLACE Take 100 mg by mouth 2 (two) times daily.   furosemide 40 MG  tablet Commonly known as: LASIX Take 40 mg by mouth daily.   hydrALAZINE 100 MG tablet Commonly known as: APRESOLINE Take 1 tablet (100 mg total) by mouth 3 (three) times daily. What changed:  medication strength how much to take   insulin glargine 100 UNIT/ML injection Commonly known as: LANTUS Inject 0.35 mLs (35 Units total) into the skin at bedtime. What changed: how much to take   insulin lispro 100 UNIT/ML injection Commonly known as: HUMALOG Inject 10-16 Units into the skin 3 (three) times daily before meals. Below 200 units = 10units 200-250=12 units 250-300=14units Over 300=16units   metoprolol tartrate 50 MG tablet Commonly known as: LOPRESSOR Take 50 mg by mouth 2 (two) times daily.   multivitamin with minerals Tabs tablet Take 1 tablet by mouth daily.   mupirocin ointment 2 % Commonly known as: BACTROBAN Apply 1 application topically 2 (two) times daily.   Omeprazole-Sodium Bicarbonate 20-1100 MG Caps capsule Commonly known as: ZEGERID Take 1 capsule by mouth daily before breakfast.   rosuvastatin 10 MG tablet Commonly known as: CRESTOR Take 10 mg by mouth daily.   senna 8.6 MG Tabs tablet Commonly known as: SENOKOT Take 1 tablet by mouth daily.   zinc gluconate 50 MG tablet Take 50 mg by mouth daily.               Discharge Care Instructions  (From  admission, onward)           Start     Ordered   03/11/21 0000  Discharge wound care:       Comments: Wound care to right great toe:  Wash foot with soap and water, dry thoroughly between toes. Apply xeroform gauze to ulcer, top with dry gauze and secure with conform bandaging/paper tape. Change daily.   03/11/21 9150            Follow-up Information     Candelaria Stagers, DPM. Schedule an appointment as soon as possible for a visit in 1 week(s).   Specialty: Podiatry Why: For hospital follow-up, For wound re-check Contact information: [address] [place]  [address] [phone]         Merri Brunette, MD Follow up in 1 week(s).   Specialty: Internal Medicine Why: For hospital follow-up Contact information: [address] [place] [address] [phone]                Allergies  Allergen Reactions   Glyxambi [Empagliflozin-Linagliptin] Other (See Comments)    Lethargy, slurring speaking.    Heparin     Other reaction(s): Heparin-Induced Thrombocytopenia    Consultations: Podiatry   Procedures/Studies: CT HEAD WO CONTRAST ( )  Result Date: 03/11/2021 CLINICAL DATA:  Encephalopathy EXAM: CT HEAD WITHOUT CONTRAST TECHNIQUE: Contiguous axial images were obtained from the base of the skull through the vertex without intravenous contrast. COMPARISON:  03/04/2021 FINDINGS: Brain: There is no mass, hemorrhage or extra-axial collection. There is generalized atrophy without lobar predilection. Hypodensity of the white matter is most commonly associated with chronic microvascular disease. Vascular: Atherosclerotic calcification of the vertebral and internal carotid arteries at the skull base. No abnormal hyperdensity of the major intracranial arteries or dural venous sinuses. Skull: The visualized skull base, calvarium and extracranial soft tissues are normal. Sinuses/Orbits: No fluid levels or advanced mucosal thickening of the visualized paranasal sinuses. No mastoid or middle ear effusion. The orbits are normal. IMPRESSION: Generalized atrophy and chronic microvascular ischemia without acute intracranial abnormality. Electronically Signed   By: Deatra Robinson M.D.   On: 03/11/2021 00:40   CT Head Wo Contrast  Result Date: 03/04/2021 CLINICAL DATA:  Increasing weakness EXAM: CT HEAD WITHOUT CONTRAST TECHNIQUE: Contiguous axial images were obtained from the base of the skull through the vertex without intravenous contrast. COMPARISON:  04/25/2017 FINDINGS: Brain: No evidence of acute infarction, hemorrhage, hydrocephalus,  extra-axial collection or mass lesion/mass effect. Chronic atrophic and ischemic changes are noted similar to that seen on the prior exam. Vascular: No hyperdense vessel or unexpected calcification. Skull: Normal. Negative for fracture or focal lesion. Sinuses/Orbits: Postsurgical changes in the left orbit are noted stable from the prior exam. Other: None. IMPRESSION: Chronic atrophic and ischemic changes without acute abnormality. Electronically Signed   By: Alcide Clever M.D.   On: 03/04/2021 20:25   MR TOES RIGHT WO CONTRAST  Result Date: 03/05/2021 CLINICAL DATA:  Foot wound with evidence of bony destruction in the distal aspect of the first proximal phalanx. EXAM: MRI OF THE RIGHT TOES WITHOUT CONTRAST TECHNIQUE: Multiplanar, multisequence MR imaging of the right forefoot was performed. No intravenous contrast was administered. COMPARISON:  Plain film from the previous day. FINDINGS: Bones/Joint/Cartilage In the first proximal and distal phalanges, there are changes of bony edema as well as cortical breakdown in the distal aspect of the first proximal phalanx similar to that seen on prior plain film examination. Given the overlying soft  tissue wound and destructive changes seen on plain film these findings are most consistent with osteomyelitis. No other area of bony edema is identified. Ligaments Within normal limits as visualized. Muscles and Tendons Visualized musculature appears within normal limits. Soft tissues Soft tissue swelling is noted as well as focal soft tissue wound near the first interphalangeal joint. No focal abscess is seen. Considerable dorsal soft tissue swelling is noted. IMPRESSION: Bony edematous changes within the first proximal and distal phalanges with adjacent soft tissue ulcer most consistent with osteomyelitis. Soft tissue swelling is noted in the dorsal aspect of the forefoot without discrete abscess. Electronically Signed   By: Alcide Clever M.D.   On: 03/05/2021 01:49   DG  Foot Complete Right  Result Date: 03/07/2021 CLINICAL DATA:  Post amputation EXAM: RIGHT FOOT COMPLETE - 3+ VIEW COMPARISON:  03/04/2021 FINDINGS: Interval amputation across the first MTP joint. Distal skin staples. Small amount of subcutaneous gas at the surgical site. No radiodense foreign body. Diffuse osteopenia. No fracture or other acute findings. IMPRESSION: Postop changes of first MTP amputation without apparent complication. Electronically Signed   By: Corlis Leak M.D.   On: 03/07/2021 10:28   DG Toe Great Right  Result Date: 03/04/2021 CLINICAL DATA:  Wound to the right first toe.  Looks infected. EXAM: RIGHT GREAT TOE COMPARISON:  None. FINDINGS: Diffuse bone demineralization. Degenerative changes in the interphalangeal joint, first metatarsal phalangeal joint, and first tarsometatarsal joint. Diffuse soft tissue swelling of the right first toe with evidence of focal skin ulceration along the anteromedial aspect. Suggestion of focal periarticular cortical erosion along the lateral base of the distal phalanx. This could indicate osteomyelitis or arthritis. No acute fracture or dislocation. IMPRESSION: Soft tissue swelling and soft tissue defect consistent with ulceration. Periarticular cortical erosion at the interphalangeal joint may represent osteomyelitis or arthritis. Degenerative changes and diffuse demineralization. Electronically Signed   By: Burman Nieves M.D.   On: 03/04/2021 17:46     Subjective: No issues this morning. Pain is controlled without analgesics as this time.  Discharge Exam: Vitals:   03/10/21 2214 03/11/21 0802  BP: (!) 156/82 (!) 154/80  Pulse: 77 71  Resp: 17 18  Temp: 97.8 F (36.6 C) 98 F (36.7 C)  SpO2: 99% 100%   Vitals:   03/10/21 1013 03/10/21 1500 03/10/21 2214 03/11/21 0802  BP: (!) 179/91 (!) 177/78 (!) 156/82 (!) 154/80  Pulse:  66 77 71  Resp:  Temp:  97.7 F (36.5 C) 97.8 F (36.6 C) 98 F (36.7 C)  TempSrc:  Oral Oral    SpO2:  98% 99% 100%  Weight:      Height:        General exam: Appears calm and comfortable Respiratory system: Clear to auscultation. Respiratory effort normal. Cardiovascular system: S1 & S2 heard, RRR. No murmurs, rubs, gallops or clicks. Gastrointestinal system: Abdomen is nondistended, soft and nontender. No organomegaly or masses felt. Normal bowel sounds heard. Central nervous system: Alert and oriented. No focal neurological deficits. Musculoskeletal: No edema. No calf tenderness. Right foot with clean gauze wrap Skin: No cyanosis. No rashes Psychiatry: Judgement and insight appear normal. Mood & affect appropriate.    The results of significant diagnostics from this hospitalization (including imaging, microbiology, ancillary and laboratory) are listed below for reference.     Microbiology: Recent Results (from the past 240 hour(s))  Culture, blood (routine x 2)     Status: None   Collection Time: 03/04/21  1:45 AM   Specimen: BLOOD  Result Value Ref Range Status   Specimen Description BLOOD RIGHT HAND  Final   Special Requests   Final    BOTTLES DRAWN AEROBIC AND ANAEROBIC Blood Culture results may not be optimal due to an inadequate volume of blood received in culture bottles   Culture   Final    NO GROWTH 5 DAYS Performed at [facility], [address], [place], [place] [address]    Report Status 03/10/2021 FINAL  Final  Culture, blood (routine x 2)     Status: None   Collection Time: 03/04/21  3:50 AM   Specimen: BLOOD  Result Value Ref Range Status   Specimen Description BLOOD SITE NOT SPECIFIED  Final   Special Requests   Final    BOTTLES DRAWN AEROBIC AND ANAEROBIC Blood Culture adequate volume   Culture   Final    NO GROWTH 5 DAYS Performed at [facility], [address], [place], [place] [address]    Report Status 03/10/2021 FINAL  Final  Resp Panel by RT-PCR (Flu A&B, Covid) Nasopharyngeal Swab     Status: None   Collection Time: 03/04/21   7:27 PM   Specimen: Nasopharyngeal Swab; Nasopharyngeal(NP) swabs in vial transport medium  Result Value Ref Range Status   SARS Coronavirus 2 by RT PCR NEGATIVE NEGATIVE Final    Comment: (NOTE) SARS-CoV-2 target nucleic acids are NOT DETECTED.  The SARS-CoV-2 RNA is generally detectable in upper respiratory specimens during the acute phase of infection. The lowest concentration of SARS-CoV-2 viral copies this assay can detect is 138 copies/mL. A negative result does not preclude SARS-Cov-2 infection and should not be used as the sole basis for treatment or other patient management decisions. A negative result may occur with  improper specimen collection/handling, submission of specimen other than nasopharyngeal swab, presence of viral mutation(s) within the areas targeted by this assay, and inadequate number of viral copies(<138 copies/mL). A negative result must be combined with clinical observations, patient history, and epidemiological information. The expected result is Negative.  Fact Sheet for Patients:  BloggerCourse.com  Fact Sheet for Healthcare Providers:  SeriousBroker.it  This test is no t yet approved or cleared by the [place] FDA and  has been authorized for detection and/or diagnosis of SARS-CoV-2 by FDA under an Emergency Use Authorization (EUA). This EUA will remain  in effect (meaning this test can be used) for the duration of the COVID-19 declaration under Section 564(b)(1) of the Act, 21 U.S.C.section 360bbb-3(b)(1), unless the authorization is terminated  or revoked sooner.       Influenza A by PCR NEGATIVE NEGATIVE Final   Influenza B by PCR NEGATIVE NEGATIVE Final    Comment: (NOTE) The Xpert Xpress SARS-CoV-2/FLU/RSV plus assay is intended as an aid in the diagnosis of influenza from Nasopharyngeal swab specimens and should not be used as a sole basis for treatment. Nasal washings and aspirates  are unacceptable for Xpert Xpress SARS-CoV-2/FLU/RSV testing.  Fact Sheet for Patients: BloggerCourse.com  Fact Sheet for Healthcare Providers: SeriousBroker.it  This test is not yet approved or cleared by the [place] FDA and has been authorized for detection and/or diagnosis of SARS-CoV-2 by FDA under an Emergency Use Authorization (EUA). This EUA will remain in effect (meaning this test can be used) for the duration of the COVID-19 declaration under Section 564(b)(1) of the Act, 21 U.S.C. section 360bbb-3(b)(1), unless the authorization is terminated or revoked.  Performed at [facility], [address], [place], [place] [address]   Surgical pcr screen     Status: None   Collection Time: 03/06/21 11:28 AM   Specimen: Nasal Mucosa; Nasal Swab  Result Value Ref Range Status   MRSA, PCR NEGATIVE NEGATIVE Final   Staphylococcus aureus NEGATIVE NEGATIVE Final    Comment: (NOTE) The Xpert SA Assay (FDA approved for NASAL specimens in patients 3 years of age and older), is one component of a comprehensive surveillance program. It is not intended to diagnose infection nor to guide or monitor treatment. Performed at [facility], [address] [place], [place] [address]   Resp Panel by RT-PCR (Flu A&B, Covid) Nasopharyngeal Swab     Status: None   Collection Time: 03/10/21  1:49 PM   Specimen: Nasopharyngeal Swab; Nasopharyngeal(NP) swabs in vial transport medium  Result Value Ref Range Status   SARS Coronavirus 2 by RT PCR NEGATIVE NEGATIVE Final    Comment: (NOTE) SARS-CoV-2 target nucleic acids are NOT DETECTED.  The SARS-CoV-2 RNA is generally detectable in upper respiratory specimens during the acute phase of infection. The lowest concentration of SARS-CoV-2 viral copies this assay can detect is 138 copies/mL. A negative result does not preclude SARS-Cov-2 infection and should not be used as the sole  basis for treatment or other patient management decisions. A negative result may occur with  improper specimen collection/handling, submission of specimen other than nasopharyngeal swab, presence of viral mutation(s) within the areas targeted by this assay, and inadequate number of viral copies(<138 copies/mL). A negative result must be combined with clinical observations, patient history, and epidemiological information. The expected result is Negative.  Fact Sheet for Patients:  BloggerCourse.com  Fact Sheet for Healthcare Providers:  SeriousBroker.it  This test is no t yet approved or cleared by the [place] FDA and  has been authorized for detection and/or diagnosis of SARS-CoV-2 by FDA under an Emergency Use Authorization (EUA). This EUA will remain  in effect (meaning this test can be used) for the duration of the COVID-19 declaration under Section 564(b)(1) of the Act, 21 U.S.C.section 360bbb-3(b)(1), unless the authorization is terminated  or revoked sooner.       Influenza A by PCR NEGATIVE NEGATIVE Final   Influenza B by PCR NEGATIVE NEGATIVE Final    Comment: (NOTE) The Xpert Xpress SARS-CoV-2/FLU/RSV plus assay is intended as an aid in the diagnosis of influenza from Nasopharyngeal swab specimens and should not be used as a sole basis for treatment. Nasal washings and aspirates are unacceptable for Xpert Xpress SARS-CoV-2/FLU/RSV testing.  Fact Sheet for Patients: BloggerCourse.com  Fact Sheet for Healthcare Providers: SeriousBroker.it  This test is not yet approved or cleared by the [place] FDA and has been authorized for detection and/or diagnosis of SARS-CoV-2 by FDA under an Emergency Use Authorization (EUA). This EUA will remain in effect (meaning this test can be used) for the duration of the COVID-19 declaration under Section 564(b)(1) of the Act,  21 U.S.C. section 360bbb-3(b)(1), unless the authorization is terminated or revoked.  Performed at [facility], [address] [place], [place] [address]      Labs: BNP (last 3 results) No results for input(s): BNP in the last 8760 hours. Basic Metabolic Panel: Recent Labs  Lab 03/05/21 0152 03/06/21 0335 03/07/21 0009 03/09/21 0332 03/11/21 0106  NA 138 138 138 135 135  K 4.2 3.9 3.9 4.5 4.2  CL 104 107 104 101 103  CO2 24 23 26  24 26  GLUCOSE 111* 127* 178* 241* 188*  BUN 22 26* 27* 22 34*  CREATININE 1.95* 2.09* 1.91* 1.75* 2.14*  CALCIUM 10.3 9.8 10.0 10.7* 10.4*  MG 2.1  --   --   --  2.2  PHOS  --   --   --   --  3.5   Liver Function Tests: Recent Labs  Lab 03/05/21 0152  AST 31  31  ALT 25  26  ALKPHOS 62  62  BILITOT 1.0  1.0  PROT 6.9  7.0  ALBUMIN 3.3*  3.4*   No results for input(s): LIPASE, AMYLASE in the last 168 hours. No results for input(s): AMMONIA in the last 168 hours. CBC: Recent Labs  Lab 03/05/21 0152 03/06/21 0555 03/07/21 0009 03/11/21 0106  WBC 11.1* 10.2 9.5 10.3  NEUTROABS 7.4 6.6  --   --   HGB 15.8 15.7 15.7 16.1  HCT 48.9 46.8 46.9 47.3  MCV 89.4 87.8 87.0 86.0  PLT 242 224 240 261   Cardiac Enzymes: No results for input(s): CKTOTAL, CKMB, CKMBINDEX, TROPONINI in the last 168 hours. BNP: Invalid input(s): POCBNP CBG: Recent Labs  Lab 03/10/21 0626 03/10/21 1153 03/10/21 1559 03/10/21 2028 03/11/21 0759  GLUCAP 241* 216* 233* 199* 169*   D-Dimer No results for input(s): DDIMER in the last 72 hours. Hgb A1c No results for input(s): HGBA1C in the last 72 hours. Lipid Profile No results for input(s): CHOL, HDL, LDLCALC, TRIG, CHOLHDL, LDLDIRECT in the last 72 hours. Thyroid function studies No results for input(s): TSH, T4TOTAL, T3FREE, THYROIDAB in the last 72 hours.  Invalid input(s): FREET3 Anemia work up No results for input(s): VITAMINB12, FOLATE, FERRITIN, TIBC, IRON, RETICCTPCT in the  last 72 hours. Urinalysis    Component Value Date/Time   COLORURINE YELLOW 03/04/2021 1922   APPEARANCEUR CLEAR 03/04/2021 1922   LABSPEC 1.008 03/04/2021 1922   PHURINE 7.0 03/04/2021 1922   GLUCOSEU NEGATIVE 03/04/2021 1922   HGBUR NEGATIVE 03/04/2021 1922   BILIRUBINUR NEGATIVE 03/04/2021 1922   KETONESUR NEGATIVE 03/04/2021 1922   PROTEINUR 100 (A) 03/04/2021 1922   UROBILINOGEN 0.2 01/02/2014 2236   NITRITE NEGATIVE 03/04/2021 1922   LEUKOCYTESUR NEGATIVE 03/04/2021 1922   Sepsis Labs Invalid input(s): PROCALCITONIN,  WBC,  LACTICIDVEN Microbiology Recent Results (from the past 240 hour(s))  Culture, blood (routine x 2)     Status: None   Collection Time: 03/04/21  1:45 AM   Specimen: BLOOD  Result Value Ref Range Status   Specimen Description BLOOD RIGHT HAND  Final   Special Requests   Final    BOTTLES DRAWN AEROBIC AND ANAEROBIC Blood Culture results may not be optimal due to an inadequate volume of blood received in culture bottles   Culture   Final    NO GROWTH 5 DAYS Performed at [facility], [address] [place], [place] [address]    Report Status 03/10/2021 FINAL  Final  Culture, blood (routine x 2)     Status: None   Collection Time: 03/04/21  3:50 AM   Specimen: BLOOD  Result Value Ref Range Status   Specimen Description BLOOD SITE NOT SPECIFIED  Final   Special Requests   Final    BOTTLES DRAWN AEROBIC AND ANAEROBIC Blood Culture adequate volume   Culture   Final    NO GROWTH 5 DAYS Performed at [facility], [address], [place], [place] [address]    Report Status 03/10/2021 FINAL  Final  Resp Panel by RT-PCR (Flu A&B, Covid) Nasopharyngeal Swab     Status: None   Collection Time: 03/04/21  7:27 PM   Specimen: Nasopharyngeal Swab; Nasopharyngeal(NP) swabs in vial transport medium  Result Value Ref Range Status   SARS Coronavirus 2 by RT PCR NEGATIVE NEGATIVE Final    Comment: (NOTE) SARS-CoV-2 target nucleic acids are NOT  DETECTED.  The SARS-CoV-2 RNA is generally detectable in upper respiratory specimens during the acute phase of infection. The lowest concentration of SARS-CoV-2 viral copies this assay can detect is 138 copies/mL. A negative result does not preclude SARS-Cov-2 infection and should not be used as the sole basis for treatment or other patient management decisions. A negative result may occur with  improper specimen collection/handling, submission of specimen other than nasopharyngeal swab, presence of viral mutation(s) within the areas targeted by this assay, and inadequate number of viral copies(<138 copies/mL). A negative result must be combined with clinical observations, patient history, and epidemiological information. The expected result is Negative.  Fact Sheet for Patients:  BloggerCourse.com  Fact Sheet for Healthcare Providers:  SeriousBroker.it  This test is no t yet approved or cleared by the [place] FDA and  has been authorized for detection and/or diagnosis of SARS-CoV-2 by FDA under an Emergency Use Authorization (EUA). This EUA will remain  in effect (meaning this test can be used) for the duration of the COVID-19 declaration under Section 564(b)(1) of the Act, 21 U.S.C.section 360bbb-3(b)(1), unless the authorization is terminated  or revoked sooner.       Influenza A by PCR NEGATIVE NEGATIVE Final   Influenza B by PCR NEGATIVE NEGATIVE Final    Comment: (NOTE) The Xpert Xpress SARS-CoV-2/FLU/RSV plus assay is intended as an aid in the diagnosis of influenza from Nasopharyngeal swab specimens and should not be used as a sole basis for treatment. Nasal washings and aspirates are unacceptable for Xpert Xpress SARS-CoV-2/FLU/RSV testing.  Fact Sheet for Patients: BloggerCourse.com  Fact Sheet for Healthcare Providers: SeriousBroker.it  This test is not yet  approved or cleared by the [place] FDA and has been authorized for detection and/or diagnosis of SARS-CoV-2 by FDA under an Emergency Use Authorization (EUA). This EUA will remain in effect (meaning this test can be used) for the duration of the COVID-19 declaration under Section 564(b)(1) of the Act, 21 U.S.C. section 360bbb-3(b)(1), unless the authorization is terminated or revoked.  Performed at [facility], [address], [place], [place] [address]   Surgical pcr screen     Status: None   Collection Time: 03/06/21 11:28 AM   Specimen: Nasal Mucosa; Nasal Swab  Result Value Ref Range Status   MRSA, PCR NEGATIVE NEGATIVE Final   Staphylococcus aureus NEGATIVE NEGATIVE Final    Comment: (NOTE) The Xpert SA Assay (FDA approved for NASAL specimens in patients 40 years of age and older), is one component of a comprehensive surveillance program. It is not intended to diagnose infection nor to guide or monitor treatment. Performed at [facility], [address], [place], [place] [address]   Resp Panel by RT-PCR (Flu A&B, Covid) Nasopharyngeal Swab     Status: None   Collection Time: 03/10/21  1:49 PM   Specimen: Nasopharyngeal Swab; Nasopharyngeal(NP) swabs in vial transport medium  Result Value Ref Range Status   SARS Coronavirus 2 by RT PCR NEGATIVE NEGATIVE Final    Comment: (NOTE) SARS-CoV-2 target nucleic acids are NOT DETECTED.  The SARS-CoV-2 RNA is generally detectable in upper  respiratory specimens during the acute phase of infection. The lowest concentration of SARS-CoV-2 viral copies this assay can detect is 138 copies/mL. A negative result does not preclude SARS-Cov-2 infection and should not be used as the sole basis for treatment or other patient management decisions. A negative result may occur with  improper specimen collection/handling, submission of specimen other than nasopharyngeal swab, presence of viral mutation(s) within the areas  targeted by this assay, and inadequate number of viral copies(<138 copies/mL). A negative result must be combined with clinical observations, patient history, and epidemiological information. The expected result is Negative.  Fact Sheet for Patients:  BloggerCourse.comhttps://www.fda.gov/media/152166/download  Fact Sheet for Healthcare Providers:  SeriousBroker.ithttps://www.fda.gov/media/152162/download  This test is no t yet approved or cleared by the [place] FDA and  has been authorized for detection and/or diagnosis of SARS-CoV-2 by FDA under an Emergency Use Authorization (EUA). This EUA will remain  in effect (meaning this test can be used) for the duration of the COVID-19 declaration under Section 564(b)(1) of the Act, 21 U.S.C.section 360bbb-3(b)(1), unless the authorization is terminated  or revoked sooner.       Influenza A by PCR NEGATIVE NEGATIVE Final   Influenza B by PCR NEGATIVE NEGATIVE Final    Comment: (NOTE) The Xpert Xpress SARS-CoV-2/FLU/RSV plus assay is intended as an aid in the diagnosis of influenza from Nasopharyngeal swab specimens and should not be used as a sole basis for treatment. Nasal washings and aspirates are unacceptable for Xpert Xpress SARS-CoV-2/FLU/RSV testing.  Fact Sheet for Patients: BloggerCourse.comhttps://www.fda.gov/media/152166/download  Fact Sheet for Healthcare Providers: SeriousBroker.ithttps://www.fda.gov/media/152162/download  This test is not yet approved or cleared by the [place] FDA and has been authorized for detection and/or diagnosis of SARS-CoV-2 by FDA under an Emergency Use Authorization (EUA). This EUA will remain in effect (meaning this test can be used) for the duration of the COVID-19 declaration under Section 564(b)(1) of the Act, 21 U.S.C. section 360bbb-3(b)(1), unless the authorization is terminated or revoked.  Performed at [facility], [address] [place], [place] [address]      Time coordinating discharge: 35  minutes  SIGNED:   Jacquelin Hawkingalph Jaxzen Vanhorn, MD [facility] 03/11/2021, 9:53 AM

## 2021-03-11 NOTE — Progress Notes (Signed)
Report called to facility and given to [facility], LPN. Call back number left if any questions.

## 2021-03-11 NOTE — Progress Notes (Signed)
PTAR at bedside to take patient to facility. AVS forms, demographics and personal belongings given to PTAR. Wife notified by this RN that patient is on way to facility.

## 2021-03-11 NOTE — Discharge Instructions (Signed)
Johnny [name],  You were in the hospital because of a bone infection of your toe (osteomyelitis). You underwent a toe amputation to treat the infection in addition to a course of antibiotics. Please follow-up with your primary and podiatry physicians.

## 2021-03-11 NOTE — Progress Notes (Signed)
Physical Therapy Treatment Patient Details Name: Johnny [name] MRN: [id number] DOB: Feb 14, 1933 Today's Date: 03/11/2021    History of Present Illness Pt is a 87yo male presenting to [facility] ED on 8/24 with complaints of generalized weakness and R great toe ulceration. Imaging showed possible osteomyelitis, s/p R great toe amputation 8/28. PMH: HTN, gout, CAD, acute renal failure (blind), DM, MRSA.    PT Comments    Pt was seen for mobility on side of bed and to sidestep with assistance on RW.  He is handling the gait well with the tactile cue of being on side of bed.  Cues for safety and sequencing are useful, with pt motivation making him an excellent pt.  Pt is still appropriate for SNF, and did return him to bed rather than sit in chair due to potential for EMT's to arrive at any point to transport him to SNF.  Follow as his stay permits for recovery of more independence with mobility, and will expect good effort from pt who is looking forward to seeing his wife and dog.     Follow Up Recommendations  SNF     Equipment Recommendations  Rolling walker with 5" wheels;Wheelchair (measurements PT);Wheelchair cushion (measurements PT)    Recommendations for Other Services       Precautions / Restrictions Precautions Precautions: Fall Precaution Comments: Pt fell out of bed prior to admission, blind Required Braces or Orthoses: Other Brace Other Brace: R post op shoe Restrictions Weight Bearing Restrictions: Yes RUE Weight Bearing: Weight bearing as tolerated RLE Weight Bearing: Weight bearing as tolerated    Mobility  Bed Mobility Overal bed mobility: Needs Assistance Bed Mobility: Supine to Sit;Sit to Supine     Supine to sit: Mod assist Sit to supine: Mod assist   General bed mobility comments: pt moved legs to side of bed and required trunk lift, but reverse was true with return to bed.  Pt does not sequence well to return to R side and get to bed    Transfers Overall  transfer level: Needs assistance   Transfers: Sit to/from Stand Sit to Stand: Mod assist         General transfer comment: hand placement and control of descent wwere emphasized  Ambulation/Gait Ambulation/Gait assistance: Mod assist Gait Distance (Feet): 12 Feet (3 x 4) Assistive device: Rolling walker (2 wheeled) Gait Pattern/deviations: Step-to pattern;Shuffle;Decreased stride length Gait velocity: reduced   General Gait Details: side steps to go up and down bed, but per nsg is due to be picked up from EMT's to go to SNF.  Followed up tx with return to bed to avoid catching nursing with pt up   Stairs             Wheelchair Mobility    Modified Rankin (Stroke Patients Only)       Balance Overall balance assessment: Needs assistance Sitting-balance support: Feet supported Sitting balance-Leahy Scale: Fair     Standing balance support: Bilateral upper extremity supported;During functional activity Standing balance-Leahy Scale: Poor                              Cognition Arousal/Alertness: Awake/alert Behavior During Therapy: WFL for tasks assessed/performed Overall Cognitive Status: Impaired/Different from baseline Area of Impairment: Following commands;Safety/judgement;Awareness;Problem solving                       Following Commands: Follows one step commands inconsistently;Follows one step  commands with increased time Safety/Judgement: Decreased awareness of deficits Awareness: Intellectual Problem Solving: Slow processing;Decreased initiation;Requires verbal cues General Comments: reminders for hand placement and sequencing on walker at bedside, with use of bedrail      Exercises      General Comments General comments (skin integrity, edema, etc.): VSS on RA, no new concerns      Pertinent Vitals/Pain Pain Assessment: Faces Faces Pain Scale: Hurts a little bit Pain Location: hips and back Pain Descriptors / Indicators:  Guarding Pain Intervention(s): Monitored during session;Repositioned    Home Living                      Prior Function            PT Goals (current goals can now be found in the care plan section) Acute Rehab PT Goals Patient Stated Goal: none stated Progress towards PT goals: Progressing toward goals    Frequency    Min 3X/week      PT Plan Current plan remains appropriate    Co-evaluation              AM-PAC PT "6 Clicks" Mobility   Outcome Measure  Help needed turning from your back to your side while in a flat bed without using bedrails?: A Little Help needed moving from lying on your back to sitting on the side of a flat bed without using bedrails?: A Lot Help needed moving to and from a bed to a chair (including a wheelchair)?: A Lot Help needed standing up from a chair using your arms (e.g., wheelchair or bedside chair)?: A Lot Help needed to walk in hospital room?: A Lot Help needed climbing 3-5 steps with a railing? : Total 6 Click Score: 12    End of Session Equipment Utilized During Treatment: Gait belt Activity Tolerance: Patient tolerated treatment well;Patient limited by fatigue Patient left: in bed;with bed alarm set;with call bell/phone within reach Nurse Communication: Mobility status PT Visit Diagnosis: Muscle weakness (generalized) (M62.81);History of falling (Z91.81);Unsteadiness on feet (R26.81);Other abnormalities of gait and mobility (R26.89);Pain Pain - Right/Left:  (both sides) Pain - part of body: Hip (back)     Time: 8469-6295 PT Time Calculation (min) (ACUTE ONLY): 28 min  Charges:  $Gait Training: 8-22 mins $Therapeutic Activity: 8-22 mins             Ivar Drape 03/11/2021, 12:31 PM  Samul Dada, PT MS Acute Rehab Dept. Number: [facility] [id number] and [facility] [phone]

## 2021-03-11 NOTE — TOC Transition Note (Signed)
Transition of Care [facility]) - CM/SW Discharge Note   Patient Details  Name: Johnny [name] MRN: [id number] Date of Birth: [birth year]  [facility] CM/SW Contact:  Ralene Bathe, LCSWA Phone Number: 03/11/2021, 10:35 AM   Clinical Narrative:    Patient will DC to: [facility] Anticipated DC date: 03/11/2021 Family notified: Yes Transport by: Sharin Mons   Per MD patient ready for DC to SNF. RN to call report prior to discharge [phone]. RN, patient, patient's family, and facility notified of DC. Discharge Summary and FL2 sent to facility. DC packet on chart. Ambulance transport will be requested for patient when RN reports that patient is medically ready.   CSW will sign off for now as social work intervention is no longer needed. Please consult [place] again if new needs arise.     Final next level of care: Skilled Nursing Facility Barriers to Discharge: Barriers Resolved   Patient Goals and CMS Choice   CMS Medicare.gov Compare Post Acute Care list provided to:: Patient Represenative (must comment) Choice offered to / list presented to : Spouse  Discharge Placement              Patient [facility] bed at: [facility] Patient to be transferred to facility by: PTAR Name of family member notified: Harlie, Ragle (Spouse)   [phone] Patient and family notified of of transfer: 03/11/21  Discharge Plan and Services                                     Social Determinants of Health (SDOH) Interventions     Readmission Risk Interventions No flowsheet data found.

## 2021-03-11 NOTE — Progress Notes (Signed)
Occupational Therapy Treatment Patient Details Name: Johnny [name] MRN: [id number] DOB: March 30, 1933 Today's Date: 03/11/2021    History of present illness Pt is a 87yo male presenting to [facility] ED on 8/24 with complaints of generalized weakness and R great toe ulceration. Imaging showed possible osteomyelitis, s/p R great toe amputation 8/28. PMH: HTN, gout, CAD, acute renal failure (blind), DM, MRSA.   OT comments  Tige is progressing well, he was fatigued at the start of the session due to his session with PT but motivated to sit EOB to complete grooming tasks. Pt continues to require d mod A for bed mobility. He was set up for grooming tasks given max verbal and tactile cues. Pt required min-mod A overall to progress further towards the head of the bed for better positioning. D/c plan remains appropriate.    Follow Up Recommendations  SNF;Supervision/Assistance - 24 hour    Equipment Recommendations  Other (comment)       Precautions / Restrictions Precautions Precautions: Fall Precaution Comments: Pt fell out of bed prior to admission, blind Required Braces or Orthoses: Other Brace Other Brace: R post op shoe Restrictions Weight Bearing Restrictions: Yes RUE Weight Bearing: Weight bearing as tolerated RLE Weight Bearing: Weight bearing as tolerated       Mobility Bed Mobility Overal bed mobility: Needs Assistance Bed Mobility: Supine to Sit     Supine to sit: Mod assist Sit to supine: Mod assist   General bed mobility comments: Cues for move LEs to edge of bed and to elevate trunk into a seated position.  Pt with jerking movement in sitting.  Cues for hand placement with hand over hand support.    Transfers Overall transfer level: Needs assistance   Transfers: Sit to/from Stand Sit to Stand: Mod assist              Balance Overall balance assessment: Needs assistance Sitting-balance support: Feet supported Sitting balance-Leahy Scale: Fair                                      ADL either performed or assessed with clinical judgement   ADL Overall ADL's : Needs assistance/impaired     Grooming: Set up;Sitting Grooming Details (indicate cue type and reason): set up for all grooming tasks while sitting EOB, verbal cues helpful due to visual impairment                               General ADL Comments: Pt motivated to groom while sitting EOB, set up for all sitting tasks and increased time and verbal cues throughout               Cognition Arousal/Alertness: Awake/alert Behavior During Therapy: [facility] for tasks assessed/performed Overall Cognitive Status: Impaired/Different from baseline Area of Impairment: Following commands;Safety/judgement;Problem solving                       Following Commands: Follows multi-step commands inconsistently Safety/Judgement: Decreased awareness of safety   Problem Solving: Slow processing;Decreased initiation;Difficulty sequencing;Requires verbal cues General Comments: pt pleasant this session              General Comments VSS on RA, no new concerns    Pertinent Vitals/ Pain       Pain Assessment: Faces Faces Pain Scale: Hurts little more Pain Location: generalized wtih  movement Pain Descriptors / Indicators: Grimacing;Discomfort Pain Intervention(s): Limited activity within patient's tolerance;Monitored during session   Frequency  Min 2X/week        Progress Toward Goals  OT Goals(current goals can now be found in the care plan section)  Progress towards OT goals: Progressing toward goals  Acute Rehab OT Goals Patient Stated Goal: to get stronger OT Goal Formulation: With patient Time For Goal Achievement: 03/22/21 Potential to Achieve Goals: Fair  Plan Discharge plan remains appropriate    Co-evaluation                 AM-PAC OT "6 Clicks" Daily Activity     Outcome Measure   Help from another person eating meals?: A  Lot Help from another person taking care of personal grooming?: A Little Help from another person toileting, which includes using toliet, bedpan, or urinal?: A Lot Help from another person bathing (including washing, rinsing, drying)?: A Lot Help from another person to put on and taking off regular upper body clothing?: A Little Help from another person to put on and taking off regular lower body clothing?: A Lot 6 Click Score: 14    End of Session    OT Visit Diagnosis: Unsteadiness on feet (R26.81);Other abnormalities of gait and mobility (R26.89);Muscle weakness (generalized) (M62.81);History of falling (Z91.81);Pain   Activity Tolerance Patient tolerated treatment well   Patient Left in bed;with call bell/phone within reach;with bed alarm set;with family/visitor present   Nurse Communication Mobility status;Precautions;Weight bearing status        Time: 2992-4268 OT Time Calculation (min): 19 min  Charges: OT General Charges $OT Visit: 1 Visit OT Treatments $Self Care/Home Management : 8-22 mins    Deborrah Mabin A Kamesha Herne 03/11/2021, 12:01 PM

## 2021-03-12 ENCOUNTER — Non-Acute Institutional Stay (SKILLED_NURSING_FACILITY): Payer: Medicare Other | Admitting: Internal Medicine

## 2021-03-12 ENCOUNTER — Encounter: Payer: Self-pay | Admitting: Internal Medicine

## 2021-03-12 DIAGNOSIS — N1832 Chronic kidney disease, stage 3b: Secondary | ICD-10-CM

## 2021-03-12 DIAGNOSIS — N179 Acute kidney failure, unspecified: Secondary | ICD-10-CM | POA: Diagnosis not present

## 2021-03-12 DIAGNOSIS — E1122 Type 2 diabetes mellitus with diabetic chronic kidney disease: Secondary | ICD-10-CM

## 2021-03-12 DIAGNOSIS — M869 Osteomyelitis, unspecified: Secondary | ICD-10-CM

## 2021-03-12 DIAGNOSIS — Z794 Long term (current) use of insulin: Secondary | ICD-10-CM | POA: Diagnosis not present

## 2021-03-12 DIAGNOSIS — Z8739 Personal history of other diseases of the musculoskeletal system and connective tissue: Secondary | ICD-10-CM

## 2021-03-12 LAB — GLUCOSE, CAPILLARY: Glucose-Capillary: 219 mg/dL — ABNORMAL HIGH (ref 70–99)

## 2021-03-12 NOTE — Assessment & Plan Note (Signed)
DM with neuro, renal , & vascular complications Glucose range as IP : 111-241 Current A1c:5.2%. Discordance between glucoses & A1c due to ESRD. A1c goal : < 8% No hypoglycemia No change indicated

## 2021-03-12 NOTE — Assessment & Plan Note (Signed)
Creatinine peaked @ 2.14 / GFR nadir 29 ; CKD Stage 4 Current creatinine  1.75/ GFR 37  ; CKD Stage 3b Medication List reviewed. On Allopurinol  200 mg qd. Up to date recommends 50 mg qod.

## 2021-03-12 NOTE — Progress Notes (Signed)
NURSING HOME LOCATION:  [facility] ROOM NUMBER:  [id number]  CODE STATUS:  Full Code  PCP:  Merri Brunette MD  This is a comprehensive admission note to this SNFperformed on this date less than 30 days from date of admission. Included are preadmission medical/surgical history; reconciled medication list; family history; social history and comprehensive review of systems.  Corrections and additions to the records were documented. Comprehensive physical exam was also performed. Additionally a clinical summary was entered for each active diagnosis pertinent to this admission in the Problem List to enhance continuity of care.  HPI: He was hospitalized 8/24 - 03/11/2021 , presenting with progressive weakness.  Approximately  1 month ago he began develop an ulcer over the right great toe.  Over the next several weeks this became associated with increasing pain, swelling, and redness.  1 week PTA he began to experience the generalized weakness which resulted in him falling out of bed without associated loss of consciousness.  The  worsening pain in the right foot was associated with anorexia.Pain was worse with weightbearing.  Purulent, odiferous discharge was described. Weakness progressed to the point that on the evening of 8/23 , he was having difficulty sitting up in his chair at the evening meal and had to be fed by his wife.  The next morning she brought him to the ED for evaluation. AKI superimposed on CKD was documented.  Clinically  he was dehydrated.  Ulceration of the dorsum of the right great toe was present.  X-ray revealed soft tissue swelling and possible osteomyelitis of the interphalangeal joint. He received vancomycin and ceftriaxone for suspected osteomyelitis. Podiatry consulted and performed amputation of the right great toe on 8/27.  Patient's pain was adequately controlled without narcotics.  Antibiotics were transitioned to oral Keflex with completion date of  9/4. Because of the significant weakness ,he was discharged to SNF for rehab.  Past medical and surgical history: Includes history of aortic stenosis, BPH, CAD, history of stroke, diabetes with CKD, GERD, macular degeneration, history of gout, and essential hypertension. Other surgical procedures include cataract extraction, colonic polypectomy, CABG, and history of prostate surgery.  Social history: Nondrinker; former smoker with a 40-pack-year history.  Family history: Noncontributory due to advanced age.   Review of systems: He denies any significant pain at the wound site.  He is alert and oriented.  He was able to give me his appointment date for podiatric follow-up.  He validates some constipation intermittently.  He states he has been told he snores but there is no apnea.  He states his wife feels he is depressed but he denies this. He cannot give me home glucoses; but occasionally his wife gives him OJ because of value.  He states that he has had progressive vision loss for several weeks  in the context of macular degeneration.  Constitutional: No fever, significant weight change Eyes: No redness, discharge, pain ENT/mouth: No nasal congestion, purulent discharge, earache, change in hearing, sore throat  Cardiovascular: No chest pain, palpitations, paroxysmal nocturnal dyspnea, claudication, edema  Respiratory: No cough, sputum production, hemoptysis, DOE  Gastrointestinal: No heartburn, dysphagia, abdominal pain, nausea /vomiting, rectal bleeding, melena Genitourinary: No dysuria, hematuria, pyuria, incontinence, nocturia Dermatologic: No rash, pruritus, change in appearance of skin Neurologic: No dizziness, headache, syncope, seizures, numbness, tingling Psychiatric: No significant insomnia Endocrine: No change in hair/skin/nails, excessive thirst, excessive hunger, excessive urination  Hematologic/lymphatic: No significant bruising, lymphadenopathy, abnormal  bleeding Allergy/immunology: No itchy/watery eyes, significant sneezing, urticaria,  angioedema  Physical exam:  Pertinent or positive findings: As noted he is oriented.  Affect is markedly flat. & facial expression almost blank.  Hair is disheveled and hair is thin over the crown.  He has bilateral ptosis.  He could not count fingers @ 3 feet He has a lower partial & upper plate.  Breath sounds are somewhat decreased.  Pedal pulses are decreased.  Right foot is wrapped and in a protective sandal. There is slight clubbing of the nailbeds.  General appearance: Adequately nourished; no acute distress, increased work of breathing is present.   Lymphatic: No lymphadenopathy about the head, neck, axilla. Eyes: No conjunctival inflammation or lid edema is present. There is no scleral icterus. Ears:  External ear exam shows no significant lesions or deformities.   Nose:  External nasal examination shows no deformity or inflammation. Nasal mucosa are pink and moist without lesions, exudates Neck:  No thyromegaly, masses, tenderness noted.    Heart:  Normal rate and regular rhythm. S1 and S2 normal without gallop, murmur, click, rub.  Lungs: without wheezes, rhonchi, rales, rubs. Abdomen: Bowel sounds are normal.  Abdomen is soft and nontender with no organomegaly, hernias, masses. GU: Deferred  Extremities:  No cyanosis, edema. Neurologic exam: Balance, Rhomberg, finger to nose testing could not be completed due to clinical state Deep tendon reflexes are equal Skin: Warm & dry w/o tenting. No significant rash.  See clinical summary under each active problem in the Problem List with associated updated therapeutic plan

## 2021-03-13 LAB — COMPREHENSIVE METABOLIC PANEL
Calcium: 10.2 (ref 8.7–10.7)
GFR calc Af Amer: 25.16
GFR calc non Af Amer: 21.7
Globulin: 2.4

## 2021-03-13 LAB — BASIC METABOLIC PANEL
BUN: 53 — AB (ref 4–21)
CO2: 24 — AB (ref 13–22)
Chloride: 105 (ref 99–108)
Creatinine: 2.6 — AB (ref 0.6–1.3)
Glucose: 94
Potassium: 4.4 (ref 3.4–5.3)
Sodium: 142 (ref 137–147)

## 2021-03-13 LAB — CBC AND DIFFERENTIAL
HCT: 44 (ref 41–53)
Hemoglobin: 15.1 (ref 13.5–17.5)
Platelets: 258 (ref 150–399)
WBC: 10

## 2021-03-13 LAB — HEPATIC FUNCTION PANEL
ALT: 24 (ref 10–40)
AST: 31 (ref 14–40)
Alkaline Phosphatase: 73 (ref 25–125)

## 2021-03-13 LAB — CBC: RBC: 5.18 — AB (ref 3.87–5.11)

## 2021-03-14 NOTE — Assessment & Plan Note (Signed)
Denies pain @ op site suggesting peripheral neuropathy despite denial of numbness & tingling.

## 2021-03-14 NOTE — Patient Instructions (Signed)
See assessment and plan under each diagnosis in the problem list and acutely for this visit 

## 2021-03-14 NOTE — Assessment & Plan Note (Signed)
No recent gout flare. Allopurinol will be decreased to 50 mg M,W,F & Sun with monitor for any flare with reduced dose.

## 2021-03-17 ENCOUNTER — Ambulatory Visit (INDEPENDENT_AMBULATORY_CARE_PROVIDER_SITE_OTHER): Payer: Medicare Other | Admitting: Podiatry

## 2021-03-17 ENCOUNTER — Other Ambulatory Visit: Payer: Self-pay

## 2021-03-17 DIAGNOSIS — E1122 Type 2 diabetes mellitus with diabetic chronic kidney disease: Secondary | ICD-10-CM | POA: Diagnosis not present

## 2021-03-17 DIAGNOSIS — Z89411 Acquired absence of right great toe: Secondary | ICD-10-CM

## 2021-03-17 DIAGNOSIS — M869 Osteomyelitis, unspecified: Secondary | ICD-10-CM | POA: Diagnosis not present

## 2021-03-17 DIAGNOSIS — Z794 Long term (current) use of insulin: Secondary | ICD-10-CM | POA: Diagnosis not present

## 2021-03-17 DIAGNOSIS — N1832 Chronic kidney disease, stage 3b: Secondary | ICD-10-CM

## 2021-03-19 NOTE — Progress Notes (Signed)
  Subjective:  Patient ID: Johnny [name], male    DOB: [birth year],  MRN: [id number]  Chief Complaint  Patient presents with   Routine Post Op      pov #1 amputation of right great toe 03/07/2021;wound follow up       87 y.o. male returns for post-op check.  Doing well he is here with his wife today  Review of Systems: Negative except as noted in the HPI. Denies N/V/F/Ch.   Objective:  There were no vitals filed for this visit. There is no height or weight on file to calculate BMI. Constitutional Well developed. Well nourished.  Vascular Foot warm and well perfused. Capillary refill normal to all digits.   Neurologic Normal speech. Oriented to person, place, and time. Epicritic sensation to light touch grossly reduced bilaterally.  Dermatologic Skin healing well without signs of infection. Skin edges well coapted without signs of infection.  Orthopedic: Tenderness to palpation noted about the surgical site.   Assessment:   1. History of amputation of right great toe (HCC)   2. Type 2 diabetes mellitus with stage 3b chronic kidney disease, with long-term current use of insulin (HCC)   3. Osteomyelitis of great toe of right foot (HCC)    Plan:  Patient was evaluated and treated and all questions answered.  S/p foot surgery right -Progressing as expected post-operatively. -WB Status: WBAT in surgical shoe -Sutures: Remove at next visit with staples. -Medications: No refills required -Foot redressed.  Return in about 2 weeks (around 03/31/2021) for suture removal.

## 2021-03-24 ENCOUNTER — Non-Acute Institutional Stay (SKILLED_NURSING_FACILITY): Payer: Medicare Other | Admitting: Adult Health

## 2021-03-24 ENCOUNTER — Encounter: Payer: Self-pay | Admitting: Adult Health

## 2021-03-24 DIAGNOSIS — N1832 Chronic kidney disease, stage 3b: Secondary | ICD-10-CM

## 2021-03-24 DIAGNOSIS — R531 Weakness: Secondary | ICD-10-CM | POA: Diagnosis not present

## 2021-03-24 DIAGNOSIS — E1122 Type 2 diabetes mellitus with diabetic chronic kidney disease: Secondary | ICD-10-CM

## 2021-03-24 DIAGNOSIS — I10 Essential (primary) hypertension: Secondary | ICD-10-CM

## 2021-03-24 DIAGNOSIS — R1310 Dysphagia, unspecified: Secondary | ICD-10-CM

## 2021-03-24 DIAGNOSIS — M869 Osteomyelitis, unspecified: Secondary | ICD-10-CM

## 2021-03-24 DIAGNOSIS — Z794 Long term (current) use of insulin: Secondary | ICD-10-CM | POA: Diagnosis not present

## 2021-03-24 NOTE — Progress Notes (Addendum)
Location:  [facility] Room Number: 108 A Place of Service:  SNF (31) Provider:  Kenard Gower, DNP, FNP-BC  Patient Care Team: Merri Brunette, MD as PCP - General (Internal Medicine)  Extended Emergency Contact Information Primary Emergency Contact: Detjen,Jean Address: [address]          [place], [place] [address] [place] Home Phone: [phone] Mobile Phone: [phone] Relation: Spouse  Code Status:  FULL CODE  Goals of care: Advanced Directive information Advanced Directives 03/05/2021  Does Patient Have a Medical Advance Directive? -  Would patient like information on creating a medical advance directive? No - Patient declined     Chief Complaint  Patient presents with   Acute Visit    Tremors, weakness and difficulty swallowing     HPI:  Pt is a 87 y.o. male seen today for an acute visit regarding complaints of tremors, weakness and difficulty swallowing.  He is currently having a short-term rehabilitation at [facility] post hospital admission 03/04/2021 to 03/11/2021.  He has a PMH of chronic kidney disease is stage IIIb, insulin-dependent diabetes mellitus type 2, hypertension, aortic stenosis status post aortic valve replacement (05/2013), coronary artery disease status post CABG (2 vessel, 2014), gastroesophageal reflux disease, gout, macular degeneration and hyperlipidemia.  He went to [facility] ED on 03/04/21 for progressive weakness.  He developed an ulceration over the right great toe a month ago.  A week before hospitalization, he began to experience generalized weakness which resulted in the patient falling out of bed without loss of consciousness.  He continued to experience progressive worsening generalized weakness with associated poor appetite and worsening pain of the right foot with associated increasing purulence and foul smell of the foot. On 8/23, he had difficulty sitting up in his chair at  dinner and had to be fed by his wife.  He was then brought to the ED on 8/24.  In the ED, creatinine was elevated at 2.41, up from his baseline of approximately 1.8.  He was found to have clinical evidence of dehydration including dry mucous membranes.  Ulceration of the dorsal surface of the great toe of the right foot was x-rayed revealing soft tissue swelling and possible osteomyelitis of the interphalangeal joint he was administered doses of intravenous vancomycin and ceftriaxone for suspected osteomyelitis.  Podiatry was consulted and performed amputation of the right great toe on 8/27.  He was then transitioned to Keflex post op and to complete course on 9/4.  At [place], [place], he works with speech therapy with his swallowing issues he stated that he chews his food and has to sip water to swallow his food.  He is on a controlled carbohydrate diet.  He is bilaterally blind due to macular degeneration.  No noted tremors on both hands.  He stated that he sometimes had tremors. He continues to have PT and OT daily. SBPs ranging from 123-143, with outlier 165.  He takes Norvasc 10 mg daily, metoprolol tartrate 50 mg twice a day and hydralazine 100 mg 3 times a day for hypertension.  CBGs ranging from 100 cc to 246, with outlier 74 and 266.  He takes Lantus 35 units at bedtime and Humalog sliding scale before meals.   Past Medical History:  Diagnosis Date   Aortic stenosis    BPH (benign prostatic hypertrophy)    Branch retinal vein occlusion 08/01/2013   CAD (coronary artery disease)    CVA (cerebral infarction)    Diabetes  mellitus (HCC)    Diabetic retinopathy (HCC)    Gait disturbance    GERD (gastroesophageal reflux disease)    Gout    Heart murmur    HTN (hypertension)    Macular degeneration    Macular edema    Paresthesia    Pseudophakia of right eye    Ptosis    Past Surgical History:  Procedure Laterality Date   AMPUTATION Right 03/07/2021   Procedure: AMPUTATION GREAT TOE;   Surgeon: Felecia Shelling, DPM;  Location: [facility] OR;  Service: Podiatry;  Laterality: Right;   AORTIC VALVE REPLACEMENT  03/26/2013   CATARACT EXTRACTION W/ INTRAOCULAR LENS IMPLANT  11/01/2013   CATARACT EXTRACTION W/ INTRAOCULAR LENS IMPLANT Right 12/18/2009   COLONOSCOPY W/ POLYPECTOMY     CORONARY ARTERY BYPASS GRAFT  03/26/2013   EYE SURGERY  03/19/2016   Ocular tube shunt insertion   PARS PLANA VITRECTOMY  03/19/2016   PARS PLANA VITRECTOMY  11/08/2013   PARS PLANA VITRECTOMY  08/02/2012   PROSTATE SURGERY     TEE WITHOUT CARDIOVERSION N/A 05/02/2017   Procedure: TRANSESOPHAGEAL ECHOCARDIOGRAM (TEE);  Surgeon: Lars Masson, MD;  Location: [facility] ENDOSCOPY;  Service: Cardiovascular;  Laterality: N/A;    Allergies  Allergen Reactions   Gabapentin Other (See Comments)   Glyxambi [Empagliflozin-Linagliptin] Other (See Comments)    Lethargy, slurring speaking.    Heparin     Other reaction(s): Heparin-Induced Thrombocytopenia    Outpatient Encounter Medications as of 03/24/2021  Medication Sig   acetaminophen (TYLENOL) 325 MG tablet Take 2 tablets (650 mg total) by mouth every 6 (six) hours as needed for mild pain (or Fever >/= 101).   allopurinol (ZYLOPRIM) 100 MG tablet Take 200 mg by mouth at bedtime.    amLODipine (NORVASC) 10 MG tablet Take 1 tablet (10 mg total) by mouth daily.   aspirin 81 MG chewable tablet Chew 81 mg by mouth daily.   bisacodyl (DULCOLAX) 10 MG suppository Place 10 mg rectally as needed for moderate constipation.   docusate sodium (COLACE) 100 MG capsule Take 100 mg by mouth 2 (two) times daily.   furosemide (LASIX) 40 MG tablet Take 40 mg by mouth daily.    hydrALAZINE (APRESOLINE) 100 MG tablet Take 1 tablet (100 mg total) by mouth 3 (three) times daily.   insulin glargine (LANTUS) 100 UNIT/ML injection Inject 0.35 mLs (35 Units total) into the skin at bedtime. (Patient taking differently: Inject 24 Units into the skin at bedtime.)   insulin lispro  (HUMALOG) 100 UNIT/ML injection Inject 10-16 Units into the skin 3 (three) times daily before meals. Below 200 units = 10units 200-250=12 units 250-300=14units Over 300=16units   Magnesium Hydroxide (MILK OF MAGNESIA PO) Take 30 mLs by mouth as needed.   metoprolol (LOPRESSOR) 50 MG tablet Take 50 mg by mouth 2 (two) times daily.   Multiple Vitamin (MULTIVITAMIN WITH MINERALS) TABS tablet Take 1 tablet by mouth daily.   mupirocin ointment (BACTROBAN) 2 % Apply 1 application topically 2 (two) times daily.   Omeprazole-Sodium Bicarbonate (ZEGERID) 20-1100 MG CAPS capsule Take 1 capsule by mouth daily before breakfast.   rosuvastatin (CRESTOR) 10 MG tablet Take 10 mg by mouth daily.   senna (SENOKOT) 8.6 MG TABS tablet Take 1 tablet by mouth daily.   Sodium Phosphates (RA SALINE ENEMA RE) Place rectally.   zinc gluconate 50 MG tablet Take 50 mg by mouth daily.   No facility-administered encounter medications on file as of 03/24/2021.    Review of  Systems  GENERAL: No change in appetite, no fatigue, no weight changes, no fever, chills or weakness MOUTH and THROAT: Denies oral discomfort, gingival pain or bleeding RESPIRATORY: no cough, SOB, DOE, wheezing, hemoptysis CARDIAC: No chest pain or palpitations GI: No abdominal pain, diarrhea, constipation, heart burn, nausea or vomiting GU: Denies dysuria, frequency, hematuria, incontinence, or discharge NEUROLOGICAL: Denies dizziness, syncope, numbness, or headache PSYCHIATRIC: Denies feelings of depression or anxiety. No report of hallucinations, insomnia, paranoia, or agitation   Immunization History  Administered Date(s) Administered   Influenza, High Dose Seasonal PF 04/26/2017   Influenza,inj,quad, With Preservative 04/18/2013   Tdap 08/25/2017   Pertinent  Health Maintenance Due  Topic Date Due   FOOT EXAM  Never done   OPHTHALMOLOGY EXAM  Never done   PNA vac Low Risk Adult (1 of 2 - PCV13) Never done   INFLUENZA VACCINE   02/09/2021   HEMOGLOBIN A1C  09/05/2021   No flowsheet data found.   Vitals:   03/24/21 1348  BP: (!) 144/72  Pulse: 66  Resp: 18  Temp: (!) 97.5 F (36.4 C)  Height: 5\' 8"  (1.727 m)   Body mass index is 28.73 kg/m.  Physical Exam  GENERAL APPEARANCE: Well nourished. In no acute distress.  Obese  SKIN:  right foot wound with dressing and fracture shoe MOUTH and THROAT: Lips are without lesions. Oral mucosa is moist and without lesions.  RESPIRATORY: Breathing is even & unlabored, BS CTAB CARDIAC: RRR, no murmur,no extra heart sounds, left foot 1 +edema GI: Abdomen soft, normal BS, no masses, no tenderness NEUROLOGICAL: There is no tremor. Speech is clear. Alert and oriented X 3 PSYCHIATRIC: . Affect and behavior are appropriate  Labs reviewed: Recent Labs    03/05/21 0152 03/06/21 0335 03/07/21 0009 03/09/21 0332 03/11/21 0106  NA 138   < > 138 135 135  K 4.2   < > 3.9 4.5 4.2  CL 104   < > 104 101 103  CO2 24   < > 26 24 26   GLUCOSE 111*   < > 178* 241* 188*  BUN 22   < > 27* 22 34*  CREATININE 1.95*   < > 1.91* 1.75* 2.14*  CALCIUM 10.3   < > 10.0 10.7* 10.4*  MG 2.1  --   --   --  2.2  PHOS  --   --   --   --  3.5   < > = values in this interval not displayed.   Recent Labs    03/05/21 0152  AST 31  31  ALT 25  26  ALKPHOS 62  62  BILITOT 1.0  1.0  PROT 6.9  7.0  ALBUMIN 3.3*  3.4*   Recent Labs    03/05/21 0152 03/06/21 0555 03/07/21 0009 03/11/21 0106  WBC 11.1* 10.2 9.5 10.3  NEUTROABS 7.4 6.6  --   --   HGB 15.8 15.7 15.7 16.1  HCT 48.9 46.8 46.9 47.3  MCV 89.4 87.8 87.0 86.0  PLT 242 224 240 261   No results found for: TSH Lab Results  Component Value Date   HGBA1C 5.2 03/05/2021   Lab Results  Component Value Date   CHOL 177 04/29/2017   HDL 30 (L) 04/29/2017   LDLCALC 113 (H) 04/29/2017   TRIG 171 (H) 04/29/2017   CHOLHDL 5.9 04/29/2017    Significant Diagnostic Results in last 30 days:  CT HEAD WO CONTRAST  (05/01/2017)  Result Date: 03/11/2021 CLINICAL DATA:  Encephalopathy EXAM: CT HEAD WITHOUT CONTRAST TECHNIQUE: Contiguous axial images were obtained from the base of the skull through the vertex without intravenous contrast. COMPARISON:  03/04/2021 FINDINGS: Brain: There is no mass, hemorrhage or extra-axial collection. There is generalized atrophy without lobar predilection. Hypodensity of the white matter is most commonly associated with chronic microvascular disease. Vascular: Atherosclerotic calcification of the vertebral and internal carotid arteries at the skull base. No abnormal hyperdensity of the major intracranial arteries or dural venous sinuses. Skull: The visualized skull base, calvarium and extracranial soft tissues are normal. Sinuses/Orbits: No fluid levels or advanced mucosal thickening of the visualized paranasal sinuses. No mastoid or middle ear effusion. The orbits are normal. IMPRESSION: Generalized atrophy and chronic microvascular ischemia without acute intracranial abnormality. Electronically Signed   By: Deatra Robinson M.D.   On: 03/11/2021 00:40   CT Head Wo Contrast  Result Date: 03/04/2021 CLINICAL DATA:  Increasing weakness EXAM: CT HEAD WITHOUT CONTRAST TECHNIQUE: Contiguous axial images were obtained from the base of the skull through the vertex without intravenous contrast. COMPARISON:  04/25/2017 FINDINGS: Brain: No evidence of acute infarction, hemorrhage, hydrocephalus, extra-axial collection or mass lesion/mass effect. Chronic atrophic and ischemic changes are noted similar to that seen on the prior exam. Vascular: No hyperdense vessel or unexpected calcification. Skull: Normal. Negative for fracture or focal lesion. Sinuses/Orbits: Postsurgical changes in the left orbit are noted stable from the prior exam. Other: None. IMPRESSION: Chronic atrophic and ischemic changes without acute abnormality. Electronically Signed   By: Alcide Clever M.D.   On: 03/04/2021 20:25   MR TOES RIGHT  WO CONTRAST  Result Date: 03/05/2021 CLINICAL DATA:  Foot wound with evidence of bony destruction in the distal aspect of the first proximal phalanx. EXAM: MRI OF THE RIGHT TOES WITHOUT CONTRAST TECHNIQUE: Multiplanar, multisequence MR imaging of the right forefoot was performed. No intravenous contrast was administered. COMPARISON:  Plain film from the previous day. FINDINGS: Bones/Joint/Cartilage In the first proximal and distal phalanges, there are changes of bony edema as well as cortical breakdown in the distal aspect of the first proximal phalanx similar to that seen on prior plain film examination. Given the overlying soft tissue wound and destructive changes seen on plain film these findings are most consistent with osteomyelitis. No other area of bony edema is identified. Ligaments Within normal limits as visualized. Muscles and Tendons Visualized musculature appears within normal limits. Soft tissues Soft tissue swelling is noted as well as focal soft tissue wound near the first interphalangeal joint. No focal abscess is seen. Considerable dorsal soft tissue swelling is noted. IMPRESSION: Bony edematous changes within the first proximal and distal phalanges with adjacent soft tissue ulcer most consistent with osteomyelitis. Soft tissue swelling is noted in the dorsal aspect of the forefoot without discrete abscess. Electronically Signed   By: Alcide Clever M.D.   On: 03/05/2021 01:49   DG Foot Complete Right  Result Date: 03/07/2021 CLINICAL DATA:  Post amputation EXAM: RIGHT FOOT COMPLETE - 3+ VIEW COMPARISON:  03/04/2021 FINDINGS: Interval amputation across the first MTP joint. Distal skin staples. Small amount of subcutaneous gas at the surgical site. No radiodense foreign body. Diffuse osteopenia. No fracture or other acute findings. IMPRESSION: Postop changes of first MTP amputation without apparent complication. Electronically Signed   By: Corlis Leak M.D.   On: 03/07/2021 10:28   DG Toe Great  Right  Result Date: 03/04/2021 CLINICAL DATA:  Wound to the right first toe.  Looks infected. EXAM: RIGHT GREAT TOE  COMPARISON:  None. FINDINGS: Diffuse bone demineralization. Degenerative changes in the interphalangeal joint, first metatarsal phalangeal joint, and first tarsometatarsal joint. Diffuse soft tissue swelling of the right first toe with evidence of focal skin ulceration along the anteromedial aspect. Suggestion of focal periarticular cortical erosion along the lateral base of the distal phalanx. This could indicate osteomyelitis or arthritis. No acute fracture or dislocation. IMPRESSION: Soft tissue swelling and soft tissue defect consistent with ulceration. Periarticular cortical erosion at the interphalangeal joint may represent osteomyelitis or arthritis. Degenerative changes and diffuse demineralization. Electronically Signed   By: Burman Nieves M.D.   On: 03/04/2021 17:46    Assessment/Plan  1. Generalized weakness -   continue PT and OT, for therapeutic strengthening exercises  2. Essential hypertension -  BPs stable, continue hydralazine, Norvasc and metoprolol tartrate  3. Type 2 diabetes mellitus with stage 3b chronic kidney disease, with long-term current use of insulin (HCC) Lab Results  Component Value Date   HGBA1C 5.2 03/05/2021   -  CBGs stable, Lantus and Humalog sliding scale -   Monitor CBGs  4.  Osteomyelitis of right great toe right foot -   S/P amputation of right great toe on 8/27 -   Completed antibiotics -    Continue wound treatment  5.  Dysphagia -  continue ST treatments -  aspiration precautions    Family/ staff Communication:   Discussed plan of care with resident and charge nurse.  Labs/tests ordered: None  Goals of care:   Short-term care   Kenard Gower, DNP, MSN, FNP-BC [facility] [phone] (Monday-Friday 8:00 a.m. - 5:00 p.m.) [phone] (after hours)

## 2021-03-26 ENCOUNTER — Encounter: Payer: Self-pay | Admitting: Internal Medicine

## 2021-03-26 ENCOUNTER — Non-Acute Institutional Stay (SKILLED_NURSING_FACILITY): Payer: Medicare Other | Admitting: Internal Medicine

## 2021-03-26 DIAGNOSIS — Z8669 Personal history of other diseases of the nervous system and sense organs: Secondary | ICD-10-CM | POA: Diagnosis not present

## 2021-03-26 DIAGNOSIS — N1832 Chronic kidney disease, stage 3b: Secondary | ICD-10-CM

## 2021-03-26 DIAGNOSIS — Z8673 Personal history of transient ischemic attack (TIA), and cerebral infarction without residual deficits: Secondary | ICD-10-CM

## 2021-03-26 DIAGNOSIS — Z794 Long term (current) use of insulin: Secondary | ICD-10-CM | POA: Diagnosis not present

## 2021-03-26 DIAGNOSIS — M869 Osteomyelitis, unspecified: Secondary | ICD-10-CM | POA: Diagnosis not present

## 2021-03-26 DIAGNOSIS — E1122 Type 2 diabetes mellitus with diabetic chronic kidney disease: Secondary | ICD-10-CM | POA: Diagnosis not present

## 2021-03-26 NOTE — Patient Instructions (Addendum)
See assessment and plan under each diagnosis in the problem list and acutely for this visit Total time  43 minutes; greater than 50% of the visit spent counseling patient and coordinating care for problems addressed at this encounter. I attempted to call Dr Merri Brunette ,his PCP 3 times unsuccessfully to discuss his wife's concerns [phone]). I shall send copy of assessment to him & Dr Marvel Plan, Neurology.

## 2021-03-26 NOTE — Assessment & Plan Note (Addendum)
03/26/2021 : Responses are slow but he was oriented to location and date.  Formal MMSE can be performed; but the patient appears to be functioning at extremely high level despite history of recurrent strokes with imaging documentation of significant chronic ischemic changes with atrophy.

## 2021-03-26 NOTE — Progress Notes (Signed)
NURSING HOME LOCATION:  [facility] ROOM NUMBER: 108  CODE STATUS:  Full Code   PCP:  Merri Brunette MD  This is a nursing facility follow up visit for specific acute issue of wife's concerns about incomplete inpatient neurologic evaluation and possibility of missed recurrent stroke.  Interim medical record and care since last SNF visit was updated with review of diagnostic studies and change in clinical status since last visit were documented.  HPI: His wife stopped me in the hall 9/13  & inquired as to whether I was the facility physician as I had the stethoscope around my neck.  I identified myself and she expressed concerns that a possible neurologic event had been missed during her husband's hospitalization due to "incomplete work-up".  I asked her who his PCP was.  I reassured her that Dr. Renne Crigler was extremely competent clinician and would pursue appropriate evaluations.  She stated that she had already called his office; and they had declined to make a neurology referral. Hospital records were reviewed.  He had CT scan of the head on 8/24 as well as 8/30 because of non focal /generalized weakness  These revealed generalized atrophy and chronic microvascular ischemia without acute change. I also reviewed remote Neurology consult notes.  Dr. Marvel Plan, Neurologist saw the patient 05/02/2017.  At that time MRI/MRA of the brain revealed scattered punctate foci of acute ischemia within both cerebral hemispheres and the right cerebellum.  The largest focus was in the medial right cerebellum measuring 10 mm.  The distribution was most consistent with a central cardiac or aortic embolic source.  Old right cerebellar infarct and findings of chronic microvascular ischemia were noted.  CT of the head 04/25/2017 had revealed no acute intracranial abnormalities.  There was stable moderate brain parenchymal volume loss and mild chronic microvascular ischemic changes of the brain.   Also interval small chronic infarction within the right superior cerebellar hemisphere was documented.  Full dose aspirin prophylaxis was recommended. He had been admitted 8/24-8/31 for osteomyelitis of the right great toe with associated generalized weakness present over approximately a month PTA.  Apparently in late July he had developed an ulcer over the great toe associated with odiferous discharge.  Apparently he was so weak that he had difficulty sitting up and had to be fed by his wife.  He also fell out of the bed which today he tells me was related to his visual deficiencies. Hospital course was complicated by AKI.  The great toe was amputated 8/27. Here at the SNF PT/OT reports that he has had significant improvement in reference to weakness.  Speech therapy states that the dysphagia has resolved and his only difficulty is locating his food on his plate because of his legal blindness.  He has been provided a "divided plate" which has helped with this.  He tells me he is able to sit up and eat without complication. Diabetes control has been good with ranges from 94-227, the latter was only value > 200.  Review of systems: His responses are slow but he is oriented.  He stated that it was Thursday, September 2022 and that on Monday the 19th Zelphia [place] was to be buried.  He gave me his room number.  He knew he was in a facility "next-door to [facility]" but did not know the name.  He did states that sometimes he feels "goofy" and has difficulty with locations.  This is in the context of bilateral blindness related to  macular degeneration. He did know that his "big toe was cut off because of poison".  He stated that he had rolled out of bed because he could not see and at that point felt the bleeding from the toe. At this time he denies significant neurologic symptoms such as headache or significant numbness and tingling.  He also denies any significant weakness at this time.  Physical exam:   Pertinent or positive findings: He was sitting in the wheelchair.  He stares blankly.  He is blind bilaterally, unable to even see light.  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  Pattern alopecia is present especially over the crown.  The lower anterior mandibular teeth are stained.  He has an upper plate and lower partial.  There is a grade 1 rumbling murmur at the right base with an occasional premature.  He has bilateral carotid rumbling sounds which may be radiation of the murmur or carotid stenosis.  Breath sounds are decreased.  He has 1/2+ edema at the sock line.  The pedal pulses are decreased.  The right foot is dressed surgically.  The calves are atrophic.  There is interosseous wasting mainly between the thumb and the index fingers,especially on the left.  He exhibits excellent strength to opposition in all extremities without clinical asymmetry.  Reflexes in the upper extremities are 1+ and 0-1/2+ in the lower extremities.  Sensation to light touch is intact bilaterally in the upper and lower extremities.  He has stasis hyperpigmentation at the right ankle above the dressing.  General appearance: Adequately nourished; no acute distress, increased work of breathing is present.   Lymphatic: No lymphadenopathy about the head, neck, axilla. Ears:  External ear exam shows no significant lesions or deformities.   Nose:  External nasal examination shows no deformity or inflammation. Nasal mucosa are pink and moist without lesions, exudates Neck:  No thyromegaly, masses, tenderness noted.    Heart:  No gallop, click, rub .  Lungs:  without wheezes, rhonchi, rales, rubs. Abdomen: Bowel sounds are normal. Abdomen is soft and nontender with no organomegaly, hernias, masses. GU: Deferred  Extremities:  No cyanosis, clubbing  Neurologic exam :Balance, Rhomberg, finger to nose testing could not be completed due to clinical state Skin: Warm & dry w/o tenting. No significant  rash.  See summary under each  active problem in the Problem List with associated updated therapeutic plan

## 2021-03-26 NOTE — Assessment & Plan Note (Addendum)
03/24/2021 wife expressed concerns recurrent CVA had not been completely ruled out during his 8/24-8/31/22 hospitalization for osteomyelitis of the right great toe associated with profound debilitation and AKI. Apparently toe ulcer had ben present since late July.He went to hospital only because of bleeding from toe when he fell out of bed "because couldn't see" (Macular Degeneration complicated by blindness) 8/24 and 03/10/2021 CT of the head performed because of persistent weakness revealed generalized atrophy and chronic microvascular ischemia without acute change. [facility] PT/OT states that patient has made significant progress in reference to his generalized weakness & needs only directions when ambulating due to blindness. They questioned whether he may actually function better at home because of his familiarity with his environment in the context of his blindness. Speech Therapy states that he is able to sit up and eat; employment of the divided plate has been of benefit in the context of the blindness. 03/26/2021 exam did not suggest any acute or progressive neurologic deficits.  He was incredibly strong to opposition in all extremities. Further neuro evaluation is deferred to his PCP.

## 2021-03-26 NOTE — Assessment & Plan Note (Addendum)
03/17/2021 Podiatry follow-up documented healing w/o complication.  Next office visit to be 9/20 with plans to remove the sutures.

## 2021-03-26 NOTE — Assessment & Plan Note (Signed)
DM with neuro ,  renal & vascular complications Glucose range  @ SNF: 94-227 (latter only value > 200) No hypoglycemia No change indicated

## 2021-03-31 ENCOUNTER — Non-Acute Institutional Stay (SKILLED_NURSING_FACILITY): Payer: Medicare Other | Admitting: Adult Health

## 2021-03-31 ENCOUNTER — Encounter: Payer: Self-pay | Admitting: Adult Health

## 2021-03-31 DIAGNOSIS — Z8673 Personal history of transient ischemic attack (TIA), and cerebral infarction without residual deficits: Secondary | ICD-10-CM

## 2021-03-31 DIAGNOSIS — R531 Weakness: Secondary | ICD-10-CM | POA: Diagnosis not present

## 2021-03-31 NOTE — Progress Notes (Signed)
Location:  [facility] Room Number: 108-A Place of Service:  SNF (31) Provider:  Kenard Gower, DNP, FNP-BC  Patient Care Team: Merri Brunette, MD as PCP - General (Internal Medicine)  Extended Emergency Contact Information Primary Emergency Contact: Kertesz,Jean Address: [address]          [place], [place] [address] [place] Home Phone: [phone] Mobile Phone: [phone] Relation: Spouse  Code Status:  FULL CODE  Goals of care: Advanced Directive information Advanced Directives 04/03/2021  Does Patient Have a Medical Advance Directive? No  Would patient like information on creating a medical advance directive? No - Patient declined     Chief Complaint  Patient presents with   Acute Visit    Requesting for neurology consult    HPI:  Pt is a 87 y.o. male seen today for wife requesting a referral to a neurologist. He has a history of CVA with no focal deficits. He currently takes ASA 81 mg daily and Crestor 10 mg daily. Bilateral grips are equally strong. He is bilaterally blind due to macular degeneration. CT scan of the head on 8/24, as well as 8/30, was done due to generalized weakness.  This revealed generalized atrophy and chronic microvascular ischemia without acute change.  He was seen by the neurologist, Dr. Marvel Plan on 05/02/2017.  At that time, MRI/MRA of the brain revealed scattered punctate foci of acute ischemia within both cerebral hemispheres and the right cerebellum.  The largest focus was in the medial right cerebellum measuring 10 mm.  The distribution was most consistent with a central cardiac or aortic embolic source.  Old right cerebellar infarct and findings of chronic microvascular ischemia were noted.  CT of the head 04/25/2017 had revealed no acute intracranial abnormalities.  There was stable moderate brain parenchymal volume loss and mild chronic microvascular ischemic changes of the brain.  Interval small,  infarction within the right superior cerebellar hemisphere was documented.  A full dose of aspirin prophylaxis was recommended.  He has BIMS score 15/15. He is currently having PT, OT and ST.  Past Medical History:  Diagnosis Date   Aortic stenosis    BPH (benign prostatic hypertrophy)    Branch retinal vein occlusion 08/01/2013   CAD (coronary artery disease)    CVA (cerebral infarction)    Diabetes mellitus (HCC)    Diabetic retinopathy (HCC)    Gait disturbance    GERD (gastroesophageal reflux disease)    Gout    Heart murmur    HTN (hypertension)    Macular degeneration    Macular edema    Paresthesia    Pseudophakia of right eye    Ptosis    Past Surgical History:  Procedure Laterality Date   AMPUTATION Right 03/07/2021   Procedure: AMPUTATION GREAT TOE;  Surgeon: Felecia Shelling, DPM;  Location: [facility] OR;  Service: Podiatry;  Laterality: Right;   AORTIC VALVE REPLACEMENT  03/26/2013   CATARACT EXTRACTION W/ INTRAOCULAR LENS IMPLANT  11/01/2013   CATARACT EXTRACTION W/ INTRAOCULAR LENS IMPLANT Right 12/18/2009   COLONOSCOPY W/ POLYPECTOMY     CORONARY ARTERY BYPASS GRAFT  03/26/2013   EYE SURGERY  03/19/2016   Ocular tube shunt insertion   PARS PLANA VITRECTOMY  03/19/2016   PARS PLANA VITRECTOMY  11/08/2013   PARS PLANA VITRECTOMY  08/02/2012   PROSTATE SURGERY     TEE WITHOUT CARDIOVERSION N/A 05/02/2017   Procedure: TRANSESOPHAGEAL ECHOCARDIOGRAM (TEE);  Surgeon: Lars Masson, MD;  Location: [facility]  ENDOSCOPY;  Service: Cardiovascular;  Laterality: N/A;    Allergies  Allergen Reactions   Gabapentin Other (See Comments)   Glyxambi [Empagliflozin-Linagliptin] Other (See Comments)    Lethargy, slurring speaking.    Heparin     Other reaction(s): Heparin-Induced Thrombocytopenia    Outpatient Encounter Medications as of 03/31/2021  Medication Sig   acetaminophen (TYLENOL) 325 MG tablet Take 2 tablets (650 mg total) by mouth every 6 (six) hours as needed for mild  pain (or Fever >/= 101).   allopurinol (ZYLOPRIM) 100 MG tablet Take 200 mg by mouth at bedtime.    amLODipine (NORVASC) 10 MG tablet Take 1 tablet (10 mg total) by mouth daily.   aspirin 81 MG chewable tablet Chew 81 mg by mouth daily.   bisacodyl (DULCOLAX) 10 MG suppository Place 10 mg rectally as needed for moderate constipation.   docusate sodium (COLACE) 100 MG capsule Take 100 mg by mouth daily.   furosemide (LASIX) 40 MG tablet Take 40 mg by mouth daily.    hydrALAZINE (APRESOLINE) 100 MG tablet Take 1 tablet (100 mg total) by mouth 3 (three) times daily.   insulin glargine (LANTUS) 100 UNIT/ML injection Inject 0.35 mLs (35 Units total) into the skin at bedtime.   insulin lispro (HUMALOG) 100 UNIT/ML injection Inject 10-16 Units into the skin 3 (three) times daily before meals. Below 200 units = 10units 200-250=12 units 250-300=14units Over 300=16units   Magnesium Hydroxide (MILK OF MAGNESIA PO) Take 30 mLs by mouth as needed.   metoprolol (LOPRESSOR) 50 MG tablet Take 50 mg by mouth 2 (two) times daily.   Multiple Vitamin (MULTIVITAMIN WITH MINERALS) TABS tablet Take 1 tablet by mouth daily.   Omeprazole-Sodium Bicarbonate (ZEGERID) 20-1100 MG CAPS capsule Take 1 capsule by mouth daily before breakfast.   rosuvastatin (CRESTOR) 10 MG tablet Take 10 mg by mouth daily.   senna (SENOKOT) 8.6 MG TABS tablet Take 1 tablet by mouth daily.   Sodium Phosphates (RA SALINE ENEMA RE) Place rectally.   zinc gluconate 50 MG tablet Take 50 mg by mouth daily.   [DISCONTINUED] mupirocin ointment (BACTROBAN) 2 % Apply 1 application topically 2 (two) times daily.   No facility-administered encounter medications on file as of 03/31/2021.    Review of Systems  GENERAL: No change in appetite, no fatigue, no weight changes, no fever and chills  MOUTH and THROAT: Denies oral discomfort, gingival pain or bleeding RESPIRATORY: no cough, SOB, DOE, wheezing, hemoptysis CARDIAC: No chest pain, edema or  palpitations GI: No abdominal pain, diarrhea, constipation, heart burn, nausea or vomiting GU: Denies dysuria, frequency, hematuria, incontinence, or discharge NEUROLOGICAL: Denies dizziness, syncope, numbness, or headache PSYCHIATRIC: Denies feelings of depression or anxiety. No report of hallucinations, insomnia, paranoia, or agitation   Immunization History  Administered Date(s) Administered   Influenza, High Dose Seasonal PF 05/08/2014, 05/20/2015, 04/26/2017   Influenza,inj,quad, With Preservative 04/18/2013   Pneumococcal Conjugate-13 08/25/2017   Pneumococcal Polysaccharide-23 07/18/2004   Tdap 08/25/2017   Pertinent  Health Maintenance Due  Topic Date Due   FOOT EXAM  Never done   OPHTHALMOLOGY EXAM  Never done   INFLUENZA VACCINE  02/09/2021   HEMOGLOBIN A1C  09/05/2021   No flowsheet data found.   Vitals:   03/31/21 1317  BP: 134/65  Pulse: 72  Resp: 20  Temp: 97.8 F (36.6 C)  Height: 5\' 8"  (1.727 m)   Body mass index is 27.79 kg/m.  Physical Exam  GENERAL APPEARANCE: Well nourished. In no acute distress.  SKIN:  Right foot wound with dressing HEAD: Normal in size and contour. No evidence of trauma MOUTH and THROAT: Lips are without lesions. Oral mucosa is moist and without lesions.  RESPIRATORY: Breathing is even & unlabored, BS CTAB CARDIAC: RRR, no murmur,no extra heart sounds GI: Abdomen soft, normal BS, no masses, no tenderness EXTREMITIES: Able to move x4 extremities NEUROLOGICAL: There is no tremor. Speech is clear. Alert and oriented X 3. PSYCHIATRIC:  Affect and behavior are appropriate  Labs reviewed: Recent Labs    03/05/21 0152 03/06/21 0335 03/07/21 0009 03/09/21 0332 03/11/21 0106 03/13/21 0000  NA 138   < > 138 135 135 142  K 4.2   < > 3.9 4.5 4.2 4.4  CL 104   < > 104 101 103 105  CO2 24   < > 26 24 26  24*  GLUCOSE 111*   < > 178* 241* 188*  --   BUN 22   < > 27* 22 34* 53*  CREATININE 1.95*   < > 1.91* 1.75* 2.14* 2.6*   CALCIUM 10.3   < > 10.0 10.7* 10.4* 10.2  MG 2.1  --   --   --  2.2  --   PHOS  --   --   --   --  3.5  --    < > = values in this interval not displayed.   Recent Labs    03/05/21 0152 03/13/21 0000  AST 31  31 31   ALT 25  26 24   ALKPHOS 62  62 73  BILITOT 1.0  1.0  --   PROT 6.9  7.0  --   ALBUMIN 3.3*  3.4*  --    Recent Labs    03/05/21 0152 03/06/21 0555 03/07/21 0009 03/11/21 0106 03/13/21 0000  WBC 11.1* 10.2 9.5 10.3 10.0  NEUTROABS 7.4 6.6  --   --   --   HGB 15.8 15.7 15.7 16.1 15.1  HCT 48.9 46.8 46.9 47.3 44  MCV 89.4 87.8 87.0 86.0  --   PLT 242 224 240 261 258   No results found for: TSH Lab Results  Component Value Date   HGBA1C 5.2 03/05/2021   Lab Results  Component Value Date   CHOL 177 04/29/2017   HDL 30 (L) 04/29/2017   LDLCALC 113 (H) 04/29/2017   TRIG 171 (H) 04/29/2017   CHOLHDL 5.9 04/29/2017    Significant Diagnostic Results in last 30 days:  CT HEAD WO CONTRAST (05/01/2017)  Result Date: 03/11/2021 CLINICAL DATA:  Encephalopathy EXAM: CT HEAD WITHOUT CONTRAST TECHNIQUE: Contiguous axial images were obtained from the base of the skull through the vertex without intravenous contrast. COMPARISON:  03/04/2021 FINDINGS: Brain: There is no mass, hemorrhage or extra-axial collection. There is generalized atrophy without lobar predilection. Hypodensity of the white matter is most commonly associated with chronic microvascular disease. Vascular: Atherosclerotic calcification of the vertebral and internal carotid arteries at the skull base. No abnormal hyperdensity of the major intracranial arteries or dural venous sinuses. Skull: The visualized skull base, calvarium and extracranial soft tissues are normal. Sinuses/Orbits: No fluid levels or advanced mucosal thickening of the visualized paranasal sinuses. No mastoid or middle ear effusion. The orbits are normal. IMPRESSION: Generalized atrophy and chronic microvascular ischemia without acute  intracranial abnormality. Electronically Signed   By: M.D.   On: 03/11/2021 00:40   CT Head Wo Contrast  Result Date: 03/04/2021 CLINICAL DATA:  Increasing weakness EXAM: CT HEAD WITHOUT CONTRAST TECHNIQUE:  Contiguous axial images were obtained from the base of the skull through the vertex without intravenous contrast. COMPARISON:  04/25/2017 FINDINGS: Brain: No evidence of acute infarction, hemorrhage, hydrocephalus, extra-axial collection or mass lesion/mass effect. Chronic atrophic and ischemic changes are noted similar to that seen on the prior exam. Vascular: No hyperdense vessel or unexpected calcification. Skull: Normal. Negative for fracture or focal lesion. Sinuses/Orbits: Postsurgical changes in the left orbit are noted stable from the prior exam. Other: None. IMPRESSION: Chronic atrophic and ischemic changes without acute abnormality. Electronically Signed   By: Alcide Clever M.D.   On: 03/04/2021 20:25   MR TOES RIGHT WO CONTRAST  Result Date: 03/05/2021 CLINICAL DATA:  Foot wound with evidence of bony destruction in the distal aspect of the first proximal phalanx. EXAM: MRI OF THE RIGHT TOES WITHOUT CONTRAST TECHNIQUE: Multiplanar, multisequence MR imaging of the right forefoot was performed. No intravenous contrast was administered. COMPARISON:  Plain film from the previous day. FINDINGS: Bones/Joint/Cartilage In the first proximal and distal phalanges, there are changes of bony edema as well as cortical breakdown in the distal aspect of the first proximal phalanx similar to that seen on prior plain film examination. Given the overlying soft tissue wound and destructive changes seen on plain film these findings are most consistent with osteomyelitis. No other area of bony edema is identified. Ligaments Within normal limits as visualized. Muscles and Tendons Visualized musculature appears within normal limits. Soft tissues Soft tissue swelling is noted as well as focal soft tissue  wound near the first interphalangeal joint. No focal abscess is seen. Considerable dorsal soft tissue swelling is noted. IMPRESSION: Bony edematous changes within the first proximal and distal phalanges with adjacent soft tissue ulcer most consistent with osteomyelitis. Soft tissue swelling is noted in the dorsal aspect of the forefoot without discrete abscess. Electronically Signed   By: Alcide Clever M.D.   On: 03/05/2021 01:49   DG Foot Complete Right  Result Date: 03/07/2021 CLINICAL DATA:  Post amputation EXAM: RIGHT FOOT COMPLETE - 3+ VIEW COMPARISON:  03/04/2021 FINDINGS: Interval amputation across the first MTP joint. Distal skin staples. Small amount of subcutaneous gas at the surgical site. No radiodense foreign body. Diffuse osteopenia. No fracture or other acute findings. IMPRESSION: Postop changes of first MTP amputation without apparent complication. Electronically Signed   By: Corlis Leak M.D.   On: 03/07/2021 10:28   DG Toe Great Right  Result Date: 03/04/2021 CLINICAL DATA:  Wound to the right first toe.  Looks infected. EXAM: RIGHT GREAT TOE COMPARISON:  None. FINDINGS: Diffuse bone demineralization. Degenerative changes in the interphalangeal joint, first metatarsal phalangeal joint, and first tarsometatarsal joint. Diffuse soft tissue swelling of the right first toe with evidence of focal skin ulceration along the anteromedial aspect. Suggestion of focal periarticular cortical erosion along the lateral base of the distal phalanx. This could indicate osteomyelitis or arthritis. No acute fracture or dislocation. IMPRESSION: Soft tissue swelling and soft tissue defect consistent with ulceration. Periarticular cortical erosion at the interphalangeal joint may represent osteomyelitis or arthritis. Degenerative changes and diffuse demineralization. Electronically Signed   By: Burman Nieves M.D.   On: 03/04/2021 17:46    Assessment/Plan  1. History of CVA (cerebrovascular accident) -    Continue aspirin 81 mg daily and Crestor 10 mg 1 tab daily -  will refer for neurology consult   2. Generalized weakness -  continue PT and OT, for therapeutic strengthening exercises -   Fall precautions  Family/ staff Communication:   Discussed plan of care with resident and charge nurse.  Labs/tests ordered: None  Goals of care:   Short-term care   Kenard Gower, DNP, MSN, FNP-BC [facility] [phone] (Monday-Friday 8:00 a.m. - 5:00 p.m.) [phone] (after hours)

## 2021-04-02 ENCOUNTER — Other Ambulatory Visit: Payer: Self-pay

## 2021-04-02 ENCOUNTER — Ambulatory Visit (INDEPENDENT_AMBULATORY_CARE_PROVIDER_SITE_OTHER): Payer: Medicare Other | Admitting: Podiatry

## 2021-04-02 DIAGNOSIS — N1832 Chronic kidney disease, stage 3b: Secondary | ICD-10-CM | POA: Diagnosis not present

## 2021-04-02 DIAGNOSIS — E1122 Type 2 diabetes mellitus with diabetic chronic kidney disease: Secondary | ICD-10-CM

## 2021-04-02 DIAGNOSIS — Z794 Long term (current) use of insulin: Secondary | ICD-10-CM

## 2021-04-02 DIAGNOSIS — Z89411 Acquired absence of right great toe: Secondary | ICD-10-CM | POA: Diagnosis not present

## 2021-04-02 NOTE — Progress Notes (Signed)
  Subjective:  Patient ID: Johnny [name], male    DOB: [birth year],  MRN: [id number]  Chief Complaint  Patient presents with   Routine Post Op     pov #2 amputation of right great toe 03/07/2021;for suture removal      87 y.o. male returns for post-op check.  Doing well he is here with his wife today  Review of Systems: Negative except as noted in the HPI. Denies N/V/F/Ch.   Objective:  There were no vitals filed for this visit. There is no height or weight on file to calculate BMI. Constitutional Well developed. Well nourished.  Vascular Foot warm and well perfused. Capillary refill normal to all digits.   Neurologic Normal speech. Oriented to person, place, and time. Epicritic sensation to light touch grossly reduced bilaterally.  Dermatologic Skin healing well without signs of infection. Skin edges well coapted without signs of infection.  Orthopedic: Tenderness to palpation noted about the surgical site.   Assessment:   1. History of amputation of right great toe (HCC)   2. Type 2 diabetes mellitus with stage 3b chronic kidney disease, with long-term current use of insulin (HCC)     Plan:  Patient was evaluated and treated and all questions answered.  S/p foot surgery right -Doing well sutures and staples removed he will be fitted for a diabetic shoe and amputation filler insert.  They will follow-up with Dr. Logan Bores in 1 month for final postop visit  Return in about 1 month (around 05/02/2021) for post op (no x-rays).

## 2021-04-03 ENCOUNTER — Non-Acute Institutional Stay (SKILLED_NURSING_FACILITY): Payer: Medicare Other | Admitting: Adult Health

## 2021-04-03 ENCOUNTER — Encounter: Payer: Self-pay | Admitting: Adult Health

## 2021-04-03 DIAGNOSIS — R531 Weakness: Secondary | ICD-10-CM

## 2021-04-03 DIAGNOSIS — E1122 Type 2 diabetes mellitus with diabetic chronic kidney disease: Secondary | ICD-10-CM | POA: Diagnosis not present

## 2021-04-03 DIAGNOSIS — I10 Essential (primary) hypertension: Secondary | ICD-10-CM | POA: Diagnosis not present

## 2021-04-03 DIAGNOSIS — N1832 Chronic kidney disease, stage 3b: Secondary | ICD-10-CM | POA: Diagnosis not present

## 2021-04-03 DIAGNOSIS — Z794 Long term (current) use of insulin: Secondary | ICD-10-CM

## 2021-04-03 DIAGNOSIS — K5901 Slow transit constipation: Secondary | ICD-10-CM | POA: Diagnosis not present

## 2021-04-03 DIAGNOSIS — Z8739 Personal history of other diseases of the musculoskeletal system and connective tissue: Secondary | ICD-10-CM | POA: Diagnosis not present

## 2021-04-03 DIAGNOSIS — K219 Gastro-esophageal reflux disease without esophagitis: Secondary | ICD-10-CM

## 2021-04-03 DIAGNOSIS — N179 Acute kidney failure, unspecified: Secondary | ICD-10-CM

## 2021-04-03 DIAGNOSIS — Z8673 Personal history of transient ischemic attack (TIA), and cerebral infarction without residual deficits: Secondary | ICD-10-CM

## 2021-04-03 DIAGNOSIS — M869 Osteomyelitis, unspecified: Secondary | ICD-10-CM | POA: Diagnosis not present

## 2021-04-03 DIAGNOSIS — R600 Localized edema: Secondary | ICD-10-CM

## 2021-04-03 MED ORDER — INSULIN LISPRO 100 UNIT/ML ~~LOC~~ SOLN: 10.0000 [IU] | Freq: Three times a day (TID) | SUBCUTANEOUS | 1 refills | Status: AC

## 2021-04-03 MED ORDER — INSULIN GLARGINE 100 UNIT/ML ~~LOC~~ SOLN: 35.0000 [IU] | Freq: Every day | SUBCUTANEOUS | 0 refills | Status: DC

## 2021-04-03 MED ORDER — ZINC GLUCONATE 50 MG PO TABS: 50.0000 mg | ORAL_TABLET | Freq: Every day | ORAL | 0 refills | Status: AC

## 2021-04-03 MED ORDER — DOCUSATE SODIUM 100 MG PO CAPS: 100.0000 mg | ORAL_CAPSULE | Freq: Every day | ORAL | 0 refills | Status: DC

## 2021-04-03 MED ORDER — HYDRALAZINE HCL 100 MG PO TABS: 100.0000 mg | ORAL_TABLET | Freq: Three times a day (TID) | ORAL | 0 refills | Status: AC

## 2021-04-03 MED ORDER — ASPIRIN 81 MG PO CHEW: 81.0000 mg | CHEWABLE_TABLET | Freq: Every day | ORAL | 0 refills | Status: AC

## 2021-04-03 MED ORDER — ROSUVASTATIN CALCIUM 10 MG PO TABS: 10.0000 mg | ORAL_TABLET | Freq: Every day | ORAL | 0 refills | Status: AC

## 2021-04-03 MED ORDER — FUROSEMIDE 40 MG PO TABS: 40.0000 mg | ORAL_TABLET | Freq: Every day | ORAL | 0 refills | Status: DC

## 2021-04-03 MED ORDER — OMEPRAZOLE-SODIUM BICARBONATE 20-1100 MG PO CAPS: 1.0000 | ORAL_CAPSULE | Freq: Every day | ORAL | 0 refills | Status: DC

## 2021-04-03 MED ORDER — ALLOPURINOL 100 MG PO TABS: 200.0000 mg | ORAL_TABLET | Freq: Every day | ORAL | 0 refills | Status: AC

## 2021-04-03 MED ORDER — METOPROLOL TARTRATE 50 MG PO TABS: 50.0000 mg | ORAL_TABLET | Freq: Two times a day (BID) | ORAL | 0 refills | Status: AC

## 2021-04-03 MED ORDER — SENNA 8.6 MG PO TABS: 1.0000 | ORAL_TABLET | Freq: Every day | ORAL | 0 refills | Status: AC

## 2021-04-03 MED ORDER — AMLODIPINE BESYLATE 10 MG PO TABS: 10.0000 mg | ORAL_TABLET | Freq: Every day | ORAL | 0 refills | Status: AC

## 2021-04-03 NOTE — Progress Notes (Addendum)
Location:  [facility] Room Number: 108-A Place of Service:  SNF (31) Provider:  Kenard Gower, DNP, FNP-BC  Patient Care Team: Merri Brunette, MD as PCP - General (Internal Medicine)  Extended Emergency Contact Information Primary Emergency Contact: Whetzel,Jean Address: [address]          [place], [place] [address] [place] Home Phone: [phone] Mobile Phone: [phone] Relation: Spouse  Code Status:  FULL CODE  Goals of care: Advanced Directive information Advanced Directives 04/03/2021  Does Patient Have a Medical Advance Directive? No  Would patient like information on creating a medical advance directive? No - Patient declined     Chief Complaint  Patient presents with   Discharge Note    For discharge home on 04/03/21 with Home health PT, OT and Nursing    HPI:  Pt is a 87 y.o. male who is for discharge home on 04/04/2019 with home health PT, OT and nursing.  He was admitted to [facility] on 03/11/21 post hospital admission 03/04/2021 to 03/11/2021.  He has a PMH of chronic kidney disease stage IIIb, insulin-dependent diabetes mellitus type 2, hypertension, aortic stenosis status post aortic valve replacement (05/2013), coronary artery disease post CABG (2 vessel, 2014), gastroesophageal reflux disease, gout, macular degeneration and hyperlipidemia.  He went to [facility] ED on 03/04/2021 for progressive weakness.  He developed an ulceration over the right great toe a month ago.  A week before hospitalization, he began to experience generalized weakness which resulted in the patient falling out of bed without loss of consciousness.  He continued to experience progressive worsening generalized weakness with associated poor appetite and worsening pain of the right foot with associated increasing poor appetite.  He developed worsening pain of the right foot wound with associated increasing purulence and foul smell of  the foot.  On 8/23, he had difficulty sitting up in his chair at dinner and had to be fed by his wife.  He was then brought to the ED on 8/24.  In the ED, creatinine was elevated at 2.41 up from his baseline of approximately 1.8.  He was found to have clinical evidence of dehydration including dry mucous membranes.  Ulceration of the dorsal surface of the great toe of the right foot was x-rayed revealing soft tissue swelling and possible osteomyelitis of the interphalangeal joint.  He was administered doses of intravenous vancomycin and ceftriaxone for suspected osteomyelitis.  Podiatry was consulted and performed amputation of the right great toe on 8/27.  He was then transitioned to Keflex post op and to complete course on 9/4.  Patient was admitted to this facility for short-term rehabilitation after the patient's recent hospitalization.  Patient has completed SNF rehabilitation and therapy has cleared the patient for discharge.   Past Medical History:  Diagnosis Date   Aortic stenosis    BPH (benign prostatic hypertrophy)    Branch retinal vein occlusion 08/01/2013   CAD (coronary artery disease)    CVA (cerebral infarction)    Diabetes mellitus (HCC)    Diabetic retinopathy (HCC)    Gait disturbance    GERD (gastroesophageal reflux disease)    Gout    Heart murmur    HTN (hypertension)    Macular degeneration    Macular edema    Paresthesia    Pseudophakia of right eye    Ptosis    Past Surgical History:  Procedure Laterality Date   AMPUTATION Right 03/07/2021   Procedure: AMPUTATION GREAT TOE;  Surgeon: Felecia Shelling, DPM;  Location: [facility] OR;  Service: Podiatry;  Laterality: Right;   AORTIC VALVE REPLACEMENT  03/26/2013   CATARACT EXTRACTION W/ INTRAOCULAR LENS IMPLANT  11/01/2013   CATARACT EXTRACTION W/ INTRAOCULAR LENS IMPLANT Right 12/18/2009   COLONOSCOPY W/ POLYPECTOMY     CORONARY ARTERY BYPASS GRAFT  03/26/2013   EYE SURGERY  03/19/2016   Ocular tube shunt insertion    PARS PLANA VITRECTOMY  03/19/2016   PARS PLANA VITRECTOMY  11/08/2013   PARS PLANA VITRECTOMY  08/02/2012   PROSTATE SURGERY     TEE WITHOUT CARDIOVERSION N/A 05/02/2017   Procedure: TRANSESOPHAGEAL ECHOCARDIOGRAM (TEE);  Surgeon: Lars Masson, MD;  Location: [facility] ENDOSCOPY;  Service: Cardiovascular;  Laterality: N/A;    Allergies  Allergen Reactions   Gabapentin Other (See Comments)   Glyxambi [Empagliflozin-Linagliptin] Other (See Comments)    Lethargy, slurring speaking.    Heparin     Other reaction(s): Heparin-Induced Thrombocytopenia    Outpatient Encounter Medications as of 04/03/2021  Medication Sig   acetaminophen (TYLENOL) 325 MG tablet Take 2 tablets (650 mg total) by mouth every 6 (six) hours as needed for mild pain (or Fever >/= 101).   bisacodyl (DULCOLAX) 10 MG suppository Place 10 mg rectally as needed for moderate constipation.   Magnesium Hydroxide (MILK OF MAGNESIA PO) Take 30 mLs by mouth as needed.   Multiple Vitamin (MULTIVITAMIN WITH MINERALS) TABS tablet Take 1 tablet by mouth daily.   Sodium Phosphates (RA SALINE ENEMA RE) Place rectally.   [DISCONTINUED] allopurinol (ZYLOPRIM) 100 MG tablet Take 200 mg by mouth at bedtime.    [DISCONTINUED] amLODipine (NORVASC) 10 MG tablet Take 1 tablet (10 mg total) by mouth daily.   [DISCONTINUED] aspirin 81 MG chewable tablet Chew 81 mg by mouth daily.   [DISCONTINUED] docusate sodium (COLACE) 100 MG capsule Take 100 mg by mouth daily.   [DISCONTINUED] furosemide (LASIX) 40 MG tablet Take 40 mg by mouth daily.    [DISCONTINUED] hydrALAZINE (APRESOLINE) 100 MG tablet Take 1 tablet (100 mg total) by mouth 3 (three) times daily.   [DISCONTINUED] insulin glargine (LANTUS) 100 UNIT/ML injection Inject 0.35 mLs (35 Units total) into the skin at bedtime.   [DISCONTINUED] insulin lispro (HUMALOG) 100 UNIT/ML injection Inject 10-16 Units into the skin 3 (three) times daily before meals. Below 200 units = 10units 200-250=12  units 250-300=14units Over 300=16units   [DISCONTINUED] metoprolol (LOPRESSOR) 50 MG tablet Take 50 mg by mouth 2 (two) times daily.   [DISCONTINUED] Omeprazole-Sodium Bicarbonate (ZEGERID) 20-1100 MG CAPS capsule Take 1 capsule by mouth daily before breakfast.   [DISCONTINUED] rosuvastatin (CRESTOR) 10 MG tablet Take 10 mg by mouth daily.   [DISCONTINUED] senna (SENOKOT) 8.6 MG TABS tablet Take 1 tablet by mouth daily.   [DISCONTINUED] zinc gluconate 50 MG tablet Take 50 mg by mouth daily.   allopurinol (ZYLOPRIM) 100 MG tablet Take 2 tablets (200 mg total) by mouth at bedtime.   amLODipine (NORVASC) 10 MG tablet Take 1 tablet (10 mg total) by mouth daily.   aspirin 81 MG chewable tablet Chew 1 tablet (81 mg total) by mouth daily.   docusate sodium (COLACE) 100 MG capsule Take 1 capsule (100 mg total) by mouth daily.   furosemide (LASIX) 40 MG tablet Take 1 tablet (40 mg total) by mouth daily.   hydrALAZINE (APRESOLINE) 100 MG tablet Take 1 tablet (100 mg total) by mouth 3 (three) times daily.   insulin glargine (LANTUS) 100 UNIT/ML injection Inject 0.35 mLs (35  Units total) into the skin at bedtime.   insulin lispro (HUMALOG) 100 UNIT/ML injection Inject 0.1-0.16 mLs (10-16 Units total) into the skin 3 (three) times daily before meals. Below 200 units = 10units 200-250=12 units 250-300=14units Over 300=16units   metoprolol tartrate (LOPRESSOR) 50 MG tablet Take 1 tablet (50 mg total) by mouth 2 (two) times daily.   Omeprazole-Sodium Bicarbonate (ZEGERID) 20-1100 MG CAPS capsule Take 1 capsule by mouth daily before breakfast.   rosuvastatin (CRESTOR) 10 MG tablet Take 1 tablet (10 mg total) by mouth daily.   senna (SENOKOT) 8.6 MG TABS tablet Take 1 tablet (8.6 mg total) by mouth daily.   zinc gluconate 50 MG tablet Take 1 tablet (50 mg total) by mouth daily.   No facility-administered encounter medications on file as of 04/03/2021.    Review of Systems  GENERAL: No change in appetite,  no fatigue, no weight changes, no fever and chills MOUTH and THROAT: Denies oral discomfort, gingival pain or bleeding RESPIRATORY: no cough, SOB, DOE, wheezing, hemoptysis CARDIAC: No chest pain, edema or palpitations GI: No abdominal pain, diarrhea, constipation, heart burn, nausea or vomiting GU: Denies dysuria, frequency, hematuria, incontinence, or discharge NEUROLOGICAL: Denies dizziness, syncope, numbness, or headache PSYCHIATRIC: Denies feelings of depression or anxiety. No report of hallucinations, insomnia, paranoia, or agitation   Immunization History  Administered Date(s) Administered   Influenza, High Dose Seasonal PF 05/08/2014, 05/20/2015, 04/26/2017   Influenza,inj,quad, With Preservative 04/18/2013   Pneumococcal Conjugate-13 08/25/2017   Pneumococcal Polysaccharide-23 07/18/2004   Tdap 08/25/2017   Pertinent  Health Maintenance Due  Topic Date Due   FOOT EXAM  Never done   OPHTHALMOLOGY EXAM  Never done   INFLUENZA VACCINE  02/09/2021   HEMOGLOBIN A1C  09/05/2021   No flowsheet data found.   Vitals:   04/03/21 1034  BP: (!) 148/71  Pulse: 60  Resp: 18  Temp: (!) 97.3 F (36.3 C)  Weight: 185 lb 12.8 oz (84.3 kg)  Height: 5\' 8"  (1.727 m)   Body mass index is 28.25 kg/m.  Physical Exam  GENERAL APPEARANCE: Well nourished. In no acute distress. Obese SKIN:  Right foot wound with dressing MOUTH and THROAT: Lips are without lesions. Oral mucosa is moist and without lesions.  RESPIRATORY: Breathing is even & unlabored, BS CTAB CARDIAC: RRR, no murmur,no extra heart sounds, Left foot 1+ edema GI: Abdomen soft, normal BS, no masses, no tenderness, NEUROLOGICAL: There is no tremor. Speech is clear. Alert and oriented X 3. PSYCHIATRIC:  Affect and behavior are appropriate  Labs reviewed: Recent Labs    03/05/21 0152 03/06/21 0335 03/07/21 0009 03/09/21 0332 03/11/21 0106 03/13/21 0000  NA 138   < > 138 135 135 142  K 4.2   < > 3.9 4.5 4.2 4.4  CL  104   < > 104 101 103 105  CO2 24   < > 26 24 26  24*  GLUCOSE 111*   < > 178* 241* 188*  --   BUN 22   < > 27* 22 34* 53*  CREATININE 1.95*   < > 1.91* 1.75* 2.14* 2.6*  CALCIUM 10.3   < > 10.0 10.7* 10.4* 10.2  MG 2.1  --   --   --  2.2  --   PHOS  --   --   --   --  3.5  --    < > = values in this interval not displayed.   Recent Labs    03/05/21 0152 03/13/21  0000  AST 31  31 31   ALT 25  26 24   ALKPHOS 62  62 73  BILITOT 1.0  1.0  --   PROT 6.9  7.0  --   ALBUMIN 3.3*  3.4*  --    Recent Labs    03/05/21 0152 03/06/21 0555 03/07/21 0009 03/11/21 0106 03/13/21 0000  WBC 11.1* 10.2 9.5 10.3 10.0  NEUTROABS 7.4 6.6  --   --   --   HGB 15.8 15.7 15.7 16.1 15.1  HCT 48.9 46.8 46.9 47.3 44  MCV 89.4 87.8 87.0 86.0  --   PLT 242 224 240 261 258   No results found for: TSH Lab Results  Component Value Date   HGBA1C 5.2 03/05/2021   Lab Results  Component Value Date   CHOL 177 04/29/2017   HDL 30 (L) 04/29/2017   LDLCALC 113 (H) 04/29/2017   TRIG 171 (H) 04/29/2017   CHOLHDL 5.9 04/29/2017    Significant Diagnostic Results in last 30 days:  CT HEAD WO CONTRAST (05/01/2017)  Result Date: 03/11/2021 CLINICAL DATA:  Encephalopathy EXAM: CT HEAD WITHOUT CONTRAST TECHNIQUE: Contiguous axial images were obtained from the base of the skull through the vertex without intravenous contrast. COMPARISON:  03/04/2021 FINDINGS: Brain: There is no mass, hemorrhage or extra-axial collection. There is generalized atrophy without lobar predilection. Hypodensity of the white matter is most commonly associated with chronic microvascular disease. Vascular: Atherosclerotic calcification of the vertebral and internal carotid arteries at the skull base. No abnormal hyperdensity of the major intracranial arteries or dural venous sinuses. Skull: The visualized skull base, calvarium and extracranial soft tissues are normal. Sinuses/Orbits: No fluid levels or advanced mucosal thickening of the  visualized paranasal sinuses. No mastoid or middle ear effusion. The orbits are normal. IMPRESSION: Generalized atrophy and chronic microvascular ischemia without acute intracranial abnormality. Electronically Signed   By: 03/13/2021 M.D.   On: 03/11/2021 00:40   CT Head Wo Contrast  Result Date: 03/04/2021 CLINICAL DATA:  Increasing weakness EXAM: CT HEAD WITHOUT CONTRAST TECHNIQUE: Contiguous axial images were obtained from the base of the skull through the vertex without intravenous contrast. COMPARISON:  04/25/2017 FINDINGS: Brain: No evidence of acute infarction, hemorrhage, hydrocephalus, extra-axial collection or mass lesion/mass effect. Chronic atrophic and ischemic changes are noted similar to that seen on the prior exam. Vascular: No hyperdense vessel or unexpected calcification. Skull: Normal. Negative for fracture or focal lesion. Sinuses/Orbits: Postsurgical changes in the left orbit are noted stable from the prior exam. Other: None. IMPRESSION: Chronic atrophic and ischemic changes without acute abnormality. Electronically Signed   By: 03/06/2021 M.D.   On: 03/04/2021 20:25   MR TOES RIGHT WO CONTRAST  Result Date: 03/05/2021 CLINICAL DATA:  Foot wound with evidence of bony destruction in the distal aspect of the first proximal phalanx. EXAM: MRI OF THE RIGHT TOES WITHOUT CONTRAST TECHNIQUE: Multiplanar, multisequence MR imaging of the right forefoot was performed. No intravenous contrast was administered. COMPARISON:  Plain film from the previous day. FINDINGS: Bones/Joint/Cartilage In the first proximal and distal phalanges, there are changes of bony edema as well as cortical breakdown in the distal aspect of the first proximal phalanx similar to that seen on prior plain film examination. Given the overlying soft tissue wound and destructive changes seen on plain film these findings are most consistent with osteomyelitis. No other area of bony edema is identified. Ligaments Within normal  limits as visualized. Muscles and Tendons Visualized musculature appears within normal limits.  Soft tissues Soft tissue swelling is noted as well as focal soft tissue wound near the first interphalangeal joint. No focal abscess is seen. Considerable dorsal soft tissue swelling is noted. IMPRESSION: Bony edematous changes within the first proximal and distal phalanges with adjacent soft tissue ulcer most consistent with osteomyelitis. Soft tissue swelling is noted in the dorsal aspect of the forefoot without discrete abscess. Electronically Signed   By: Alcide Clever M.D.   On: 03/05/2021 01:49   DG Foot Complete Right  Result Date: 03/07/2021 CLINICAL DATA:  Post amputation EXAM: RIGHT FOOT COMPLETE - 3+ VIEW COMPARISON:  03/04/2021 FINDINGS: Interval amputation across the first MTP joint. Distal skin staples. Small amount of subcutaneous gas at the surgical site. No radiodense foreign body. Diffuse osteopenia. No fracture or other acute findings. IMPRESSION: Postop changes of first MTP amputation without apparent complication. Electronically Signed   By: Corlis Leak M.D.   On: 03/07/2021 10:28   DG Toe Great Right  Result Date: 03/04/2021 CLINICAL DATA:  Wound to the right first toe.  Looks infected. EXAM: RIGHT GREAT TOE COMPARISON:  None. FINDINGS: Diffuse bone demineralization. Degenerative changes in the interphalangeal joint, first metatarsal phalangeal joint, and first tarsometatarsal joint. Diffuse soft tissue swelling of the right first toe with evidence of focal skin ulceration along the anteromedial aspect. Suggestion of focal periarticular cortical erosion along the lateral base of the distal phalanx. This could indicate osteomyelitis or arthritis. No acute fracture or dislocation. IMPRESSION: Soft tissue swelling and soft tissue defect consistent with ulceration. Periarticular cortical erosion at the interphalangeal joint may represent osteomyelitis or arthritis. Degenerative changes and diffuse  demineralization. Electronically Signed   By: Burman Nieves M.D.   On: 03/04/2021 17:46    Assessment/Plan  1. Osteomyelitis of great toe of right foot (HCC) -  S/P amputation of right great toe on 8/27 -   follow up with podiatry -   will be followed up by Home health Nurse for wound treatment -- zinc gluconate 50 MG tablet; Take 1 tablet (50 mg total) by mouth daily.  Dispense: 30 tablet; Refill: 0  2. Acute renal failure superimposed on stage 3b chronic kidney disease, unspecified acute renal failure type [facility]) Lab Results  Component Value Date   NA 142 03/13/2021   K 4.4 03/13/2021   CO2 24 (A) 03/13/2021   GLUCOSE 188 (H) 03/11/2021   BUN 53 (A) 03/13/2021   CREATININE 2.6 (A) 03/13/2021   CALCIUM 10.2 03/13/2021   GFRNONAA 21.70 03/13/2021   GFRAA 25.16 03/13/2021   -  repeat BMP 04/03/21 Na 140, K 4.5, BUN 36, creatinine 2.0  GFR 32  3. History of CVA (cerebrovascular accident) -  referred to neurology - aspirin 81 MG chewable tablet; Chew 1 tablet (81 mg total) by mouth daily.  Dispense: 30 tablet; Refill: 0 - rosuvastatin (CRESTOR) 10 MG tablet; Take 1 tablet (10 mg total) by mouth daily.  Dispense: 30 tablet; Refill: 0  4. Essential hypertension - metoprolol tartrate (LOPRESSOR) 50 MG tablet; Take 1 tablet (50 mg total) by mouth 2 (two) times daily.  Dispense: 60 tablet; Refill: 0 - amLODipine (NORVASC) 10 MG tablet; Take 1 tablet (10 mg total) by mouth daily.  Dispense: 30 tablet; Refill: 0 - hydrALAZINE (APRESOLINE) 100 MG tablet; Take 1 tablet (100 mg total) by mouth 3 (three) times daily.  Dispense: 90 tablet; Refill: 0  5. History of gout - allopurinol (ZYLOPRIM) 100 MG tablet; Take 2 tablets (200 mg total) by mouth at  bedtime.  Dispense: 60 tablet; Refill: 0  6. Gastroesophageal reflux disease without esophagitis - Omeprazole-Sodium Bicarbonate (ZEGERID) 20-1100 MG CAPS capsule; Take 1 capsule by mouth daily before breakfast.  Dispense: 30 capsule; Refill:  0  7. Type 2 diabetes mellitus with stage 3b chronic kidney disease, with long-term current use of insulin (HCC) Lab Results  Component Value Date   HGBA1C 5.2 03/05/2021   - insulin glargine (LANTUS) 100 UNIT/ML injection; Inject 0.35 mLs (35 Units total) into the skin at bedtime.  Dispense: 10 mL; Refill: 0 - insulin lispro (HUMALOG) 100 UNIT/ML injection; Inject 0.1-0.16 mLs (10-16 Units total) into the skin 3 (three) times daily before meals. Below 200 units = 10units 200-250=12 units 250-300=14units Over 300=16units  Dispense: 10 mL; Refill: 1  8. Generalized weakness -   For home health PT and OT, for therapeutic and strengthening exercises -   Fall precautions  9. Slow transit constipation - senna (SENOKOT) 8.6 MG TABS tablet; Take 1 tablet (8.6 mg total) by mouth daily.  Dispense: 30 tablet; Refill: 0 - docusate sodium (COLACE) 100 MG capsule; Take 1 capsule (100 mg total) by mouth daily.  Dispense: 30 capsule; Refill: 0  10. Lower extremity edema - furosemide (LASIX) 40 MG tablet; Take 1 tablet (40 mg total) by mouth daily.  Dispense: 30 tablet; Refill: 0       I have filled out patient's discharge paperwork and e-prescribed medications.  Patient will have home health PT, OT and Nursing.  DME provided:  None  Total discharge time: Greater than 30 minutes Greater than 50% was spent in counseling and code admission of care.   Discharge time involved coordination of the discharge process with social worker, nursing staff and therapy department. Medical justification for home health services verified.   Kenard Gower, DNP, MSN, FNP-BC [facility] [phone] (Monday-Friday 8:00 a.m. - 5:00 p.m.) [phone] (after hours)

## 2021-04-09 DIAGNOSIS — E1122 Type 2 diabetes mellitus with diabetic chronic kidney disease: Secondary | ICD-10-CM | POA: Diagnosis not present

## 2021-04-09 DIAGNOSIS — Z8739 Personal history of other diseases of the musculoskeletal system and connective tissue: Secondary | ICD-10-CM | POA: Diagnosis not present

## 2021-04-09 DIAGNOSIS — R531 Weakness: Secondary | ICD-10-CM | POA: Diagnosis not present

## 2021-04-09 DIAGNOSIS — E11319 Type 2 diabetes mellitus with unspecified diabetic retinopathy without macular edema: Secondary | ICD-10-CM | POA: Diagnosis not present

## 2021-04-09 DIAGNOSIS — H3530 Unspecified macular degeneration: Secondary | ICD-10-CM | POA: Diagnosis not present

## 2021-04-09 DIAGNOSIS — Z89411 Acquired absence of right great toe: Secondary | ICD-10-CM | POA: Diagnosis not present

## 2021-04-09 DIAGNOSIS — E785 Hyperlipidemia, unspecified: Secondary | ICD-10-CM | POA: Diagnosis not present

## 2021-04-09 DIAGNOSIS — Z955 Presence of coronary angioplasty implant and graft: Secondary | ICD-10-CM | POA: Diagnosis not present

## 2021-04-09 DIAGNOSIS — Z8673 Personal history of transient ischemic attack (TIA), and cerebral infarction without residual deficits: Secondary | ICD-10-CM | POA: Diagnosis not present

## 2021-04-09 DIAGNOSIS — I2510 Atherosclerotic heart disease of native coronary artery without angina pectoris: Secondary | ICD-10-CM | POA: Diagnosis not present

## 2021-04-09 DIAGNOSIS — Z794 Long term (current) use of insulin: Secondary | ICD-10-CM | POA: Diagnosis not present

## 2021-04-09 DIAGNOSIS — Z7982 Long term (current) use of aspirin: Secondary | ICD-10-CM | POA: Diagnosis not present

## 2021-04-09 DIAGNOSIS — Z952 Presence of prosthetic heart valve: Secondary | ICD-10-CM | POA: Diagnosis not present

## 2021-04-09 DIAGNOSIS — M109 Gout, unspecified: Secondary | ICD-10-CM | POA: Diagnosis not present

## 2021-04-09 DIAGNOSIS — Z4781 Encounter for orthopedic aftercare following surgical amputation: Secondary | ICD-10-CM | POA: Diagnosis not present

## 2021-04-09 DIAGNOSIS — N1832 Chronic kidney disease, stage 3b: Secondary | ICD-10-CM | POA: Diagnosis not present

## 2021-04-09 DIAGNOSIS — K219 Gastro-esophageal reflux disease without esophagitis: Secondary | ICD-10-CM | POA: Diagnosis not present

## 2021-04-09 DIAGNOSIS — I129 Hypertensive chronic kidney disease with stage 1 through stage 4 chronic kidney disease, or unspecified chronic kidney disease: Secondary | ICD-10-CM | POA: Diagnosis not present

## 2021-04-13 DIAGNOSIS — N1832 Chronic kidney disease, stage 3b: Secondary | ICD-10-CM | POA: Diagnosis not present

## 2021-04-13 DIAGNOSIS — I2510 Atherosclerotic heart disease of native coronary artery without angina pectoris: Secondary | ICD-10-CM | POA: Diagnosis not present

## 2021-04-13 DIAGNOSIS — Z89411 Acquired absence of right great toe: Secondary | ICD-10-CM | POA: Diagnosis not present

## 2021-04-13 DIAGNOSIS — Z8673 Personal history of transient ischemic attack (TIA), and cerebral infarction without residual deficits: Secondary | ICD-10-CM | POA: Diagnosis not present

## 2021-04-13 DIAGNOSIS — Z952 Presence of prosthetic heart valve: Secondary | ICD-10-CM | POA: Diagnosis not present

## 2021-04-13 DIAGNOSIS — Z8739 Personal history of other diseases of the musculoskeletal system and connective tissue: Secondary | ICD-10-CM | POA: Diagnosis not present

## 2021-04-13 DIAGNOSIS — K219 Gastro-esophageal reflux disease without esophagitis: Secondary | ICD-10-CM | POA: Diagnosis not present

## 2021-04-13 DIAGNOSIS — Z4781 Encounter for orthopedic aftercare following surgical amputation: Secondary | ICD-10-CM | POA: Diagnosis not present

## 2021-04-13 DIAGNOSIS — E785 Hyperlipidemia, unspecified: Secondary | ICD-10-CM | POA: Diagnosis not present

## 2021-04-13 DIAGNOSIS — E1122 Type 2 diabetes mellitus with diabetic chronic kidney disease: Secondary | ICD-10-CM | POA: Diagnosis not present

## 2021-04-13 DIAGNOSIS — Z955 Presence of coronary angioplasty implant and graft: Secondary | ICD-10-CM | POA: Diagnosis not present

## 2021-04-13 DIAGNOSIS — Z794 Long term (current) use of insulin: Secondary | ICD-10-CM | POA: Diagnosis not present

## 2021-04-13 DIAGNOSIS — M109 Gout, unspecified: Secondary | ICD-10-CM | POA: Diagnosis not present

## 2021-04-13 DIAGNOSIS — R531 Weakness: Secondary | ICD-10-CM | POA: Diagnosis not present

## 2021-04-13 DIAGNOSIS — H3530 Unspecified macular degeneration: Secondary | ICD-10-CM | POA: Diagnosis not present

## 2021-04-13 DIAGNOSIS — I129 Hypertensive chronic kidney disease with stage 1 through stage 4 chronic kidney disease, or unspecified chronic kidney disease: Secondary | ICD-10-CM | POA: Diagnosis not present

## 2021-04-13 DIAGNOSIS — E11319 Type 2 diabetes mellitus with unspecified diabetic retinopathy without macular edema: Secondary | ICD-10-CM | POA: Diagnosis not present

## 2021-04-13 DIAGNOSIS — Z7982 Long term (current) use of aspirin: Secondary | ICD-10-CM | POA: Diagnosis not present

## 2021-04-17 DIAGNOSIS — Z952 Presence of prosthetic heart valve: Secondary | ICD-10-CM | POA: Diagnosis not present

## 2021-04-17 DIAGNOSIS — I2510 Atherosclerotic heart disease of native coronary artery without angina pectoris: Secondary | ICD-10-CM | POA: Diagnosis not present

## 2021-04-17 DIAGNOSIS — E785 Hyperlipidemia, unspecified: Secondary | ICD-10-CM | POA: Diagnosis not present

## 2021-04-17 DIAGNOSIS — Z4781 Encounter for orthopedic aftercare following surgical amputation: Secondary | ICD-10-CM | POA: Diagnosis not present

## 2021-04-17 DIAGNOSIS — Z8739 Personal history of other diseases of the musculoskeletal system and connective tissue: Secondary | ICD-10-CM | POA: Diagnosis not present

## 2021-04-17 DIAGNOSIS — M109 Gout, unspecified: Secondary | ICD-10-CM | POA: Diagnosis not present

## 2021-04-17 DIAGNOSIS — Z8673 Personal history of transient ischemic attack (TIA), and cerebral infarction without residual deficits: Secondary | ICD-10-CM | POA: Diagnosis not present

## 2021-04-17 DIAGNOSIS — Z955 Presence of coronary angioplasty implant and graft: Secondary | ICD-10-CM | POA: Diagnosis not present

## 2021-04-17 DIAGNOSIS — Z794 Long term (current) use of insulin: Secondary | ICD-10-CM | POA: Diagnosis not present

## 2021-04-17 DIAGNOSIS — I129 Hypertensive chronic kidney disease with stage 1 through stage 4 chronic kidney disease, or unspecified chronic kidney disease: Secondary | ICD-10-CM | POA: Diagnosis not present

## 2021-04-17 DIAGNOSIS — Z7982 Long term (current) use of aspirin: Secondary | ICD-10-CM | POA: Diagnosis not present

## 2021-04-17 DIAGNOSIS — R531 Weakness: Secondary | ICD-10-CM | POA: Diagnosis not present

## 2021-04-17 DIAGNOSIS — N1832 Chronic kidney disease, stage 3b: Secondary | ICD-10-CM | POA: Diagnosis not present

## 2021-04-17 DIAGNOSIS — E1122 Type 2 diabetes mellitus with diabetic chronic kidney disease: Secondary | ICD-10-CM | POA: Diagnosis not present

## 2021-04-17 DIAGNOSIS — Z89411 Acquired absence of right great toe: Secondary | ICD-10-CM | POA: Diagnosis not present

## 2021-04-17 DIAGNOSIS — K219 Gastro-esophageal reflux disease without esophagitis: Secondary | ICD-10-CM | POA: Diagnosis not present

## 2021-04-17 DIAGNOSIS — H3530 Unspecified macular degeneration: Secondary | ICD-10-CM | POA: Diagnosis not present

## 2021-04-17 DIAGNOSIS — E11319 Type 2 diabetes mellitus with unspecified diabetic retinopathy without macular edema: Secondary | ICD-10-CM | POA: Diagnosis not present

## 2021-04-20 DIAGNOSIS — E11319 Type 2 diabetes mellitus with unspecified diabetic retinopathy without macular edema: Secondary | ICD-10-CM | POA: Diagnosis not present

## 2021-04-20 DIAGNOSIS — Z955 Presence of coronary angioplasty implant and graft: Secondary | ICD-10-CM | POA: Diagnosis not present

## 2021-04-20 DIAGNOSIS — E785 Hyperlipidemia, unspecified: Secondary | ICD-10-CM | POA: Diagnosis not present

## 2021-04-20 DIAGNOSIS — N1832 Chronic kidney disease, stage 3b: Secondary | ICD-10-CM | POA: Diagnosis not present

## 2021-04-20 DIAGNOSIS — I2510 Atherosclerotic heart disease of native coronary artery without angina pectoris: Secondary | ICD-10-CM | POA: Diagnosis not present

## 2021-04-20 DIAGNOSIS — Z89411 Acquired absence of right great toe: Secondary | ICD-10-CM | POA: Diagnosis not present

## 2021-04-20 DIAGNOSIS — M109 Gout, unspecified: Secondary | ICD-10-CM | POA: Diagnosis not present

## 2021-04-20 DIAGNOSIS — Z8739 Personal history of other diseases of the musculoskeletal system and connective tissue: Secondary | ICD-10-CM | POA: Diagnosis not present

## 2021-04-20 DIAGNOSIS — Z794 Long term (current) use of insulin: Secondary | ICD-10-CM | POA: Diagnosis not present

## 2021-04-20 DIAGNOSIS — Z4781 Encounter for orthopedic aftercare following surgical amputation: Secondary | ICD-10-CM | POA: Diagnosis not present

## 2021-04-20 DIAGNOSIS — E1122 Type 2 diabetes mellitus with diabetic chronic kidney disease: Secondary | ICD-10-CM | POA: Diagnosis not present

## 2021-04-20 DIAGNOSIS — K219 Gastro-esophageal reflux disease without esophagitis: Secondary | ICD-10-CM | POA: Diagnosis not present

## 2021-04-20 DIAGNOSIS — R531 Weakness: Secondary | ICD-10-CM | POA: Diagnosis not present

## 2021-04-20 DIAGNOSIS — Z8673 Personal history of transient ischemic attack (TIA), and cerebral infarction without residual deficits: Secondary | ICD-10-CM | POA: Diagnosis not present

## 2021-04-20 DIAGNOSIS — Z7982 Long term (current) use of aspirin: Secondary | ICD-10-CM | POA: Diagnosis not present

## 2021-04-20 DIAGNOSIS — H3530 Unspecified macular degeneration: Secondary | ICD-10-CM | POA: Diagnosis not present

## 2021-04-20 DIAGNOSIS — I129 Hypertensive chronic kidney disease with stage 1 through stage 4 chronic kidney disease, or unspecified chronic kidney disease: Secondary | ICD-10-CM | POA: Diagnosis not present

## 2021-04-20 DIAGNOSIS — Z952 Presence of prosthetic heart valve: Secondary | ICD-10-CM | POA: Diagnosis not present

## 2021-04-22 DIAGNOSIS — Z8739 Personal history of other diseases of the musculoskeletal system and connective tissue: Secondary | ICD-10-CM | POA: Diagnosis not present

## 2021-04-22 DIAGNOSIS — Z8673 Personal history of transient ischemic attack (TIA), and cerebral infarction without residual deficits: Secondary | ICD-10-CM | POA: Diagnosis not present

## 2021-04-22 DIAGNOSIS — K219 Gastro-esophageal reflux disease without esophagitis: Secondary | ICD-10-CM | POA: Diagnosis not present

## 2021-04-22 DIAGNOSIS — Z952 Presence of prosthetic heart valve: Secondary | ICD-10-CM | POA: Diagnosis not present

## 2021-04-22 DIAGNOSIS — R531 Weakness: Secondary | ICD-10-CM | POA: Diagnosis not present

## 2021-04-22 DIAGNOSIS — E11319 Type 2 diabetes mellitus with unspecified diabetic retinopathy without macular edema: Secondary | ICD-10-CM | POA: Diagnosis not present

## 2021-04-22 DIAGNOSIS — Z794 Long term (current) use of insulin: Secondary | ICD-10-CM | POA: Diagnosis not present

## 2021-04-22 DIAGNOSIS — I2510 Atherosclerotic heart disease of native coronary artery without angina pectoris: Secondary | ICD-10-CM | POA: Diagnosis not present

## 2021-04-22 DIAGNOSIS — H3530 Unspecified macular degeneration: Secondary | ICD-10-CM | POA: Diagnosis not present

## 2021-04-22 DIAGNOSIS — E785 Hyperlipidemia, unspecified: Secondary | ICD-10-CM | POA: Diagnosis not present

## 2021-04-22 DIAGNOSIS — M109 Gout, unspecified: Secondary | ICD-10-CM | POA: Diagnosis not present

## 2021-04-22 DIAGNOSIS — E1122 Type 2 diabetes mellitus with diabetic chronic kidney disease: Secondary | ICD-10-CM | POA: Diagnosis not present

## 2021-04-22 DIAGNOSIS — Z7982 Long term (current) use of aspirin: Secondary | ICD-10-CM | POA: Diagnosis not present

## 2021-04-22 DIAGNOSIS — I129 Hypertensive chronic kidney disease with stage 1 through stage 4 chronic kidney disease, or unspecified chronic kidney disease: Secondary | ICD-10-CM | POA: Diagnosis not present

## 2021-04-22 DIAGNOSIS — N1832 Chronic kidney disease, stage 3b: Secondary | ICD-10-CM | POA: Diagnosis not present

## 2021-04-22 DIAGNOSIS — Z955 Presence of coronary angioplasty implant and graft: Secondary | ICD-10-CM | POA: Diagnosis not present

## 2021-04-22 DIAGNOSIS — Z4781 Encounter for orthopedic aftercare following surgical amputation: Secondary | ICD-10-CM | POA: Diagnosis not present

## 2021-04-22 DIAGNOSIS — Z89411 Acquired absence of right great toe: Secondary | ICD-10-CM | POA: Diagnosis not present

## 2021-04-23 DIAGNOSIS — H918X3 Other specified hearing loss, bilateral: Secondary | ICD-10-CM | POA: Diagnosis not present

## 2021-04-23 DIAGNOSIS — S98111A Complete traumatic amputation of right great toe, initial encounter: Secondary | ICD-10-CM | POA: Diagnosis not present

## 2021-04-23 DIAGNOSIS — R29898 Other symptoms and signs involving the musculoskeletal system: Secondary | ICD-10-CM | POA: Diagnosis not present

## 2021-04-24 ENCOUNTER — Other Ambulatory Visit: Payer: Medicare Other

## 2021-04-24 ENCOUNTER — Other Ambulatory Visit: Payer: Self-pay

## 2021-04-24 ENCOUNTER — Other Ambulatory Visit: Payer: Self-pay | Admitting: Internal Medicine

## 2021-04-24 DIAGNOSIS — R29898 Other symptoms and signs involving the musculoskeletal system: Secondary | ICD-10-CM

## 2021-04-27 ENCOUNTER — Other Ambulatory Visit: Payer: Self-pay | Admitting: Adult Health

## 2021-04-27 DIAGNOSIS — Z89411 Acquired absence of right great toe: Secondary | ICD-10-CM | POA: Diagnosis not present

## 2021-04-27 DIAGNOSIS — E11319 Type 2 diabetes mellitus with unspecified diabetic retinopathy without macular edema: Secondary | ICD-10-CM | POA: Diagnosis not present

## 2021-04-27 DIAGNOSIS — Z4781 Encounter for orthopedic aftercare following surgical amputation: Secondary | ICD-10-CM | POA: Diagnosis not present

## 2021-04-27 DIAGNOSIS — K219 Gastro-esophageal reflux disease without esophagitis: Secondary | ICD-10-CM | POA: Diagnosis not present

## 2021-04-27 DIAGNOSIS — R531 Weakness: Secondary | ICD-10-CM | POA: Diagnosis not present

## 2021-04-27 DIAGNOSIS — N1832 Chronic kidney disease, stage 3b: Secondary | ICD-10-CM | POA: Diagnosis not present

## 2021-04-27 DIAGNOSIS — Z8673 Personal history of transient ischemic attack (TIA), and cerebral infarction without residual deficits: Secondary | ICD-10-CM | POA: Diagnosis not present

## 2021-04-27 DIAGNOSIS — I2510 Atherosclerotic heart disease of native coronary artery without angina pectoris: Secondary | ICD-10-CM | POA: Diagnosis not present

## 2021-04-27 DIAGNOSIS — H3530 Unspecified macular degeneration: Secondary | ICD-10-CM | POA: Diagnosis not present

## 2021-04-27 DIAGNOSIS — Z955 Presence of coronary angioplasty implant and graft: Secondary | ICD-10-CM | POA: Diagnosis not present

## 2021-04-27 DIAGNOSIS — E785 Hyperlipidemia, unspecified: Secondary | ICD-10-CM | POA: Diagnosis not present

## 2021-04-27 DIAGNOSIS — Z8739 Personal history of other diseases of the musculoskeletal system and connective tissue: Secondary | ICD-10-CM | POA: Diagnosis not present

## 2021-04-27 DIAGNOSIS — Z794 Long term (current) use of insulin: Secondary | ICD-10-CM | POA: Diagnosis not present

## 2021-04-27 DIAGNOSIS — M109 Gout, unspecified: Secondary | ICD-10-CM | POA: Diagnosis not present

## 2021-04-27 DIAGNOSIS — E1122 Type 2 diabetes mellitus with diabetic chronic kidney disease: Secondary | ICD-10-CM | POA: Diagnosis not present

## 2021-04-27 DIAGNOSIS — R600 Localized edema: Secondary | ICD-10-CM

## 2021-04-27 DIAGNOSIS — Z7982 Long term (current) use of aspirin: Secondary | ICD-10-CM | POA: Diagnosis not present

## 2021-04-27 DIAGNOSIS — I129 Hypertensive chronic kidney disease with stage 1 through stage 4 chronic kidney disease, or unspecified chronic kidney disease: Secondary | ICD-10-CM | POA: Diagnosis not present

## 2021-04-27 DIAGNOSIS — Z952 Presence of prosthetic heart valve: Secondary | ICD-10-CM | POA: Diagnosis not present

## 2021-04-28 ENCOUNTER — Telehealth: Payer: Self-pay | Admitting: Podiatry

## 2021-04-28 DIAGNOSIS — I2510 Atherosclerotic heart disease of native coronary artery without angina pectoris: Secondary | ICD-10-CM | POA: Diagnosis not present

## 2021-04-28 DIAGNOSIS — Z952 Presence of prosthetic heart valve: Secondary | ICD-10-CM | POA: Diagnosis not present

## 2021-04-28 DIAGNOSIS — Z4781 Encounter for orthopedic aftercare following surgical amputation: Secondary | ICD-10-CM | POA: Diagnosis not present

## 2021-04-28 DIAGNOSIS — Z89411 Acquired absence of right great toe: Secondary | ICD-10-CM | POA: Diagnosis not present

## 2021-04-28 DIAGNOSIS — R531 Weakness: Secondary | ICD-10-CM | POA: Diagnosis not present

## 2021-04-28 DIAGNOSIS — Z7982 Long term (current) use of aspirin: Secondary | ICD-10-CM | POA: Diagnosis not present

## 2021-04-28 DIAGNOSIS — N1832 Chronic kidney disease, stage 3b: Secondary | ICD-10-CM | POA: Diagnosis not present

## 2021-04-28 DIAGNOSIS — E785 Hyperlipidemia, unspecified: Secondary | ICD-10-CM | POA: Diagnosis not present

## 2021-04-28 DIAGNOSIS — E11319 Type 2 diabetes mellitus with unspecified diabetic retinopathy without macular edema: Secondary | ICD-10-CM | POA: Diagnosis not present

## 2021-04-28 DIAGNOSIS — K219 Gastro-esophageal reflux disease without esophagitis: Secondary | ICD-10-CM | POA: Diagnosis not present

## 2021-04-28 DIAGNOSIS — E1122 Type 2 diabetes mellitus with diabetic chronic kidney disease: Secondary | ICD-10-CM | POA: Diagnosis not present

## 2021-04-28 DIAGNOSIS — Z794 Long term (current) use of insulin: Secondary | ICD-10-CM | POA: Diagnosis not present

## 2021-04-28 DIAGNOSIS — Z8673 Personal history of transient ischemic attack (TIA), and cerebral infarction without residual deficits: Secondary | ICD-10-CM | POA: Diagnosis not present

## 2021-04-28 DIAGNOSIS — Z955 Presence of coronary angioplasty implant and graft: Secondary | ICD-10-CM | POA: Diagnosis not present

## 2021-04-28 DIAGNOSIS — H3530 Unspecified macular degeneration: Secondary | ICD-10-CM | POA: Diagnosis not present

## 2021-04-28 DIAGNOSIS — I129 Hypertensive chronic kidney disease with stage 1 through stage 4 chronic kidney disease, or unspecified chronic kidney disease: Secondary | ICD-10-CM | POA: Diagnosis not present

## 2021-04-28 DIAGNOSIS — M109 Gout, unspecified: Secondary | ICD-10-CM | POA: Diagnosis not present

## 2021-04-28 DIAGNOSIS — Z8739 Personal history of other diseases of the musculoskeletal system and connective tissue: Secondary | ICD-10-CM | POA: Diagnosis not present

## 2021-04-28 NOTE — Telephone Encounter (Signed)
Per Selena Batten @ [facility] insurance the L5000 no Berkley Harvey is required.. ref # [id number].

## 2021-04-29 DIAGNOSIS — E11319 Type 2 diabetes mellitus with unspecified diabetic retinopathy without macular edema: Secondary | ICD-10-CM | POA: Diagnosis not present

## 2021-04-29 DIAGNOSIS — I2510 Atherosclerotic heart disease of native coronary artery without angina pectoris: Secondary | ICD-10-CM | POA: Diagnosis not present

## 2021-04-29 DIAGNOSIS — Z8673 Personal history of transient ischemic attack (TIA), and cerebral infarction without residual deficits: Secondary | ICD-10-CM | POA: Diagnosis not present

## 2021-04-29 DIAGNOSIS — K219 Gastro-esophageal reflux disease without esophagitis: Secondary | ICD-10-CM | POA: Diagnosis not present

## 2021-04-29 DIAGNOSIS — M109 Gout, unspecified: Secondary | ICD-10-CM | POA: Diagnosis not present

## 2021-04-29 DIAGNOSIS — E785 Hyperlipidemia, unspecified: Secondary | ICD-10-CM | POA: Diagnosis not present

## 2021-04-29 DIAGNOSIS — E1122 Type 2 diabetes mellitus with diabetic chronic kidney disease: Secondary | ICD-10-CM | POA: Diagnosis not present

## 2021-04-29 DIAGNOSIS — Z955 Presence of coronary angioplasty implant and graft: Secondary | ICD-10-CM | POA: Diagnosis not present

## 2021-04-29 DIAGNOSIS — Z8739 Personal history of other diseases of the musculoskeletal system and connective tissue: Secondary | ICD-10-CM | POA: Diagnosis not present

## 2021-04-29 DIAGNOSIS — N1832 Chronic kidney disease, stage 3b: Secondary | ICD-10-CM | POA: Diagnosis not present

## 2021-04-29 DIAGNOSIS — Z7982 Long term (current) use of aspirin: Secondary | ICD-10-CM | POA: Diagnosis not present

## 2021-04-29 DIAGNOSIS — Z952 Presence of prosthetic heart valve: Secondary | ICD-10-CM | POA: Diagnosis not present

## 2021-04-29 DIAGNOSIS — I129 Hypertensive chronic kidney disease with stage 1 through stage 4 chronic kidney disease, or unspecified chronic kidney disease: Secondary | ICD-10-CM | POA: Diagnosis not present

## 2021-04-29 DIAGNOSIS — Z4781 Encounter for orthopedic aftercare following surgical amputation: Secondary | ICD-10-CM | POA: Diagnosis not present

## 2021-04-29 DIAGNOSIS — Z89411 Acquired absence of right great toe: Secondary | ICD-10-CM | POA: Diagnosis not present

## 2021-04-29 DIAGNOSIS — R531 Weakness: Secondary | ICD-10-CM | POA: Diagnosis not present

## 2021-04-29 DIAGNOSIS — H3530 Unspecified macular degeneration: Secondary | ICD-10-CM | POA: Diagnosis not present

## 2021-04-29 DIAGNOSIS — Z794 Long term (current) use of insulin: Secondary | ICD-10-CM | POA: Diagnosis not present

## 2021-04-30 ENCOUNTER — Other Ambulatory Visit: Payer: Self-pay | Admitting: Adult Health

## 2021-04-30 DIAGNOSIS — N1832 Chronic kidney disease, stage 3b: Secondary | ICD-10-CM

## 2021-04-30 DIAGNOSIS — E1122 Type 2 diabetes mellitus with diabetic chronic kidney disease: Secondary | ICD-10-CM

## 2021-05-04 ENCOUNTER — Encounter: Payer: Medicare Other | Admitting: Podiatry

## 2021-05-05 ENCOUNTER — Other Ambulatory Visit: Payer: Self-pay

## 2021-05-05 ENCOUNTER — Ambulatory Visit (INDEPENDENT_AMBULATORY_CARE_PROVIDER_SITE_OTHER): Payer: Medicare Other | Admitting: Podiatry

## 2021-05-05 DIAGNOSIS — M869 Osteomyelitis, unspecified: Secondary | ICD-10-CM

## 2021-05-05 DIAGNOSIS — E1122 Type 2 diabetes mellitus with diabetic chronic kidney disease: Secondary | ICD-10-CM | POA: Diagnosis not present

## 2021-05-05 DIAGNOSIS — N1832 Chronic kidney disease, stage 3b: Secondary | ICD-10-CM | POA: Diagnosis not present

## 2021-05-05 DIAGNOSIS — Z89411 Acquired absence of right great toe: Secondary | ICD-10-CM | POA: Diagnosis not present

## 2021-05-05 DIAGNOSIS — Z794 Long term (current) use of insulin: Secondary | ICD-10-CM

## 2021-05-05 MED ORDER — KETOCONAZOLE 2 % EX CREA: 1.0000 "application " | TOPICAL_CREAM | Freq: Every day | CUTANEOUS | 2 refills | Status: DC

## 2021-05-06 ENCOUNTER — Other Ambulatory Visit: Payer: Self-pay | Admitting: Adult Health

## 2021-05-06 DIAGNOSIS — Z952 Presence of prosthetic heart valve: Secondary | ICD-10-CM | POA: Diagnosis not present

## 2021-05-06 DIAGNOSIS — Z8739 Personal history of other diseases of the musculoskeletal system and connective tissue: Secondary | ICD-10-CM | POA: Diagnosis not present

## 2021-05-06 DIAGNOSIS — E785 Hyperlipidemia, unspecified: Secondary | ICD-10-CM | POA: Diagnosis not present

## 2021-05-06 DIAGNOSIS — I129 Hypertensive chronic kidney disease with stage 1 through stage 4 chronic kidney disease, or unspecified chronic kidney disease: Secondary | ICD-10-CM | POA: Diagnosis not present

## 2021-05-06 DIAGNOSIS — Z794 Long term (current) use of insulin: Secondary | ICD-10-CM | POA: Diagnosis not present

## 2021-05-06 DIAGNOSIS — H3530 Unspecified macular degeneration: Secondary | ICD-10-CM | POA: Diagnosis not present

## 2021-05-06 DIAGNOSIS — R531 Weakness: Secondary | ICD-10-CM | POA: Diagnosis not present

## 2021-05-06 DIAGNOSIS — Z4781 Encounter for orthopedic aftercare following surgical amputation: Secondary | ICD-10-CM | POA: Diagnosis not present

## 2021-05-06 DIAGNOSIS — Z7982 Long term (current) use of aspirin: Secondary | ICD-10-CM | POA: Diagnosis not present

## 2021-05-06 DIAGNOSIS — N1832 Chronic kidney disease, stage 3b: Secondary | ICD-10-CM | POA: Diagnosis not present

## 2021-05-06 DIAGNOSIS — I10 Essential (primary) hypertension: Secondary | ICD-10-CM

## 2021-05-06 DIAGNOSIS — R600 Localized edema: Secondary | ICD-10-CM

## 2021-05-06 DIAGNOSIS — I2510 Atherosclerotic heart disease of native coronary artery without angina pectoris: Secondary | ICD-10-CM | POA: Diagnosis not present

## 2021-05-06 DIAGNOSIS — K219 Gastro-esophageal reflux disease without esophagitis: Secondary | ICD-10-CM | POA: Diagnosis not present

## 2021-05-06 DIAGNOSIS — Z955 Presence of coronary angioplasty implant and graft: Secondary | ICD-10-CM | POA: Diagnosis not present

## 2021-05-06 DIAGNOSIS — E11319 Type 2 diabetes mellitus with unspecified diabetic retinopathy without macular edema: Secondary | ICD-10-CM | POA: Diagnosis not present

## 2021-05-06 DIAGNOSIS — M109 Gout, unspecified: Secondary | ICD-10-CM | POA: Diagnosis not present

## 2021-05-06 DIAGNOSIS — E1122 Type 2 diabetes mellitus with diabetic chronic kidney disease: Secondary | ICD-10-CM | POA: Diagnosis not present

## 2021-05-06 DIAGNOSIS — Z89411 Acquired absence of right great toe: Secondary | ICD-10-CM | POA: Diagnosis not present

## 2021-05-06 DIAGNOSIS — Z8673 Personal history of transient ischemic attack (TIA), and cerebral infarction without residual deficits: Secondary | ICD-10-CM | POA: Diagnosis not present

## 2021-05-07 DIAGNOSIS — I129 Hypertensive chronic kidney disease with stage 1 through stage 4 chronic kidney disease, or unspecified chronic kidney disease: Secondary | ICD-10-CM | POA: Diagnosis not present

## 2021-05-07 DIAGNOSIS — K219 Gastro-esophageal reflux disease without esophagitis: Secondary | ICD-10-CM | POA: Diagnosis not present

## 2021-05-07 DIAGNOSIS — M109 Gout, unspecified: Secondary | ICD-10-CM | POA: Diagnosis not present

## 2021-05-07 DIAGNOSIS — Z955 Presence of coronary angioplasty implant and graft: Secondary | ICD-10-CM | POA: Diagnosis not present

## 2021-05-07 DIAGNOSIS — N1832 Chronic kidney disease, stage 3b: Secondary | ICD-10-CM | POA: Diagnosis not present

## 2021-05-07 DIAGNOSIS — H3530 Unspecified macular degeneration: Secondary | ICD-10-CM | POA: Diagnosis not present

## 2021-05-07 DIAGNOSIS — Z794 Long term (current) use of insulin: Secondary | ICD-10-CM | POA: Diagnosis not present

## 2021-05-07 DIAGNOSIS — E785 Hyperlipidemia, unspecified: Secondary | ICD-10-CM | POA: Diagnosis not present

## 2021-05-07 DIAGNOSIS — Z4781 Encounter for orthopedic aftercare following surgical amputation: Secondary | ICD-10-CM | POA: Diagnosis not present

## 2021-05-07 DIAGNOSIS — Z8673 Personal history of transient ischemic attack (TIA), and cerebral infarction without residual deficits: Secondary | ICD-10-CM | POA: Diagnosis not present

## 2021-05-07 DIAGNOSIS — Z952 Presence of prosthetic heart valve: Secondary | ICD-10-CM | POA: Diagnosis not present

## 2021-05-07 DIAGNOSIS — R531 Weakness: Secondary | ICD-10-CM | POA: Diagnosis not present

## 2021-05-07 DIAGNOSIS — Z8739 Personal history of other diseases of the musculoskeletal system and connective tissue: Secondary | ICD-10-CM | POA: Diagnosis not present

## 2021-05-07 DIAGNOSIS — I2510 Atherosclerotic heart disease of native coronary artery without angina pectoris: Secondary | ICD-10-CM | POA: Diagnosis not present

## 2021-05-07 DIAGNOSIS — E1122 Type 2 diabetes mellitus with diabetic chronic kidney disease: Secondary | ICD-10-CM | POA: Diagnosis not present

## 2021-05-07 DIAGNOSIS — Z89411 Acquired absence of right great toe: Secondary | ICD-10-CM | POA: Diagnosis not present

## 2021-05-07 DIAGNOSIS — E11319 Type 2 diabetes mellitus with unspecified diabetic retinopathy without macular edema: Secondary | ICD-10-CM | POA: Diagnosis not present

## 2021-05-07 DIAGNOSIS — Z7982 Long term (current) use of aspirin: Secondary | ICD-10-CM | POA: Diagnosis not present

## 2021-05-11 NOTE — Progress Notes (Signed)
  Subjective:  Patient ID: Johnny [name], male    DOB: 07-May-1933,  MRN: [id number]  Chief Complaint  Patient presents with   Nail Problem    Routine foot care       87 y.o. male returns for post-op check.  Doing well he is here with his wife today. Feet feel very itchy   Review of Systems: Negative except as noted in the HPI. Denies N/V/F/Ch.   Objective:  There were no vitals filed for this visit. There is no height or weight on file to calculate BMI. Constitutional Well developed. Well nourished.  Vascular Foot warm and well perfused. Capillary refill normal to all digits.   Neurologic Normal speech. Oriented to person, place, and time. Epicritic sensation to light touch grossly reduced bilaterally.  Dermatologic Skin healing well without signs of infection. Skin edges well coapted without signs of infection. Tinea pedis plantar foot and interdigitally   Orthopedic: Tenderness to palpation noted about the surgical site.   Assessment:   1. History of amputation of right great toe (HCC)   2. Type 2 diabetes mellitus with stage 3b chronic kidney disease, with long-term current use of insulin (HCC)   3. Osteomyelitis of great toe of right foot (HCC)     Plan:  Patient was evaluated and treated and all questions answered.  S/p foot surgery right -Still awaiting shoe filler to arrive and dispense, will be helpful to prevent transfer ulceration   Discussed the etiology and treatment options for tinea pedis.  Discussed topical and oral treatment.  Recommended topical treatment with 2% ketoconazole cream.  This was sent to the patient's pharmacy.  Also discussed appropriate foot hygiene, use of antifungal spray such as Tinactin in shoes, as well as cleaning foot surfaces such as showers and bathroom floors with bleach.   No follow-ups on file.

## 2021-05-12 DIAGNOSIS — Z955 Presence of coronary angioplasty implant and graft: Secondary | ICD-10-CM | POA: Diagnosis not present

## 2021-05-12 DIAGNOSIS — H3530 Unspecified macular degeneration: Secondary | ICD-10-CM | POA: Diagnosis not present

## 2021-05-12 DIAGNOSIS — Z8673 Personal history of transient ischemic attack (TIA), and cerebral infarction without residual deficits: Secondary | ICD-10-CM | POA: Diagnosis not present

## 2021-05-12 DIAGNOSIS — E11319 Type 2 diabetes mellitus with unspecified diabetic retinopathy without macular edema: Secondary | ICD-10-CM | POA: Diagnosis not present

## 2021-05-12 DIAGNOSIS — M109 Gout, unspecified: Secondary | ICD-10-CM | POA: Diagnosis not present

## 2021-05-12 DIAGNOSIS — I2510 Atherosclerotic heart disease of native coronary artery without angina pectoris: Secondary | ICD-10-CM | POA: Diagnosis not present

## 2021-05-12 DIAGNOSIS — Z794 Long term (current) use of insulin: Secondary | ICD-10-CM | POA: Diagnosis not present

## 2021-05-12 DIAGNOSIS — I129 Hypertensive chronic kidney disease with stage 1 through stage 4 chronic kidney disease, or unspecified chronic kidney disease: Secondary | ICD-10-CM | POA: Diagnosis not present

## 2021-05-12 DIAGNOSIS — E785 Hyperlipidemia, unspecified: Secondary | ICD-10-CM | POA: Diagnosis not present

## 2021-05-12 DIAGNOSIS — R531 Weakness: Secondary | ICD-10-CM | POA: Diagnosis not present

## 2021-05-12 DIAGNOSIS — K219 Gastro-esophageal reflux disease without esophagitis: Secondary | ICD-10-CM | POA: Diagnosis not present

## 2021-05-12 DIAGNOSIS — N1832 Chronic kidney disease, stage 3b: Secondary | ICD-10-CM | POA: Diagnosis not present

## 2021-05-12 DIAGNOSIS — Z4781 Encounter for orthopedic aftercare following surgical amputation: Secondary | ICD-10-CM | POA: Diagnosis not present

## 2021-05-12 DIAGNOSIS — Z8739 Personal history of other diseases of the musculoskeletal system and connective tissue: Secondary | ICD-10-CM | POA: Diagnosis not present

## 2021-05-12 DIAGNOSIS — Z7982 Long term (current) use of aspirin: Secondary | ICD-10-CM | POA: Diagnosis not present

## 2021-05-12 DIAGNOSIS — Z952 Presence of prosthetic heart valve: Secondary | ICD-10-CM | POA: Diagnosis not present

## 2021-05-12 DIAGNOSIS — Z89411 Acquired absence of right great toe: Secondary | ICD-10-CM | POA: Diagnosis not present

## 2021-05-12 DIAGNOSIS — E1122 Type 2 diabetes mellitus with diabetic chronic kidney disease: Secondary | ICD-10-CM | POA: Diagnosis not present

## 2021-05-13 DIAGNOSIS — Z8673 Personal history of transient ischemic attack (TIA), and cerebral infarction without residual deficits: Secondary | ICD-10-CM | POA: Diagnosis not present

## 2021-05-13 DIAGNOSIS — Z955 Presence of coronary angioplasty implant and graft: Secondary | ICD-10-CM | POA: Diagnosis not present

## 2021-05-13 DIAGNOSIS — E785 Hyperlipidemia, unspecified: Secondary | ICD-10-CM | POA: Diagnosis not present

## 2021-05-13 DIAGNOSIS — E11319 Type 2 diabetes mellitus with unspecified diabetic retinopathy without macular edema: Secondary | ICD-10-CM | POA: Diagnosis not present

## 2021-05-13 DIAGNOSIS — Z794 Long term (current) use of insulin: Secondary | ICD-10-CM | POA: Diagnosis not present

## 2021-05-13 DIAGNOSIS — Z4781 Encounter for orthopedic aftercare following surgical amputation: Secondary | ICD-10-CM | POA: Diagnosis not present

## 2021-05-13 DIAGNOSIS — Z89411 Acquired absence of right great toe: Secondary | ICD-10-CM | POA: Diagnosis not present

## 2021-05-13 DIAGNOSIS — K219 Gastro-esophageal reflux disease without esophagitis: Secondary | ICD-10-CM | POA: Diagnosis not present

## 2021-05-13 DIAGNOSIS — Z952 Presence of prosthetic heart valve: Secondary | ICD-10-CM | POA: Diagnosis not present

## 2021-05-13 DIAGNOSIS — I129 Hypertensive chronic kidney disease with stage 1 through stage 4 chronic kidney disease, or unspecified chronic kidney disease: Secondary | ICD-10-CM | POA: Diagnosis not present

## 2021-05-13 DIAGNOSIS — H3530 Unspecified macular degeneration: Secondary | ICD-10-CM | POA: Diagnosis not present

## 2021-05-13 DIAGNOSIS — M109 Gout, unspecified: Secondary | ICD-10-CM | POA: Diagnosis not present

## 2021-05-13 DIAGNOSIS — R531 Weakness: Secondary | ICD-10-CM | POA: Diagnosis not present

## 2021-05-13 DIAGNOSIS — N1832 Chronic kidney disease, stage 3b: Secondary | ICD-10-CM | POA: Diagnosis not present

## 2021-05-13 DIAGNOSIS — E1122 Type 2 diabetes mellitus with diabetic chronic kidney disease: Secondary | ICD-10-CM | POA: Diagnosis not present

## 2021-05-13 DIAGNOSIS — I2510 Atherosclerotic heart disease of native coronary artery without angina pectoris: Secondary | ICD-10-CM | POA: Diagnosis not present

## 2021-05-13 DIAGNOSIS — Z7982 Long term (current) use of aspirin: Secondary | ICD-10-CM | POA: Diagnosis not present

## 2021-05-13 DIAGNOSIS — Z8739 Personal history of other diseases of the musculoskeletal system and connective tissue: Secondary | ICD-10-CM | POA: Diagnosis not present

## 2021-05-18 DIAGNOSIS — Z794 Long term (current) use of insulin: Secondary | ICD-10-CM | POA: Diagnosis not present

## 2021-05-18 DIAGNOSIS — Z7982 Long term (current) use of aspirin: Secondary | ICD-10-CM | POA: Diagnosis not present

## 2021-05-18 DIAGNOSIS — E785 Hyperlipidemia, unspecified: Secondary | ICD-10-CM | POA: Diagnosis not present

## 2021-05-18 DIAGNOSIS — Z89411 Acquired absence of right great toe: Secondary | ICD-10-CM | POA: Diagnosis not present

## 2021-05-18 DIAGNOSIS — Z8673 Personal history of transient ischemic attack (TIA), and cerebral infarction without residual deficits: Secondary | ICD-10-CM | POA: Diagnosis not present

## 2021-05-18 DIAGNOSIS — N1832 Chronic kidney disease, stage 3b: Secondary | ICD-10-CM | POA: Diagnosis not present

## 2021-05-18 DIAGNOSIS — Z4781 Encounter for orthopedic aftercare following surgical amputation: Secondary | ICD-10-CM | POA: Diagnosis not present

## 2021-05-18 DIAGNOSIS — M109 Gout, unspecified: Secondary | ICD-10-CM | POA: Diagnosis not present

## 2021-05-18 DIAGNOSIS — E11319 Type 2 diabetes mellitus with unspecified diabetic retinopathy without macular edema: Secondary | ICD-10-CM | POA: Diagnosis not present

## 2021-05-18 DIAGNOSIS — K219 Gastro-esophageal reflux disease without esophagitis: Secondary | ICD-10-CM | POA: Diagnosis not present

## 2021-05-18 DIAGNOSIS — R531 Weakness: Secondary | ICD-10-CM | POA: Diagnosis not present

## 2021-05-18 DIAGNOSIS — E1122 Type 2 diabetes mellitus with diabetic chronic kidney disease: Secondary | ICD-10-CM | POA: Diagnosis not present

## 2021-05-18 DIAGNOSIS — H3530 Unspecified macular degeneration: Secondary | ICD-10-CM | POA: Diagnosis not present

## 2021-05-18 DIAGNOSIS — I2510 Atherosclerotic heart disease of native coronary artery without angina pectoris: Secondary | ICD-10-CM | POA: Diagnosis not present

## 2021-05-18 DIAGNOSIS — Z955 Presence of coronary angioplasty implant and graft: Secondary | ICD-10-CM | POA: Diagnosis not present

## 2021-05-18 DIAGNOSIS — Z952 Presence of prosthetic heart valve: Secondary | ICD-10-CM | POA: Diagnosis not present

## 2021-05-18 DIAGNOSIS — I129 Hypertensive chronic kidney disease with stage 1 through stage 4 chronic kidney disease, or unspecified chronic kidney disease: Secondary | ICD-10-CM | POA: Diagnosis not present

## 2021-05-18 DIAGNOSIS — Z8739 Personal history of other diseases of the musculoskeletal system and connective tissue: Secondary | ICD-10-CM | POA: Diagnosis not present

## 2021-05-19 DIAGNOSIS — E785 Hyperlipidemia, unspecified: Secondary | ICD-10-CM | POA: Diagnosis not present

## 2021-05-19 DIAGNOSIS — Z89411 Acquired absence of right great toe: Secondary | ICD-10-CM | POA: Diagnosis not present

## 2021-05-19 DIAGNOSIS — Z4781 Encounter for orthopedic aftercare following surgical amputation: Secondary | ICD-10-CM | POA: Diagnosis not present

## 2021-05-19 DIAGNOSIS — Z7982 Long term (current) use of aspirin: Secondary | ICD-10-CM | POA: Diagnosis not present

## 2021-05-19 DIAGNOSIS — E11319 Type 2 diabetes mellitus with unspecified diabetic retinopathy without macular edema: Secondary | ICD-10-CM | POA: Diagnosis not present

## 2021-05-19 DIAGNOSIS — I129 Hypertensive chronic kidney disease with stage 1 through stage 4 chronic kidney disease, or unspecified chronic kidney disease: Secondary | ICD-10-CM | POA: Diagnosis not present

## 2021-05-19 DIAGNOSIS — Z794 Long term (current) use of insulin: Secondary | ICD-10-CM | POA: Diagnosis not present

## 2021-05-19 DIAGNOSIS — K219 Gastro-esophageal reflux disease without esophagitis: Secondary | ICD-10-CM | POA: Diagnosis not present

## 2021-05-19 DIAGNOSIS — Z8739 Personal history of other diseases of the musculoskeletal system and connective tissue: Secondary | ICD-10-CM | POA: Diagnosis not present

## 2021-05-19 DIAGNOSIS — Z955 Presence of coronary angioplasty implant and graft: Secondary | ICD-10-CM | POA: Diagnosis not present

## 2021-05-19 DIAGNOSIS — R531 Weakness: Secondary | ICD-10-CM | POA: Diagnosis not present

## 2021-05-19 DIAGNOSIS — E1122 Type 2 diabetes mellitus with diabetic chronic kidney disease: Secondary | ICD-10-CM | POA: Diagnosis not present

## 2021-05-19 DIAGNOSIS — M109 Gout, unspecified: Secondary | ICD-10-CM | POA: Diagnosis not present

## 2021-05-19 DIAGNOSIS — I2510 Atherosclerotic heart disease of native coronary artery without angina pectoris: Secondary | ICD-10-CM | POA: Diagnosis not present

## 2021-05-19 DIAGNOSIS — Z8673 Personal history of transient ischemic attack (TIA), and cerebral infarction without residual deficits: Secondary | ICD-10-CM | POA: Diagnosis not present

## 2021-05-19 DIAGNOSIS — Z952 Presence of prosthetic heart valve: Secondary | ICD-10-CM | POA: Diagnosis not present

## 2021-05-19 DIAGNOSIS — N1832 Chronic kidney disease, stage 3b: Secondary | ICD-10-CM | POA: Diagnosis not present

## 2021-05-19 DIAGNOSIS — H3530 Unspecified macular degeneration: Secondary | ICD-10-CM | POA: Diagnosis not present

## 2021-05-27 DIAGNOSIS — K219 Gastro-esophageal reflux disease without esophagitis: Secondary | ICD-10-CM | POA: Diagnosis not present

## 2021-05-27 DIAGNOSIS — I2510 Atherosclerotic heart disease of native coronary artery without angina pectoris: Secondary | ICD-10-CM | POA: Diagnosis not present

## 2021-05-27 DIAGNOSIS — Z952 Presence of prosthetic heart valve: Secondary | ICD-10-CM | POA: Diagnosis not present

## 2021-05-27 DIAGNOSIS — R531 Weakness: Secondary | ICD-10-CM | POA: Diagnosis not present

## 2021-05-27 DIAGNOSIS — E785 Hyperlipidemia, unspecified: Secondary | ICD-10-CM | POA: Diagnosis not present

## 2021-05-27 DIAGNOSIS — Z7982 Long term (current) use of aspirin: Secondary | ICD-10-CM | POA: Diagnosis not present

## 2021-05-27 DIAGNOSIS — I129 Hypertensive chronic kidney disease with stage 1 through stage 4 chronic kidney disease, or unspecified chronic kidney disease: Secondary | ICD-10-CM | POA: Diagnosis not present

## 2021-05-27 DIAGNOSIS — M109 Gout, unspecified: Secondary | ICD-10-CM | POA: Diagnosis not present

## 2021-05-27 DIAGNOSIS — Z8739 Personal history of other diseases of the musculoskeletal system and connective tissue: Secondary | ICD-10-CM | POA: Diagnosis not present

## 2021-05-27 DIAGNOSIS — E1122 Type 2 diabetes mellitus with diabetic chronic kidney disease: Secondary | ICD-10-CM | POA: Diagnosis not present

## 2021-05-27 DIAGNOSIS — Z955 Presence of coronary angioplasty implant and graft: Secondary | ICD-10-CM | POA: Diagnosis not present

## 2021-05-27 DIAGNOSIS — N1832 Chronic kidney disease, stage 3b: Secondary | ICD-10-CM | POA: Diagnosis not present

## 2021-05-27 DIAGNOSIS — Z794 Long term (current) use of insulin: Secondary | ICD-10-CM | POA: Diagnosis not present

## 2021-05-27 DIAGNOSIS — Z4781 Encounter for orthopedic aftercare following surgical amputation: Secondary | ICD-10-CM | POA: Diagnosis not present

## 2021-05-27 DIAGNOSIS — H3530 Unspecified macular degeneration: Secondary | ICD-10-CM | POA: Diagnosis not present

## 2021-05-27 DIAGNOSIS — Z8673 Personal history of transient ischemic attack (TIA), and cerebral infarction without residual deficits: Secondary | ICD-10-CM | POA: Diagnosis not present

## 2021-05-27 DIAGNOSIS — E11319 Type 2 diabetes mellitus with unspecified diabetic retinopathy without macular edema: Secondary | ICD-10-CM | POA: Diagnosis not present

## 2021-05-27 DIAGNOSIS — Z89411 Acquired absence of right great toe: Secondary | ICD-10-CM | POA: Diagnosis not present

## 2021-05-28 DIAGNOSIS — S98111A Complete traumatic amputation of right great toe, initial encounter: Secondary | ICD-10-CM | POA: Diagnosis not present

## 2021-05-28 DIAGNOSIS — I10 Essential (primary) hypertension: Secondary | ICD-10-CM | POA: Diagnosis not present

## 2021-05-29 DIAGNOSIS — Z952 Presence of prosthetic heart valve: Secondary | ICD-10-CM | POA: Diagnosis not present

## 2021-05-29 DIAGNOSIS — Z89411 Acquired absence of right great toe: Secondary | ICD-10-CM | POA: Diagnosis not present

## 2021-05-29 DIAGNOSIS — E11319 Type 2 diabetes mellitus with unspecified diabetic retinopathy without macular edema: Secondary | ICD-10-CM | POA: Diagnosis not present

## 2021-05-29 DIAGNOSIS — E1122 Type 2 diabetes mellitus with diabetic chronic kidney disease: Secondary | ICD-10-CM | POA: Diagnosis not present

## 2021-05-29 DIAGNOSIS — I129 Hypertensive chronic kidney disease with stage 1 through stage 4 chronic kidney disease, or unspecified chronic kidney disease: Secondary | ICD-10-CM | POA: Diagnosis not present

## 2021-05-29 DIAGNOSIS — Z955 Presence of coronary angioplasty implant and graft: Secondary | ICD-10-CM | POA: Diagnosis not present

## 2021-05-29 DIAGNOSIS — Z8673 Personal history of transient ischemic attack (TIA), and cerebral infarction without residual deficits: Secondary | ICD-10-CM | POA: Diagnosis not present

## 2021-05-29 DIAGNOSIS — R531 Weakness: Secondary | ICD-10-CM | POA: Diagnosis not present

## 2021-05-29 DIAGNOSIS — M109 Gout, unspecified: Secondary | ICD-10-CM | POA: Diagnosis not present

## 2021-05-29 DIAGNOSIS — K219 Gastro-esophageal reflux disease without esophagitis: Secondary | ICD-10-CM | POA: Diagnosis not present

## 2021-05-29 DIAGNOSIS — Z8739 Personal history of other diseases of the musculoskeletal system and connective tissue: Secondary | ICD-10-CM | POA: Diagnosis not present

## 2021-05-29 DIAGNOSIS — N1832 Chronic kidney disease, stage 3b: Secondary | ICD-10-CM | POA: Diagnosis not present

## 2021-05-29 DIAGNOSIS — Z4781 Encounter for orthopedic aftercare following surgical amputation: Secondary | ICD-10-CM | POA: Diagnosis not present

## 2021-05-29 DIAGNOSIS — Z794 Long term (current) use of insulin: Secondary | ICD-10-CM | POA: Diagnosis not present

## 2021-05-29 DIAGNOSIS — Z7982 Long term (current) use of aspirin: Secondary | ICD-10-CM | POA: Diagnosis not present

## 2021-05-29 DIAGNOSIS — E785 Hyperlipidemia, unspecified: Secondary | ICD-10-CM | POA: Diagnosis not present

## 2021-05-29 DIAGNOSIS — I2510 Atherosclerotic heart disease of native coronary artery without angina pectoris: Secondary | ICD-10-CM | POA: Diagnosis not present

## 2021-05-29 DIAGNOSIS — H3530 Unspecified macular degeneration: Secondary | ICD-10-CM | POA: Diagnosis not present

## 2021-05-30 ENCOUNTER — Other Ambulatory Visit: Payer: Medicare Other

## 2021-05-30 DIAGNOSIS — Z794 Long term (current) use of insulin: Secondary | ICD-10-CM | POA: Diagnosis not present

## 2021-05-30 DIAGNOSIS — E11319 Type 2 diabetes mellitus with unspecified diabetic retinopathy without macular edema: Secondary | ICD-10-CM | POA: Diagnosis not present

## 2021-06-02 DIAGNOSIS — Z8673 Personal history of transient ischemic attack (TIA), and cerebral infarction without residual deficits: Secondary | ICD-10-CM | POA: Diagnosis not present

## 2021-06-02 DIAGNOSIS — Z89411 Acquired absence of right great toe: Secondary | ICD-10-CM | POA: Diagnosis not present

## 2021-06-02 DIAGNOSIS — Z8739 Personal history of other diseases of the musculoskeletal system and connective tissue: Secondary | ICD-10-CM | POA: Diagnosis not present

## 2021-06-02 DIAGNOSIS — R531 Weakness: Secondary | ICD-10-CM | POA: Diagnosis not present

## 2021-06-02 DIAGNOSIS — H3530 Unspecified macular degeneration: Secondary | ICD-10-CM | POA: Diagnosis not present

## 2021-06-02 DIAGNOSIS — I2510 Atherosclerotic heart disease of native coronary artery without angina pectoris: Secondary | ICD-10-CM | POA: Diagnosis not present

## 2021-06-02 DIAGNOSIS — N1832 Chronic kidney disease, stage 3b: Secondary | ICD-10-CM | POA: Diagnosis not present

## 2021-06-02 DIAGNOSIS — E1122 Type 2 diabetes mellitus with diabetic chronic kidney disease: Secondary | ICD-10-CM | POA: Diagnosis not present

## 2021-06-02 DIAGNOSIS — Z794 Long term (current) use of insulin: Secondary | ICD-10-CM | POA: Diagnosis not present

## 2021-06-02 DIAGNOSIS — K219 Gastro-esophageal reflux disease without esophagitis: Secondary | ICD-10-CM | POA: Diagnosis not present

## 2021-06-02 DIAGNOSIS — M109 Gout, unspecified: Secondary | ICD-10-CM | POA: Diagnosis not present

## 2021-06-02 DIAGNOSIS — E11319 Type 2 diabetes mellitus with unspecified diabetic retinopathy without macular edema: Secondary | ICD-10-CM | POA: Diagnosis not present

## 2021-06-02 DIAGNOSIS — E785 Hyperlipidemia, unspecified: Secondary | ICD-10-CM | POA: Diagnosis not present

## 2021-06-02 DIAGNOSIS — I129 Hypertensive chronic kidney disease with stage 1 through stage 4 chronic kidney disease, or unspecified chronic kidney disease: Secondary | ICD-10-CM | POA: Diagnosis not present

## 2021-06-02 DIAGNOSIS — Z952 Presence of prosthetic heart valve: Secondary | ICD-10-CM | POA: Diagnosis not present

## 2021-06-02 DIAGNOSIS — Z7982 Long term (current) use of aspirin: Secondary | ICD-10-CM | POA: Diagnosis not present

## 2021-06-02 DIAGNOSIS — Z4781 Encounter for orthopedic aftercare following surgical amputation: Secondary | ICD-10-CM | POA: Diagnosis not present

## 2021-06-02 DIAGNOSIS — Z955 Presence of coronary angioplasty implant and graft: Secondary | ICD-10-CM | POA: Diagnosis not present

## 2021-06-17 DIAGNOSIS — Z4781 Encounter for orthopedic aftercare following surgical amputation: Secondary | ICD-10-CM | POA: Diagnosis not present

## 2021-06-17 DIAGNOSIS — I129 Hypertensive chronic kidney disease with stage 1 through stage 4 chronic kidney disease, or unspecified chronic kidney disease: Secondary | ICD-10-CM | POA: Diagnosis not present

## 2021-06-17 DIAGNOSIS — Z89411 Acquired absence of right great toe: Secondary | ICD-10-CM | POA: Diagnosis not present

## 2021-06-17 DIAGNOSIS — R531 Weakness: Secondary | ICD-10-CM | POA: Diagnosis not present

## 2021-06-23 ENCOUNTER — Other Ambulatory Visit: Payer: Self-pay

## 2021-06-23 ENCOUNTER — Ambulatory Visit: Payer: Medicare Other | Admitting: Podiatry

## 2021-06-23 DIAGNOSIS — L97512 Non-pressure chronic ulcer of other part of right foot with fat layer exposed: Secondary | ICD-10-CM | POA: Diagnosis not present

## 2021-06-23 DIAGNOSIS — E0843 Diabetes mellitus due to underlying condition with diabetic autonomic (poly)neuropathy: Secondary | ICD-10-CM | POA: Diagnosis not present

## 2021-06-23 MED ORDER — GENTAMICIN SULFATE 0.1 % EX CREA: 1.0000 "application " | TOPICAL_CREAM | Freq: Two times a day (BID) | CUTANEOUS | 1 refills | Status: DC

## 2021-06-23 NOTE — Progress Notes (Signed)
° °  Subjective:  87 y.o. male with PMHx of diabetes mellitus presenting to the office today with his spouse for evaluation of a wound that developed to the right second toe.  Patient has a history of amputation to the right great toe.  The patient's spouse states that the wound has been present for about 2 weeks now however a few days ago he stubbed his toe against the shower ledge and there was a significant amount of bleeding.  They present for further treatment evaluation   Past Medical History:  Diagnosis Date   Aortic stenosis    BPH (benign prostatic hypertrophy)    Branch retinal vein occlusion 08/01/2013   CAD (coronary artery disease)    CVA (cerebral infarction)    Diabetes mellitus (HCC)    Diabetic retinopathy (HCC)    Gait disturbance    GERD (gastroesophageal reflux disease)    Gout    Heart murmur    HTN (hypertension)    Macular degeneration    Macular edema    Paresthesia    Pseudophakia of right eye    Ptosis        Objective/Physical Exam General: The patient is alert and oriented x3 in no acute distress.  Dermatology:  Wound #1 noted to the dorsal distal portion of the right second toe measuring approximately 0.6 x 0.5 x 0.1 cm (LxWxD).   To the noted ulceration(s), there is no eschar. There is a moderate amount of slough, fibrin, and necrotic tissue noted. Granulation tissue and wound base is red. There is a minimal amount of serosanguineous drainage noted. There is no exposed bone muscle-tendon ligament or joint. There is no malodor. Periwound integrity is intact. Skin is warm, dry and supple bilateral lower extremities.  Vascular: Palpable pedal pulses bilaterally. No edema or erythema noted. Capillary refill within normal limits.  Neurological: Epicritic and protective threshold diminished bilaterally.   Musculoskeletal Exam: History of right great toe amputation.  Healed uneventfully  Assessment: 1.  Ulcer right second toe secondary to diabetes  mellitus 2. diabetes mellitus w/ peripheral neuropathy 3. H/o RT great toe amputation   Plan of Care:  1. Patient was evaluated. 2.  Overall the wound is very superficial and stable with good potential for healing.  No clinical evidence of infection. 3.  Prescription for gentamicin cream.  Apply daily with a Band-Aid 4.  Continue diabetic shoes 5.  Return to clinic in 3 weeks  Felecia Shelling, DPM [facility]  Dr. Felecia Shelling, DPM    [address] [place], [place] [address]                Office [phone]  Fax [phone]

## 2021-06-29 ENCOUNTER — Encounter: Payer: Self-pay | Admitting: *Deleted

## 2021-06-30 ENCOUNTER — Ambulatory Visit: Payer: Medicare Other | Admitting: Diagnostic Neuroimaging

## 2021-07-15 ENCOUNTER — Ambulatory Visit: Payer: Medicare Other | Admitting: Podiatry

## 2021-07-21 ENCOUNTER — Ambulatory Visit (INDEPENDENT_AMBULATORY_CARE_PROVIDER_SITE_OTHER): Payer: Medicare Other

## 2021-07-21 ENCOUNTER — Other Ambulatory Visit: Payer: Self-pay | Admitting: Podiatry

## 2021-07-21 ENCOUNTER — Encounter: Payer: Self-pay | Admitting: Podiatry

## 2021-07-21 ENCOUNTER — Ambulatory Visit (INDEPENDENT_AMBULATORY_CARE_PROVIDER_SITE_OTHER): Payer: Medicare Other | Admitting: Podiatry

## 2021-07-21 ENCOUNTER — Other Ambulatory Visit: Payer: Self-pay

## 2021-07-21 DIAGNOSIS — L97512 Non-pressure chronic ulcer of other part of right foot with fat layer exposed: Secondary | ICD-10-CM | POA: Diagnosis not present

## 2021-07-21 MED ORDER — DOXYCYCLINE HYCLATE 100 MG PO TABS: 100.0000 mg | ORAL_TABLET | Freq: Two times a day (BID) | ORAL | 0 refills | Status: DC

## 2021-07-27 LAB — WOUND CULTURE: Organism ID, Bacteria: NONE SEEN

## 2021-08-02 NOTE — Progress Notes (Signed)
Subjective:  88 y.o. male with PMHx of diabetes mellitus presenting to the office today with his spouse for evaluation of a wound that developed to the right second toe.  Patient has a history of amputation to the right great toe.  The patient's spouse is concerned for a worsening wound.  They have been applying the antibiotic cream with a light dressing daily.  He wears diabetic shoes.  He presents for further treatment and evaluation   Past Medical History:  Diagnosis Date   Aortic stenosis    BPH (benign prostatic hypertrophy)    Branch retinal vein occlusion 08/01/2013   CAD (coronary artery disease)    CVA (cerebral infarction)    Diabetes mellitus (HCC)    Diabetic retinopathy (HCC)    Gait disturbance    GERD (gastroesophageal reflux disease)    Gout    Heart murmur    HTN (hypertension)    Macular degeneration    Macular edema    Paresthesia    Pseudophakia of right eye    Ptosis    Tremor      Objective/Physical Exam General: The patient is alert and oriented x3 in no acute distress.  Dermatology:  Wound #1 noted to the dorsal distal portion of the right second toe measuring approximately 0.6 x 0.5 x 0.2 cm (LxWxD).   The ulcer to the distal tip of the right second toe continues to deteriorate today.  There is extensive amount of fibrotic tissue within the small wound base.  At the moment there is no exposed bone but there is concern for deterioration of the wound.  There is some chronic edema also noted to the toe  Vascular: Palpable pedal pulses bilaterally.  Edema noted to the second toe capillary refill within normal limits.  Neurological: Epicritic and protective threshold diminished bilaterally.   Musculoskeletal Exam: History of right great toe amputation.  Healed uneventfully  Assessment: 1.  Ulcer right second toe secondary to diabetes mellitus 2. diabetes mellitus w/ peripheral neuropathy 3. H/o RT great toe amputation   Plan of Care:  1. Patient  was evaluated.  Medically necessary excisional debridement including subcutaneous tissue was performed using a tissue nipper.  Excisional debridement of all the necrotic nonviable tissue down to healthier bleeding viable tissue was performed with postdebridement measurement same as pre- 2.  Unfortunately the wound has progressed somewhat.  There continues to be no clinical evidence of infection but I am worried about deterioration of the wound despite conservative wound care.  Patient's spouse is especially concerned. 3.  For now we will continue the gentamicin cream. 4.  Due to the deterioration of the wound I did take a culture today and sent for cultures and sensitivity 5.  Prescription for doxycycline 100 mg 2 times daily #20 6.  Return to clinic in 2 weeks.  At this time if the wound is no better and continues to deteriorate we will consider possible amputation of the toe.  The patient has had good healing after amputation and this may be the best for the patient to surgically cured the deteriorating wound  Felecia Shelling, DPM [facility]  Dr. Felecia Shelling, DPM    [address], [place] [address]  Office [phone]  Fax [phone]

## 2021-08-05 ENCOUNTER — Other Ambulatory Visit: Payer: Self-pay

## 2021-08-05 ENCOUNTER — Ambulatory Visit: Payer: Medicare Other | Admitting: Podiatry

## 2021-08-05 DIAGNOSIS — L97512 Non-pressure chronic ulcer of other part of right foot with fat layer exposed: Secondary | ICD-10-CM

## 2021-08-05 DIAGNOSIS — E0843 Diabetes mellitus due to underlying condition with diabetic autonomic (poly)neuropathy: Secondary | ICD-10-CM

## 2021-08-11 NOTE — Progress Notes (Signed)
Subjective:  88 y.o. male with PMHx of diabetes mellitus presenting to the office today with his spouse for follow-up evaluation of an ulcer to the distal tip of the right second toe.  He does have a history of amputation of the right great toe.  Also the patient's diabetic shoes were dispensed today however there is no grip on the certain model of shoes and they are concerned that he would slip and fall in the shoes.  He presents for further treatment and evaluation.  Past Medical History:  Diagnosis Date   Aortic stenosis    BPH (benign prostatic hypertrophy)    Branch retinal vein occlusion 08/01/2013   CAD (coronary artery disease)    CVA (cerebral infarction)    Diabetes mellitus (HCC)    Diabetic retinopathy (HCC)    Gait disturbance    GERD (gastroesophageal reflux disease)    Gout    Heart murmur    HTN (hypertension)    Macular degeneration    Macular edema    Paresthesia    Pseudophakia of right eye    Ptosis    Tremor    Past Surgical History:  Procedure Laterality Date   AMPUTATION Right 03/07/2021   Procedure: AMPUTATION GREAT TOE;  Surgeon: Edrick Kins, DPM;  Location: [place];  Service: Podiatry;  Laterality: Right;   AORTIC VALVE REPLACEMENT  03/26/2013   CATARACT EXTRACTION W/ INTRAOCULAR LENS IMPLANT  11/01/2013   CATARACT EXTRACTION W/ INTRAOCULAR LENS IMPLANT Right 12/18/2009   COLONOSCOPY W/ POLYPECTOMY     CORONARY ARTERY BYPASS GRAFT  03/26/2013   EYE SURGERY  03/19/2016   Ocular tube shunt insertion   PARS PLANA VITRECTOMY  03/19/2016   PARS PLANA VITRECTOMY  11/08/2013   PARS PLANA VITRECTOMY  08/02/2012   PROSTATE SURGERY     TEE WITHOUT CARDIOVERSION N/A 05/02/2017   Procedure: TRANSESOPHAGEAL ECHOCARDIOGRAM (TEE);  Surgeon: Dorothy Spark, MD;  Location: [facility] ENDOSCOPY;  Service: Cardiovascular;  Laterality: N/A;   Allergies  Allergen Reactions   Gabapentin Other (See Comments)   Glyxambi [Empagliflozin-Linagliptin] Other (See Comments)     Lethargy, slurring speaking.    Heparin     Other reaction(s): Heparin-Induced Thrombocytopenia     Objective/Physical Exam General: The patient is alert and oriented x3 in no acute distress.  Dermatology:  Wound #1 noted to the dorsal distal portion of the right second toe measuring approximately 0.5x0.4 x 0.2 cm (LxWxD).  Overall there is significant improvement since last visit  The ulcer to the distal tip of the right second toe is stable today.  There is an improved amount of fibrotic tissue within the small wound base.  At the moment there is no exposed bone.  Vascular: Palpable pedal pulses bilaterally.  Edema noted to the second toe capillary refill within normal limits.  Neurological: Epicritic and protective threshold diminished bilaterally.   Musculoskeletal Exam: History of right great toe amputation.  Healed uneventfully  Assessment: 1.  Ulcer right second toe secondary to diabetes mellitus 2. diabetes mellitus w/ peripheral neuropathy 3. H/o RT great toe amputation   Plan of Care:  1. Patient was evaluated.  Medically necessary excisional debridement including subcutaneous tissue was performed using a tissue nipper.  Excisional debridement of all the necrotic nonviable tissue down to healthier bleeding viable tissue was performed with postdebridement measurement same as pre- 2.  Today the wound appears very stable with some improvement since last visit.  For now we will continue conservative treatment. 3.  Continue gentamicin cream daily 4.  The diabetic shoes were dispensed today however there is no grip on the bottom of the soles of the shoes. To make an appointment with the Pedorthist for possible different shoe selection 5.  Return to clinic in 3 weeks  Edrick Kins, DPM [facility]  Dr. Edrick Kins, DPM    [address], [place] [address]                Office [phone]  Fax [phone]

## 2021-08-19 ENCOUNTER — Ambulatory Visit: Payer: Medicare Other

## 2021-08-19 ENCOUNTER — Ambulatory Visit: Payer: Medicare Other | Admitting: Podiatry

## 2021-08-19 ENCOUNTER — Other Ambulatory Visit: Payer: Self-pay

## 2021-08-19 DIAGNOSIS — Z794 Long term (current) use of insulin: Secondary | ICD-10-CM | POA: Diagnosis not present

## 2021-08-19 DIAGNOSIS — Z89411 Acquired absence of right great toe: Secondary | ICD-10-CM

## 2021-08-19 DIAGNOSIS — E0843 Diabetes mellitus due to underlying condition with diabetic autonomic (poly)neuropathy: Secondary | ICD-10-CM

## 2021-08-19 DIAGNOSIS — L97512 Non-pressure chronic ulcer of other part of right foot with fat layer exposed: Secondary | ICD-10-CM

## 2021-08-19 DIAGNOSIS — E11319 Type 2 diabetes mellitus with unspecified diabetic retinopathy without macular edema: Secondary | ICD-10-CM | POA: Diagnosis not present

## 2021-08-19 DIAGNOSIS — N1832 Chronic kidney disease, stage 3b: Secondary | ICD-10-CM

## 2021-08-19 NOTE — Progress Notes (Signed)
SITUATION Reason for Consult: Follow-up with diabetic shoes and insoles Patient / Caregiver Report: Insoles fit fine but shoe tread is too slick  OBJECTIVE DATA History / Diagnosis:    ICD-10-CM   1. Type 2 diabetes mellitus with stage 3b chronic kidney disease, with long-term current use of insulin (HCC)  E11.22    N18.32    Z79.4     2. History of amputation of right great toe ([place])  Z89.411       Change in Pathology: None  ACTIONS PERFORMED Patient's equipment was checked for structural stability and fit. Returned Apex T6000 shoes for A3200M same size and width. Patient is happier with the tread on the A3200M and will hold his inserts until ready for re-fitting. Device(s) intact and fit is excellent. All questions answered and concerns addressed.  PLAN Follow-up as needed (PRN). Plan of care discussed with and agreed upon by patient / caregiver.

## 2021-08-19 NOTE — Progress Notes (Signed)
Subjective:  88 y.o. male with PMHx of diabetes mellitus presenting to the office today with his spouse for follow-up evaluation of an ulcer to the distal tip of the right second toe.  He does have a history of amputation of the right great toe.  Also the patient's diabetic shoes were dispensed today however there is no grip on the certain model of shoes and they are concerned that he would slip and fall in the shoes.  He presents for further treatment and evaluation.  Past Medical History:  Diagnosis Date   Aortic stenosis    BPH (benign prostatic hypertrophy)    Branch retinal vein occlusion 08/01/2013   CAD (coronary artery disease)    CVA (cerebral infarction)    Diabetes mellitus (HCC)    Diabetic retinopathy (HCC)    Gait disturbance    GERD (gastroesophageal reflux disease)    Gout    Heart murmur    HTN (hypertension)    Macular degeneration    Macular edema    Paresthesia    Pseudophakia of right eye    Ptosis    Tremor    Past Surgical History:  Procedure Laterality Date   AMPUTATION Right 03/07/2021   Procedure: AMPUTATION GREAT TOE;  Surgeon: Felecia Shelling, DPM;  Location: [facility];  Service: Podiatry;  Laterality: Right;   AORTIC VALVE REPLACEMENT  03/26/2013   CATARACT EXTRACTION W/ INTRAOCULAR LENS IMPLANT  11/01/2013   CATARACT EXTRACTION W/ INTRAOCULAR LENS IMPLANT Right 12/18/2009   COLONOSCOPY W/ POLYPECTOMY     CORONARY ARTERY BYPASS GRAFT  03/26/2013   EYE SURGERY  03/19/2016   Ocular tube shunt insertion   PARS PLANA VITRECTOMY  03/19/2016   PARS PLANA VITRECTOMY  11/08/2013   PARS PLANA VITRECTOMY  08/02/2012   PROSTATE SURGERY     TEE WITHOUT CARDIOVERSION N/A 05/02/2017   Procedure: TRANSESOPHAGEAL ECHOCARDIOGRAM (TEE);  Surgeon: Lars Masson, MD;  Location: [facility] ENDOSCOPY;  Service: Cardiovascular;  Laterality: N/A;   Allergies  Allergen Reactions   Gabapentin Other (See Comments)   Glyxambi [Empagliflozin-Linagliptin] Other (See Comments)     Lethargy, slurring speaking.    Heparin     Other reaction(s): Heparin-Induced Thrombocytopenia      Objective/Physical Exam General: The patient is alert and oriented x3 in no acute distress.  Dermatology:  Wound #1 noted to the distal aspect of the second toe overlying the DIPJ which measures approximately 0.2 x 0.3 x 0.1 cm.  Wound appears stable with good improvement.  There is some evidence of closure of the wound.  Granular wound base.  Currently there is no exposed bone Wound #2 is a new wound noted to the dorsal aspect of the second toe.  This wound appears somewhat superficial measuring approximately 0.3 x 0.4 x 0.1 cm.  Granular wound base.  Minimal serosanguineous drainage.  No malodor.  Both wounds appear very stable with good healing potential  Vascular: Palpable pedal pulses bilaterally.  Edema noted to the second toe capillary refill within normal limits.  Neurological: Epicritic and protective threshold diminished bilaterally.   Musculoskeletal Exam: History of right great toe amputation.  Healed uneventfully  Assessment: 1.  Ulcer right second toe secondary to diabetes mellitus x2 2. diabetes mellitus w/ peripheral neuropathy 3. H/o RT great toe amputation   Plan of Care:  1. Patient was evaluated.  Medically necessary excisional debridement including subcutaneous tissue was performed using a tissue nipper.  Excisional debridement of all the necrotic nonviable tissue down to  healthier bleeding viable tissue was performed with postdebridement measurement same as pre- 2.  The wound continues to demonstrate potential for healing.  Continue daily wound care and applying the gentamicin cream with a light dressing.  The spouse is doing this 3.  Continue postsurgical shoe 4.  Return to clinic 3 weeks  Felecia Shelling, DPM [facility]  Dr. Felecia Shelling, DPM    [address] [place], [place] [address]                 Office [phone]  Fax [phone]

## 2021-08-24 DIAGNOSIS — E038 Other specified hypothyroidism: Secondary | ICD-10-CM | POA: Diagnosis not present

## 2021-08-24 DIAGNOSIS — I10 Essential (primary) hypertension: Secondary | ICD-10-CM | POA: Diagnosis not present

## 2021-08-24 DIAGNOSIS — E1122 Type 2 diabetes mellitus with diabetic chronic kidney disease: Secondary | ICD-10-CM | POA: Diagnosis not present

## 2021-08-24 DIAGNOSIS — E782 Mixed hyperlipidemia: Secondary | ICD-10-CM | POA: Diagnosis not present

## 2021-08-25 DIAGNOSIS — E782 Mixed hyperlipidemia: Secondary | ICD-10-CM | POA: Diagnosis not present

## 2021-08-25 DIAGNOSIS — E1122 Type 2 diabetes mellitus with diabetic chronic kidney disease: Secondary | ICD-10-CM | POA: Diagnosis not present

## 2021-08-25 DIAGNOSIS — Z87891 Personal history of nicotine dependence: Secondary | ICD-10-CM | POA: Diagnosis not present

## 2021-08-25 DIAGNOSIS — E038 Other specified hypothyroidism: Secondary | ICD-10-CM | POA: Diagnosis not present

## 2021-08-25 DIAGNOSIS — I10 Essential (primary) hypertension: Secondary | ICD-10-CM | POA: Diagnosis not present

## 2021-08-25 DIAGNOSIS — H6123 Impacted cerumen, bilateral: Secondary | ICD-10-CM | POA: Diagnosis not present

## 2021-08-27 DIAGNOSIS — E039 Hypothyroidism, unspecified: Secondary | ICD-10-CM | POA: Diagnosis not present

## 2021-08-27 DIAGNOSIS — R600 Localized edema: Secondary | ICD-10-CM | POA: Diagnosis not present

## 2021-08-27 DIAGNOSIS — S98111A Complete traumatic amputation of right great toe, initial encounter: Secondary | ICD-10-CM | POA: Diagnosis not present

## 2021-08-27 DIAGNOSIS — K219 Gastro-esophageal reflux disease without esophagitis: Secondary | ICD-10-CM | POA: Diagnosis not present

## 2021-08-27 DIAGNOSIS — E11319 Type 2 diabetes mellitus with unspecified diabetic retinopathy without macular edema: Secondary | ICD-10-CM | POA: Diagnosis not present

## 2021-08-27 DIAGNOSIS — Z0000 Encounter for general adult medical examination without abnormal findings: Secondary | ICD-10-CM | POA: Diagnosis not present

## 2021-08-27 DIAGNOSIS — M109 Gout, unspecified: Secondary | ICD-10-CM | POA: Diagnosis not present

## 2021-08-27 DIAGNOSIS — I10 Essential (primary) hypertension: Secondary | ICD-10-CM | POA: Diagnosis not present

## 2021-08-27 DIAGNOSIS — I2510 Atherosclerotic heart disease of native coronary artery without angina pectoris: Secondary | ICD-10-CM | POA: Diagnosis not present

## 2021-08-27 DIAGNOSIS — E1122 Type 2 diabetes mellitus with diabetic chronic kidney disease: Secondary | ICD-10-CM | POA: Diagnosis not present

## 2021-08-27 DIAGNOSIS — N1832 Chronic kidney disease, stage 3b: Secondary | ICD-10-CM | POA: Diagnosis not present

## 2021-08-27 DIAGNOSIS — R2689 Other abnormalities of gait and mobility: Secondary | ICD-10-CM | POA: Diagnosis not present

## 2021-09-09 ENCOUNTER — Other Ambulatory Visit: Payer: Self-pay

## 2021-09-09 ENCOUNTER — Ambulatory Visit: Payer: Medicare Other | Admitting: Podiatry

## 2021-09-09 DIAGNOSIS — E0843 Diabetes mellitus due to underlying condition with diabetic autonomic (poly)neuropathy: Secondary | ICD-10-CM | POA: Diagnosis not present

## 2021-09-09 DIAGNOSIS — R600 Localized edema: Secondary | ICD-10-CM | POA: Diagnosis not present

## 2021-09-09 DIAGNOSIS — L97512 Non-pressure chronic ulcer of other part of right foot with fat layer exposed: Secondary | ICD-10-CM | POA: Diagnosis not present

## 2021-09-09 MED ORDER — GENTAMICIN SULFATE 0.1 % EX CREA: 1.0000 "application " | TOPICAL_CREAM | Freq: Two times a day (BID) | CUTANEOUS | 1 refills | Status: AC

## 2021-09-21 NOTE — Progress Notes (Signed)
? ?Subjective:  ?88 y.o. male with PMHx of diabetes mellitus presenting to the office today with his spouse for follow-up evaluation of an ulcer to the distal tip of the right second toe.  Patient states that since they have not been wearing the compression sleeve they have noticed increased swelling to the left lower extremity.  Overall they believe the wounds are improving.  They have been applying the gentamicin cream with a light dressing daily.  No new complaints at this time ? ?Past Medical History:  ?Diagnosis Date  ? Aortic stenosis   ? BPH (benign prostatic hypertrophy)   ? Branch retinal vein occlusion 08/01/2013  ? CAD (coronary artery disease)   ? CVA (cerebral infarction)   ? Diabetes mellitus ([place])   ? Diabetic retinopathy ([place])   ? Gait disturbance   ? GERD (gastroesophageal reflux disease)   ? Gout   ? Heart murmur   ? HTN (hypertension)   ? Macular degeneration   ? Macular edema   ? Paresthesia   ? Pseudophakia of right eye   ? Ptosis   ? Tremor   ? ?Past Surgical History:  ?Procedure Laterality Date  ? AMPUTATION Right 03/07/2021  ? Procedure: AMPUTATION GREAT TOE;  Surgeon: Edrick Kins, DPM;  Location: [facility];  Service: Podiatry;  Laterality: Right;  ? AORTIC VALVE REPLACEMENT  03/26/2013  ? CATARACT EXTRACTION W/ INTRAOCULAR LENS IMPLANT  11/01/2013  ? CATARACT EXTRACTION W/ INTRAOCULAR LENS IMPLANT Right 12/18/2009  ? COLONOSCOPY W/ POLYPECTOMY    ? CORONARY ARTERY BYPASS GRAFT  03/26/2013  ? EYE SURGERY  03/19/2016  ? Ocular tube shunt insertion  ? PARS PLANA VITRECTOMY  03/19/2016  ? PARS PLANA VITRECTOMY  11/08/2013  ? PARS PLANA VITRECTOMY  08/02/2012  ? PROSTATE SURGERY    ? TEE WITHOUT CARDIOVERSION N/A 05/02/2017  ? Procedure: TRANSESOPHAGEAL ECHOCARDIOGRAM (TEE);  Surgeon: Dorothy Spark, MD;  Location: [place];  Service: Cardiovascular;  Laterality: N/A;  ? ?Allergies  ?Allergen Reactions  ? Gabapentin Other (See Comments)  ? Glyxambi [Empagliflozin-Linagliptin] Other (See  Comments)  ?  Lethargy, slurring speaking.   ? Heparin   ?  Other reaction(s): Heparin-Induced Thrombocytopenia  ? ?  ?Objective/Physical Exam ?General: The patient is alert and oriented x3 in no acute distress. ? ?Dermatology:  ?Small stable ulcers noted to the second toe both measuring about 0.2 x 0.1 x 0.1.  Wounds appear stable with good improvement.  There is some evidence of closure of the wound.  Granular wound base.  Currently there is no exposed bone ?Wound #2 is a new wound noted to the dorsal aspect of the second toe.  This wound appears somewhat superficial measuring approximately 0.3 x 0.4 x 0.1 cm.  Granular wound base.  Minimal serosanguineous drainage.  No malodor. ? ?Both wounds appear very stable with good healing potential ? ?Vascular: Palpable pedal pulses bilaterally.  Today there is edema noted to the left lower extremity up to the calf.  Patient's spouse states that this is because they have not been wearing the compression sleeves. ? ?Neurological: Epicritic and protective threshold diminished bilaterally.  ? ?Musculoskeletal Exam: History of right great toe amputation.  Healed uneventfully ? ?Assessment: ?1.  Ulcer right second toe secondary to diabetes mellitus x2 ?2. diabetes mellitus w/ peripheral neuropathy ?3. H/o RT great toe amputation ?4.  Edema left lower extremity ? ? ?Plan of Care:  ?1. Patient was evaluated.  Patient may resume compression hose.  Patient has compression hose  with a zipper at the side of the leg which she is able to wear  ?2.  Medically necessary excisional debridement including subcutaneous tissue was performed using a tissue nipper to the right second toe.  Excisional debridement of all the necrotic nonviable tissue down to healthier bleeding viable tissue was performed with postdebridement measurement same as pre- ?3.  Refill prescription for gentamicin cream applied daily with a light dressing ?4.  Return to clinic in 3 weeks ? ? ?Edrick Kins, DPM ?[facility] ? ?Dr. Edrick Kins, DPM  ?  ?[address]                                        ?[place], [place] [address]                ?Office [phone]  ?Fax [phone] ? ? ? ? ?

## 2021-09-30 ENCOUNTER — Ambulatory Visit: Payer: Medicare Other | Admitting: Podiatry

## 2021-10-06 DIAGNOSIS — I10 Essential (primary) hypertension: Secondary | ICD-10-CM | POA: Diagnosis not present

## 2021-10-06 DIAGNOSIS — E039 Hypothyroidism, unspecified: Secondary | ICD-10-CM | POA: Diagnosis not present

## 2021-10-09 DIAGNOSIS — E1122 Type 2 diabetes mellitus with diabetic chronic kidney disease: Secondary | ICD-10-CM | POA: Diagnosis not present

## 2021-10-09 DIAGNOSIS — M6281 Muscle weakness (generalized): Secondary | ICD-10-CM | POA: Diagnosis not present

## 2021-10-09 DIAGNOSIS — H353233 Exudative age-related macular degeneration, bilateral, with inactive scar: Secondary | ICD-10-CM | POA: Diagnosis not present

## 2021-10-09 DIAGNOSIS — H548 Legal blindness, as defined in USA: Secondary | ICD-10-CM | POA: Diagnosis not present

## 2021-10-09 DIAGNOSIS — R293 Abnormal posture: Secondary | ICD-10-CM | POA: Diagnosis not present

## 2021-10-09 DIAGNOSIS — E782 Mixed hyperlipidemia: Secondary | ICD-10-CM | POA: Diagnosis not present

## 2021-10-09 DIAGNOSIS — E119 Type 2 diabetes mellitus without complications: Secondary | ICD-10-CM | POA: Diagnosis not present

## 2021-10-09 DIAGNOSIS — S98111A Complete traumatic amputation of right great toe, initial encounter: Secondary | ICD-10-CM | POA: Diagnosis not present

## 2021-10-09 DIAGNOSIS — Z8673 Personal history of transient ischemic attack (TIA), and cerebral infarction without residual deficits: Secondary | ICD-10-CM | POA: Diagnosis not present

## 2021-10-09 DIAGNOSIS — E039 Hypothyroidism, unspecified: Secondary | ICD-10-CM | POA: Diagnosis not present

## 2021-10-09 DIAGNOSIS — I10 Essential (primary) hypertension: Secondary | ICD-10-CM | POA: Diagnosis not present

## 2021-10-09 DIAGNOSIS — R2689 Other abnormalities of gait and mobility: Secondary | ICD-10-CM | POA: Diagnosis not present

## 2021-10-14 ENCOUNTER — Ambulatory Visit: Payer: Medicare Other

## 2021-10-14 ENCOUNTER — Ambulatory Visit: Payer: Medicare Other | Admitting: Podiatry

## 2021-10-14 DIAGNOSIS — L97512 Non-pressure chronic ulcer of other part of right foot with fat layer exposed: Secondary | ICD-10-CM | POA: Diagnosis not present

## 2021-10-14 DIAGNOSIS — E0843 Diabetes mellitus due to underlying condition with diabetic autonomic (poly)neuropathy: Secondary | ICD-10-CM | POA: Diagnosis not present

## 2021-10-14 DIAGNOSIS — Z794 Long term (current) use of insulin: Secondary | ICD-10-CM

## 2021-10-14 NOTE — Progress Notes (Signed)
SITUATION ?Reason for Consult: Follow-up with diabetic shoe replacement ?Patient / Caregiver Report: Patient is satisfied with exchanged shoes ? ?OBJECTIVE DATA ?History / Diagnosis:  ?  ICD-10-CM   ?1. Type 2 diabetes mellitus with stage 3b chronic kidney disease, with long-term current use of insulin (HCC)  E11.22   ? N18.32   ? Z79.4   ?  ? ? ?Change in Pathology: None ? ?ACTIONS PERFORMED ?Patient's equipment was checked for structural stability and fit. Fit patient with A3200M moore balance shoes. Patient is satisfied with fit and function of devices. Device(s) intact and fit is excellent. All questions answered and concerns addressed. ? ?PLAN ?Follow-up as needed (PRN). Plan of care discussed with and agreed upon by patient / caregiver. ? ?

## 2021-10-28 NOTE — Progress Notes (Signed)
? ?Subjective:  ?88 y.o. male with PMHx of diabetes mellitus presenting to the office today with his spouse for follow-up evaluation of an ulcer to the distal tip of the right second toe. Patient's spouse believes the wound has healed. She has been dressing the wound daily as instructed. No new complaints at this time ? ?Past Medical History:  ?Diagnosis Date  ? Aortic stenosis   ? BPH (benign prostatic hypertrophy)   ? Branch retinal vein occlusion 08/01/2013  ? CAD (coronary artery disease)   ? CVA (cerebral infarction)   ? Diabetes mellitus (HCC)   ? Diabetic retinopathy (HCC)   ? Gait disturbance   ? GERD (gastroesophageal reflux disease)   ? Gout   ? Heart murmur   ? HTN (hypertension)   ? Macular degeneration   ? Macular edema   ? Paresthesia   ? Pseudophakia of right eye   ? Ptosis   ? Tremor   ? ?Past Surgical History:  ?Procedure Laterality Date  ? AMPUTATION Right 03/07/2021  ? Procedure: AMPUTATION GREAT TOE;  Surgeon: Felecia Shelling, DPM;  Location: [facility];  Service: Podiatry;  Laterality: Right;  ? AORTIC VALVE REPLACEMENT  03/26/2013  ? CATARACT EXTRACTION W/ INTRAOCULAR LENS IMPLANT  11/01/2013  ? CATARACT EXTRACTION W/ INTRAOCULAR LENS IMPLANT Right 12/18/2009  ? COLONOSCOPY W/ POLYPECTOMY    ? CORONARY ARTERY BYPASS GRAFT  03/26/2013  ? EYE SURGERY  03/19/2016  ? Ocular tube shunt insertion  ? PARS PLANA VITRECTOMY  03/19/2016  ? PARS PLANA VITRECTOMY  11/08/2013  ? PARS PLANA VITRECTOMY  08/02/2012  ? PROSTATE SURGERY    ? TEE WITHOUT CARDIOVERSION N/A 05/02/2017  ? Procedure: TRANSESOPHAGEAL ECHOCARDIOGRAM (TEE);  Surgeon: Lars Masson, MD;  Location: [facility] ENDOSCOPY;  Service: Cardiovascular;  Laterality: N/A;  ? ?Allergies  ?Allergen Reactions  ? Gabapentin Other (See Comments)  ? Glyxambi [Empagliflozin-Linagliptin] Other (See Comments)  ?  Lethargy, slurring speaking.   ? Heparin   ?  Other reaction(s): Heparin-Induced Thrombocytopenia  ? ?  ?Objective/Physical Exam ?General: The patient is  alert and oriented x3 in no acute distress. ? ?Dermatology:  ?Wounds noted previously have healed. Complete reepithelialization has occurred. No open wounds noted ? ?Both wounds appear very stable with good healing potential ? ?Vascular: Palpable pedal pulses bilaterally.  Today there is edema noted to the left lower extremity up to the calf.  Patient's spouse states that this is because they have not been wearing the compression sleeves. ? ?Neurological: Epicritic and protective threshold diminished bilaterally.  ? ?Musculoskeletal Exam: History of right great toe amputation.  Healed uneventfully ? ?Assessment: ?1.  Ulcer right second toe secondary to diabetes mellitus x2; healed ?2. diabetes mellitus w/ peripheral neuropathy ?3. H/o RT great toe amputation ?4.  Edema left lower extremity ? ? ?Plan of Care:  ?1. Patient was evaluated.   ?2. Continue compression hose daily ?3. Continue diabetic shoes ?4. RTC 3 months for routine foot care ? ? ?Felecia Shelling, DPM ?[facility] ? ?Dr. Felecia Shelling, DPM  ?  ?[address]                                        ?[place], [place] [address]                ?Office [phone]  ?Fax [phone] ? ? ? ? ?

## 2021-11-26 DIAGNOSIS — E11319 Type 2 diabetes mellitus with unspecified diabetic retinopathy without macular edema: Secondary | ICD-10-CM | POA: Diagnosis not present

## 2021-11-26 DIAGNOSIS — Z794 Long term (current) use of insulin: Secondary | ICD-10-CM | POA: Diagnosis not present

## 2022-01-20 ENCOUNTER — Ambulatory Visit: Payer: Medicare Other | Admitting: Podiatry

## 2022-02-09 ENCOUNTER — Other Ambulatory Visit: Payer: Self-pay | Admitting: *Deleted

## 2022-02-09 NOTE — Patient Outreach (Signed)
  Care Coordination   Initial Visit Note   02/09/2022 Name: JENARO SOUDER MRN: [id number] DOB: [birth year]  JOHANAN SKORUPSKI is a 88 y.o. year old male who sees Merri Brunette, MD for primary care. I  left voicemail message   SDOH assessments and interventions completed:   No   Encounter Outcome:  No Answer  Gean Maidens BSN RN [facility] [phone]

## 2022-02-10 ENCOUNTER — Other Ambulatory Visit: Payer: Self-pay | Admitting: *Deleted

## 2022-02-10 NOTE — Patient Outreach (Signed)
  Care Coordination   Follow Up Visit Note   02/10/2022 Name: Johnny [name] MRN: [id number] DOB: 22-Mar-1933  Johnny [name] is a 88 y.o. year old male who sees Johnny Brunette, MD for primary care. I spoke with  Johnny [name] by phone today  RN explained the services that [facility] offered RN, Pharmacist, and Social Worker. Per wife she feels everything is under control. She is declining the services.    SDOH assessments and interventions completed:  N     Care Coordination Interventions Activated:  N  Care Coordination Interventions:  No, not indicated   Follow up plan: No further intervention required.   Encounter Outcome:  Pt. Refused   Johnny [name] BSN RN [facility] [phone]

## 2022-02-15 DIAGNOSIS — Z794 Long term (current) use of insulin: Secondary | ICD-10-CM | POA: Diagnosis not present

## 2022-02-15 DIAGNOSIS — E11319 Type 2 diabetes mellitus with unspecified diabetic retinopathy without macular edema: Secondary | ICD-10-CM | POA: Diagnosis not present

## 2022-04-14 ENCOUNTER — Ambulatory Visit: Payer: Medicare Other | Admitting: Podiatry

## 2022-04-14 DIAGNOSIS — N1832 Chronic kidney disease, stage 3b: Secondary | ICD-10-CM

## 2022-04-14 DIAGNOSIS — L97512 Non-pressure chronic ulcer of other part of right foot with fat layer exposed: Secondary | ICD-10-CM | POA: Diagnosis not present

## 2022-04-14 DIAGNOSIS — Z794 Long term (current) use of insulin: Secondary | ICD-10-CM

## 2022-04-14 DIAGNOSIS — E1122 Type 2 diabetes mellitus with diabetic chronic kidney disease: Secondary | ICD-10-CM | POA: Diagnosis not present

## 2022-04-14 NOTE — Progress Notes (Signed)
Chief Complaint  Patient presents with   wound care    Patient is here for right foot wound care, patient has a callous on great toe left foot that his wife is concerned about.    Subjective:  88 y.o. male with PMHx of diabetes mellitus presenting to the office today with his spouse for follow-up evaluation of an ulcer to the dorsal aspect of the right second toe.  Patient has not been seen in this office since 10/14/2021 and at that time he continued to have an ulcer to the toe.  Unfortunately the wound has not healed and there is concern for the nonhealing.  He does have a prior history of amputation of the right great toe with good healing.  Performed inpatient.  Past Medical History:  Diagnosis Date   Aortic stenosis    BPH (benign prostatic hypertrophy)    Branch retinal vein occlusion 08/01/2013   CAD (coronary artery disease)    CVA (cerebral infarction)    Diabetes mellitus (HCC)    Diabetic retinopathy (HCC)    Gait disturbance    GERD (gastroesophageal reflux disease)    Gout    Heart murmur    HTN (hypertension)    Macular degeneration    Macular edema    Paresthesia    Pseudophakia of right eye    Ptosis    Tremor    Past Surgical History:  Procedure Laterality Date   AMPUTATION Right 03/07/2021   Procedure: AMPUTATION GREAT TOE;  Surgeon: Edrick Kins, DPM;  Location: [place];  Service: Podiatry;  Laterality: Right;   AORTIC VALVE REPLACEMENT  03/26/2013   CATARACT EXTRACTION W/ INTRAOCULAR LENS IMPLANT  11/01/2013   CATARACT EXTRACTION W/ INTRAOCULAR LENS IMPLANT Right 12/18/2009   COLONOSCOPY W/ POLYPECTOMY     CORONARY ARTERY BYPASS GRAFT  03/26/2013   EYE SURGERY  03/19/2016   Ocular tube shunt insertion   PARS PLANA VITRECTOMY  03/19/2016   PARS PLANA VITRECTOMY  11/08/2013   PARS PLANA VITRECTOMY  08/02/2012   PROSTATE SURGERY     TEE WITHOUT CARDIOVERSION N/A 05/02/2017   Procedure: TRANSESOPHAGEAL ECHOCARDIOGRAM (TEE);  Surgeon: Dorothy Spark,  MD;  Location: [facility] ENDOSCOPY;  Service: Cardiovascular;  Laterality: N/A;   Allergies  Allergen Reactions   Gabapentin Other (See Comments)   Glyxambi [Empagliflozin-Linagliptin] Other (See Comments)    Lethargy, slurring speaking.    Heparin     Other reaction(s): Heparin-Induced Thrombocytopenia     Objective/Physical Exam General: The patient is alert and oriented x3 in no acute distress.  Dermatology:  Today there is increased deterioration of the wound around the PIPJ of the right second digit.  The wound only measures approximately 0.6 x 0.6 x 0.2 cm however unfortunately there is some exposed bone around the PIPJ of the toe.  Clinically there is no indication of infection.  It appears very stable with some intermittent granulation tissue around the wound base.  Vascular: Palpable pedal pulses bilaterally.  Clinically there is no concern for vascular compromise  Neurological: Light touch and protective threshold diminished bilaterally.   Musculoskeletal Exam: History of right great toe amputation.  Healed uneventfully.  Hammertoe contracture noted to the lesser digits of the right foot contributing to the increased pressure at the PIPJ of the second toe causing the ulcer.  Assessment: 1.  Ulcer right second toe secondary to diabetes mellitus 2. diabetes mellitus w/ peripheral neuropathy 3. H/o RT great toe amputation  Plan of Care:  1. Patient  was evaluated.   2.  The wound to the right second toe has been present now for several months despite conservative wound care.  There is constant concern for infection and more proximal amputation.  I do feel it is appropriate at this time to discuss arthroplasty surgery to resect the portion of hypertrophic, slightly exposed bone to alleviate pressure from the overlying wound.  Currently there is no indication of infection and surgery would be recommended at this time for prevention of constant potential of infection as long as there is no  open wound to the overlying PIPJ of the toe 3.  Risk benefits advantages and disadvantages were explained to the patient and spouse.  No guarantees were expressed or implied.  The patient and spouse both agreed that they would like to proceed with arthroplasty of the PIPJ of the second toe right foot. 4.  Authorization for surgery was initiated today.  Surgery will consist of PIPJ arthroplasty right second toe 5.  Surgery will be performed at the hospital.  Return to clinic 1 week postop   Felecia Shelling, DPM [facility]  Dr. Felecia Shelling, DPM    [address] [place], [place] [address]                Office [phone]  Fax [phone]

## 2022-04-19 NOTE — Progress Notes (Signed)
Surgery orders requested via Epic inbox. °

## 2022-04-20 ENCOUNTER — Telehealth: Payer: Self-pay | Admitting: Urology

## 2022-04-20 NOTE — Progress Notes (Addendum)
Anesthesia Review:  PCP: Cardiologist : Chest x-ray : EKG : Echo : Stress test: Cardiac Cath :  Activity level:  Sleep Study/ CPAP : Fasting Blood Sugar :      / Checks Blood Sugar -- times a day:   Blood Thinner/ Instructions /Last Dose: ASA / Instructions/ Last Dose :   81 mg asdpirn  DM- type  Hgba1c-  PT not arrived at 0800am LVMM at 451 number 806am- LVMM at 451 number.  0817 am- LVMM 0833am- lvmm

## 2022-04-20 NOTE — Patient Instructions (Signed)
SURGICAL WAITING ROOM VISITATION Patients having surgery or a procedure may have no more than 2 support people in the waiting area - these visitors may rotate.   Children under the age of 90 must have an adult with them who is not the patient. If the patient needs to stay at the hospital during part of their recovery, the visitor guidelines for inpatient rooms apply. Pre-op nurse will coordinate an appropriate time for 1 support person to accompany patient in pre-op.  This support person may not rotate.    Please refer to the [facility] website for the visitor guidelines for Inpatients (after your surgery is over and you are in a regular room).       Your procedure is scheduled on:  04/22/2022    Report to [facility] Main Entrance    Report to admitting at    1045AM   Call this number if you have problems the morning of surgery [phone]   Do not eat food :After Midnight.   After Midnight you may have the following liquids until ____ 0945__ AM  DAY OF SURGERY  Water Non-Citrus Juices (without pulp, NO RED) Carbonated Beverages Black Coffee (NO MILK/CREAM OR CREAMERS, sugar ok)  Clear Tea (NO MILK/CREAM OR CREAMERS, sugar ok) regular and decaf                             Plain Jell-O (NO RED)                                           Fruit ices (not with fruit pulp, NO RED)                                     Popsicles (NO RED)                                                               Sports drinks like Gatorade (NO RED)                          If you have questions, please contact your surgeon's office.      Oral Hygiene is also important to reduce your risk of infection.                                    Remember - BRUSH YOUR TEETH THE MORNING OF SURGERY WITH YOUR REGULAR TOOTHPASTE   Do NOT smoke after Midnight   Take these medicines the morning of surgery with A SIP OF WATER:  allopurinol, amlodpine, clonidine, hydralazine, synthroid, metoprolol,  zegerid         Take 1/2 dose of Lantus Insulin nite before surgery    DO NOT TAKE ANY ORAL DIABETIC MEDICATIONS DAY OF YOUR SURGERY  Bring CPAP mask and tubing day of surgery.  You may not have any metal on your body including hair pins, jewelry, and body piercing             Do not wear make-up, lotions, powders, perfumes/cologne, or deodorant  Do not wear nail polish including gel and S&S, artificial/acrylic nails, or any other type of covering on natural nails including finger and toenails. If you have artificial nails, gel coating, etc. that needs to be removed by a nail salon please have this removed prior to surgery or surgery may need to be canceled/ delayed if the surgeon/ anesthesia feels like they are unable to be safely monitored.   Do not shave  48 hours prior to surgery.               Men may shave face and neck.   Do not bring valuables to the hospital. [place].   Contacts, dentures or bridgework may not be worn into surgery.   Bring small overnight bag day of surgery.   DO NOT [place]. PHARMACY WILL DISPENSE MEDICATIONS LISTED ON YOUR MEDICATION LIST TO YOU DURING YOUR ADMISSION [place]!    Patients discharged on the day of surgery will not be allowed to drive home.  Someone NEEDS to stay with you for the first 24 hours after anesthesia.   Special Instructions: Bring a copy of your healthcare power of attorney and living will documents the day of surgery if you haven't scanned them before.              Please read over the following fact sheets you were given: IF [place] [phone]   If you received a COVID test during your pre-op visit  it is requested that you wear a mask when out in public, stay away from anyone that may not be feeling well and notify your surgeon if you develop  symptoms. If you test positive for Covid or have been in contact with anyone that has tested positive in the last 10 days please notify you surgeon.     [place] - Preparing for Surgery Before surgery, you can play an important role.  Because skin is not sterile, your skin needs to be as free of germs as possible.  You can reduce the number of germs on your skin by washing with CHG (chlorahexidine gluconate) soap before surgery.  CHG is an antiseptic cleaner which kills germs and bonds with the skin to continue killing germs even after washing. Please DO NOT use if you have an allergy to CHG or antibacterial soaps.  If your skin becomes reddened/irritated stop using the CHG and inform your nurse when you arrive at Short Stay. Do not shave (including legs and underarms) for at least 48 hours prior to the first CHG shower.  You may shave your face/neck. Please follow these instructions carefully:  1.  Shower with CHG Soap the night before surgery and the  morning of Surgery.  2.  If you choose to wash your hair, wash your hair first as usual with your  normal  shampoo.  3.  After you shampoo, rinse your hair and body thoroughly to remove the  shampoo.  4.  Use CHG as you would any other liquid soap.  You can apply chg directly  to the skin and wash                       Gently with a scrungie or clean washcloth.  5.  Apply the CHG Soap to your body ONLY FROM THE NECK DOWN.   Do not use on face/ open                           Wound or open sores. Avoid contact with eyes, ears mouth and genitals (private parts).                       Wash face,  Genitals (private parts) with your normal soap.             6.  Wash thoroughly, paying special attention to the area where your surgery  will be performed.  7.  Thoroughly rinse your body with warm water from the neck down.  8.  DO NOT shower/wash with your normal soap after using and rinsing off  the CHG Soap.                9.  Pat  yourself dry with a clean towel.            10.  Wear clean pajamas.            11.  Place clean sheets on your bed the night of your first shower and do not  sleep with pets. Day of Surgery : Do not apply any lotions/deodorants the morning of surgery.  Please wear clean clothes to the hospital/surgery center.  FAILURE TO FOLLOW THESE INSTRUCTIONS MAY RESULT IN THE CANCELLATION OF YOUR SURGERY PATIENT SIGNATURE_________________________________  NURSE SIGNATURE__________________________________  ________________________________________________________________________

## 2022-04-20 NOTE — Telephone Encounter (Signed)
DOS - 04/22/22  HAMMERTOE REPAIR 2ND RIGHT --- [id number]    [facility] EFFECTIVE DATE - 03/12/22  PLAN DEDUCTIBLE - $0.00 OUT OF POCKET - $6,700.00 W/ $6,485.00 REMAINING COINSURANCE - 0% COPAY - $395.00  PER [employer] WEBSITE FOR CPT CODE 12878 HAS BEEN APPROVED, AUTH # [id number], GOOD FROM 04/22/22 - 07/21/22.

## 2022-04-21 ENCOUNTER — Inpatient Hospital Stay (HOSPITAL_COMMUNITY)
Admission: RE | Admit: 2022-04-21 | Discharge: 2022-04-21 | Disposition: A | Discharge status: Home / Self Care | Payer: Medicare Other | Source: Ambulatory Visit

## 2022-04-21 DIAGNOSIS — Z01818 Encounter for other preprocedural examination: Secondary | ICD-10-CM

## 2022-04-22 DIAGNOSIS — Z01818 Encounter for other preprocedural examination: Secondary | ICD-10-CM

## 2022-04-28 ENCOUNTER — Encounter (HOSPITAL_COMMUNITY): Payer: Self-pay | Admitting: Physician Assistant

## 2022-04-28 ENCOUNTER — Other Ambulatory Visit: Payer: Self-pay

## 2022-04-28 ENCOUNTER — Encounter (HOSPITAL_COMMUNITY): Payer: Self-pay

## 2022-04-28 ENCOUNTER — Encounter: Payer: Medicare Other | Admitting: Podiatry

## 2022-04-28 ENCOUNTER — Encounter (HOSPITAL_COMMUNITY)
Admission: RE | Admit: 2022-04-28 | Discharge: 2022-04-28 | Disposition: A | Discharge status: Home / Self Care | Payer: Medicare Other | Source: Ambulatory Visit | Attending: Podiatry | Admitting: Podiatry

## 2022-04-28 ENCOUNTER — Encounter (HOSPITAL_COMMUNITY): Payer: Self-pay | Admitting: Anesthesiology

## 2022-04-28 DIAGNOSIS — I10 Essential (primary) hypertension: Secondary | ICD-10-CM | POA: Insufficient documentation

## 2022-04-28 DIAGNOSIS — Z951 Presence of aortocoronary bypass graft: Secondary | ICD-10-CM | POA: Diagnosis not present

## 2022-04-28 DIAGNOSIS — Z01818 Encounter for other preprocedural examination: Secondary | ICD-10-CM | POA: Insufficient documentation

## 2022-04-28 DIAGNOSIS — E11621 Type 2 diabetes mellitus with foot ulcer: Secondary | ICD-10-CM | POA: Diagnosis not present

## 2022-04-28 DIAGNOSIS — L97519 Non-pressure chronic ulcer of other part of right foot with unspecified severity: Secondary | ICD-10-CM | POA: Diagnosis not present

## 2022-04-28 DIAGNOSIS — Z8673 Personal history of transient ischemic attack (TIA), and cerebral infarction without residual deficits: Secondary | ICD-10-CM | POA: Diagnosis not present

## 2022-04-28 DIAGNOSIS — I2510 Atherosclerotic heart disease of native coronary artery without angina pectoris: Secondary | ICD-10-CM | POA: Diagnosis not present

## 2022-04-28 DIAGNOSIS — Z87891 Personal history of nicotine dependence: Secondary | ICD-10-CM | POA: Diagnosis not present

## 2022-04-28 DIAGNOSIS — Z794 Long term (current) use of insulin: Secondary | ICD-10-CM | POA: Diagnosis not present

## 2022-04-28 HISTORY — DX: Depression, unspecified: F32.A

## 2022-04-28 HISTORY — DX: Cerebral infarction, unspecified: I63.9

## 2022-04-28 LAB — BASIC METABOLIC PANEL
Anion gap: 6 (ref 5–15)
BUN: 25 mg/dL — ABNORMAL HIGH (ref 8–23)
CO2: 26 mmol/L (ref 22–32)
Calcium: 9.9 mg/dL (ref 8.9–10.3)
Chloride: 106 mmol/L (ref 98–111)
Creatinine, Ser: 1.9 mg/dL — ABNORMAL HIGH (ref 0.61–1.24)
GFR, Estimated: 34 mL/min — ABNORMAL LOW (ref >60)
Glucose, Bld: 84 mg/dL (ref 70–99)
Potassium: 4.3 mmol/L (ref 3.5–5.1)
Sodium: 138 mmol/L (ref 135–145)

## 2022-04-28 LAB — CBC
HCT: 45.4 % (ref 39.0–52.0)
Hemoglobin: 15 g/dL (ref 13.0–17.0)
MCH: 29.6 pg (ref 26.0–34.0)
MCHC: 33 g/dL (ref 30.0–36.0)
MCV: 89.5 fL (ref 80.0–100.0)
Platelets: 213 10*3/uL (ref 150–400)
RBC: 5.07 MIL/uL (ref 4.22–5.81)
RDW: 13.9 % (ref 11.5–15.5)
WBC: 9.7 10*3/uL (ref 4.0–10.5)
nRBC: 0 % (ref 0.0–0.2)

## 2022-04-28 LAB — GLUCOSE, CAPILLARY: Glucose-Capillary: 102 mg/dL — ABNORMAL HIGH (ref 70–99)

## 2022-04-28 LAB — HEMOGLOBIN A1C
Hgb A1c MFr Bld: 4.7 % — ABNORMAL LOW (ref 4.8–5.6)
Mean Plasma Glucose: 88.19 mg/dL

## 2022-04-28 NOTE — Progress Notes (Signed)
For Short Stay: [place] appointment date: Date of COVID positive in last 54 days:  Bowel Prep reminder:   For Anesthesia: PCP - Dr. Deland Pretty Cardiologist -   Chest x-ray -  EKG -  Stress Test -  ECHO - 05/02/17 Cardiac Cath -  Pacemaker/ICD device last checked: Pacemaker orders received: Device Rep notified:  Spinal Cord Stimulator:  Sleep Study -  CPAP -   Fasting Blood Sugar - 100's Checks Blood Sugar: continuous CBG monitor Date and result of last Hgb A1c-  Blood Thinner Instructions: Aspirin Instructions: On hold Last Dose:  Activity level: Can go up a flight of stairs and activities of daily living without stopping and without chest pain and/or shortness of breath   Able to exercise without chest pain and/or shortness of breath   Unable to go up a flight of stairs without chest pain and/or shortness of breath     Anesthesia review:Hx: CAD,CVA,HTN,DIA   Patient denies shortness of breath, fever, cough and chest pain at PAT appointment   Patient verbalized understanding of instructions that were given to them at the PAT appointment. Patient was also instructed that they will need to review over the PAT instructions again at home before surgery.

## 2022-04-28 NOTE — Progress Notes (Signed)
Anesthesia Chart Review   Case: [id number] Date/Time: 04/29/22 1545   Procedure: HAMMER TOE CORRECTION SECOND TOE (Right: Toe)   Anesthesia type: Choice   Pre-op diagnosis: ULCER SECOND RIGHT TOE   [facility] ROOM 03 / [facility]   Surgeons: Felecia Shelling, DPM       DISCUSSION:88 y.o. former smoker with h/o HTN, DM II, CVA, CAD, AS, ulcer to second right toe scheduled for above procedure 04/29/2022 with Adolphus Birchwood, DPM.   S/p CABG and AVR 2014.  The most recent Echo available is dated 05/02/2017 which shows moderate AS, mean gradient 28 mmHg, peak gradient 48 mmHg.    From Epic it appears patient was last seen by cardio in 2020.    Pt will need cardiac clearance and an updated Echo. Discussed with [facility] sched[facility].  VS: BP (!) 141/56   Pulse (!) 54   Temp 36.7 C (Oral)   Ht 5\' 7"  (1.702 m)   Wt 78 kg   SpO2 96%   BMI 26.94 kg/m   PROVIDERS: Merri Brunette, MD is PCP    LABS: Labs reviewed: Acceptable for surgery. (all labs ordered are listed, but only abnormal results are displayed)  Labs Reviewed  GLUCOSE, CAPILLARY - Abnormal; Notable for the following components:      Result Value   Glucose-Capillary 102 (*)    All other components within normal limits  CBC  HEMOGLOBIN A1C  BASIC METABOLIC PANEL     IMAGES:   EKG:   CV:  Past Medical History:  Diagnosis Date   Aortic stenosis    BPH (benign prostatic hypertrophy)    Branch retinal vein occlusion 08/01/2013   CAD (coronary artery disease)    CVA (cerebral infarction)    Depression    Diabetes mellitus (HCC)    Diabetic retinopathy (HCC)    Gait disturbance    GERD (gastroesophageal reflux disease)    Gout    Heart murmur    HTN (hypertension)    Macular degeneration    Macular edema    Paresthesia    Pseudophakia of right eye    Ptosis    Stroke [facility])    Tremor     Past Surgical History:  Procedure Laterality Date   AMPUTATION Right 03/07/2021   Procedure:  AMPUTATION GREAT TOE;  Surgeon: Felecia Shelling, DPM;  [facility];  Service: Podiatry;  Laterality: Right;   AORTIC VALVE REPLACEMENT  03/26/2013   CATARACT EXTRACTION W/ INTRAOCULAR LENS IMPLANT  11/01/2013   CATARACT EXTRACTION W/ INTRAOCULAR LENS IMPLANT Right 12/18/2009   COLONOSCOPY W/ POLYPECTOMY     CORONARY ARTERY BYPASS GRAFT  03/26/2013   EYE SURGERY  03/19/2016   Ocular tube shunt insertion   PARS PLANA VITRECTOMY  03/19/2016   PARS PLANA VITRECTOMY  11/08/2013   PARS PLANA VITRECTOMY  08/02/2012   PROSTATE SURGERY     TEE WITHOUT CARDIOVERSION N/A 05/02/2017   Procedure: TRANSESOPHAGEAL ECHOCARDIOGRAM (TEE);  Surgeon: Lars Masson, MD;  Location: [facility] ENDOSCOPY;  Service: Cardiovascular;  Laterality: N/A;    MEDICATIONS:  acetaminophen (TYLENOL) 325 MG tablet   allopurinol (ZYLOPRIM) 100 MG tablet   amLODipine (NORVASC) 10 MG tablet   aspirin 81 MG chewable tablet   Cholecalciferol (VITAMIN D-3) 125 MCG (5000 UT) TABS   cloNIDine (CATAPRES) 0.1 MG tablet   docusate sodium (COLACE) 100 MG capsule   furosemide (LASIX) 40 MG tablet   gentamicin cream (GARAMYCIN) 0.1 %   hydrALAZINE (APRESOLINE)  100 MG tablet   insulin glargine (LANTUS) 100 UNIT/ML injection   insulin lispro (HUMALOG) 100 UNIT/ML injection   ketoconazole (NIZORAL) 2 % cream   levothyroxine (SYNTHROID) 25 MCG tablet   metoprolol tartrate (LOPRESSOR) 50 MG tablet   Omeprazole-Sodium Bicarbonate (ZEGERID) 20-1100 MG CAPS capsule   OVER THE COUNTER MEDICATION   rosuvastatin (CRESTOR) 10 MG tablet   senna (SENOKOT) 8.6 MG TABS tablet   zinc gluconate 50 MG tablet   No current facility-administered medications for this encounter.    Konrad Felix Ward, PA-C [facility] Pre-Surgical Testing [phone]

## 2022-04-29 ENCOUNTER — Encounter (HOSPITAL_COMMUNITY): Admission: RE | Payer: Self-pay | Source: Home / Self Care

## 2022-04-29 ENCOUNTER — Telehealth: Payer: Self-pay | Admitting: Podiatry

## 2022-04-29 ENCOUNTER — Ambulatory Visit (HOSPITAL_COMMUNITY): Admission: RE | Admit: 2022-04-29 | Payer: Medicare Other | Source: Home / Self Care | Admitting: Podiatry

## 2022-04-29 DIAGNOSIS — Z01818 Encounter for other preprocedural examination: Secondary | ICD-10-CM

## 2022-04-29 SURGERY — CORRECTION, HAMMER TOE
Anesthesia: Choice | Site: Toe | Laterality: Right

## 2022-04-29 NOTE — Telephone Encounter (Signed)
DOS: 05/19/2022  [employer] Medicare  Procedure: Hammertoe Repair 2nd Rt (09983) DX: M20.41  Deductible: $0 Out-of-Pocket: $6,700 with $6,440 remaining CoInsurance: 0% Copay: $45  Notification or Prior Authorization is not required for the requested services  This [employer] Advantage members plan does not currently require a prior authorization for these services. If you have general questions about the prior authorization requirements, please call us at [phone] or visit [email or web address] > Clinician Resources > Advance and Admission Notification Requirements. The number above acknowledges your notification. Please write this number down for future reference. Notification is not a guarantee of coverage or payment.  Decision ID #:[id number]  The number above acknowledges your inquiry and our response. Please write this number down and refer to it for future inquiries. Coverage and payment for an item or service is governed by the member's benefit plan document, and, if applicable, the provider's participation agreement with the Health Plan.

## 2022-05-05 ENCOUNTER — Encounter: Payer: Medicare Other | Admitting: Podiatry

## 2022-05-12 ENCOUNTER — Encounter: Payer: Medicare Other | Admitting: Podiatry

## 2022-05-13 ENCOUNTER — Inpatient Hospital Stay (HOSPITAL_COMMUNITY)
Admission: EM | Admit: 2022-05-13 | Discharge: 2022-05-19 | DRG: 640 | Disposition: A | Discharge status: Skilled Nursing Facility | Payer: Medicare Other | Attending: Internal Medicine | Admitting: Internal Medicine

## 2022-05-13 ENCOUNTER — Emergency Department (HOSPITAL_COMMUNITY): Payer: Medicare Other

## 2022-05-13 ENCOUNTER — Other Ambulatory Visit: Payer: Self-pay

## 2022-05-13 ENCOUNTER — Encounter (HOSPITAL_COMMUNITY): Payer: Self-pay | Admitting: Emergency Medicine

## 2022-05-13 DIAGNOSIS — E11621 Type 2 diabetes mellitus with foot ulcer: Secondary | ICD-10-CM | POA: Diagnosis present

## 2022-05-13 DIAGNOSIS — D72829 Elevated white blood cell count, unspecified: Secondary | ICD-10-CM | POA: Diagnosis present

## 2022-05-13 DIAGNOSIS — E11622 Type 2 diabetes mellitus with other skin ulcer: Secondary | ICD-10-CM | POA: Diagnosis present

## 2022-05-13 DIAGNOSIS — R471 Dysarthria and anarthria: Secondary | ICD-10-CM | POA: Diagnosis present

## 2022-05-13 DIAGNOSIS — R2990 Unspecified symptoms and signs involving the nervous system: Secondary | ICD-10-CM

## 2022-05-13 DIAGNOSIS — Z89411 Acquired absence of right great toe: Secondary | ICD-10-CM

## 2022-05-13 DIAGNOSIS — R1310 Dysphagia, unspecified: Secondary | ICD-10-CM

## 2022-05-13 DIAGNOSIS — Z7982 Long term (current) use of aspirin: Secondary | ICD-10-CM

## 2022-05-13 DIAGNOSIS — I2510 Atherosclerotic heart disease of native coronary artery without angina pectoris: Secondary | ICD-10-CM | POA: Diagnosis present

## 2022-05-13 DIAGNOSIS — Z951 Presence of aortocoronary bypass graft: Secondary | ICD-10-CM

## 2022-05-13 DIAGNOSIS — Z8673 Personal history of transient ischemic attack (TIA), and cerebral infarction without residual deficits: Secondary | ICD-10-CM

## 2022-05-13 DIAGNOSIS — H3530 Unspecified macular degeneration: Secondary | ICD-10-CM | POA: Diagnosis present

## 2022-05-13 DIAGNOSIS — Z87891 Personal history of nicotine dependence: Secondary | ICD-10-CM

## 2022-05-13 DIAGNOSIS — H1131 Conjunctival hemorrhage, right eye: Secondary | ICD-10-CM | POA: Diagnosis present

## 2022-05-13 DIAGNOSIS — Z6828 Body mass index (BMI) 28.0-28.9, adult: Secondary | ICD-10-CM

## 2022-05-13 DIAGNOSIS — E876 Hypokalemia: Secondary | ICD-10-CM | POA: Diagnosis present

## 2022-05-13 DIAGNOSIS — Z953 Presence of xenogenic heart valve: Secondary | ICD-10-CM

## 2022-05-13 DIAGNOSIS — I130 Hypertensive heart and chronic kidney disease with heart failure and stage 1 through stage 4 chronic kidney disease, or unspecified chronic kidney disease: Secondary | ICD-10-CM | POA: Diagnosis present

## 2022-05-13 DIAGNOSIS — Z888 Allergy status to other drugs, medicaments and biological substances status: Secondary | ICD-10-CM

## 2022-05-13 DIAGNOSIS — I10 Essential (primary) hypertension: Secondary | ICD-10-CM | POA: Diagnosis present

## 2022-05-13 DIAGNOSIS — F32A Depression, unspecified: Secondary | ICD-10-CM | POA: Diagnosis present

## 2022-05-13 DIAGNOSIS — E440 Moderate protein-calorie malnutrition: Secondary | ICD-10-CM | POA: Insufficient documentation

## 2022-05-13 DIAGNOSIS — E871 Hypo-osmolality and hyponatremia: Secondary | ICD-10-CM | POA: Diagnosis not present

## 2022-05-13 DIAGNOSIS — Z79890 Hormone replacement therapy (postmenopausal): Secondary | ICD-10-CM

## 2022-05-13 DIAGNOSIS — E1122 Type 2 diabetes mellitus with diabetic chronic kidney disease: Secondary | ICD-10-CM | POA: Diagnosis present

## 2022-05-13 DIAGNOSIS — N1832 Chronic kidney disease, stage 3b: Secondary | ICD-10-CM

## 2022-05-13 DIAGNOSIS — E538 Deficiency of other specified B group vitamins: Secondary | ICD-10-CM | POA: Diagnosis present

## 2022-05-13 DIAGNOSIS — I160 Hypertensive urgency: Secondary | ICD-10-CM | POA: Diagnosis present

## 2022-05-13 DIAGNOSIS — Z794 Long term (current) use of insulin: Secondary | ICD-10-CM

## 2022-05-13 DIAGNOSIS — Z79899 Other long term (current) drug therapy: Secondary | ICD-10-CM

## 2022-05-13 DIAGNOSIS — E785 Hyperlipidemia, unspecified: Secondary | ICD-10-CM | POA: Diagnosis present

## 2022-05-13 DIAGNOSIS — R600 Localized edema: Secondary | ICD-10-CM

## 2022-05-13 DIAGNOSIS — I5033 Acute on chronic diastolic (congestive) heart failure: Secondary | ICD-10-CM | POA: Diagnosis not present

## 2022-05-13 DIAGNOSIS — L97519 Non-pressure chronic ulcer of other part of right foot with unspecified severity: Secondary | ICD-10-CM | POA: Diagnosis present

## 2022-05-13 DIAGNOSIS — E039 Hypothyroidism, unspecified: Secondary | ICD-10-CM | POA: Diagnosis present

## 2022-05-13 DIAGNOSIS — M109 Gout, unspecified: Secondary | ICD-10-CM | POA: Diagnosis present

## 2022-05-13 DIAGNOSIS — R4781 Slurred speech: Principal | ICD-10-CM | POA: Diagnosis present

## 2022-05-13 DIAGNOSIS — K219 Gastro-esophageal reflux disease without esophagitis: Secondary | ICD-10-CM | POA: Diagnosis present

## 2022-05-13 DIAGNOSIS — N183 Chronic kidney disease, stage 3 unspecified: Secondary | ICD-10-CM | POA: Diagnosis present

## 2022-05-13 DIAGNOSIS — N400 Enlarged prostate without lower urinary tract symptoms: Secondary | ICD-10-CM | POA: Diagnosis present

## 2022-05-13 DIAGNOSIS — Z881 Allergy status to other antibiotic agents status: Secondary | ICD-10-CM

## 2022-05-13 DIAGNOSIS — Z952 Presence of prosthetic heart valve: Secondary | ICD-10-CM

## 2022-05-13 DIAGNOSIS — G459 Transient cerebral ischemic attack, unspecified: Secondary | ICD-10-CM | POA: Diagnosis present

## 2022-05-13 DIAGNOSIS — I69391 Dysphagia following cerebral infarction: Secondary | ICD-10-CM

## 2022-05-13 DIAGNOSIS — G8191 Hemiplegia, unspecified affecting right dominant side: Secondary | ICD-10-CM | POA: Diagnosis present

## 2022-05-13 DIAGNOSIS — Z961 Presence of intraocular lens: Secondary | ICD-10-CM | POA: Diagnosis present

## 2022-05-13 DIAGNOSIS — E861 Hypovolemia: Secondary | ICD-10-CM | POA: Diagnosis present

## 2022-05-13 DIAGNOSIS — I672 Cerebral atherosclerosis: Secondary | ICD-10-CM | POA: Diagnosis present

## 2022-05-13 DIAGNOSIS — H548 Legal blindness, as defined in USA: Secondary | ICD-10-CM | POA: Diagnosis present

## 2022-05-13 LAB — CBC
HCT: 45.7 % (ref 39.0–52.0)
Hemoglobin: 16 g/dL (ref 13.0–17.0)
MCH: 29.9 pg (ref 26.0–34.0)
MCHC: 35 g/dL (ref 30.0–36.0)
MCV: 85.4 fL (ref 80.0–100.0)
Platelets: 288 10*3/uL (ref 150–400)
RBC: 5.35 MIL/uL (ref 4.22–5.81)
RDW: 13.2 % (ref 11.5–15.5)
WBC: 13.5 10*3/uL — ABNORMAL HIGH (ref 4.0–10.5)
nRBC: 0 % (ref 0.0–0.2)

## 2022-05-13 LAB — I-STAT CHEM 8, ED
BUN: 19 mg/dL (ref 8–23)
Calcium, Ion: 1.2 mmol/L (ref 1.15–1.40)
Chloride: 92 mmol/L — ABNORMAL LOW (ref 98–111)
Creatinine, Ser: 1.4 mg/dL — ABNORMAL HIGH (ref 0.61–1.24)
Glucose, Bld: 148 mg/dL — ABNORMAL HIGH (ref 70–99)
HCT: 49 % (ref 39.0–52.0)
Hemoglobin: 16.7 g/dL (ref 13.0–17.0)
Potassium: 4.1 mmol/L (ref 3.5–5.1)
Sodium: 125 mmol/L — ABNORMAL LOW (ref 135–145)
TCO2: 22 mmol/L (ref 22–32)

## 2022-05-13 LAB — COMPREHENSIVE METABOLIC PANEL
ALT: 25 U/L (ref 0–44)
AST: 29 U/L (ref 15–41)
Albumin: 3.6 g/dL (ref 3.5–5.0)
Alkaline Phosphatase: 66 U/L (ref 38–126)
Anion gap: 11 (ref 5–15)
BUN: 19 mg/dL (ref 8–23)
CO2: 21 mmol/L — ABNORMAL LOW (ref 22–32)
Calcium: 10.2 mg/dL (ref 8.9–10.3)
Chloride: 95 mmol/L — ABNORMAL LOW (ref 98–111)
Creatinine, Ser: 1.59 mg/dL — ABNORMAL HIGH (ref 0.61–1.24)
GFR, Estimated: 42 mL/min — ABNORMAL LOW (ref >60)
Glucose, Bld: 153 mg/dL — ABNORMAL HIGH (ref 70–99)
Potassium: 4.1 mmol/L (ref 3.5–5.1)
Sodium: 127 mmol/L — ABNORMAL LOW (ref 135–145)
Total Bilirubin: 1 mg/dL (ref 0.3–1.2)
Total Protein: 6.9 g/dL (ref 6.5–8.1)

## 2022-05-13 LAB — PROTIME-INR
INR: 1.1 (ref 0.8–1.2)
Prothrombin Time: 13.9 seconds (ref 11.4–15.2)

## 2022-05-13 LAB — DIFFERENTIAL
Abs Immature Granulocytes: 0.04 10*3/uL (ref 0.00–0.07)
Basophils Absolute: 0.1 10*3/uL (ref 0.0–0.1)
Basophils Relative: 0 %
Eosinophils Absolute: 0.1 10*3/uL (ref 0.0–0.5)
Eosinophils Relative: 1 %
Immature Granulocytes: 0 %
Lymphocytes Relative: 13 %
Lymphs Abs: 1.7 10*3/uL (ref 0.7–4.0)
Monocytes Absolute: 1.2 10*3/uL — ABNORMAL HIGH (ref 0.1–1.0)
Monocytes Relative: 9 %
Neutro Abs: 10.4 10*3/uL — ABNORMAL HIGH (ref 1.7–7.7)
Neutrophils Relative %: 77 %

## 2022-05-13 LAB — APTT: aPTT: 30 seconds (ref 24–36)

## 2022-05-13 LAB — CBG MONITORING, ED: Glucose-Capillary: 168 mg/dL — ABNORMAL HIGH (ref 70–99)

## 2022-05-13 LAB — ETHANOL: Alcohol, Ethyl (B): <10 mg/dL (ref <10)

## 2022-05-13 MED ORDER — SODIUM CHLORIDE 0.9 % IV BOLUS
500.0000 mL | Freq: Once | INTRAVENOUS | Status: AC
  Administered 2022-05-13: 500 mL via INTRAVENOUS

## 2022-05-13 NOTE — ED Provider Notes (Signed)
[facility] EMERGENCY DEPARTMENT Provider Note   CSN: [id number] Arrival date & time: 05/13/22  2151  An emergency department physician performed an initial assessment on this suspected stroke patient at 2156.  History  Chief Complaint  Patient presents with   Code Stroke   Johnny [name] is an 88 year old male, former smoker with history of HTN, DM II, CVA, CAD s/p CABG 2014, AS s/p AVR 2014, prior history of cryptogenic strokes who presents as Code Stroke. Per EMS, the patient's wife noticed that he had slurred speech starting at 19:30 this evening.  He was also hypertensive to systolics in 200s per EMS. The patient reports that he has not been eating or drinking well for the past 3 days because it has been difficult to swallow. He reports some right sided weakness.   The history is provided by the EMS personnel.       Home Medications Prior to Admission medications   Medication Sig Start Date End Date Taking? Authorizing Provider  acetaminophen (TYLENOL) 325 MG tablet Take 2 tablets (650 mg total) by mouth every 6 (six) hours as needed for mild pain (or Fever >/= 101). 03/11/21   Narda Bonds, MD  allopurinol (ZYLOPRIM) 100 MG tablet Take 2 tablets (200 mg total) by mouth at bedtime. Patient taking differently: Take 200 mg by mouth daily at 6 PM. 04/03/21   Medina-Vargas, Monina C, NP  amLODipine (NORVASC) 10 MG tablet Take 1 tablet (10 mg total) by mouth daily. 04/03/21   Medina-Vargas, Monina C, NP  aspirin 81 MG chewable tablet Chew 1 tablet (81 mg total) by mouth daily. 04/03/21   Medina-Vargas, Monina C, NP  Cholecalciferol (VITAMIN D-3) 125 MCG (5000 UT) TABS Take 5,000 Units by mouth daily. K3    [provider]  cloNIDine (CATAPRES) 0.1 MG tablet Take 0.1 mg by mouth 2 (two) times daily. 01/21/22   [provider]  docusate sodium (COLACE) 100 MG capsule Take 1 capsule (100 mg total) by mouth daily. Patient taking differently: Take 100 mg  by mouth 2 (two) times daily. 04/03/21   Medina-Vargas, Monina C, NP  furosemide (LASIX) 40 MG tablet Take 1 tablet (40 mg total) by mouth daily. 04/03/21   Medina-Vargas, Monina C, NP  gentamicin cream (GARAMYCIN) 0.1 % Apply 1 application topically 2 (two) times daily. Patient taking differently: Apply 1 application  topically 2 (two) times daily. On toe 09/09/21   Felecia Shelling, DPM  hydrALAZINE (APRESOLINE) 100 MG tablet Take 1 tablet (100 mg total) by mouth 3 (three) times daily. 04/03/21   Medina-Vargas, Monina C, NP  insulin glargine (LANTUS) 100 UNIT/ML injection Inject 0.35 mLs (35 Units total) into the skin at bedtime. Patient taking differently: Inject 10-12 Units into the skin at bedtime. Sliding scale 04/03/21   Medina-Vargas, Monina C, NP  insulin lispro (HUMALOG) 100 UNIT/ML injection Inject 0.1-0.16 mLs (10-16 Units total) into the skin 3 (three) times daily before meals. Below 200 units = 10units 200-250=12 units 250-300=14units Over 300=16units 04/03/21   Medina-Vargas, Monina C, NP  ketoconazole (NIZORAL) 2 % cream Apply 1 application topically daily. 05/05/21   Edwin Cap, DPM  levothyroxine (SYNTHROID) 25 MCG tablet Take 25 mcg by mouth every morning. 04/01/22   [provider]  metoprolol tartrate (LOPRESSOR) 50 MG tablet Take 1 tablet (50 mg total) by mouth 2 (two) times daily. 04/03/21   Medina-Vargas, Monina C, NP  Omeprazole-Sodium Bicarbonate (ZEGERID) 20-1100 MG CAPS capsule Take 1  capsule by mouth daily before breakfast. 04/03/21   Medina-Vargas, Monina C, NP  OVER THE COUNTER MEDICATION Apply 1 Application topically daily. Emuaidmax cream on toe    [provider]  rosuvastatin (CRESTOR) 10 MG tablet Take 1 tablet (10 mg total) by mouth daily. Patient taking differently: Take 10 mg by mouth at bedtime. 04/03/21   Medina-Vargas, Monina C, NP  senna (SENOKOT) 8.6 MG TABS tablet Take 1 tablet (8.6 mg total) by mouth daily. Patient taking differently: Take 1  tablet by mouth at bedtime. 04/03/21   Medina-Vargas, Monina C, NP  zinc gluconate 50 MG tablet Take 1 tablet (50 mg total) by mouth daily. 04/03/21   Medina-Vargas, Monina C, NP      Allergies    Gabapentin, Glyxambi [empagliflozin-linagliptin], and Heparin    Review of Systems   Review of Systems  See HPI.   Physical Exam Updated Vital Signs BP (!) 189/80   Pulse (!) 56   Temp 98.2 F (36.8 C) (Oral)   Resp 16   Wt 83 kg   SpO2 94%   BMI 28.66 kg/m   Physical Exam General: No acute distress.  HEENT: Normocephalic atraumatic, right subconjunctival hemorrhage. CV: Normal rate. 2+ radial pulses bilaterally.  Respiratory: Normal respirations, saturating normally on room air. Extremities: Pitting edema in bilateral lower extremities. Extremities warm.  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 Motor examination with no drift in the bilateral upper extremities. 3/5 strength in RLE. LLE 4/5 in strength.  Sensation intact on the face and arm.  Diminished in a stocking type pattern on the legs.  ED Results / Procedures / Treatments   Labs (all labs ordered are listed, but only abnormal results are displayed) Labs Reviewed  CBC - Abnormal; Notable for the following components:      Result Value   WBC 13.5 (*)    All other components within normal limits  DIFFERENTIAL - Abnormal; Notable for the following components:   Neutro Abs 10.4 (*)    Monocytes Absolute 1.2 (*)    All other components within normal limits  COMPREHENSIVE METABOLIC PANEL - Abnormal; Notable for the following components:   Sodium 127 (*)    Chloride 95 (*)    CO2 21 (*)    Glucose, Bld 153 (*)    Creatinine, Ser 1.59 (*)    GFR, Estimated 42 (*)    All other components within normal limits  I-STAT CHEM 8, ED - Abnormal;  Notable for the following components:   Sodium 125 (*)    Chloride 92 (*)    Creatinine, Ser 1.40 (*)    Glucose, Bld 148 (*)    All other components within normal limits  CBG MONITORING, ED - Abnormal; Notable for the following components:   Glucose-Capillary 168 (*)    All other components within normal limits  ETHANOL  PROTIME-INR  APTT  RAPID URINE DRUG SCREEN, HOSP PERFORMED  URINALYSIS, ROUTINE W REFLEX MICROSCOPIC    EKG None  Radiology CT HEAD CODE STROKE WO CONTRAST  Result Date: 05/13/2022 CLINICAL DATA:  Code stroke. Initial evaluation for neuro deficit, stroke suspected. EXAM: CT HEAD WITHOUT CONTRAST TECHNIQUE: Contiguous axial images were obtained from the base of the skull through the vertex without intravenous contrast. RADIATION DOSE REDUCTION: This exam was performed according to the departmental dose-optimization program which includes  automated exposure control, adjustment of the mA and/or kV according to patient size and/or use of iterative reconstruction technique. COMPARISON:  Prior study from 03/10/2021. FINDINGS: Brain: Moderately advanced cerebral atrophy with mild chronic small vessel ischemic disease. Small remote right cerebellar infarct. No acute intracranial hemorrhage. No acute large vessel territory infarct. No mass lesion or midline shift. No hydrocephalus or extra-axial fluid collection. Vascular: No abnormal hyperdense vessel. Calcified atherosclerosis present at the skull base. Skull: Scalp soft tissues and calvarium demonstrate no acute finding. Sinuses/Orbits: Abnormal hyperdensity seen involving the posterior aspect of the right globe, suggestive of possible hemorrhage (series 2, image 9). Postoperative changes noted about the contralateral left globe. Paranasal sinuses are clear. Other: No mastoid effusion. ASPECTS [facility] Stroke Program Early CT Score) - Ganglionic level infarction (caudate, lentiform nuclei, internal capsule, insula, M1-M3 cortex): 7  - Supraganglionic infarction (M4-M6 cortex): 3 Total score (0-10 with 10 being normal): 10 IMPRESSION: 1. No acute intracranial abnormality. 2. ASPECTS is 10. 3. Hyperdensity involving the posterior aspect of the right globe, suggesting vitreous hemorrhage. Correlation with physical exam recommended. 4. Atrophy with chronic microvascular ischemic disease. Small remote right cerebellar infarct. These results were communicated to Dr. Rory Percy at 10:06 pm on 05/13/2022 by text page via the [facility] messaging system. Electronically Signed   By: Jeannine Boga M.D.   On: 05/13/2022 22:08    Procedures Procedures    Medications Ordered in ED Medications  sodium chloride 0.9 % bolus 500 mL (500 mLs Intravenous New Bag/Given 05/13/22 2325)    ED Course/ Medical Decision Making/ A&P Clinical Course as of 05/13/22 2332  Thu May 13, 2022  2207 Fluctuating symptoms, admit for TIA w/u per Neuro (Dr. Rory Percy). Intermittent L sided symptoms and slurred speech, not back to baseline. Too mild to treat and improved per Neuro, NIH 3 but blindness is baseline so more like a 2.  [DG]    Clinical Course User Index [DG] Renard Matter, MD                           Medical Decision Making Problems Addressed: Slurred speech: acute illness or injury that poses a threat to life or bodily functions  Amount and/or Complexity of Data Reviewed Independent Historian: spouse and EMS External Data Reviewed: labs, ECG and notes. Labs: ordered. Decision-making details documented in ED Course. Radiology: ordered and independent interpretation performed. Decision-making details documented in ED Course. ECG/medicine tests: ordered and independent interpretation performed. Decision-making details documented in ED Course. Discussion of management or test interpretation with external provider(s): Neurology   Risk Decision regarding hospitalization.   CT HEAD CODE STROKE WO CONTRAST  Final Result    MR BRAIN WO CONTRAST     (Results Pending)  MR ANGIO HEAD WO CONTRAST    (Results Pending)  VAS US CAROTID    (Results Pending)    Johnny [name] is a 88 y.o. male with PMHX of HTN, DM II, CVA, CAD s/p CABG 2014, AS s/p AVR 2014, prior history of cryptogenic strokes who presents with slurred speech, dysphagia, and intermittent weakness, concerning for a stroke. CODE STROKE was initiated on this patient.   The patient was quickly assessed by myself and the attending. The patient was maintaining his own airway.The stroke team was emergently consulted and present at bedside quickly after arrival. A through neurological exam was performed, and pertinent findings include dysarthria, RLE weakness.   CT scanner was made available and the patient was rapidly  transported. Head CT full report in chart, but in summary revealed:   - CTH:  Moderately advanced cerebral atrophy with mild chronic small  vessel ischemic disease. Small remote right cerebellar infarct. No  acute intracranial hemorrhage. No acute large vessel territory  infarct. No mass lesion or midline shift. No hydrocephalus or  extra-axial fluid collection.   Imaging interpreted by radiology and additionally by me, Neurology team.   Other causes of the neurological findings were considered, and include infectious causes (encephalopathy, meningitis), tumor/abscess, toxic causes (hypoglycemia, renal failure, hepatic failure, toxic ingestion), other neurologic disorders (seizure, Todd paralysis, GBS, status epilepticus), psychiatric causes, migraine, hypertensive encephalopathy, and trauma (ICH, spinal injury), however history, PE findings, and imaging findings not consistent. TIA remains a concern given episodic symptoms.   Labs performed: CBC with leukocytosis of 13.5, however patient without tachycardia, hypotension, or fever or infectious symptoms to suggest that this is caused by infection at this time.  May be secondary to a stress reaction.  Hemoglobin within  normal limits at 16.  CMP with creatinine 1.59, patient's last value was 1.92 weeks ago.  Glucose 153.  Sodium low at 127, down from most recent prior, patient also with chloride low at 95 and bicarbonate of 21, suspect secondary to poor p.o. intake in the setting of dysphagia. INR 1.1.   EKG on my independent review shows sinus rhythm with left bundle branch block, rate 61 bpm.  No STEMI per scar Bosa criteria.  QTc 472.  On review of last EKG on 04/28/2022, no significant change.  Neurology recommended admission to Hospitalist for TIA workup given whole body twitching for more than a year worsened over the last 2 months, worsened slurred speech and dysphagia over the last 2 to 3 days.  Patient admitted to hospitalist in stable condition for TIA work-up.        Final Clinical Impression(s) / ED Diagnoses Final diagnoses:  Slurred speech  Dysphagia, unspecified type    Rx / DC Orders ED Discharge Orders     None         Stephanie Coup, MD 05/13/22 8676    Rolan Bucco, MD 05/14/22 1500

## 2022-05-13 NOTE — Consult Note (Signed)
Neurology Consultation  Reason for Consult: Code stroke-slurred speech Referring Physician: Dr. Rolan Bucco  CC: Slurred speech, decreased level of consciousness  History is obtained from: EMS, chart  HPI: Johnny [name] is a 88 y.o. male past medical history of diabetes, macular degeneration causing blindness, coronary artery disease, aortic stenosis, hypertension, prior history of cryptogenic strokes, presenting for evaluation of sudden onset of slurred speech this afternoon with symptom onset noted around 1930 hrs. this was information provided by EMS. I was able to call the wife and get some more information.  The history that she provides is as below. The patient has been having for the past 1 year these episodes of intermittent twitching and whole body jerking which has been worse over the past 2 months.  His last known well was sometime yesterday-she has been having difficulty swallowing and difficulty with his speech that has been slurred off-and-on now for upwards of 24 hours.  This evening, he had an episode where he had slurred speech and could not get himself up from the chair.  At baseline he barely walks but is able to pull himself up in the chair which was not possible this evening.  She also reports that he has been having dysphagia for the past 2 to 3 days and has barely eaten anything for the past 24 hours.  EMS was called and they activated a code stroke with last known well of sometime 7:30 PM although according to the wife last known well was at least 2-3 days ago.  He also was noted to have blood pressure systolic in the 200s  LKW: Greater than 24 hours ago IV thrombolysis given?: no, outside the window Premorbid modified Rankin scale (mRS): 4   ROS: Full ROS was performed and is negative except as noted in the HPI.   Past Medical History:  Diagnosis Date   Aortic stenosis    BPH (benign prostatic hypertrophy)    Branch retinal vein occlusion 08/01/2013   CAD  (coronary artery disease)    CVA (cerebral infarction)    Depression    Diabetes mellitus (HCC)    Diabetic retinopathy (HCC)    Gait disturbance    GERD (gastroesophageal reflux disease)    Gout    Heart murmur    HTN (hypertension)    Macular degeneration    Macular edema    Paresthesia    Pseudophakia of right eye    Ptosis    Stroke [facility])    Tremor      Family History  Problem Relation Age of Onset   Breast cancer Sister    Lung cancer Brother      Social History:   reports that he has quit smoking. His smoking use included cigarettes. He has a 40.00 pack-year smoking history. He has never used smokeless tobacco. He reports that he does not drink alcohol and does not use drugs.  Medications No current facility-administered medications for this encounter.  Current Outpatient Medications:    acetaminophen (TYLENOL) 325 MG tablet, Take 2 tablets (650 mg total) by mouth every 6 (six) hours as needed for mild pain (or Fever >/= 101)., Disp: , Rfl:    allopurinol (ZYLOPRIM) 100 MG tablet, Take 2 tablets (200 mg total) by mouth at bedtime. (Patient taking differently: Take 200 mg by mouth daily at 6 PM.), Disp: 60 tablet, Rfl: 0   amLODipine (NORVASC) 10 MG tablet, Take 1 tablet (10 mg total) by mouth daily., Disp: 30 tablet, Rfl: 0  aspirin 81 MG chewable tablet, Chew 1 tablet (81 mg total) by mouth daily., Disp: 30 tablet, Rfl: 0   Cholecalciferol (VITAMIN D-3) 125 MCG (5000 UT) TABS, Take 5,000 Units by mouth daily. K3, Disp: , Rfl:    cloNIDine (CATAPRES) 0.1 MG tablet, Take 0.1 mg by mouth 2 (two) times daily., Disp: , Rfl:    docusate sodium (COLACE) 100 MG capsule, Take 1 capsule (100 mg total) by mouth daily. (Patient taking differently: Take 100 mg by mouth 2 (two) times daily.), Disp: 30 capsule, Rfl: 0   furosemide (LASIX) 40 MG tablet, Take 1 tablet (40 mg total) by mouth daily., Disp: 30 tablet, Rfl: 0   gentamicin cream (GARAMYCIN) 0.1 %, Apply 1 application  topically 2 (two) times daily. (Patient taking differently: Apply 1 application  topically 2 (two) times daily. On toe), Disp: 30 g, Rfl: 1   hydrALAZINE (APRESOLINE) 100 MG tablet, Take 1 tablet (100 mg total) by mouth 3 (three) times daily., Disp: 90 tablet, Rfl: 0   insulin glargine (LANTUS) 100 UNIT/ML injection, Inject 0.35 mLs (35 Units total) into the skin at bedtime. (Patient taking differently: Inject 10-12 Units into the skin at bedtime. Sliding scale), Disp: 10 mL, Rfl: 0   insulin lispro (HUMALOG) 100 UNIT/ML injection, Inject 0.1-0.16 mLs (10-16 Units total) into the skin 3 (three) times daily before meals. Below 200 units = 10units 200-250=12 units 250-300=14units Over 300=16units, Disp: 10 mL, Rfl: 1   ketoconazole (NIZORAL) 2 % cream, Apply 1 application topically daily., Disp: 60 g, Rfl: 2   levothyroxine (SYNTHROID) 25 MCG tablet, Take 25 mcg by mouth every morning., Disp: , Rfl:    metoprolol tartrate (LOPRESSOR) 50 MG tablet, Take 1 tablet (50 mg total) by mouth 2 (two) times daily., Disp: 60 tablet, Rfl: 0   Omeprazole-Sodium Bicarbonate (ZEGERID) 20-1100 MG CAPS capsule, Take 1 capsule by mouth daily before breakfast., Disp: 30 capsule, Rfl: 0   OVER THE COUNTER MEDICATION, Apply 1 Application topically daily. Emuaidmax cream on toe, Disp: , Rfl:    rosuvastatin (CRESTOR) 10 MG tablet, Take 1 tablet (10 mg total) by mouth daily. (Patient taking differently: Take 10 mg by mouth at bedtime.), Disp: 30 tablet, Rfl: 0   senna (SENOKOT) 8.6 MG TABS tablet, Take 1 tablet (8.6 mg total) by mouth daily. (Patient taking differently: Take 1 tablet by mouth at bedtime.), Disp: 30 tablet, Rfl: 0   zinc gluconate 50 MG tablet, Take 1 tablet (50 mg total) by mouth daily., Disp: 30 tablet, Rfl: 0   Exam: Current vital signs: BP (!) 188/81   Pulse 63   Temp 98.2 F (36.8 C) (Oral)   Resp 13   Wt 83 kg   SpO2 94%   BMI 28.66 kg/m  Vital signs in last 24 hours: Temp:  [98.2 F (36.8  C)] 98.2 F (36.8 C) (11/02 2218) Pulse Rate:  [63] 63 (11/02 2207) Resp:  [13] 13 (11/02 2207) BP: (188)/(81) 188/81 (11/02 2207) SpO2:  [94 %] 94 % (11/02 2207) Weight:  [83 kg] 83 kg (11/02 2158) : Awake alert in no distress HEENT: Normocephalic atraumatic, right subconjunctival hemorrhage CVS: Regular rate rhythm Respiratory: Breathing well saturating normally on room air Extremities: Pitting edema in the right worse than left lower extremity. 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 Motor examination with no  drift in the upper extremities.  Right lower extremity is barely moving with 1-2/5 strength.  Left lower extremity is 4/5 with mild drift. Sensation intact on the face and arm.  Diminished in a stocking type pattern on the legs. Coordination difficult to assess but no gross dysmetria based on reach for objects given his baseline blindness NIH stroke scale-6  Labs I have reviewed labs in epic and the results pertinent to this consultation are:  CBC    Component Value Date/Time   WBC 13.5 (H) 05/13/2022 2159   RBC 5.35 05/13/2022 2159   HGB 16.7 05/13/2022 2201   HCT 49.0 05/13/2022 2201   PLT 288 05/13/2022 2159   MCV 85.4 05/13/2022 2159   MCH 29.9 05/13/2022 2159   MCHC 35.0 05/13/2022 2159   RDW 13.2 05/13/2022 2159   LYMPHSABS 1.7 05/13/2022 2159   MONOABS 1.2 (H) 05/13/2022 2159   EOSABS 0.1 05/13/2022 2159   BASOSABS 0.1 05/13/2022 2159    CMP     Component Value Date/Time   NA 125 (L) 05/13/2022 2201   NA 142 03/13/2021 0000   K 4.1 05/13/2022 2201   CL 92 (L) 05/13/2022 2201   CO2 26 04/28/2022 1425   GLUCOSE 148 (H) 05/13/2022 2201   BUN 19 05/13/2022 2201   BUN 53 (A) 03/13/2021 0000   CREATININE 1.40 (H) 05/13/2022 2201   CALCIUM 9.9 04/28/2022 1425    PROT 6.9 03/05/2021 0152   PROT 7.0 03/05/2021 0152   ALBUMIN 3.3 (L) 03/05/2021 0152   ALBUMIN 3.4 (L) 03/05/2021 0152   AST 31 03/13/2021 0000   ALT 24 03/13/2021 0000   ALKPHOS 73 03/13/2021 0000   BILITOT 1.0 03/05/2021 0152   BILITOT 1.0 03/05/2021 0152   GFRNONAA 34 (L) 04/28/2022 1425   GFRAA 25.16 03/13/2021 0000   Imaging I have reviewed the images obtained:  CT-head: No bleed.  Aspects 10   Assessment: 88 year old with multiple cerebrovascular risk factors presenting to the emergency room for evaluation of 2 to 3 days worth of dysphagia, and dysarthria along with generalized weakness.  Wife reports also episodes of whole body jerking that have been going on for over a year but have worsened in frequency happening multiple times a day over the last few months. His p.o. intake has been extremely poor over the past 2 days. He has had embolic strokes of unclear source in the past.  I suspect he might of had a new stroke causing his current presentation along with toxic metabolic encephalopathy given his sodium level of 125 and today's renal function.  I am unclear as to why he keeps having whole body jerking movements multiple times a day-Will recommend an EEG to rule out seizures.  May be generalized polymyoclonus in the setting of metabolic derangements.  Given severely elevated blood pressures, hypertensive encephalopathy should also be considered. He would benefit from stroke/TIA as well as encephalopathy and abnormal movement work-up  Impression: Dysarthria, dysphagia-evaluate for stroke-outside the window for emergent intervention Evaluate for frequent whole body jerking movements/polymyoclonus Evaluate for toxic metabolic encephalopathy Evaluate for hypertensive encephalopathy  Recommendations: Admit to hospitalist Frequent neurochecks Telemetry Continue aspirin MRI brain without contrast CT angiography head and neck would be ideal but given his deranged renal  function, will get MRI of the head without contrast and carotid Dopplers. 2D echo A1c Lipid panel Routine EEG PT OT Speech therapy I would also check B12, TSH and ammonia levels. Check UA chest x-ray-has leukocytosis   Plan relayed to  Dr. Fredderick Phenix and Dr. Lajean Silvius in the emergency room  Stroke team to follow  -- Milon Dikes, MD Neurologist [facility] Pager: [phone]

## 2022-05-13 NOTE — ED Triage Notes (Signed)
Pt brought to ED by [facility] for evaluation of CODE STROKE. Per EMS, wife noticed that pt was having slurred speech at [place]. Upon arrival to residence, pt was AOX4. However, slurred speech began again and pt noted to have acute HTN reading 206/96 with EMS at 2130.   18G LAC  EMS Vitals BP 204/86 HR 60 SPO2 98% RA CBG 176

## 2022-05-14 ENCOUNTER — Observation Stay (HOSPITAL_COMMUNITY): Payer: Medicare Other

## 2022-05-14 ENCOUNTER — Inpatient Hospital Stay (HOSPITAL_COMMUNITY): Payer: Medicare Other

## 2022-05-14 ENCOUNTER — Observation Stay (HOSPITAL_BASED_OUTPATIENT_CLINIC_OR_DEPARTMENT_OTHER): Payer: Medicare Other

## 2022-05-14 ENCOUNTER — Telehealth: Payer: Self-pay

## 2022-05-14 ENCOUNTER — Encounter (HOSPITAL_COMMUNITY): Payer: Self-pay | Admitting: Internal Medicine

## 2022-05-14 DIAGNOSIS — I639 Cerebral infarction, unspecified: Secondary | ICD-10-CM | POA: Diagnosis not present

## 2022-05-14 DIAGNOSIS — E871 Hypo-osmolality and hyponatremia: Secondary | ICD-10-CM | POA: Diagnosis present

## 2022-05-14 DIAGNOSIS — R4182 Altered mental status, unspecified: Secondary | ICD-10-CM | POA: Diagnosis not present

## 2022-05-14 DIAGNOSIS — Z953 Presence of xenogenic heart valve: Secondary | ICD-10-CM | POA: Diagnosis not present

## 2022-05-14 DIAGNOSIS — H1131 Conjunctival hemorrhage, right eye: Secondary | ICD-10-CM | POA: Diagnosis present

## 2022-05-14 DIAGNOSIS — Z8673 Personal history of transient ischemic attack (TIA), and cerebral infarction without residual deficits: Secondary | ICD-10-CM

## 2022-05-14 DIAGNOSIS — G9341 Metabolic encephalopathy: Secondary | ICD-10-CM | POA: Diagnosis not present

## 2022-05-14 DIAGNOSIS — E11621 Type 2 diabetes mellitus with foot ulcer: Secondary | ICD-10-CM | POA: Diagnosis present

## 2022-05-14 DIAGNOSIS — E11622 Type 2 diabetes mellitus with other skin ulcer: Secondary | ICD-10-CM | POA: Diagnosis present

## 2022-05-14 DIAGNOSIS — F32A Depression, unspecified: Secondary | ICD-10-CM | POA: Diagnosis present

## 2022-05-14 DIAGNOSIS — I672 Cerebral atherosclerosis: Secondary | ICD-10-CM | POA: Diagnosis present

## 2022-05-14 DIAGNOSIS — E440 Moderate protein-calorie malnutrition: Secondary | ICD-10-CM | POA: Diagnosis present

## 2022-05-14 DIAGNOSIS — M109 Gout, unspecified: Secondary | ICD-10-CM | POA: Diagnosis present

## 2022-05-14 DIAGNOSIS — I2510 Atherosclerotic heart disease of native coronary artery without angina pectoris: Secondary | ICD-10-CM | POA: Diagnosis present

## 2022-05-14 DIAGNOSIS — I130 Hypertensive heart and chronic kidney disease with heart failure and stage 1 through stage 4 chronic kidney disease, or unspecified chronic kidney disease: Secondary | ICD-10-CM | POA: Diagnosis present

## 2022-05-14 DIAGNOSIS — E039 Hypothyroidism, unspecified: Secondary | ICD-10-CM | POA: Diagnosis present

## 2022-05-14 DIAGNOSIS — I5033 Acute on chronic diastolic (congestive) heart failure: Secondary | ICD-10-CM | POA: Diagnosis not present

## 2022-05-14 DIAGNOSIS — R569 Unspecified convulsions: Secondary | ICD-10-CM | POA: Diagnosis not present

## 2022-05-14 DIAGNOSIS — D72829 Elevated white blood cell count, unspecified: Secondary | ICD-10-CM | POA: Diagnosis present

## 2022-05-14 DIAGNOSIS — N1832 Chronic kidney disease, stage 3b: Secondary | ICD-10-CM | POA: Diagnosis present

## 2022-05-14 DIAGNOSIS — R471 Dysarthria and anarthria: Secondary | ICD-10-CM | POA: Diagnosis present

## 2022-05-14 DIAGNOSIS — I10 Essential (primary) hypertension: Secondary | ICD-10-CM

## 2022-05-14 DIAGNOSIS — G8191 Hemiplegia, unspecified affecting right dominant side: Secondary | ICD-10-CM | POA: Diagnosis present

## 2022-05-14 DIAGNOSIS — N400 Enlarged prostate without lower urinary tract symptoms: Secondary | ICD-10-CM | POA: Diagnosis present

## 2022-05-14 DIAGNOSIS — E538 Deficiency of other specified B group vitamins: Secondary | ICD-10-CM | POA: Diagnosis present

## 2022-05-14 DIAGNOSIS — G459 Transient cerebral ischemic attack, unspecified: Secondary | ICD-10-CM | POA: Diagnosis not present

## 2022-05-14 DIAGNOSIS — K219 Gastro-esophageal reflux disease without esophagitis: Secondary | ICD-10-CM | POA: Diagnosis present

## 2022-05-14 DIAGNOSIS — E861 Hypovolemia: Secondary | ICD-10-CM | POA: Diagnosis present

## 2022-05-14 DIAGNOSIS — I69391 Dysphagia following cerebral infarction: Secondary | ICD-10-CM | POA: Diagnosis not present

## 2022-05-14 DIAGNOSIS — E1122 Type 2 diabetes mellitus with diabetic chronic kidney disease: Secondary | ICD-10-CM | POA: Diagnosis present

## 2022-05-14 DIAGNOSIS — L97519 Non-pressure chronic ulcer of other part of right foot with unspecified severity: Secondary | ICD-10-CM | POA: Diagnosis present

## 2022-05-14 LAB — COMPREHENSIVE METABOLIC PANEL
ALT: 21 U/L (ref 0–44)
AST: 22 U/L (ref 15–41)
Albumin: 3.3 g/dL — ABNORMAL LOW (ref 3.5–5.0)
Alkaline Phosphatase: 65 U/L (ref 38–126)
Anion gap: 10 (ref 5–15)
BUN: 16 mg/dL (ref 8–23)
CO2: 24 mmol/L (ref 22–32)
Calcium: 9.8 mg/dL (ref 8.9–10.3)
Chloride: 94 mmol/L — ABNORMAL LOW (ref 98–111)
Creatinine, Ser: 1.49 mg/dL — ABNORMAL HIGH (ref 0.61–1.24)
GFR, Estimated: 45 mL/min — ABNORMAL LOW (ref >60)
Glucose, Bld: 129 mg/dL — ABNORMAL HIGH (ref 70–99)
Potassium: 4.2 mmol/L (ref 3.5–5.1)
Sodium: 128 mmol/L — ABNORMAL LOW (ref 135–145)
Total Bilirubin: 1 mg/dL (ref 0.3–1.2)
Total Protein: 6.2 g/dL — ABNORMAL LOW (ref 6.5–8.1)

## 2022-05-14 LAB — ECHOCARDIOGRAM COMPLETE
AR max vel: 1.17 cm2
AV Area VTI: 1.56 cm2
AV Area mean vel: 1.16 cm2
AV Mean grad: 8 mmHg
AV Peak grad: 15.8 mmHg
Ao pk vel: 1.99 m/s
Area-P 1/2: 4.39 cm2
S' Lateral: 3.1 cm
Weight: 2927.71 oz

## 2022-05-14 LAB — CBG MONITORING, ED
Glucose-Capillary: 135 mg/dL — ABNORMAL HIGH (ref 70–99)
Glucose-Capillary: 126 mg/dL — ABNORMAL HIGH (ref 70–99)
Glucose-Capillary: 117 mg/dL — ABNORMAL HIGH (ref 70–99)
Glucose-Capillary: 167 mg/dL — ABNORMAL HIGH (ref 70–99)

## 2022-05-14 LAB — CORTISOL: Cortisol, Plasma: 20.2 ug/dL

## 2022-05-14 LAB — GLUCOSE, CAPILLARY
Glucose-Capillary: 169 mg/dL — ABNORMAL HIGH (ref 70–99)
Glucose-Capillary: 169 mg/dL — ABNORMAL HIGH (ref 70–99)

## 2022-05-14 LAB — CBC
HCT: 44.6 % (ref 39.0–52.0)
Hemoglobin: 15.4 g/dL (ref 13.0–17.0)
MCH: 29.2 pg (ref 26.0–34.0)
MCHC: 34.5 g/dL (ref 30.0–36.0)
MCV: 84.6 fL (ref 80.0–100.0)
Platelets: 261 10*3/uL (ref 150–400)
RBC: 5.27 MIL/uL (ref 4.22–5.81)
RDW: 13.2 % (ref 11.5–15.5)
WBC: 11 10*3/uL — ABNORMAL HIGH (ref 4.0–10.5)
nRBC: 0 % (ref 0.0–0.2)

## 2022-05-14 LAB — LIPID PANEL
Cholesterol: 117 mg/dL (ref 0–200)
HDL: 46 mg/dL (ref >40)
LDL Cholesterol: 54 mg/dL (ref 0–99)
Total CHOL/HDL Ratio: 2.5 RATIO
Triglycerides: 85 mg/dL (ref <150)
VLDL: 17 mg/dL (ref 0–40)

## 2022-05-14 LAB — SODIUM, URINE, RANDOM: Sodium, Ur: 124 mmol/L

## 2022-05-14 LAB — HEMOGLOBIN A1C
Hgb A1c MFr Bld: 4.9 % (ref 4.8–5.6)
Mean Plasma Glucose: 93.93 mg/dL

## 2022-05-14 LAB — TSH: TSH: 3.717 u[IU]/mL (ref 0.350–4.500)

## 2022-05-14 LAB — VITAMIN B12: Vitamin B-12: 162 pg/mL — ABNORMAL LOW (ref 180–914)

## 2022-05-14 LAB — AMMONIA: Ammonia: 25 umol/L (ref 9–35)

## 2022-05-14 MED ORDER — INSULIN ASPART 100 UNIT/ML IJ SOLN
0.0000 [IU] | INTRAMUSCULAR | Status: DC
  Administered 2022-05-14 – 2022-05-15 (×4): 1 [IU] via SUBCUTANEOUS

## 2022-05-14 MED ORDER — ACETAMINOPHEN 650 MG RE SUPP: 650.0000 mg | RECTAL | Status: DC | PRN

## 2022-05-14 MED ORDER — VITAMIN B-12 1000 MCG PO TABS
1000.0000 ug | ORAL_TABLET | Freq: Every day | ORAL | Status: DC
  Administered 2022-05-15 – 2022-05-19 (×5): 1000 ug via ORAL
  Filled 2022-05-14 (×5): qty 1

## 2022-05-14 MED ORDER — FONDAPARINUX SODIUM 2.5 MG/0.5ML ~~LOC~~ SOLN
2.5000 mg | Freq: Every day | SUBCUTANEOUS | Status: DC
  Administered 2022-05-14 – 2022-05-16 (×3): 2.5 mg via SUBCUTANEOUS
  Filled 2022-05-14 (×4): qty 0.5

## 2022-05-14 MED ORDER — ASPIRIN 81 MG PO TBEC
81.0000 mg | DELAYED_RELEASE_TABLET | Freq: Every day | ORAL | Status: DC
  Administered 2022-05-15 – 2022-05-19 (×5): 81 mg via ORAL
  Filled 2022-05-14 (×5): qty 1

## 2022-05-14 MED ORDER — AMLODIPINE BESYLATE 5 MG PO TABS
10.0000 mg | ORAL_TABLET | Freq: Every day | ORAL | Status: DC
  Administered 2022-05-14 – 2022-05-19 (×6): 10 mg via ORAL
  Filled 2022-05-14 (×6): qty 2

## 2022-05-14 MED ORDER — HYDRALAZINE HCL 20 MG/ML IJ SOLN
10.0000 mg | INTRAMUSCULAR | Status: DC | PRN
  Administered 2022-05-14 (×2): 10 mg via INTRAVENOUS
  Filled 2022-05-14 (×2): qty 1

## 2022-05-14 MED ORDER — ACETAMINOPHEN 325 MG PO TABS
650.0000 mg | ORAL_TABLET | ORAL | Status: DC | PRN
  Administered 2022-05-15: 650 mg via ORAL
  Filled 2022-05-14: qty 2

## 2022-05-14 MED ORDER — LEVOTHYROXINE SODIUM 25 MCG PO TABS
25.0000 ug | ORAL_TABLET | Freq: Every day | ORAL | Status: DC
  Administered 2022-05-15 – 2022-05-19 (×5): 25 ug via ORAL
  Filled 2022-05-14 (×5): qty 1

## 2022-05-14 MED ORDER — CYANOCOBALAMIN 1000 MCG/ML IJ SOLN
1000.0000 ug | Freq: Once | INTRAMUSCULAR | Status: AC
  Administered 2022-05-14: 1000 ug via INTRAMUSCULAR
  Filled 2022-05-14: qty 1

## 2022-05-14 MED ORDER — LABETALOL HCL 5 MG/ML IV SOLN: 10.0000 mg | INTRAVENOUS | Status: DC | PRN

## 2022-05-14 MED ORDER — CLONIDINE HCL 0.1 MG PO TABS
0.1000 mg | ORAL_TABLET | Freq: Two times a day (BID) | ORAL | Status: DC
  Administered 2022-05-14 – 2022-05-18 (×8): 0.1 mg via ORAL
  Filled 2022-05-14 (×8): qty 1

## 2022-05-14 MED ORDER — ROSUVASTATIN CALCIUM 5 MG PO TABS
10.0000 mg | ORAL_TABLET | Freq: Every day | ORAL | Status: DC
  Administered 2022-05-14 – 2022-05-18 (×5): 10 mg via ORAL
  Filled 2022-05-14 (×5): qty 2

## 2022-05-14 MED ORDER — STROKE: EARLY STAGES OF RECOVERY BOOK
Freq: Once | Status: AC
  Filled 2022-05-14: qty 1

## 2022-05-14 MED ORDER — ACETAMINOPHEN 160 MG/5ML PO SOLN: 650.0000 mg | ORAL | Status: DC | PRN

## 2022-05-14 MED ORDER — METOPROLOL TARTRATE 25 MG PO TABS
50.0000 mg | ORAL_TABLET | Freq: Two times a day (BID) | ORAL | Status: DC
  Administered 2022-05-14 – 2022-05-16 (×5): 50 mg via ORAL
  Filled 2022-05-14 (×5): qty 2

## 2022-05-14 MED ORDER — ASPIRIN 300 MG RE SUPP
300.0000 mg | Freq: Every day | RECTAL | Status: DC
  Administered 2022-05-14: 300 mg via RECTAL
  Filled 2022-05-14: qty 1

## 2022-05-14 MED ORDER — ASPIRIN 325 MG PO TABS
325.0000 mg | ORAL_TABLET | Freq: Every day | ORAL | Status: DC
  Filled 2022-05-14: qty 1

## 2022-05-14 MED ORDER — AMLODIPINE BESYLATE 5 MG PO TABS: 10.0000 mg | ORAL_TABLET | Freq: Every day | ORAL | Status: DC

## 2022-05-14 MED ORDER — SODIUM CHLORIDE 0.9 % IV SOLN: INTRAVENOUS | Status: DC

## 2022-05-14 NOTE — H&P (Signed)
History and Physical    Johnny [name] [id number] DOB: 22-Nov-1932 DOA: 05/13/2022  Patient coming from: Home.  History obtained from patient's wife, previous records.  Chief Complaint: Slurred speech and difficulty swallowing.  HPI: Johnny [name] is a 88 y.o. male with history of CAD status post CABG, bioprosthetic aortic valve replacement, diabetes mellitus type 2, hypertension, prior stroke, chronic kidney disease stage III, chronic nonhealing ulcer on the second toe of the right foot was found to have increasing slurred speech yesterday and was brought to the ER.  Over the last 3 days patient has been having difficulty swallowing and has not had any much to eat.  Also has been found to have some jerking spells which involves his whole body over the last 2 to 3 months.  Last evening around 7:30 PM patient was noticed to be more weaker than usual was noticed to have slurred speech and was brought to the ER.  ED Course: In the ER patient was evaluated by neurologist had CT head done.  Patient's labs show hyponatremia and leukocytosis.  Patient admitted for further management for possible stroke.  On exam patient has weakness on the left upper and lower extremity.  Failed stroke swallow.  Review of Systems: As per HPI, rest all negative.   Past Medical History:  Diagnosis Date   Aortic stenosis    BPH (benign prostatic hypertrophy)    Branch retinal vein occlusion 08/01/2013   CAD (coronary artery disease)    CVA (cerebral infarction)    Depression    Diabetes mellitus (HCC)    Diabetic retinopathy (HCC)    Gait disturbance    GERD (gastroesophageal reflux disease)    Gout    Heart murmur    HTN (hypertension)    Macular degeneration    Macular edema    Paresthesia    Pseudophakia of right eye    Ptosis    Stroke [facility])    Tremor     Past Surgical History:  Procedure Laterality Date   AMPUTATION Right 03/07/2021   Procedure: AMPUTATION GREAT TOE;  Surgeon: Edrick Kins, DPM;  Location: [facility];  Service: Podiatry;  Laterality: Right;   AORTIC VALVE REPLACEMENT  03/26/2013   CATARACT EXTRACTION W/ INTRAOCULAR LENS IMPLANT  11/01/2013   CATARACT EXTRACTION W/ INTRAOCULAR LENS IMPLANT Right 12/18/2009   COLONOSCOPY W/ POLYPECTOMY     CORONARY ARTERY BYPASS GRAFT  03/26/2013   EYE SURGERY  03/19/2016   Ocular tube shunt insertion   PARS PLANA VITRECTOMY  03/19/2016   PARS PLANA VITRECTOMY  11/08/2013   PARS PLANA VITRECTOMY  08/02/2012   PROSTATE SURGERY     TEE WITHOUT CARDIOVERSION N/A 05/02/2017   Procedure: TRANSESOPHAGEAL ECHOCARDIOGRAM (TEE);  Surgeon: Dorothy Spark, MD;  Location: [facility] ENDOSCOPY;  Service: Cardiovascular;  Laterality: N/A;     reports that he has quit smoking. His smoking use included cigarettes. He has a 40.00 pack-year smoking history. He has never used smokeless tobacco. He reports that he does not drink alcohol and does not use drugs.  Allergies  Allergen Reactions   Gabapentin Other (See Comments)    Unknown   Glyxambi [Empagliflozin-Linagliptin] Other (See Comments)    Lethargy, slurring speaking.    Heparin     Other reaction(s): Heparin-Induced Thrombocytopenia    Family History  Problem Relation Age of Onset   Breast cancer Sister    Lung cancer Brother     Prior to Admission medications   Medication Sig Start  Date End Date Taking? Authorizing Provider  acetaminophen (TYLENOL) 325 MG tablet Take 2 tablets (650 mg total) by mouth every 6 (six) hours as needed for mild pain (or Fever >/= 101). 03/11/21   Mariel Aloe, MD  allopurinol (ZYLOPRIM) 100 MG tablet Take 2 tablets (200 mg total) by mouth at bedtime. Patient taking differently: Take 200 mg by mouth daily at 6 PM. 04/03/21   Medina-Vargas, Monina C, NP  amLODipine (NORVASC) 10 MG tablet Take 1 tablet (10 mg total) by mouth daily. 04/03/21   Medina-Vargas, Monina C, NP  aspirin 81 MG chewable tablet Chew 1 tablet (81 mg total) by mouth daily. 04/03/21    Medina-Vargas, Monina C, NP  Cholecalciferol (VITAMIN D-3) 125 MCG (5000 UT) TABS Take 5,000 Units by mouth daily. K3    [provider]  cloNIDine (CATAPRES) 0.1 MG tablet Take 0.1 mg by mouth 2 (two) times daily. 01/21/22   [provider]  docusate sodium (COLACE) 100 MG capsule Take 1 capsule (100 mg total) by mouth daily. Patient taking differently: Take 100 mg by mouth 2 (two) times daily. 04/03/21   Medina-Vargas, Monina C, NP  furosemide (LASIX) 40 MG tablet Take 1 tablet (40 mg total) by mouth daily. 04/03/21   Medina-Vargas, Monina C, NP  gentamicin cream (GARAMYCIN) 0.1 % Apply 1 application topically 2 (two) times daily. Patient taking differently: Apply 1 application  topically 2 (two) times daily. On toe 09/09/21   Edrick Kins, DPM  hydrALAZINE (APRESOLINE) 100 MG tablet Take 1 tablet (100 mg total) by mouth 3 (three) times daily. 04/03/21   Medina-Vargas, Monina C, NP  insulin glargine (LANTUS) 100 UNIT/ML injection Inject 0.35 mLs (35 Units total) into the skin at bedtime. Patient taking differently: Inject 10-12 Units into the skin at bedtime. Sliding scale 04/03/21   Medina-Vargas, Monina C, NP  insulin lispro (HUMALOG) 100 UNIT/ML injection Inject 0.1-0.16 mLs (10-16 Units total) into the skin 3 (three) times daily before meals. Below 200 units = 10units 200-250=12 units 250-300=14units Over 300=16units 04/03/21   Medina-Vargas, Monina C, NP  ketoconazole (NIZORAL) 2 % cream Apply 1 application topically daily. 05/05/21   Criselda Peaches, DPM  levothyroxine (SYNTHROID) 25 MCG tablet Take 25 mcg by mouth every morning. 04/01/22   [provider]  metoprolol tartrate (LOPRESSOR) 50 MG tablet Take 1 tablet (50 mg total) by mouth 2 (two) times daily. 04/03/21   Medina-Vargas, Monina C, NP  Omeprazole-Sodium Bicarbonate (ZEGERID) 20-1100 MG CAPS capsule Take 1 capsule by mouth daily before breakfast. 04/03/21   Medina-Vargas, Monina C, NP  OVER THE COUNTER  MEDICATION Apply 1 Application topically daily. Emuaidmax cream on toe    [provider]  rosuvastatin (CRESTOR) 10 MG tablet Take 1 tablet (10 mg total) by mouth daily. Patient taking differently: Take 10 mg by mouth at bedtime. 04/03/21   Medina-Vargas, Monina C, NP  senna (SENOKOT) 8.6 MG TABS tablet Take 1 tablet (8.6 mg total) by mouth daily. Patient taking differently: Take 1 tablet by mouth at bedtime. 04/03/21   Medina-Vargas, Monina C, NP  zinc gluconate 50 MG tablet Take 1 tablet (50 mg total) by mouth daily. 04/03/21   Medina-Vargas, Senaida Lange, NP    Physical Exam: Constitutional: Moderately built and nourished. Vitals:   05/13/22 2218 05/13/22 2245 05/14/22 0145 05/14/22 0200  BP:  (!) 189/80  (!) 195/82  Pulse:  (!) 56  (!) 55  Resp:  16  16  Temp: 98.2 F (36.8 C)  98 F (36.7 C)   TempSrc: Oral  Oral   SpO2:  94%  94%  Weight:       Eyes: Anicteric no pallor. ENMT: No discharge from the ears eyes nose and mouth. Neck: No mass felt.  No neck rigidity. Respiratory: No rhonchi or crepitations. Cardiovascular: S1 S2 heard. Abdomen: Soft nontender bowel sound present. Musculoskeletal: No edema. Skin: Ulcer on the right foot second toe. Neurologic: Alert awake oriented to time place and person.  Patient is blind.  Weakness on the left upper and lower extremity about 4 x 5.  Right upper and lower extremities about 5 x 5.  No obvious facial asymmetry tongue is midline.  Pupils reacting to light. Psychiatric: Appears normal.  Normal affect.   Labs on Admission: I have personally reviewed following labs and imaging studies  CBC: Recent Labs  Lab 05/13/22 2159 05/13/22 2201  WBC 13.5*  --   NEUTROABS 10.4*  --   HGB 16.0 16.7  HCT 45.7 49.0  MCV 85.4  --   PLT 288  --    Basic Metabolic Panel: Recent Labs  Lab 05/13/22 2159 05/13/22 2201  NA 127* 125*  K 4.1 4.1  CL 95* 92*  CO2 21*  --   GLUCOSE 153* 148*  BUN 19 19  CREATININE 1.59* 1.40*   CALCIUM 10.2  --    GFR: Estimated Creatinine Clearance: 38.3 mL/min (A) (by C-G formula based on SCr of 1.4 mg/dL (H)). Liver Function Tests: Recent Labs  Lab 05/13/22 2159  AST 29  ALT 25  ALKPHOS 66  BILITOT 1.0  PROT 6.9  ALBUMIN 3.6   No results for input(s): "LIPASE", "AMYLASE" in the last 168 hours. No results for input(s): "AMMONIA" in the last 168 hours. Coagulation Profile: Recent Labs  Lab 05/13/22 2159  INR 1.1   Cardiac Enzymes: No results for input(s): "CKTOTAL", "CKMB", "CKMBINDEX", "TROPONINI" in the last 168 hours. BNP (last 3 results) No results for input(s): "PROBNP" in the last 8760 hours. HbA1C: No results for input(s): "HGBA1C" in the last 72 hours. CBG: Recent Labs  Lab 05/13/22 2154  GLUCAP 168*   Lipid Profile: No results for input(s): "CHOL", "HDL", "LDLCALC", "TRIG", "CHOLHDL", "LDLDIRECT" in the last 72 hours. Thyroid Function Tests: No results for input(s): "TSH", "T4TOTAL", "FREET4", "T3FREE", "THYROIDAB" in the last 72 hours. Anemia Panel: No results for input(s): "VITAMINB12", "FOLATE", "FERRITIN", "TIBC", "IRON", "RETICCTPCT" in the last 72 hours. Urine analysis:    Component Value Date/Time   COLORURINE YELLOW 03/04/2021 1922   APPEARANCEUR CLEAR 03/04/2021 1922   LABSPEC 1.008 03/04/2021 1922   PHURINE 7.0 03/04/2021 1922   GLUCOSEU NEGATIVE 03/04/2021 1922   HGBUR NEGATIVE 03/04/2021 [place] NEGATIVE 03/04/2021 [place] NEGATIVE 03/04/2021 1922   PROTEINUR 100 (A) 03/04/2021 1922   UROBILINOGEN 0.2 01/02/2014 2236   NITRITE NEGATIVE 03/04/2021 1922   LEUKOCYTESUR NEGATIVE 03/04/2021 1922   Sepsis Labs: @LABRCNTIP (procalcitonin:4,lacticidven:4) )No results found for this or any previous visit (from the past 240 hour(s)).   Radiological Exams on Admission: CT HEAD CODE STROKE WO CONTRAST  Result Date: 05/13/2022 CLINICAL DATA:  Code stroke. Initial evaluation for neuro deficit, stroke suspected.  EXAM: CT HEAD WITHOUT CONTRAST TECHNIQUE: Contiguous axial images were obtained from the base of the skull through the vertex without intravenous contrast. RADIATION DOSE REDUCTION: This exam was performed according to the departmental dose-optimization program which includes automated exposure control, adjustment of the mA and/or kV according to patient size and/or  use of iterative reconstruction technique. COMPARISON:  Prior study from 03/10/2021. FINDINGS: Brain: Moderately advanced cerebral atrophy with mild chronic small vessel ischemic disease. Small remote right cerebellar infarct. No acute intracranial hemorrhage. No acute large vessel territory infarct. No mass lesion or midline shift. No hydrocephalus or extra-axial fluid collection. Vascular: No abnormal hyperdense vessel. Calcified atherosclerosis present at the skull base. Skull: Scalp soft tissues and calvarium demonstrate no acute finding. Sinuses/Orbits: Abnormal hyperdensity seen involving the posterior aspect of the right globe, suggestive of possible hemorrhage (series 2, image 9). Postoperative changes noted about the contralateral left globe. Paranasal sinuses are clear. Other: No mastoid effusion. ASPECTS [facility] Stroke Program Early CT Score) - Ganglionic level infarction (caudate, lentiform nuclei, internal capsule, insula, M1-M3 cortex): 7 - Supraganglionic infarction (M4-M6 cortex): 3 Total score (0-10 with 10 being normal): 10 IMPRESSION: 1. No acute intracranial abnormality. 2. ASPECTS is 10. 3. Hyperdensity involving the posterior aspect of the right globe, suggesting vitreous hemorrhage. Correlation with physical exam recommended. 4. Atrophy with chronic microvascular ischemic disease. Small remote right cerebellar infarct. These results were communicated to Dr. Rory Percy at 10:06 pm on 05/13/2022 by text page via the [facility] messaging system. Electronically Signed   By: Jeannine Boga M.D.   On: 05/13/2022 22:08    EKG: Independently  reviewed.  7 has bradycardia with first-degree AV block.  Assessment/Plan Principal Problem:   TIA (transient ischemic attack) Active Problems:   Type 2 diabetes mellitus with stage 3b chronic kidney disease, with long-term current use of insulin (HCC)   Essential hypertension   History of CVA (cerebrovascular accident)   Aortic valve replaced   CKD (chronic kidney disease) stage 3, GFR 30-59 ml/min (HCC)    Possible stroke versus TIA- appreciate neurology input.  MRI brain, MR angiogram head and carotid Dopplers have been ordered.  Check 2D echo keep patient on neurochecks, permissive hypertension.  Check hemoglobin A1c lipid panel.  Antiplatelet agents statins.  Speech therapy evaluation.  EEG. Right foot second toe nonhealing ulcer -x-rays pending.  Dr. Amalia Hailey podiatrist was planning surgery next week. Hyponatremia -could be from poor intake.  Check urine studies.  Follow metabolic panel closely.  For now and gently hydrating. Diabetes mellitus type 2 takes Lantus insulin at bedtime based on the CBG.  Usually takes around 10 to 12 units.  Now patient is n.p.o.  We will keep patient on sliding scale coverage check hemoglobin A1c follow CBGs closely. Hypertension -since patient is n.p.o. and allowing permissive hypertension we will keep patient as needed IV hydralazine for systolic blood pressure more than 220. Chronic kidney disease stage III creatinine appears to be at baseline. Leukocytosis -chest x-ray UA and x-ray for the right foot is pending. Bioprosthetic arctic valve replacement. CAD status post CABG.   DVT prophylaxis: Arixtra.  Patient is allergic to heparin. Code Status: Full code. Family Communication: Patient's wife. Disposition Plan: Home when stable. Consults called: Neurologist. Admission status: Observation.

## 2022-05-14 NOTE — ED Notes (Signed)
ED TO INPATIENT HANDOFF REPORT  ED Nurse Name and Phone #: Catcher Dehoyos RN [phone]  S Name/Age/Gender Johnny [name] 88 y.o. male Room/Bed: 039C/039C  Code Status   Code Status: Full Code  Home/SNF/Other Rehab Patient oriented to: self, place, time, and situation Is this baseline? Yes   Triage Complete: Triage complete  Chief Complaint TIA (transient ischemic attack) [G45.9]  Triage Note Pt brought to ED by [facility] for evaluation of CODE STROKE. Per EMS, wife noticed that pt was having slurred speech at 1930. Upon arrival to residence, pt was AOX4. However, slurred speech began again and pt noted to have acute HTN reading 206/96 with EMS at 2130.   18G LAC  EMS Vitals BP 204/86 HR 60 SPO2 98% RA CBG 176   Allergies Allergies  Allergen Reactions   Gabapentin Other (See Comments)    Unknown   Glyxambi [Empagliflozin-Linagliptin] Other (See Comments)    Lethargy, slurring speaking.    Heparin     Other reaction(s): Heparin-Induced Thrombocytopenia    Level of Care/Admitting Diagnosis ED Disposition     ED Disposition  Admit   Condition  --   Comment  Hospital Area: [facility] [[id number]]  Level of Care: Telemetry Medical [104]  May admit patient to [facility] or [facility] if equivalent level of care is available:: No  Covid Evaluation: Asymptomatic - no recent exposure (last 10 days) testing not required  Diagnosis: TIA (transient ischemic attack) [130865]  Admitting Physician: Eduard Clos [phone]  Attending Physician: Noralee Stain [phone]  Certification:: I certify this patient will need inpatient services for at least 2 midnights  Estimated Length of Stay: 2          B Medical/Surgery History Past Medical History:  Diagnosis Date   Aortic stenosis    BPH (benign prostatic hypertrophy)    Branch retinal vein occlusion 08/01/2013   CAD (coronary artery disease)    CVA (cerebral infarction)    Depression    Diabetes  mellitus (HCC)    Diabetic retinopathy (HCC)    Gait disturbance    GERD (gastroesophageal reflux disease)    Gout    Heart murmur    HTN (hypertension)    Macular degeneration    Macular edema    Paresthesia    Pseudophakia of right eye    Ptosis    Stroke [facility])    Tremor    Past Surgical History:  Procedure Laterality Date   AMPUTATION Right 03/07/2021   Procedure: AMPUTATION GREAT TOE;  Surgeon: Felecia Shelling, DPM;  Location: [facility] OR;  Service: Podiatry;  Laterality: Right;   AORTIC VALVE REPLACEMENT  03/26/2013   CATARACT EXTRACTION W/ INTRAOCULAR LENS IMPLANT  11/01/2013   CATARACT EXTRACTION W/ INTRAOCULAR LENS IMPLANT Right 12/18/2009   COLONOSCOPY W/ POLYPECTOMY     CORONARY ARTERY BYPASS GRAFT  03/26/2013   EYE SURGERY  03/19/2016   Ocular tube shunt insertion   PARS PLANA VITRECTOMY  03/19/2016   PARS PLANA VITRECTOMY  11/08/2013   PARS PLANA VITRECTOMY  08/02/2012   PROSTATE SURGERY     TEE WITHOUT CARDIOVERSION N/A 05/02/2017   Procedure: TRANSESOPHAGEAL ECHOCARDIOGRAM (TEE);  Surgeon: Lars Masson, MD;  Location: [facility] ENDOSCOPY;  Service: Cardiovascular;  Laterality: N/A;     A IV Location/Drains/Wounds Patient Lines/Drains/Airways Status     Active Line/Drains/Airways     Name Placement date Placement time Site Days   Peripheral IV 05/13/22 18 G Left Antecubital 05/13/22  --  Antecubital  1   External Urinary Catheter 03/06/21  1900  --  434   Incision (Closed) 03/07/21 Foot Right 03/07/21  0813  -- 433            Intake/Output Last 24 hours  Intake/Output Summary (Last 24 hours) at 05/14/2022 1508 Last data filed at 05/14/2022 0086 Gross per 24 hour  Intake --  Output 560 ml  Net -560 ml    Labs/Imaging Results for orders placed or performed during the hospital encounter of 05/13/22 (from the past 48 hour(s))  CBG monitoring, ED     Status: Abnormal   Collection Time: 05/13/22  9:54 PM  Result Value Ref Range   Glucose-Capillary 168 (H)  70 - 99 mg/dL    Comment: Glucose reference range applies only to samples taken after fasting for at least 8 hours.  Ethanol     Status: None   Collection Time: 05/13/22  9:59 PM  Result Value Ref Range   Alcohol, Ethyl (B) <10 <10 mg/dL    Comment: (NOTE) Lowest detectable limit for serum alcohol is 10 mg/dL.  For medical purposes only. Performed at [facility], [address] [place], [place] [address]   Protime-INR     Status: None   Collection Time: 05/13/22  9:59 PM  Result Value Ref Range   Prothrombin Time 13.9 11.4 - 15.2 seconds   INR 1.1 0.8 - 1.2    Comment: (NOTE) INR goal varies based on device and disease states. Performed at [facility], [address] [place], [place] [address]   APTT     Status: None   Collection Time: 05/13/22  9:59 PM  Result Value Ref Range   aPTT 30 24 - 36 seconds    Comment: Performed at [facility], [address] [place], [place] [address]  CBC     Status: Abnormal   Collection Time: 05/13/22  9:59 PM  Result Value Ref Range   WBC 13.5 (H) 4.0 - 10.5 K/uL   RBC 5.35 4.22 - 5.81 MIL/uL   Hemoglobin 16.0 13.0 - 17.0 g/dL   HCT 80.9 98.3 - 38.2 %   MCV 85.4 80.0 - 100.0 fL   MCH 29.9 26.0 - 34.0 pg   MCHC 35.0 30.0 - 36.0 g/dL   RDW 50.5 39.7 - 67.3 %   Platelets 288 150 - 400 K/uL   nRBC 0.0 0.0 - 0.2 %    Comment: Performed at [facility], [address] [place], [place] [address]  Differential     Status: Abnormal   Collection Time: 05/13/22  9:59 PM  Result Value Ref Range   Neutrophils Relative % 77 %   Neutro Abs 10.4 (H) 1.7 - 7.7 K/uL   Lymphocytes Relative 13 %   Lymphs Abs 1.7 0.7 - 4.0 K/uL   Monocytes Relative 9 %   Monocytes Absolute 1.2 (H) 0.1 - 1.0 K/uL   Eosinophils Relative 1 %   Eosinophils Absolute 0.1 0.0 - 0.5 K/uL   Basophils Relative 0 %   Basophils Absolute 0.1 0.0 - 0.1 K/uL   Immature Granulocytes 0 %   Abs Immature Granulocytes 0.04 0.00 - 0.07 K/uL    Comment:  Performed at [facility], [address] [place], [place] [address]  Comprehensive metabolic panel     Status: Abnormal   Collection Time: 05/13/22  9:59 PM  Result Value Ref Range   Sodium 127 (L) 135 -  145 mmol/L   Potassium 4.1 3.5 - 5.1 mmol/L   Chloride 95 (L) 98 - 111 mmol/L   CO2 21 (L) 22 - 32 mmol/L   Glucose, Bld 153 (H) 70 - 99 mg/dL    Comment: Glucose reference range applies only to samples taken after fasting for at least 8 hours.   BUN 19 8 - 23 mg/dL   Creatinine, Ser 4.09 (H) 0.61 - 1.24 mg/dL   Calcium 81.1 8.9 - 91.4 mg/dL   Total Protein 6.9 6.5 - 8.1 g/dL   Albumin 3.6 3.5 - 5.0 g/dL   AST 29 15 - 41 U/L   ALT 25 0 - 44 U/L   Alkaline Phosphatase 66 38 - 126 U/L   Total Bilirubin 1.0 0.3 - 1.2 mg/dL   GFR, Estimated 42 (L) >60 mL/min    Comment: (NOTE) Calculated using the CKD-EPI Creatinine Equation (2021)    Anion gap 11 5 - 15    Comment: Performed at [facility], [address] [place], [place] [address]  I-stat chem 8, ED     Status: Abnormal   Collection Time: 05/13/22 10:01 PM  Result Value Ref Range   Sodium 125 (L) 135 - 145 mmol/L   Potassium 4.1 3.5 - 5.1 mmol/L   Chloride 92 (L) 98 - 111 mmol/L   BUN 19 8 - 23 mg/dL   Creatinine, Ser 6.21 (H) 0.61 - 1.24 mg/dL   Glucose, Bld 308 (H) 70 - 99 mg/dL    Comment: Glucose reference range applies only to samples taken after fasting for at least 8 hours.   Calcium, Ion 1.20 1.15 - 1.40 mmol/L   TCO2 22 22 - 32 mmol/L   Hemoglobin 16.7 13.0 - 17.0 g/dL   HCT 65.7 84.6 - 96.2 %  CBG monitoring, ED     Status: Abnormal   Collection Time: 05/14/22  3:54 AM  Result Value Ref Range   Glucose-Capillary 135 (H) 70 - 99 mg/dL    Comment: Glucose reference range applies only to samples taken after fasting for at least 8 hours.  Sodium, urine, random     Status: None   Collection Time: 05/14/22  4:00 AM  Result Value Ref Range   Sodium, Ur 124 mmol/L    Comment: Performed at [facility], [address] [place], [place] [address]  Lipid panel     Status: None   Collection Time: 05/14/22  4:40 AM  Result Value Ref Range   Cholesterol 117 0 - 200 mg/dL   Triglycerides 85 <132 mg/dL   HDL 46 >44 mg/dL   Total CHOL/HDL Ratio 2.5 RATIO   VLDL 17 0 - 40 mg/dL   LDL Cholesterol 54 0 - 99 mg/dL    Comment:        Total Cholesterol/HDL:CHD Risk Coronary Heart Disease Risk Table                     Men   Women  1/2 Average Risk   3.4   3.3  Average Risk       5.0   4.4  2 X Average Risk   9.6   7.1  3 X Average Risk  23.4   11.0        Use the calculated Patient Ratio above and the CHD Risk Table to determine the patient's CHD Risk.        ATP III CLASSIFICATION (LDL):  <100  mg/dL   Optimal  100-129  mg/dL   Near or Above                    Optimal  130-159  mg/dL   Borderline  160-189  mg/dL   High  >190     mg/dL   Very High Performed at [facility] [address] [place], [place] [address]   Comprehensive metabolic panel     Status: Abnormal   Collection Time: 05/14/22  4:40 AM  Result Value Ref Range   Sodium 128 (L) 135 - 145 mmol/L   Potassium 4.2 3.5 - 5.1 mmol/L   Chloride 94 (L) 98 - 111 mmol/L   CO2 24 22 - 32 mmol/L   Glucose, Bld 129 (H) 70 - 99 mg/dL    Comment: Glucose reference range applies only to samples taken after fasting for at least 8 hours.   BUN 16 8 - 23 mg/dL   Creatinine, Ser 1.49 (H) 0.61 - 1.24 mg/dL   Calcium 9.8 8.9 - 10.3 mg/dL   Total Protein 6.2 (L) 6.5 - 8.1 g/dL   Albumin 3.3 (L) 3.5 - 5.0 g/dL   AST 22 15 - 41 U/L   ALT 21 0 - 44 U/L   Alkaline Phosphatase 65 38 - 126 U/L   Total Bilirubin 1.0 0.3 - 1.2 mg/dL   GFR, Estimated 45 (L) >60 mL/min    Comment: (NOTE) Calculated using the CKD-EPI Creatinine Equation (2021)    Anion gap 10 5 - 15    Comment: Performed at [facility], [place] [address] [place], [place] [address]  CBC     Status: Abnormal   Collection Time: 05/14/22  4:40 AM   Result Value Ref Range   WBC 11.0 (H) 4.0 - 10.5 K/uL   RBC 5.27 4.22 - 5.81 MIL/uL   Hemoglobin 15.4 13.0 - 17.0 g/dL   HCT 44.6 39.0 - 52.0 %   MCV 84.6 80.0 - 100.0 fL   MCH 29.2 26.0 - 34.0 pg   MCHC 34.5 30.0 - 36.0 g/dL   RDW 13.2 11.5 - 15.5 %   Platelets 261 150 - 400 K/uL   nRBC 0.0 0.0 - 0.2 %    Comment: Performed at [facility], [place] [address], [place], [place] [address]  TSH     Status: None   Collection Time: 05/14/22  4:40 AM  Result Value Ref Range   TSH 3.717 0.350 - 4.500 uIU/mL    Comment: Performed by a 3rd Generation assay with a functional sensitivity of <=0.01 uIU/mL. Performed at [facility], [place] [address] [place], [place] [address]   Cortisol     Status: None   Collection Time: 05/14/22  4:40 AM  Result Value Ref Range   Cortisol, Plasma 20.2 ug/dL    Comment: (NOTE) AM    6.7 - 22.6 ug/dL PM   <10.0       ug/dL Performed at [facility] [address] [place], [place] [address]   Vitamin B12     Status: Abnormal   Collection Time: 05/14/22  4:40 AM  Result Value Ref Range   Vitamin B-12 162 (L) 180 - 914 pg/mL    Comment: (NOTE) This assay is not validated for testing neonatal or myeloproliferative syndrome specimens for Vitamin B12 levels. Performed at [facility], [place] [address] [place], [place] [address]   Hemoglobin A1c  Status: None   Collection Time: 05/14/22  4:40 AM  Result Value Ref Range   Hgb A1c MFr Bld 4.9 4.8 - 5.6 %    Comment: (NOTE) Pre diabetes:          5.7%-6.4%  Diabetes:              >6.4%  Glycemic control for   <7.0% adults with diabetes    Mean Plasma Glucose 93.93 mg/dL    Comment: Performed at [facility], [address] [place], [place] [address]  CBG monitoring, ED     Status: Abnormal   Collection Time: 05/14/22  8:08 AM  Result Value Ref Range   Glucose-Capillary 126 (H) 70 - 99 mg/dL    Comment: Glucose reference range applies only to samples taken after  fasting for at least 8 hours.  Ammonia     Status: None   Collection Time: 05/14/22 11:01 AM  Result Value Ref Range   Ammonia 25 9 - 35 umol/L    Comment: Performed at [facility], [address] [place], [place] [address]  CBG monitoring, ED     Status: Abnormal   Collection Time: 05/14/22 12:27 PM  Result Value Ref Range   Glucose-Capillary 117 (H) 70 - 99 mg/dL    Comment: Glucose reference range applies only to samples taken after fasting for at least 8 hours.   Comment 1 Notify RN    Comment 2 Document in Chart    ECHOCARDIOGRAM COMPLETE  Result Date: 05/14/2022    ECHOCARDIOGRAM REPORT   Patient Name:   Johnny [name] Date of Exam: 05/14/2022 Medical Rec #:  [id number]       Height:       67.0 in Accession #:    [id number]      Weight:       183.0 lb Date of Birth:  May 07, 1933       BSA:          1.947 m Patient Age:    89 years        BP:           188/76 mmHg Patient Gender: M               HR:           63 bpm. Exam Location:  Inpatient Procedure: 2D Echo, Color Doppler and Cardiac Doppler Indications:    Stroke  History:        Patient has prior history of Echocardiogram examinations, most                 recent 04/27/2017. CAD, Stroke, Signs/Symptoms:Murmur; Risk                 Factors:Hypertension and Diabetes.                 Aortic Valve: bioprosthetic valve is present in the aortic                 position.  Sonographer:    Gaynell Face Referring Phys: 52 ARSHAD N KAKRAKANDY IMPRESSIONS  1. Left ventricular ejection fraction, by estimation, is 60 to 65%. The left ventricle has normal function. The left ventricle has no regional wall motion abnormalities. There is mild concentric left ventricular hypertrophy. Left ventricular diastolic parameters are consistent with Grade II diastolic dysfunction (pseudonormalization). Elevated left atrial pressure.  2. Right ventricular systolic function is normal. The right ventricular size is normal. Tricuspid regurgitation signal  is  inadequate for assessing PA pressure.  3. Left atrial size was mildly dilated.  4. The mitral valve is normal in structure. Trivial mitral valve regurgitation. No evidence of mitral stenosis.  5. The aortic valve has been repaired/replaced. Aortic valve regurgitation is not visualized. No aortic stenosis is present. There is a bioprosthetic valve present in the aortic position.  6. The inferior vena cava is normal in size with greater than 50% respiratory variability, suggesting right atrial pressure of 3 mmHg. FINDINGS  Left Ventricle: Left ventricular ejection fraction, by estimation, is 60 to 65%. The left ventricle has normal function. The left ventricle has no regional wall motion abnormalities. The left ventricular internal cavity size was normal in size. There is  mild concentric left ventricular hypertrophy. Left ventricular diastolic parameters are consistent with Grade II diastolic dysfunction (pseudonormalization). Elevated left atrial pressure. Right Ventricle: The right ventricular size is normal. Right ventricular systolic function is normal. Tricuspid regurgitation signal is inadequate for assessing PA pressure. The tricuspid regurgitant velocity is 1.87 m/s, and with an assumed right atrial  pressure of 3 mmHg, the estimated right ventricular systolic pressure is 17.0 mmHg. Left Atrium: Left atrial size was mildly dilated. Right Atrium: Right atrial size was normal in size. Pericardium: There is no evidence of pericardial effusion. Mitral Valve: The mitral valve is normal in structure. Mild mitral annular calcification. Trivial mitral valve regurgitation. No evidence of mitral valve stenosis. Tricuspid Valve: The tricuspid valve is normal in structure. Tricuspid valve regurgitation is trivial. No evidence of tricuspid stenosis. Aortic Valve: The aortic valve has been repaired/replaced. Aortic valve regurgitation is not visualized. No aortic stenosis is present. Aortic valve mean gradient measures 8.0  mmHg. Aortic valve peak gradient measures 15.8 mmHg. Aortic valve area, by VTI  measures 1.56 cm. There is a bioprosthetic valve present in the aortic position. Pulmonic Valve: The pulmonic valve was normal in structure. Pulmonic valve regurgitation is not visualized. No evidence of pulmonic stenosis. Aorta: The aortic root is normal in size and structure. Venous: The inferior vena cava is normal in size with greater than 50% respiratory variability, suggesting right atrial pressure of 3 mmHg. IAS/Shunts: No atrial level shunt detected by color flow Doppler.  LEFT VENTRICLE PLAX 2D LVIDd:         4.50 cm   Diastology LVIDs:         3.10 cm   LV e' medial:    6.06 cm/s LV PW:         1.20 cm   LV E/e' medial:  16.5 LV IVS:        1.20 cm   LV e' lateral:   8.24 cm/s LVOT diam:     2.10 cm   LV E/e' lateral: 12.1 LV SV:         65 LV SV Index:   34 LVOT Area:     3.46 cm  RIGHT VENTRICLE TAPSE (M-mode): 1.7 cm LEFT ATRIUM             Index        RIGHT ATRIUM           Index LA Vol (A2C):   50.7 ml 26.04 ml/m  RA Area:     12.10 cm LA Vol (A4C):   65.5 ml 33.64 ml/m  RA Volume:   26.30 ml  13.51 ml/m LA Biplane Vol: 59.3 ml 30.45 ml/m  AORTIC VALVE AV Area (Vmax):    1.17 cm AV Area (Vmean):  1.16 cm AV Area (VTI):     1.56 cm AV Vmax:           199.00 cm/s AV Vmean:          133.000 cm/s AV VTI:            0.419 m AV Peak Grad:      15.8 mmHg AV Mean Grad:      8.0 mmHg LVOT Vmax:         67.40 cm/s LVOT Vmean:        44.500 cm/s LVOT VTI:          0.189 m LVOT/AV VTI ratio: 0.45  AORTA Ao Root diam: 2.50 cm Ao Asc diam:  2.90 cm MITRAL VALVE                TRICUSPID VALVE MV Area (PHT): 4.39 cm     TR Peak grad:   14.0 mmHg MV Decel Time: 173 msec     TR Vmax:        187.00 cm/s MV E velocity: 100.00 cm/s MV A velocity: 93.10 cm/s   SHUNTS MV E/A ratio:  1.07         Systemic VTI:  0.19 m                             Systemic Diam: 2.10 cm Olga Millers MD Electronically signed by Olga Millers MD  Signature Date/Time: 05/14/2022/1:36:46 PM    Final    VAS US CAROTID  Result Date: 05/14/2022 Carotid Arterial Duplex Study Patient Name:  Johnny [name]  Date of Exam:   05/14/2022 Medical Rec #: [id number]        Accession #:    [id number] Date of Birth: [birth year]        Patient Gender: M Patient Age:   89 years Exam Location:  [facility] Procedure:      VAS US CAROTID Referring Phys: Midge Minium --------------------------------------------------------------------------------  Indications:   CVA. Risk Factors:  Hypertension, Diabetes, past history of smoking, coronary artery                disease, prior CVA. Other Factors: S/P AVR. Performing Technologist: Jean Rosenthal RDMS, RVT  Examination Guidelines: A complete evaluation includes B-mode imaging, spectral Doppler, color Doppler, and power Doppler as needed of all accessible portions of each vessel. Bilateral testing is considered an integral part of a complete examination. Limited examinations for reoccurring indications may be performed as noted.  Right Carotid Findings: +----------+--------+--------+--------+------------------+--------+           PSV cm/sEDV cm/sStenosisPlaque DescriptionComments +----------+--------+--------+--------+------------------+--------+ CCA Prox  63      11                                         +----------+--------+--------+--------+------------------+--------+ CCA Distal52      8                                          +----------+--------+--------+--------+------------------+--------+ ICA Prox  45      10      1-39%   heterogenous               +----------+--------+--------+--------+------------------+--------+ ICA Mid   41  9                                          +----------+--------+--------+--------+------------------+--------+ ICA Distal38      9                                          +----------+--------+--------+--------+------------------+--------+  ECA       91      9                                          +----------+--------+--------+--------+------------------+--------+ +----------+--------+-------+----------------+-------------------+           PSV cm/sEDV cmsDescribe        Arm Pressure (mmHG) +----------+--------+-------+----------------+-------------------+ [id number]             Multiphasic, WNL                    +----------+--------+-------+----------------+-------------------+ +---------+--------+--+--------+-+---------+ VertebralPSV cm/s51EDV cm/s8Antegrade +---------+--------+--+--------+-+---------+  Left Carotid Findings: +----------+--------+--------+--------+------------------+--------+           PSV cm/sEDV cm/sStenosisPlaque DescriptionComments +----------+--------+--------+--------+------------------+--------+ CCA Prox  56      7                                          +----------+--------+--------+--------+------------------+--------+ CCA Distal58      12              heterogenous               +----------+--------+--------+--------+------------------+--------+ ICA Prox  67      11      1-39%   heterogenous               +----------+--------+--------+--------+------------------+--------+ ICA Mid   62      12                                         +----------+--------+--------+--------+------------------+--------+ ICA Distal38      10                                         +----------+--------+--------+--------+------------------+--------+ ECA       155     14                                         +----------+--------+--------+--------+------------------+--------+ +----------+--------+--------+----------------+-------------------+           PSV cm/sEDV cm/sDescribe        Arm Pressure (mmHG) +----------+--------+--------+----------------+-------------------+ [id number]              Multiphasic, WNL                     +----------+--------+--------+----------------+-------------------+ +---------+--------+--+--------+-+---------+ VertebralPSV cm/s35EDV cm/s8Antegrade +---------+--------+--+--------+-+---------+   Summary: Right Carotid: Velocities in the right ICA are consistent with a 1-39% stenosis. Left Carotid: Velocities in  the left ICA are consistent with a 1-39% stenosis. Vertebrals:  Bilateral vertebral arteries demonstrate antegrade flow. Subclavians: Normal flow hemodynamics were seen in bilateral subclavian              arteries. *See table(s) above for measurements and observations.     Preliminary    DG CHEST PORT 1 VIEW  Result Date: 05/14/2022 CLINICAL DATA:  Leukocytosis EXAM: PORTABLE CHEST 1 VIEW COMPARISON:  04/30/2017 FINDINGS: Chronic cardiac enlargement. Prior CABG and aortic valve repair. Diffuse interstitial prominence, likely vascular congestion. Asymmetric indistinct density at the medial right base could be pneumonia. No effusion or pneumothorax. IMPRESSION: 1. Cardiomegaly and vascular congestion. 2. Cannot exclude pneumonia at the medial right base. Electronically Signed   By: Tiburcio Pea M.D.   On: 05/14/2022 05:36   MR BRAIN WO CONTRAST  Result Date: 05/14/2022 CLINICAL DATA:  Follow-up examination for neuro deficit, stroke suspected. EXAM: MRI HEAD WITHOUT CONTRAST MRA HEAD WITHOUT CONTRAST TECHNIQUE: Multiplanar, multi-echo pulse sequences of the brain and surrounding structures were acquired without intravenous contrast. Angiographic images of the Circle of Willis were acquired using MRA technique without intravenous contrast. COMPARISON:  Prior CT from 05/13/2022. FINDINGS: MRI HEAD FINDINGS Brain: Diffuse prominence of the CSF containing spaces compatible generalized cerebral atrophy. Patchy and confluent T2/FLAIR hyperintensity involving the periventricular and deep white matter both cerebral hemispheres, consistent with chronic small vessel ischemic disease, mild to moderate  in nature. Few scatter remote lacunar infarcts present about the hemispheric cerebral white matter, right basal ganglia, and pons. Few small remote bilateral cerebellar infarcts, right slightly worse than left. No abnormal foci of restricted diffusion to suggest acute or subacute ischemia. Gray-white matter differentiation maintained. No other areas of chronic cortical infarction. Single chronic microhemorrhage noted within the right cerebellum. No other significant acute or chronic intracranial blood products. No mass lesion, midline shift or mass effect. No hydrocephalus or extra-axial fluid collection. Pituitary gland and suprasellar region within normal limits. Vascular: Major intracranial vascular flow voids are maintained. Vertebrobasilar system is somewhat tortuous, partially indenting the medulla. Skull and upper cervical spine: Craniocervical junction within normal limits. Bone marrow signal intensity normal. No scalp soft tissue abnormality. Sinuses/Orbits: Irregular signal abnormality at the posterolateral right globe, again suggestive of possible vitreous hemorrhage (series 19, image 9). Paranasal sinuses are largely clear. No significant mastoid effusion. Other: None. MRA HEAD FINDINGS Anterior circulation: Both internal carotid arteries patent to the termini without stenosis or other abnormality. A1 segments patent bilaterally. Normal anterior communicating artery complex. Atheromatous irregularity within both ACAs without proximal high-grade stenosis. No M1 stenosis or occlusion. Moderate to severe multifocal bilateral M2 stenoses noted (series 1054, images 3, 11). Extensive small vessel atheromatous irregularity seen distally throughout the MCA branches. Posterior circulation: Both vertebral arteries patent without stenosis. Left PICA patent at its origin. Right PICA not well seen. Basilar patent to its distal aspect without stenosis. Superior cerebellar arteries patent bilaterally. Both PCAs  primarily supplied via the basilar. Moderate multifocal right P2 stenoses (series 1036, image 13). Distal small vessel atheromatous irregularity noted within both PCAs. Anatomic variants: None significant.  No aneurysm. IMPRESSION: MRI HEAD IMPRESSION: 1. No acute intracranial abnormality. 2. Generalized cerebral atrophy with chronic microvascular ischemic disease, with a few scattered remote lacunar infarcts involving the hemispheric cerebral white matter, right basal ganglia, and pons. 3. Few small remote bilateral cerebellar infarcts, right worse than left. MRA HEAD IMPRESSION: 1. Negative intracranial MRA for large vessel occlusion. 2. Intracranial atherosclerotic disease with associated moderate to severe  multifocal bilateral M2 and right P2 stenoses. Extensive distal small vessel atheromatous irregularity. Electronically Signed   By: Rise Mu M.D.   On: 05/14/2022 03:56   MR ANGIO HEAD WO CONTRAST  Result Date: 05/14/2022 CLINICAL DATA:  Follow-up examination for neuro deficit, stroke suspected. EXAM: MRI HEAD WITHOUT CONTRAST MRA HEAD WITHOUT CONTRAST TECHNIQUE: Multiplanar, multi-echo pulse sequences of the brain and surrounding structures were acquired without intravenous contrast. Angiographic images of the Circle of Willis were acquired using MRA technique without intravenous contrast. COMPARISON:  Prior CT from 05/13/2022. FINDINGS: MRI HEAD FINDINGS Brain: Diffuse prominence of the CSF containing spaces compatible generalized cerebral atrophy. Patchy and confluent T2/FLAIR hyperintensity involving the periventricular and deep white matter both cerebral hemispheres, consistent with chronic small vessel ischemic disease, mild to moderate in nature. Few scatter remote lacunar infarcts present about the hemispheric cerebral white matter, right basal ganglia, and pons. Few small remote bilateral cerebellar infarcts, right slightly worse than left. No abnormal foci of restricted diffusion to  suggest acute or subacute ischemia. Gray-white matter differentiation maintained. No other areas of chronic cortical infarction. Single chronic microhemorrhage noted within the right cerebellum. No other significant acute or chronic intracranial blood products. No mass lesion, midline shift or mass effect. No hydrocephalus or extra-axial fluid collection. Pituitary gland and suprasellar region within normal limits. Vascular: Major intracranial vascular flow voids are maintained. Vertebrobasilar system is somewhat tortuous, partially indenting the medulla. Skull and upper cervical spine: Craniocervical junction within normal limits. Bone marrow signal intensity normal. No scalp soft tissue abnormality. Sinuses/Orbits: Irregular signal abnormality at the posterolateral right globe, again suggestive of possible vitreous hemorrhage (series 19, image 9). Paranasal sinuses are largely clear. No significant mastoid effusion. Other: None. MRA HEAD FINDINGS Anterior circulation: Both internal carotid arteries patent to the termini without stenosis or other abnormality. A1 segments patent bilaterally. Normal anterior communicating artery complex. Atheromatous irregularity within both ACAs without proximal high-grade stenosis. No M1 stenosis or occlusion. Moderate to severe multifocal bilateral M2 stenoses noted (series 1054, images 3, 11). Extensive small vessel atheromatous irregularity seen distally throughout the MCA branches. Posterior circulation: Both vertebral arteries patent without stenosis. Left PICA patent at its origin. Right PICA not well seen. Basilar patent to its distal aspect without stenosis. Superior cerebellar arteries patent bilaterally. Both PCAs primarily supplied via the basilar. Moderate multifocal right P2 stenoses (series 1036, image 13). Distal small vessel atheromatous irregularity noted within both PCAs. Anatomic variants: None significant.  No aneurysm. IMPRESSION: MRI HEAD IMPRESSION: 1. No  acute intracranial abnormality. 2. Generalized cerebral atrophy with chronic microvascular ischemic disease, with a few scattered remote lacunar infarcts involving the hemispheric cerebral white matter, right basal ganglia, and pons. 3. Few small remote bilateral cerebellar infarcts, right worse than left. MRA HEAD IMPRESSION: 1. Negative intracranial MRA for large vessel occlusion. 2. Intracranial atherosclerotic disease with associated moderate to severe multifocal bilateral M2 and right P2 stenoses. Extensive distal small vessel atheromatous irregularity. Electronically Signed   By: Rise Mu M.D.   On: 05/14/2022 03:56   DG Foot 2 Views Right  Result Date: 05/14/2022 CLINICAL DATA:  [id number]. Osteomyelitis. Last surgery was 2 months ago. EXAM: RIGHT FOOT - 2 VIEW COMPARISON:  Right foot series 07/21/2021. FINDINGS: AP Lat only. Again noted is prior amputation of the right great toe. There are hammertoe deformities of the second through fifth toes. Due to poor positioning and overlapping structures especially on the AP, evaluation is limited. There is no appreciable destructive bone lesion, but the  base of the metatarsals are not well seen and the toes are not well seen either. There is profound osteopenia. Midfoot arthrosis is noted and a small plantar calcaneal enthesophyte with spurring at the tibiotalar joint. There is diffuse soft tissue edema. No soft tissue gas or radiopaque foreign body are seen. IMPRESSION: 1. Limited study due to poor positioning and overlapping structures. No appreciable destructive bone lesion. 2. Diffuse soft tissue edema. 3. Previous amputation of the great toe. 4. Osteopenia and degenerative change. Electronically Signed   By: Almira Bar M.D.   On: 05/14/2022 03:52   CT HEAD CODE STROKE WO CONTRAST  Result Date: 05/13/2022 CLINICAL DATA:  Code stroke. Initial evaluation for neuro deficit, stroke suspected. EXAM: CT HEAD WITHOUT CONTRAST TECHNIQUE: Contiguous  axial images were obtained from the base of the skull through the vertex without intravenous contrast. RADIATION DOSE REDUCTION: This exam was performed according to the departmental dose-optimization program which includes automated exposure control, adjustment of the mA and/or kV according to patient size and/or use of iterative reconstruction technique. COMPARISON:  Prior study from 03/10/2021. FINDINGS: Brain: Moderately advanced cerebral atrophy with mild chronic small vessel ischemic disease. Small remote right cerebellar infarct. No acute intracranial hemorrhage. No acute large vessel territory infarct. No mass lesion or midline shift. No hydrocephalus or extra-axial fluid collection. Vascular: No abnormal hyperdense vessel. Calcified atherosclerosis present at the skull base. Skull: Scalp soft tissues and calvarium demonstrate no acute finding. Sinuses/Orbits: Abnormal hyperdensity seen involving the posterior aspect of the right globe, suggestive of possible hemorrhage (series 2, image 9). Postoperative changes noted about the contralateral left globe. Paranasal sinuses are clear. Other: No mastoid effusion. ASPECTS [facility] Stroke Program Early CT Score) - Ganglionic level infarction (caudate, lentiform nuclei, internal capsule, insula, M1-M3 cortex): 7 - Supraganglionic infarction (M4-M6 cortex): 3 Total score (0-10 with 10 being normal): 10 IMPRESSION: 1. No acute intracranial abnormality. 2. ASPECTS is 10. 3. Hyperdensity involving the posterior aspect of the right globe, suggesting vitreous hemorrhage. Correlation with physical exam recommended. 4. Atrophy with chronic microvascular ischemic disease. Small remote right cerebellar infarct. These results were communicated to Dr. Wilford Corner at 10:06 pm on 05/13/2022 by text page via the [facility] messaging system. Electronically Signed   By: Rise Mu M.D.   On: 05/13/2022 22:08    Pending Labs Unresulted Labs (From admission, onward)     Start      Ordered   05/15/22 0500  Basic metabolic panel  Tomorrow morning,   R        05/14/22 1141   05/14/22 0257  Osmolality, urine  ONCE - URGENT,   URGENT        05/14/22 0256   05/13/22 2154  Urine rapid drug screen (hosp performed)  Once,   STAT        05/13/22 2153   05/13/22 2154  Urinalysis, Routine w reflex microscopic Urine, Catheterized  Once,   URGENT        05/13/22 2153            Vitals/Pain Today's Vitals   05/14/22 1338 05/14/22 1400 05/14/22 1430 05/14/22 1500  BP:  (!) 185/78 (!) 193/80 (!) 183/80  Pulse:  61 64 61  Resp:  Temp:      TempSrc:      SpO2:  94% 95% 93%  Weight: 83 kg     Height:  (1.702 m)     PainSc:        Isolation  Precautions No active isolations  Medications Medications   stroke: early stages of recovery book (has no administration in time range)  0.9 %  sodium chloride infusion ( Intravenous Rate/Dose Verify 05/14/22 1330)  acetaminophen (TYLENOL) tablet 650 mg (has no administration in time range)    Or  acetaminophen (TYLENOL) 160 MG/5ML solution 650 mg (has no administration in time range)    Or  acetaminophen (TYLENOL) suppository 650 mg (has no administration in time range)  aspirin suppository 300 mg (300 mg Rectal Given 05/14/22 1024)    Or  aspirin tablet 325 mg ( Oral See Alternative 05/14/22 1024)  insulin aspart (novoLOG) injection 0-6 Units ( Subcutaneous Not Given 05/14/22 1231)  fondaparinux (ARIXTRA) injection 2.5 mg (2.5 mg Subcutaneous Given 05/14/22 1031)  hydrALAZINE (APRESOLINE) injection 10 mg (10 mg Intravenous Given 05/14/22 0829)  sodium chloride 0.9 % bolus 500 mL (0 mLs Intravenous Stopped 05/14/22 0242)  cyanocobalamin (VITAMIN B12) injection 1,000 mcg (1,000 mcg Intramuscular Given 05/14/22 1332)    Mobility walks with device Moderate fall risk   Focused Assessments Cardiac Assessment Handoff:  Cardiac Rhythm: Normal sinus rhythm Lab Results  Component Value Date   CKTOTAL 211 01/03/2014    No results found for: "DDIMER" Does the Patient currently have chest pain? No   , Neuro Assessment Handoff:  Swallow screen pass? No  Cardiac Rhythm: Normal sinus rhythm NIH Stroke Scale ( + Modified Stroke Scale Criteria)  Interval: Initial Level of Consciousness (1a.)   : Alert, keenly responsive LOC Questions (1b. )   +: Answers both questions correctly LOC Commands (1c. )   + : Performs both tasks correctly Best Gaze (2. )  +: Normal Visual (3. )  +: Bilateral hemianopia (blind including cortical blindness) (Patient is legally blind) Facial Palsy (4. )    : Normal symmetrical movements Motor Arm, Left (5a. )   +: No drift Motor Arm, Right (5b. )   +: No drift Motor Leg, Left (6a. )   +: Drift Motor Leg, Right (6b. )   +: No drift Limb Ataxia (7. ): Absent Sensory (8. )   +: Normal, no sensory loss Best Language (9. )   +: No aphasia Dysarthria (10. ): Mild-to-moderate dysarthria, patient slurs at least some words and, at worst, can be understood with some difficulty Extinction/Inattention (11.)   +: No Abnormality Modified SS Total  +: 4 Complete NIHSS TOTAL: 5 Last date known well: 05/13/22 Last time known well: 1930 Neuro Assessment: Exceptions to WDL Neuro Checks:   Initial (05/13/22 2200)  Last Documented NIHSS Modified Score: 4 (05/14/22 1300) Has TPA been given? No If patient is a Neuro Trauma and patient is going to OR before floor call report to 4N Charge nurse: [phone] or [phone]  , Pulmonary Assessment Handoff:  Lung sounds:          R Recommendations: See Admitting Provider Note  Report given to:   Additional Notes:

## 2022-05-14 NOTE — ED Notes (Signed)
Dr. Hal Hope notified that pt unable to pass swallow screen at this time.

## 2022-05-14 NOTE — Code Documentation (Signed)
Responded to Code Stroke called at 2144 for slurred speech, LSN>24 hours. Pt arrived at 2151, CBG-168, NIH-5, CT head negative for acute changes. TNK not given-outside window. Code Stroke cancelled at 2232. Plan EEG/MRI/stroke/TIA workup.

## 2022-05-14 NOTE — Progress Notes (Signed)
New admit received from ED.  Oriented to room and surroundings.  Patient handbook given detailing rights, responsibilities, and visitor guidelines.

## 2022-05-14 NOTE — Evaluation (Signed)
Occupational Therapy Evaluation Patient Details Name: Johnny [name] MRN: [id number] DOB: February 05, 1933 Today's Date: 05/14/2022   History of Present Illness Pt is an 88 y/o male who presented with jerking spells, weakness and slurred speech. MRI brain negative for acute infarct. PMHx:  strokes, DM, HTN, aortic stenosis, valve replacement, macular degeneration with full vision loss, CABG, gout, dyslipidemia, CKD3b, R toe amputation 02/2022   Clinical Impression   PTA, pt lives with spouse, receives light assist for bathing/dressing and supervision for ambulation with Rollator due to pt low vision. Pt presents now with deficits in standing balance, strength and endurance. With directional cues due to low vision, pt able to complete bed mobility with Min-Mod A and take steps using RW with Min A. Noted posterior bias in standing and difficulty lifting feet placing pt at increased risk for falls. Pt requires Mod A for UB ADL and Max A for LB ADLs at this time. Based on current presentation, recommend SNF rehab at [place]. However, pt has potential to improve to home with continued mobility and if adequate physical assistance available. Will continue to follow acutely.      Recommendations for follow up therapy are one component of a multi-disciplinary discharge planning process, led by the attending physician.  Recommendations may be updated based on patient status, additional functional criteria and insurance authorization.   Follow Up Recommendations  Skilled nursing-short term rehab (<3 hours/day)    Assistance Recommended at Discharge Frequent or constant Supervision/Assistance  Patient can return home with the following A lot of help with walking and/or transfers;A lot of help with bathing/dressing/bathroom    Functional Status Assessment  Patient has had a recent decline in their functional status and demonstrates the ability to make significant improvements in function in a reasonable and  predictable amount of time.  Equipment Recommendations  None recommended by OT    Recommendations for Other Services       Precautions / Restrictions Precautions Precautions: Fall;Other (comment) Precaution Comments: low vision d/t macular degeneration Restrictions Weight Bearing Restrictions: No      Mobility Bed Mobility Overal bed mobility: Needs Assistance Bed Mobility: Supine to Sit, Sit to Supine     Supine to sit: Min assist, HOB elevated Sit to supine: Mod assist   General bed mobility comments: Min A to lift trunk, Mod A for BLE back to bed    Transfers Overall transfer level: Needs assistance Equipment used: Rolling walker (2 wheels) Transfers: Sit to/from Stand Sit to Stand: Min assist           General transfer comment: steadying assist with posterior bias      Balance Overall balance assessment: Needs assistance Sitting-balance support: No upper extremity supported, Feet supported Sitting balance-Leahy Scale: Fair     Standing balance support: Bilateral upper extremity supported, During functional activity Standing balance-Leahy Scale: Poor                             ADL either performed or assessed with clinical judgement   ADL Overall ADL's : Needs assistance/impaired Eating/Feeding: Set up;Sitting   Grooming: Minimal assistance;Standing   Upper Body Bathing: Moderate assistance;Sitting   Lower Body Bathing: Maximal assistance;Sit to/from stand   Upper Body Dressing : Moderate assistance;Sitting   Lower Body Dressing: Maximal assistance;Sit to/from stand   Toilet Transfer: Minimal assistance;Rolling walker (2 wheels);Ambulation   Toileting- Clothing Manipulation and Hygiene: Sitting/lateral lean;Sit to/from stand;Moderate assistance  General ADL Comments: Unsteady in standing with posterior bias and difficulty lifting feet to take steps     Vision Baseline Vision/History: 2 Legally blind;6 Macular  Degeneration Ability to See in Adequate Light: 4 Severely impaired Patient Visual Report: No change from baseline Vision Assessment?: Vision impaired- to be further tested in functional context Additional Comments: macular degeneration, reports unable to see shadows or lights     Perception     Praxis      Pertinent Vitals/Pain Pain Assessment Pain Assessment: No/denies pain     Hand Dominance Right   Extremity/Trunk Assessment Upper Extremity Assessment Upper Extremity Assessment: Generalized weakness   Lower Extremity Assessment Lower Extremity Assessment: Defer to PT evaluation   Cervical / Trunk Assessment Cervical / Trunk Assessment: Normal   Communication Communication Communication: No difficulties   Cognition Arousal/Alertness: Awake/alert Behavior During Therapy: WFL for tasks assessed/performed Overall Cognitive Status: Within Functional Limits for tasks assessed                                 General Comments: very pleasant, oriented     General Comments       Exercises     Shoulder Instructions      Home Living Family/patient expects to be discharged to:: Private residence Living Arrangements: Spouse/significant other Available Help at Discharge: Family;Available 24 hours/day Type of Home: Other(Comment) (townhouse) Home Access: Stairs to enter [employer] of Steps: 1   Home Layout: One level     Bathroom Shower/Tub: Teacher, early years/pre: Standard     Home Equipment: Rollator (4 wheels);Shower seat          Prior Functioning/Environment Prior Level of Function : Needs assist             Mobility Comments: reports use of walker for mobility and that wife walks with pt everywhere to assist due to low vision ADLs Comments: reports wife assists with bathing, dressing due to low vision        OT Problem List: Decreased strength;Impaired balance (sitting and/or standing);Decreased activity  tolerance;Impaired vision/perception      OT Treatment/Interventions: Self-care/ADL training;Therapeutic exercise;Energy conservation;DME and/or AE instruction;Therapeutic activities;Patient/family education    OT Goals(Current goals can be found in the care plan section) Acute Rehab OT Goals Patient Stated Goal: agreeable with SNF vs HH pending steadiness OT Goal Formulation: With patient/family Time For Goal Achievement: 05/28/22 Potential to Achieve Goals: Good  OT Frequency: Min 2X/week    Co-evaluation              AM-PAC OT "6 Clicks" Daily Activity     Outcome Measure Help from another person eating meals?: A Little Help from another person taking care of personal grooming?: A Little Help from another person toileting, which includes using toliet, bedpan, or urinal?: A Lot Help from another person bathing (including washing, rinsing, drying)?: A Lot Help from another person to put on and taking off regular upper body clothing?: A Lot Help from another person to put on and taking off regular lower body clothing?: A Lot 6 Click Score: 14   End of Session Equipment Utilized During Treatment: Gait belt;Rolling walker (2 wheels) Nurse Communication: Mobility status  Activity Tolerance: Patient tolerated treatment well Patient left: in bed;with call bell/phone within reach  OT Visit Diagnosis: Unsteadiness on feet (R26.81);Other abnormalities of gait and mobility (R26.89)  Time: 2703-5009 OT Time Calculation (min): 21 min Charges:  OT General Charges $OT Visit: 1 Visit OT Evaluation $OT Eval Low Complexity: 1 Low  Bradd Canary, OTR/L Acute Rehab Services Office: [phone]   Lorre Munroe 05/14/2022, 1:24 PM

## 2022-05-14 NOTE — Progress Notes (Signed)
Carotid duplex bilateral study completed.   Please see CV Proc for preliminary results.   Trae Bovenzi, RDMS, RVT  

## 2022-05-14 NOTE — Telephone Encounter (Signed)
Wife called to let Dr. Amalia Hailey know that Johnny [name] is in the ED and will be admitted. He has an office surgery scheduled with Dr. Amalia Hailey on 05/19/2022. I told her to keep it on the schedule for now and let me know how things are going the first of next week. I told her I would let Dr, Amalia Hailey know that he is in the hospital.

## 2022-05-14 NOTE — Progress Notes (Addendum)
  PROGRESS NOTE  Patient admitted earlier this morning. See H&P.   Johnny [name] is a 88 y.o. male with history of CAD status post CABG, bioprosthetic aortic valve replacement, diabetes mellitus type 2, hypertension, prior stroke, chronic kidney disease stage III, chronic nonhealing ulcer on the second toe of the right foot was found to have increasing slurred speech yesterday and was brought to the ER.  Over the last 3 days patient has been having difficulty swallowing and has not had any much to eat.  Also has been found to have some jerking spells which involves his whole body over the last 2 to 3 months.  Last evening around 7:30 PM patient was noticed to be more weaker than usual was noticed to have slurred speech and was brought to the ER.   In the ER patient was evaluated by neurologist had CT head done.  Patient's labs show hyponatremia and leukocytosis.  Patient admitted for further management for possible stroke.  On exam patient has weakness on the left upper and lower extremity.  Failed stroke swallow.  Neurology was consulted.  Patient seen in the emergency department this morning.  He tells me that he has macular degeneration and is completely blind in both eyes.  Slurring of speech seems to have resolved.  MRI was negative for acute stroke. Wife also monitoring right eye subconjunctival hemorrhage, which is improving per her report.   Carotid ultrasound negative Echocardiogram pending EEG pending Neurology following Continue IV fluid for hyponatremia, improving PT OT SLP Pending Vitamin B12 is low, replace Ammonia level pending  Updated wife over the phone this morning.   Status is: Observation The patient will require care spanning > 2 midnights and should be moved to inpatient because: requiring IVF for hyponatremia. Await further testing results EEG and Echo. PT OT SLP pending.    Dessa Phi, DO [facility] 05/14/2022, 11:30 AM  Available via Epic secure chat  7am-7pm After these hours, please refer to coverage provider listed on [email or web address]

## 2022-05-14 NOTE — Evaluation (Signed)
Clinical/Bedside Swallow Evaluation Patient Details  Name: Johnny [name] MRN: [id number] Date of Birth: 1933-06-22  Today's Date: 05/14/2022 Time: SLP Start Time (ACUTE ONLY): 1523 SLP Stop Time (ACUTE ONLY): 1537 SLP Time Calculation (min) (ACUTE ONLY): 14 min  Past Medical History:  Past Medical History:  Diagnosis Date   Aortic stenosis    BPH (benign prostatic hypertrophy)    Branch retinal vein occlusion 08/01/2013   CAD (coronary artery disease)    CVA (cerebral infarction)    Depression    Diabetes mellitus (HCC)    Diabetic retinopathy (HCC)    Gait disturbance    GERD (gastroesophageal reflux disease)    Gout    Heart murmur    HTN (hypertension)    Macular degeneration    Macular edema    Paresthesia    Pseudophakia of right eye    Ptosis    Stroke [facility])    Tremor    Past Surgical History:  Past Surgical History:  Procedure Laterality Date   AMPUTATION Right 03/07/2021   Procedure: AMPUTATION GREAT TOE;  Surgeon: Felecia Shelling, DPM;  Location: [facility] OR;  Service: Podiatry;  Laterality: Right;   AORTIC VALVE REPLACEMENT  03/26/2013   CATARACT EXTRACTION W/ INTRAOCULAR LENS IMPLANT  11/01/2013   CATARACT EXTRACTION W/ INTRAOCULAR LENS IMPLANT Right 12/18/2009   COLONOSCOPY W/ POLYPECTOMY     CORONARY ARTERY BYPASS GRAFT  03/26/2013   EYE SURGERY  03/19/2016   Ocular tube shunt insertion   PARS PLANA VITRECTOMY  03/19/2016   PARS PLANA VITRECTOMY  11/08/2013   PARS PLANA VITRECTOMY  08/02/2012   PROSTATE SURGERY     TEE WITHOUT CARDIOVERSION N/A 05/02/2017   Procedure: TRANSESOPHAGEAL ECHOCARDIOGRAM (TEE);  Surgeon: Lars Masson, MD;  Location: [facility] ENDOSCOPY;  Service: Cardiovascular;  Laterality: N/A;   HPI:  Pt is an 88 y/o male who presented with jerking spells, weakness and slurred speech. MRI brain negative for acute infarct. PMHx:  strokes, DM, HTN, aortic stenosis, valve replacement, macular degeneration with full vision loss, CABG, gout,  dyslipidemia, CKD3b, R toe amputation 02/2022    Assessment / Plan / Recommendation  Clinical Impression  Johnny [name] participated in a clinical swallowing assessment with his wife at the bedside. Oral mechanism exam was normal.  There were no focal deficits.  He described some difficulty chewing/swallowing dry foods; liquid wash assisted with clearance. Overall, there was no oral residue post-swallow; there were no s/s of aspiration. Recommend he start a dysphagia 3 diet with thin liquids. Meds whole in liquids. D/W pt/wife who agree with recommendations. SLP Visit Diagnosis: Dysphagia, unspecified (R13.10)    Aspiration Risk  No limitations    Diet Recommendation   Dysphagia 3/thin liquids  Medication Administration: Whole meds with liquid    Other  Recommendations Oral Care Recommendations: Oral care BID    Recommendations for follow up therapy are one component of a multi-disciplinary discharge planning process, led by the attending physician.  Recommendations may be updated based on patient status, additional functional criteria and insurance authorization.  Follow up Recommendations No SLP follow up        Swallow Study   General Date of Onset: 05/13/22 HPI: Pt is an 88 y/o male who presented with jerking spells, weakness and slurred speech. MRI brain negative for acute infarct. PMHx:  strokes, DM, HTN, aortic stenosis, valve replacement, macular degeneration with full vision loss, CABG, gout, dyslipidemia, CKD3b, R toe amputation 02/2022 Type of Study: Bedside Swallow Evaluation Previous Swallow Assessment:  no Diet Prior to this Study: NPO Temperature Spikes Noted: No Respiratory Status: Room air History of Recent Intubation: No Behavior/Cognition: Alert;Cooperative Oral Cavity Assessment: Within Functional Limits Oral Care Completed by SLP: No Oral Cavity - Dentition: Adequate natural dentition Vision: Impaired for self-feeding Self-Feeding Abilities: Needs assist Patient  Positioning: Upright in bed Baseline Vocal Quality: Normal Volitional Cough: Strong Volitional Swallow: Able to elicit    Oral/Motor/Sensory Function Overall Oral Motor/Sensory Function: Within functional limits   Ice Chips Ice chips: Within functional limits   Thin Liquid Thin Liquid: Within functional limits    Nectar Thick Nectar Thick Liquid: Not tested   Honey Thick Honey Thick Liquid: Not tested   Puree Puree: Within functional limits   Solid     Solid: Within functional limits      Johnny [name] 05/14/2022,3:58 PM   Johnny Bamberg L. Tivis Ringer, MA CCC/SLP Clinical Specialist - [place] Office number [phone]

## 2022-05-14 NOTE — Progress Notes (Addendum)
STROKE TEAM PROGRESS NOTE   ATTENDING NOTE: I reviewed above note and agree with the assessment and [name]. Pt was seen and examined.   88 year old male with history of hypertension, hyperlipidemia, diabetes, blindness due to macular degeneration, CAD status post CABG, CKD 3, history of stroke in 2018 admitted for intermittent whole body jerking, dysphagia, dysarthria, generalized weakness and elevated BP.  CT no acute abnormality, but concerning for right globe vitreous hemorrhage.  MRI no acute infarct, remote small bilateral cerebellar infarcts right more than left.  MRI showed right P2 and bilateral M2 moderate to severe stenosis.  Carotid Doppler unremarkable, EF 60 to 65%.  LDL 54, A1c 4.9, ammonia level normal.  B12 low at 162.  Creatinine 1.49, sodium 125->128.  WBC 11.0.  CXR cannot exclude pneumonia at the medial right base.  In 04/2017 patient admitted for vertigo.  CT no acute abnormality.  MRI showed multifocal bilateral embolic shower.  MRI intracranial stenosis.  Carotid Doppler negative.  EF 55 to 60%.  LE venous Doppler no DVT.  TEE showed positive PFO.  Loop recorder was not done due to bacteremia/UTI and leukocytosis at that time.  LDL 113, A1c 7.0, he was discharged on aspirin and Lipitor 20.  On exam, patient lying in bed, daughter at bedside.  Patient mildly lethargic, however AAO x 3, no aphasia, follows all simple commands, answer question appropriately.  Bilateral vision loss, right eye subjunctive projection.  No gaze palsy, facial symmetrical.  Mild dysarthria.  Bilateral upper and lower extremity symmetrical muscle strength, sensation symmetrical.  Bilateral finger-nose grossly intact.  Etiology for patient symptoms concerning for encephalopathy, could be metabolic encephalopathy due to symptomatic hyponatremia from not eating well or infection given leukocytosis and abnormal CXR.  Hypertensive encephalopathy also in differential.  Recommend gradually normalize BP.  EEG no  seizure, no AED needed.  B12 was low, given supplement.  Continue home aspirin and statin.  Treat underlying condition, management per primary team.  Discussed with Dr. Alvino Chapel.  For detailed assessment and [name], please refer to above/below as I have made changes wherever appropriate.   Neurology will sign off. Please call with questions. Pt will follow up with stroke clinic NP at [facility] in about 4 weeks. Thanks for the consult.   Johnny [name], Johnny [name] Stroke Neurology 05/14/2022 7:23 PM    INTERVAL HISTORY Some history was obtained from his daughter.  Patient has developed dysphagia about 3 days ago that prevented him from having adequate p.o. intake.  Yesterday afternoon, he developed whole body shaking but maintains consciousness.  His daughter also reports slurred speech.  At baseline patient is not walking well and needing assistance.  He has very poor vision from his macular degeneration that started about 4 years ago.  Patient is scheduled for a debridement of his right second toe with podiatry next week.  Vitals:   05/14/22 0930 05/14/22 0945 05/14/22 1000 05/14/22 1015  BP: (!) 182/73 (!) 175/74 (!) 177/70 (!) 176/70  Pulse: 64 (!) 58 63 64  Resp: 12 14 17 20   Temp:      TempSrc:      SpO2: 95% 95% 92% 95%  Weight:       CBC:  Recent Labs  Lab 05/13/22 2159 05/13/22 2201 05/14/22 0440  WBC 13.5*  --  11.0*  NEUTROABS 10.4*  --   --   HGB 16.0 16.7 15.4  HCT 45.7 49.0 44.6  MCV 85.4  --  84.6  PLT 288  --  261  Basic Metabolic Panel:  Recent Labs  Lab 05/13/22 2159 05/13/22 2201 05/14/22 0440  NA 127* 125* 128*  K 4.1 4.1 4.2  CL 95* 92* 94*  CO2 21*  --  24  GLUCOSE 153* 148* 129*  BUN CREATININE 1.59* 1.40* 1.49*  CALCIUM 10.2  --  9.8   Lipid Panel:  Recent Labs  Lab 05/14/22 0440  CHOL 117  TRIG 85  HDL 46  CHOLHDL 2.5  VLDL 17  LDLCALC 54   HgbA1c:  Recent Labs  Lab 05/14/22 0440  HGBA1C 4.9   Urine Drug Screen: No results for  input(s): "LABOPIA", "COCAINSCRNUR", "LABBENZ", "AMPHETMU", "THCU", "LABBARB" in the last 168 hours.  Alcohol Level  Recent Labs  Lab 05/13/22 2159  ETH <10    IMAGING past 24 hours VAS US CAROTID  Result Date: 05/14/2022 Carotid Arterial Duplex Study Patient Name:  Johnny [name]  Date of Exam:   05/14/2022 Medical Rec #: [id number]        Accession #:    [id number] Date of Birth: 10-28-1932        Patient Gender: M Patient Age:   4 years Exam Location:  [facility] Procedure:      VAS US CAROTID Referring Phys: Midge Minium --------------------------------------------------------------------------------  Indications:   CVA. Risk Factors:  Hypertension, Diabetes, past history of smoking, coronary artery                disease, prior CVA. Other Factors: S/P AVR. Performing Technologist: Jean Rosenthal RDMS, RVT  Examination Guidelines: A complete evaluation includes B-mode imaging, spectral Doppler, color Doppler, and power Doppler as needed of all accessible portions of each vessel. Bilateral testing is considered an integral part of a complete examination. Limited examinations for reoccurring indications may be performed as noted.  Right Carotid Findings: +----------+--------+--------+--------+------------------+--------+           PSV cm/sEDV cm/sStenosisPlaque DescriptionComments +----------+--------+--------+--------+------------------+--------+ CCA Prox  63      11                                         +----------+--------+--------+--------+------------------+--------+ CCA Distal52      8                                          +----------+--------+--------+--------+------------------+--------+ ICA Prox  45      10      1-39%   heterogenous               +----------+--------+--------+--------+------------------+--------+ ICA Mid   41      9                                          +----------+--------+--------+--------+------------------+--------+ ICA  Distal38      9                                          +----------+--------+--------+--------+------------------+--------+ ECA       91      9                                          +----------+--------+--------+--------+------------------+--------+ +----------+--------+-------+----------------+-------------------+  PSV cm/sEDV cmsDescribe        Arm Pressure (mmHG) +----------+--------+-------+----------------+-------------------+ [id number]             Multiphasic, WNL                    +----------+--------+-------+----------------+-------------------+ +---------+--------+--+--------+-+---------+ VertebralPSV cm/s51EDV cm/s8Antegrade +---------+--------+--+--------+-+---------+  Left Carotid Findings: +----------+--------+--------+--------+------------------+--------+           PSV cm/sEDV cm/sStenosisPlaque DescriptionComments +----------+--------+--------+--------+------------------+--------+ CCA Prox  56      7                                          +----------+--------+--------+--------+------------------+--------+ CCA Distal58      12              heterogenous               +----------+--------+--------+--------+------------------+--------+ ICA Prox  67      11      1-39%   heterogenous               +----------+--------+--------+--------+------------------+--------+ ICA Mid   62      12                                         +----------+--------+--------+--------+------------------+--------+ ICA Distal38      10                                         +----------+--------+--------+--------+------------------+--------+ ECA       155     14                                         +----------+--------+--------+--------+------------------+--------+ +----------+--------+--------+----------------+-------------------+           PSV cm/sEDV cm/sDescribe        Arm Pressure (mmHG)  +----------+--------+--------+----------------+-------------------+ [id number]              Multiphasic, WNL                    +----------+--------+--------+----------------+-------------------+ +---------+--------+--+--------+-+---------+ VertebralPSV cm/s35EDV cm/s8Antegrade +---------+--------+--+--------+-+---------+   Summary: Right Carotid: Velocities in the right ICA are consistent with a 1-39% stenosis. Left Carotid: Velocities in the left ICA are consistent with a 1-39% stenosis. Vertebrals:  Bilateral vertebral arteries demonstrate antegrade flow. Subclavians: Normal flow hemodynamics were seen in bilateral subclavian              arteries. *See table(s) above for measurements and observations.     Preliminary    DG CHEST PORT 1 VIEW  Result Date: 05/14/2022 CLINICAL DATA:  Leukocytosis EXAM: PORTABLE CHEST 1 VIEW COMPARISON:  04/30/2017 FINDINGS: Chronic cardiac enlargement. Prior CABG and aortic valve repair. Diffuse interstitial prominence, likely vascular congestion. Asymmetric indistinct density at the medial right base could be pneumonia. No effusion or pneumothorax. IMPRESSION: 1. Cardiomegaly and vascular congestion. 2. Cannot exclude pneumonia at the medial right base. Electronically Signed   By: Tiburcio Pea M.D.   On: 05/14/2022 05:36   MR BRAIN WO CONTRAST  Result Date: 05/14/2022 CLINICAL DATA:  Follow-up examination for neuro deficit, stroke suspected. EXAM: MRI HEAD WITHOUT CONTRAST  MRA HEAD WITHOUT CONTRAST TECHNIQUE: Multiplanar, multi-echo pulse sequences of the brain and surrounding structures were acquired without intravenous contrast. Angiographic images of the Circle of Willis were acquired using MRA technique without intravenous contrast. COMPARISON:  Prior CT from 05/13/2022. FINDINGS: MRI HEAD FINDINGS Brain: Diffuse prominence of the CSF containing spaces compatible generalized cerebral atrophy. Patchy and confluent T2/FLAIR hyperintensity involving the  periventricular and deep white matter both cerebral hemispheres, consistent with chronic small vessel ischemic disease, mild to moderate in nature. Few scatter remote lacunar infarcts present about the hemispheric cerebral white matter, right basal ganglia, and pons. Few small remote bilateral cerebellar infarcts, right slightly worse than left. No abnormal foci of restricted diffusion to suggest acute or subacute ischemia. Gray-white matter differentiation maintained. No other areas of chronic cortical infarction. Single chronic microhemorrhage noted within the right cerebellum. No other significant acute or chronic intracranial blood products. No mass lesion, midline shift or mass effect. No hydrocephalus or extra-axial fluid collection. Pituitary gland and suprasellar region within normal limits. Vascular: Major intracranial vascular flow voids are maintained. Vertebrobasilar system is somewhat tortuous, partially indenting the medulla. Skull and upper cervical spine: Craniocervical junction within normal limits. Bone marrow signal intensity normal. No scalp soft tissue abnormality. Sinuses/Orbits: Irregular signal abnormality at the posterolateral right globe, again suggestive of possible vitreous hemorrhage (series 19, image 9). Paranasal sinuses are largely clear. No significant mastoid effusion. Other: None. MRA HEAD FINDINGS Anterior circulation: Both internal carotid arteries patent to the termini without stenosis or other abnormality. A1 segments patent bilaterally. Normal anterior communicating artery complex. Atheromatous irregularity within both ACAs without proximal high-grade stenosis. No M1 stenosis or occlusion. Moderate to severe multifocal bilateral M2 stenoses noted (series 1054, images 3, 11). Extensive small vessel atheromatous irregularity seen distally throughout the MCA branches. Posterior circulation: Both vertebral arteries patent without stenosis. Left PICA patent at its origin. Right  PICA not well seen. Basilar patent to its distal aspect without stenosis. Superior cerebellar arteries patent bilaterally. Both PCAs primarily supplied via the basilar. Moderate multifocal right P2 stenoses (series 1036, image 13). Distal small vessel atheromatous irregularity noted within both PCAs. Anatomic variants: None significant.  No aneurysm. IMPRESSION: MRI HEAD IMPRESSION: 1. No acute intracranial abnormality. 2. Generalized cerebral atrophy with chronic microvascular ischemic disease, with a few scattered remote lacunar infarcts involving the hemispheric cerebral white matter, right basal ganglia, and pons. 3. Few small remote bilateral cerebellar infarcts, right worse than left. MRA HEAD IMPRESSION: 1. Negative intracranial MRA for large vessel occlusion. 2. Intracranial atherosclerotic disease with associated moderate to severe multifocal bilateral M2 and right P2 stenoses. Extensive distal small vessel atheromatous irregularity. Electronically Signed   By: Jeannine Boga M.D.   On: 05/14/2022 03:56   MR ANGIO HEAD WO CONTRAST  Result Date: 05/14/2022 CLINICAL DATA:  Follow-up examination for neuro deficit, stroke suspected. EXAM: MRI HEAD WITHOUT CONTRAST MRA HEAD WITHOUT CONTRAST TECHNIQUE: Multiplanar, multi-echo pulse sequences of the brain and surrounding structures were acquired without intravenous contrast. Angiographic images of the Circle of Willis were acquired using MRA technique without intravenous contrast. COMPARISON:  Prior CT from 05/13/2022. FINDINGS: MRI HEAD FINDINGS Brain: Diffuse prominence of the CSF containing spaces compatible generalized cerebral atrophy. Patchy and confluent T2/FLAIR hyperintensity involving the periventricular and deep white matter both cerebral hemispheres, consistent with chronic small vessel ischemic disease, mild to moderate in nature. Few scatter remote lacunar infarcts present about the hemispheric cerebral white matter, right basal ganglia,  and pons. Few small remote bilateral cerebellar infarcts,  right slightly worse than left. No abnormal foci of restricted diffusion to suggest acute or subacute ischemia. Gray-white matter differentiation maintained. No other areas of chronic cortical infarction. Single chronic microhemorrhage noted within the right cerebellum. No other significant acute or chronic intracranial blood products. No mass lesion, midline shift or mass effect. No hydrocephalus or extra-axial fluid collection. Pituitary gland and suprasellar region within normal limits. Vascular: Major intracranial vascular flow voids are maintained. Vertebrobasilar system is somewhat tortuous, partially indenting the medulla. Skull and upper cervical spine: Craniocervical junction within normal limits. Bone marrow signal intensity normal. No scalp soft tissue abnormality. Sinuses/Orbits: Irregular signal abnormality at the posterolateral right globe, again suggestive of possible vitreous hemorrhage (series 19, image 9). Paranasal sinuses are largely clear. No significant mastoid effusion. Other: None. MRA HEAD FINDINGS Anterior circulation: Both internal carotid arteries patent to the termini without stenosis or other abnormality. A1 segments patent bilaterally. Normal anterior communicating artery complex. Atheromatous irregularity within both ACAs without proximal high-grade stenosis. No M1 stenosis or occlusion. Moderate to severe multifocal bilateral M2 stenoses noted (series 1054, images 3, 11). Extensive small vessel atheromatous irregularity seen distally throughout the MCA branches. Posterior circulation: Both vertebral arteries patent without stenosis. Left PICA patent at its origin. Right PICA not well seen. Basilar patent to its distal aspect without stenosis. Superior cerebellar arteries patent bilaterally. Both PCAs primarily supplied via the basilar. Moderate multifocal right P2 stenoses (series 1036, image 13). Distal small vessel  atheromatous irregularity noted within both PCAs. Anatomic variants: None significant.  No aneurysm. IMPRESSION: MRI HEAD IMPRESSION: 1. No acute intracranial abnormality. 2. Generalized cerebral atrophy with chronic microvascular ischemic disease, with a few scattered remote lacunar infarcts involving the hemispheric cerebral white matter, right basal ganglia, and pons. 3. Few small remote bilateral cerebellar infarcts, right worse than left. MRA HEAD IMPRESSION: 1. Negative intracranial MRA for large vessel occlusion. 2. Intracranial atherosclerotic disease with associated moderate to severe multifocal bilateral M2 and right P2 stenoses. Extensive distal small vessel atheromatous irregularity. Electronically Signed   By: Rise Mu M.D.   On: 05/14/2022 03:56   DG Foot 2 Views Right  Result Date: 05/14/2022 CLINICAL DATA:  [id number]. Osteomyelitis. Last surgery was 2 months ago. EXAM: RIGHT FOOT - 2 VIEW COMPARISON:  Right foot series 07/21/2021. FINDINGS: AP Lat only. Again noted is prior amputation of the right great toe. There are hammertoe deformities of the second through fifth toes. Due to poor positioning and overlapping structures especially on the AP, evaluation is limited. There is no appreciable destructive bone lesion, but the base of the metatarsals are not well seen and the toes are not well seen either. There is profound osteopenia. Midfoot arthrosis is noted and a small plantar calcaneal enthesophyte with spurring at the tibiotalar joint. There is diffuse soft tissue edema. No soft tissue gas or radiopaque foreign body are seen. IMPRESSION: 1. Limited study due to poor positioning and overlapping structures. No appreciable destructive bone lesion. 2. Diffuse soft tissue edema. 3. Previous amputation of the great toe. 4. Osteopenia and degenerative change. Electronically Signed   By: Almira Bar M.D.   On: 05/14/2022 03:52   CT HEAD CODE STROKE WO CONTRAST  Result Date:  05/13/2022 CLINICAL DATA:  Code stroke. Initial evaluation for neuro deficit, stroke suspected. EXAM: CT HEAD WITHOUT CONTRAST TECHNIQUE: Contiguous axial images were obtained from the base of the skull through the vertex without intravenous contrast. RADIATION DOSE REDUCTION: This exam was performed according to the departmental dose-optimization program which  includes automated exposure control, adjustment of the mA and/or kV according to patient size and/or use of iterative reconstruction technique. COMPARISON:  Prior study from 03/10/2021. FINDINGS: Brain: Moderately advanced cerebral atrophy with mild chronic small vessel ischemic disease. Small remote right cerebellar infarct. No acute intracranial hemorrhage. No acute large vessel territory infarct. No mass lesion or midline shift. No hydrocephalus or extra-axial fluid collection. Vascular: No abnormal hyperdense vessel. Calcified atherosclerosis present at the skull base. Skull: Scalp soft tissues and calvarium demonstrate no acute finding. Sinuses/Orbits: Abnormal hyperdensity seen involving the posterior aspect of the right globe, suggestive of possible hemorrhage (series 2, image 9). Postoperative changes noted about the contralateral left globe. Paranasal sinuses are clear. Other: No mastoid effusion. ASPECTS [facility]([place] Stroke Program Early CT Score) - Ganglionic level infarction (caudate, lentiform nuclei, internal capsule, insula, M1-M3 cortex): 7 - Supraganglionic infarction (M4-M6 cortex): 3 Total score (0-10 with 10 being normal): 10 IMPRESSION: 1. No acute intracranial abnormality. 2. ASPECTS is 10. 3. Hyperdensity involving the posterior aspect of the right globe, suggesting vitreous hemorrhage. Correlation with physical exam recommended. 4. Atrophy with chronic microvascular ischemic disease. Small remote right cerebellar infarct. These results were communicated to Dr. Wilford CornerArora at 10:06 pm on 05/13/2022 by text page via the [facility] messaging system.  Electronically Signed   By: Rise MuBenjamin  McClintock M.D.   On: 05/13/2022 22:08    PHYSICAL EXAM Physical Exam Gen: A&O x4, NAD HEENT: Atraumatic, normocephalic;mucous membranes moist; oropharynx clear, tongue without atrophy or fasciculations. Neck: Supple, trachea midline. Resp: No respiratory distress Extrem: Nml bulk; no cyanosis, clubbing, or edema. Hammertoes noted on the right foot.  There is a small superficial wound seen on his right second toe.  There is no obvious erythema of the surrounding skin.  Neuro: *MS: A&O x4. Follows multi-step commands. *Speech: fluent, nondysarthric. No aphasia. Naming and repetition intact.  *CN:    I: Deferred   II,III: Unable to perform due to severe macular degeneration   III,IV,VI: Unable to perform due to severe macular degeneration   V: Sensation intact from V1 to V3 to LT   VII: Eyelid closure was full.  Smile symmetric.   VIII: Hearing intact to voice   IX,X: Voice normal, palate elevates symmetrically    XI: SCM/trap 5/5 bilat   XII: Tongue protrudes midline, no atrophy or fasciculations   *Motor:   Normal bulk.  No tremor, rigidity or bradykinesia. No pronator drift.    Strength: Dlt Bic Tri WrE WrF FgS Gr HF KnF KnE PlF DoF    Left 5 5 5 5 5 5 5 5 5 5 5 5     Right 5 5 5 5 5 5 5 5 5 5 5 5     *Sensory: Intact to light touch throughout. Symmetric.   ASSESSMENT/[name] Mr. Ignacia FellingSolomon B Esch is a 88 y.o. male with history of diabetes, macular degeneration causing blindness, coronary artery disease, aortic stenosis, hypertension, prior history of cryptogenic strokes, presenting for evaluation of sudden onset of slurred speech with whole body shaking that started yesterday afternoon.  CVA ruled out with normal MRI.  His overall symptoms are not consistent with a TIA either.  No hypotensive events noted since admission.  His whole body shaking movement while retaining consciousness this is also not consistent with seizure.  Overall this is more  consistent with symptomatic hyponatremia from poor p.o. intake in combination with physical deconditioning and possible infection related. We will finish his work-up with an EEG.  Pending SLP evaluation and possible  barium swallow to figure out the etiology of his dysphagia.  Encephalopathy - metabolic versus hypertensive Symptomatic hyponatremia vs physical deconditioning +/- infection  Code Stroke CT head No acute abnormality. ASPECTS 10.    MRA head & neck : Negative intracranial MRA for large vessel occlusion. Intracranial atherosclerotic disease with associated moderate to severe multifocal bilateral M2 and right P2 stenoses. Extensive distal small vessel atheromatous irregularity. MRI  no acute changes. Remote bilat cerebellar infarcts, R>L Carotid Doppler  unremarkable  2D Echo EF 60-65% LDL 54 HgbA1c 4.7 VTE prophylaxis - [place] EEG mild diffuse encephalopathy  Rectal aspirin for now given inability to swallow. Therapy recommendations:   Pending SLP  Hypertensive urgency Home meds:  Amlodipine, Lopressor, Clonidine, Hydralazine Unable to resume his home medication due to his dysphagia.  Right now on IV hydralazine as needed.  May try clonidine patches to avoid rebound hypertension. Gradually normalize BP Long-term BP goal normotensive  Hyperlipidemia Home meds:  crestor LDL 54, goal < 70 Holding Crestor due to n.p.o. status  Diabetes type II Controlled Home meds:  Lantus, Humalog HgbA1c 4.7, goal < 7.0 CBGs SSI  Other Active Problems Hyponatremia: Likely hypovolemic secondary to poor p.o. intake.  Pending urine study to confirm.  Continue gentle hydration. Right toe wound: Limited right foot x-ray, negative for osteo.  Right now no obvious signs of infection.  Hospital day # 0  To contact Stroke Continuity provider, please refer to [email or web address]. After hours, contact General Neurology

## 2022-05-14 NOTE — Progress Notes (Signed)
Physical Therapy Evaluation Patient Details Name: Johnny [name] MRN: [id number] DOB: [birth year] Today's Date: 05/14/2022  History of Present Illness  88 yo male was admitted with slurred speech and swallowing difficulties on 11/2, now found to have some jerking movements and recent full body jerking over last 3 months.  Now suddenly is much weaker as well.  PMHx:  strokes, DM, HTN, aortic stenosis, valve replacement, macular degeneration with full vision loss, CABG, gout, dyslipidemia, CKD3b,  Clinical Impression  Pt was seen for progressing from bed to stand and to sidestep with some instability and jerking briefly during the mobility.  Has tendency to list back and laterally with these changes, but regained control and mainly appears weak.  He is hoping to return home as soon as possible and so recommending SNF for strengthening and balance control, and will follow up with goals of acute PT for recovery inpt as he tolerates as well.       Recommendations for follow up therapy are one component of a multi-disciplinary discharge planning process, led by the attending physician.  Recommendations may be updated based on patient status, additional functional criteria and insurance authorization.  Follow Up Recommendations Skilled nursing-short term rehab (<3 hours/day) Can patient physically be transported by private vehicle: No    Assistance Recommended at Discharge Frequent or constant Supervision/Assistance  Patient can return home with the following  A lot of help with walking and/or transfers;A little help with bathing/dressing/bathroom;Assistance with cooking/housework;Direct supervision/assist for medications management;Direct supervision/assist for financial management;Assist for transportation;Help with stairs or ramp for entrance    Equipment Recommendations None recommended by PT  Recommendations for Other Services       Functional Status Assessment Patient has had a recent decline  in their functional status and demonstrates the ability to make significant improvements in function in a reasonable and predictable amount of time.     Precautions / Restrictions Precautions Precautions: Fall Precaution Comments: blind Restrictions Weight Bearing Restrictions: No      Mobility  Bed Mobility Overal bed mobility: Needs Assistance Bed Mobility: Supine to Sit, Sit to Supine     Supine to sit: Min assist, HOB elevated Sit to supine: Mod assist   General bed mobility comments: min assist to get to side of bed and back    Transfers Overall transfer level: Needs assistance Equipment used: Rolling walker (2 wheels) Transfers: Sit to/from Stand Sit to Stand: Min assist           General transfer comment: steadying assist with posterior bias    Ambulation/Gait Ambulation/Gait assistance: Min assist Gait Distance (Feet): 3 Feet Assistive device: Rolling walker (2 wheels), 1 person hand held assist Gait Pattern/deviations: Step-to pattern, Decreased stride length Gait velocity: unstable reduced Gait velocity interpretation: <1.8 ft/sec, indicate of risk for recurrent falls   General Gait Details: pt has lateral and posterior instability with brief moments of jerking in standing, resolved to sidestep a bit  Stairs            Wheelchair Mobility    Modified Rankin (Stroke Patients Only)       Balance Overall balance assessment: Needs assistance Sitting-balance support: No upper extremity supported, Feet supported Sitting balance-Leahy Scale: Fair     Standing balance support: Bilateral upper extremity supported, During functional activity Standing balance-Leahy Scale: Poor                               Pertinent Vitals/Pain  Pain Assessment Pain Assessment: Faces Faces Pain Scale: No hurt    Home Living Family/patient expects to be discharged to:: Private residence Living Arrangements: Spouse/significant other Available Help  at Discharge: Family;Available 24 hours/day Type of Home: House Home Access: Stairs to enter   [employer] of Steps: 1   Home Layout: One level Home Equipment: Agricultural consultant (2 wheels);Wheelchair - Research officer, trade union - single point;Grab bars - toilet;Grab bars - tub/shower Additional Comments: has a power scooter access but cannot use due to vision    Prior Function Prior Level of Function : Needs assist       Physical Assist : Mobility (physical) Mobility (physical): Gait   Mobility Comments: RW with wife assisting ADLs Comments: reports wife assists with bathing, dressing due to low vision     Hand Dominance   Dominant Hand: Right    Extremity/Trunk Assessment   Upper Extremity Assessment Upper Extremity Assessment: Generalized weakness    Lower Extremity Assessment Lower Extremity Assessment: Generalized weakness    Cervical / Trunk Assessment Cervical / Trunk Assessment: Kyphotic (mild  )  Communication   Communication: No difficulties  Cognition Arousal/Alertness: Awake/alert Behavior During Therapy: WFL for tasks assessed/performed Overall Cognitive Status: No family/caregiver present to determine baseline cognitive functioning                                 General Comments: daughter stepped out and could not get full history from pt        General Comments General comments (skin integrity, edema, etc.): Pt was seen for progression of movement from bed to stand and back with care for his vision loss and instability in standing    Exercises     Assessment/Plan    PT Assessment Patient needs continued PT services  PT Problem List Decreased strength;Decreased activity tolerance;Decreased balance;Decreased mobility;Decreased coordination       PT Treatment Interventions DME instruction;Gait training;Functional mobility training;Therapeutic activities;Therapeutic exercise;Balance training;Neuromuscular  re-education;Patient/family education    PT Goals (Current goals can be found in the Care Plan section)  Acute Rehab PT Goals Patient Stated Goal: to get home and see his wife PT Goal Formulation: With patient/family Time For Goal Achievement: 05/28/22 Potential to Achieve Goals: Good    Frequency Min 3X/week     Co-evaluation               AM-PAC PT "6 Clicks" Mobility  Outcome Measure                  End of Session Equipment Utilized During Treatment: Gait belt Activity Tolerance: Patient limited by fatigue;Treatment limited secondary to medical complications (Comment) Patient left: in bed;with call bell/phone within reach;with family/visitor present Nurse Communication: Mobility status PT Visit Diagnosis: Unsteadiness on feet (R26.81);Muscle weakness (generalized) (M62.81);Difficulty in walking, not elsewhere classified (R26.2);History of falling (Z91.81)    Time: 4174-0814 PT Time Calculation (min) (ACUTE ONLY): 24 min   Charges:   PT Evaluation $PT Eval Moderate Complexity: 1 Mod PT Treatments $Therapeutic Activity: 8-22 mins       Ivar Drape 05/14/2022, 2:54 PM  Samul Dada, PT PhD Acute Rehab Dept. Number: [facility] [id number] and [facility] [phone]

## 2022-05-14 NOTE — Procedures (Signed)
Patient Name: Johnny [name]  MRN: [id number]  Epilepsy Attending: Lora Havens  Referring Physician/Provider: Amie Portland, MD  Date: 05/14/2022 Duration: 23.59 mins  Patient history: 88yo M with sudden onset of slurred speech with whole body shaking. EEG to evaluate for seizure  Level of alertness: Awake, asleep  AEDs during EEG study: None  Technical aspects: This EEG study was done with scalp electrodes positioned according to the 10-20 International system of electrode placement. Electrical activity was reviewed with band pass filter of 1-70Hz , sensitivity of 7 uV/mm, display speed of 61mm/sec with a 60Hz  notched filter applied as appropriate. EEG data were recorded continuously and digitally stored.  Video monitoring was available and reviewed as appropriate.  Description: The posterior dominant rhythm consists of 7.5 Hz activity of moderate voltage (25-35 uV) seen predominantly in posterior head regions, symmetric and reactive to eye opening and eye closing. Sleep was characterized by vertex waves, sleep spindles (12 to 14 Hz), maximal frontocentral region. EEG showed intermittent generalized 3 to 6 Hz theta-delta slowing. Hyperventilation and photic stimulation were not performed.     ABNORMALITY - Intermittent slow, generalized  IMPRESSION: This study is suggestive of mild diffuse encephalopathy, nonspecific etiology. No seizures or epileptiform discharges were seen throughout the recording.  Promise Weldin Barbra Sarks

## 2022-05-14 NOTE — Progress Notes (Signed)
EEG complete - results pending 

## 2022-05-14 NOTE — ED Notes (Signed)
Patient transported to MRI 

## 2022-05-14 NOTE — ED Notes (Signed)
Called lab to have A1c and Vit B12 added to previous colection. Per Maudie Mercury in the lab, she will add those on.

## 2022-05-14 NOTE — ED Notes (Signed)
EEG tech at bedside. 

## 2022-05-15 DIAGNOSIS — E871 Hypo-osmolality and hyponatremia: Secondary | ICD-10-CM

## 2022-05-15 LAB — GLUCOSE, CAPILLARY
Glucose-Capillary: 191 mg/dL — ABNORMAL HIGH (ref 70–99)
Glucose-Capillary: 151 mg/dL — ABNORMAL HIGH (ref 70–99)
Glucose-Capillary: 130 mg/dL — ABNORMAL HIGH (ref 70–99)
Glucose-Capillary: 204 mg/dL — ABNORMAL HIGH (ref 70–99)
Glucose-Capillary: 170 mg/dL — ABNORMAL HIGH (ref 70–99)
Glucose-Capillary: 225 mg/dL — ABNORMAL HIGH (ref 70–99)
Glucose-Capillary: 168 mg/dL — ABNORMAL HIGH (ref 70–99)

## 2022-05-15 LAB — BASIC METABOLIC PANEL
Anion gap: 8 (ref 5–15)
BUN: 14 mg/dL (ref 8–23)
CO2: 23 mmol/L (ref 22–32)
Calcium: 9.8 mg/dL (ref 8.9–10.3)
Chloride: 96 mmol/L — ABNORMAL LOW (ref 98–111)
Creatinine, Ser: 1.21 mg/dL (ref 0.61–1.24)
GFR, Estimated: 58 mL/min — ABNORMAL LOW (ref >60)
Glucose, Bld: 141 mg/dL — ABNORMAL HIGH (ref 70–99)
Potassium: 3.6 mmol/L (ref 3.5–5.1)
Sodium: 127 mmol/L — ABNORMAL LOW (ref 135–145)

## 2022-05-15 LAB — OSMOLALITY, URINE: Osmolality, Ur: 463 mOsm/kg (ref 300–900)

## 2022-05-15 MED ORDER — INSULIN ASPART 100 UNIT/ML IJ SOLN
0.0000 [IU] | Freq: Three times a day (TID) | INTRAMUSCULAR | Status: DC
  Administered 2022-05-15: 1 [IU] via SUBCUTANEOUS
  Administered 2022-05-15 – 2022-05-16 (×2): 2 [IU] via SUBCUTANEOUS
  Administered 2022-05-16: 1 [IU] via SUBCUTANEOUS
  Administered 2022-05-16: 2 [IU] via SUBCUTANEOUS
  Administered 2022-05-17: 1 [IU] via SUBCUTANEOUS
  Administered 2022-05-17 – 2022-05-19 (×5): 2 [IU] via SUBCUTANEOUS

## 2022-05-15 MED ORDER — SODIUM CHLORIDE 0.9 % IV SOLN: INTRAVENOUS | Status: DC

## 2022-05-15 MED ORDER — HYDRALAZINE HCL 50 MG PO TABS
100.0000 mg | ORAL_TABLET | Freq: Three times a day (TID) | ORAL | Status: DC
  Administered 2022-05-15 – 2022-05-19 (×14): 100 mg via ORAL
  Filled 2022-05-15 (×14): qty 2

## 2022-05-15 NOTE — Plan of Care (Signed)
  Problem: Coping: Goal: Ability to adjust to condition or change in health will improve Outcome: Progressing   

## 2022-05-15 NOTE — Plan of Care (Signed)
  Problem: Coping: Goal: Ability to adjust to condition or change in health will improve Outcome: Progressing   Problem: Fluid Volume: Goal: Ability to maintain a balanced intake and output will improve Outcome: Progressing   Problem: Nutritional: Goal: Maintenance of adequate nutrition will improve Outcome: Progressing   Problem: Tissue Perfusion: Goal: Adequacy of tissue perfusion will improve Outcome: Progressing   Problem: Education: Goal: Knowledge of disease or condition will improve Outcome: Progressing   Problem: Ischemic Stroke/TIA Tissue Perfusion: Goal: Complications of ischemic stroke/TIA will be minimized Outcome: Progressing   Problem: Coping: Goal: Will verbalize positive feelings about self Outcome: Progressing

## 2022-05-15 NOTE — NC FL2 (Signed)
[place] LEVEL OF CARE SCREENING TOOL     IDENTIFICATION  Patient Name: Johnny [name] Birthdate: 03-18-1933 Sex: male Admission Date (Current Location): 05/13/2022  [facility] [place] and [place] Number:  Herbalist and Address:  [facility] [place] [address], [place], [place] [address]      Provider Number: [id number]  Attending Physician Name and Address:  Dessa Phi, DO  Relative Name and Phone Number:  Johnny [name] [phone]    Current Level of Care: Hospital Recommended Level of Care: [place] Prior Approval Number:    Date Approved/Denied:   PASRR Number: [id number]  Discharge Plan: SNF    Current Diagnoses: Patient Active Problem List   Diagnosis Date Noted   TIA (transient ischemic attack) 05/13/2022   Acute renal failure superimposed on stage 3b chronic kidney disease ([place]) 03/04/2021   Osteomyelitis of great toe of right foot ([place]) 03/04/2021   Type 2 diabetes mellitus with stage 3b chronic kidney disease, with long-term current use of insulin ([place]) 03/04/2021   Mixed diabetic hyperlipidemia associated with type 2 diabetes mellitus ([place]) 03/04/2021   Cellulitis 05/06/2019   Left leg cellulitis    Acquired hammertoes of both feet 12/20/2018   Bilateral impacted cerumen 08/11/2018   Hearing loss 08/11/2018   Pseudophakia of right eye    Macular edema    Heart murmur    History of gout    GERD without esophagitis    Diabetic retinopathy ([place])    Coronary artery disease involving native heart without angina pectoris    UTI (urinary tract infection)    Bacteremia    Leukocytosis    Hyperlipidemia LDL goal <70    Legally blind    Acute ischemic stroke ([place]) 04/27/2017   Stroke (cerebrum) ([place]) 04/27/2017   Vertigo 04/25/2017   CKD (chronic kidney disease) stage 3, GFR 30-59 ml/min (HCC) 04/25/2017   Mechanical breakdown of intraocular lens 09/09/2015   Ocular hypertension of left eye 09/09/2015    Bunion 07/26/2014   History of encephalopathy 01/03/2014   Aortic valve replaced 01/03/2014   CAD in native artery 01/03/2014   Renal cyst 01/03/2014   Fever 01/02/2014   Anemia of chronic kidney failure, stage 3 (moderate) ([place]) 12/12/2013   Diabetic nephropathy associated with type 2 diabetes mellitus ([place]) 12/12/2013   Iron deficiency anemia 12/12/2013   Proteinuria 12/12/2013   Secondary hyperparathyroidism of renal origin ([place]) 12/12/2013   Vitamin D deficiency 12/12/2013   Status post intraocular lens implant 11/09/2013   Pain in lower limb 10/26/2013   PCO (posterior capsular opacification) 10/05/2013   Combined senile cataract 08/06/2013   Branch retinal vein occlusion 08/01/2013   Severe hypoxemia 04/07/2013   Acute on chronic heart failure ([place]) 01/14/2013   Onychomycosis 11/17/2012   Pain in joint, ankle and foot 11/17/2012   Aortic stenosis 02/25/2012   Abnormal ECG 02/25/2012   Essential hypertension    Paresthesia    Macular degeneration    History of CVA (cerebrovascular accident)    Ptosis    Gait disturbance    Central serous chorioretinopathy 09/14/2011   History of stroke 08/13/2011    Orientation RESPIRATION BLADDER Height & Weight     Self, Time, Situation, Place  Normal Incontinent, External catheter Weight: 182 lb 15.7 oz (83 kg) Height:  5\' 7"  (170.2 cm)  BEHAVIORAL SYMPTOMS/MOOD NEUROLOGICAL BOWEL NUTRITION STATUS      Continent Diet (see dc summary)  AMBULATORY STATUS COMMUNICATION OF NEEDS Skin  Extensive Assist Verbally Surgical wounds, Other (Comment) (Pressure injury 05/14/22 toe anterior right, closed incision 03/07/21 right foot)                       Personal Care Assistance Level of Assistance  Bathing, Feeding, Dressing Bathing Assistance: Limited assistance Feeding assistance: Limited assistance Dressing Assistance: Limited assistance     Functional Limitations Info  Sight, Hearing, Speech Sight Info: Impaired Hearing  Info: Adequate Speech Info: Adequate    SPECIAL CARE FACTORS FREQUENCY  PT (By licensed PT), OT (By licensed OT)     PT Frequency: 3x week OT Frequency: 3x week            Contractures Contractures Info: Not present    Additional Factors Info  Code Status, Allergies, Psychotropic, Insulin Sliding Scale Code Status Info: Full code Allergies Info: Gabapentin  Glyxambi (Empagliflozin-linagliptin)  Heparin Psychotropic Info: cloNIDine (CATAPRES) tablet 0.1 mg  hydrALAZINE (APRESOLINE) tablet 100 mg Insulin Sliding Scale Info: insulin aspart (novoLOG) injection 0-6 Units       Current Medications (05/15/2022):  This is the current hospital active medication list Current Facility-Administered Medications  Medication Dose Route Frequency Provider Last Rate Last Admin   0.9 %  sodium chloride infusion   Intravenous Continuous Noralee Stain, DO 100 mL/hr at 05/15/22 0934 New Bag at 05/15/22 0934   acetaminophen (TYLENOL) tablet 650 mg  650 mg Oral Q4H PRN Eduard Clos, MD       Or   acetaminophen (TYLENOL) 160 MG/5ML solution 650 mg  650 mg Per Tube Q4H PRN Eduard Clos, MD       Or   acetaminophen (TYLENOL) suppository 650 mg  650 mg Rectal Q4H PRN Eduard Clos, MD       amLODipine (NORVASC) tablet 10 mg  10 mg Oral Daily Noralee Stain, DO   10 mg at 05/15/22 1610   aspirin EC tablet 81 mg  81 mg Oral Daily Marvel Plan, MD   81 mg at 05/15/22 0935   cloNIDine (CATAPRES) tablet 0.1 mg  0.1 mg Oral BID Noralee Stain, DO   0.1 mg at 05/15/22 9604   cyanocobalamin (VITAMIN B12) tablet 1,000 mcg  1,000 mcg Oral Daily Marvel Plan, MD   1,000 mcg at 05/15/22 0934   fondaparinux (ARIXTRA) injection 2.5 mg  2.5 mg Subcutaneous Daily Eduard Clos, MD   2.5 mg at 05/15/22 0934   hydrALAZINE (APRESOLINE) injection 10 mg  10 mg Intravenous Q4H PRN Eduard Clos, MD   10 mg at 05/14/22 5409   hydrALAZINE (APRESOLINE) tablet 100 mg  100 mg Oral Q8H Noralee Stain, DO   100 mg at 05/15/22 0934   insulin aspart (novoLOG) injection 0-6 Units  0-6 Units Subcutaneous Q4H Eduard Clos, MD   1 Units at 05/15/22 0440   levothyroxine (SYNTHROID) tablet 25 mcg  25 mcg Oral Q0600 Noralee Stain, DO   25 mcg at 05/15/22 0548   metoprolol tartrate (LOPRESSOR) tablet 50 mg  50 mg Oral BID Noralee Stain, DO   50 mg at 05/15/22 0934   rosuvastatin (CRESTOR) tablet 10 mg  10 mg Oral QHS Noralee Stain, DO   10 mg at 05/14/22 2136     Discharge Medications: Please see discharge summary for a list of discharge medications.  Relevant Imaging Results:  Relevant Lab Results:   Additional Information  SS# [id number] B [place], LCSWA

## 2022-05-15 NOTE — Progress Notes (Signed)
PROGRESS NOTE    Johnny [name]  YOV:[id number] DOB: 04-Feb-1933 DOA: 05/13/2022 PCP: Merri Brunette, MD     Brief Narrative:  Johnny [name] is a 88 y.o. male with history of CAD status post CABG, bioprosthetic aortic valve replacement, diabetes mellitus type 2, hypertension, prior stroke, chronic kidney disease stage III, chronic nonhealing ulcer on the second toe of the right foot was found to have increasing slurred speech yesterday and was brought to the ER.  Over the last 3 days patient has been having difficulty swallowing and has not had any much to eat.  Also has been found to have some jerking spells which involves his whole body over the last 2 to 3 months.  Last evening around 7:30 PM patient was noticed to be more weaker than usual was noticed to have slurred speech and was brought to the ER.    In the ER patient was evaluated by neurologist had CT head done.  Patient's labs show hyponatremia and leukocytosis.  Patient admitted for further management for possible stroke.  On exam patient has weakness on the left upper and lower extremity.  Failed stroke swallow.  Neurology was consulted.  Work up was unrevealing for stroke or TIA. His symptoms are thought to be metabolic in nature with hyponatremia.  New events last 24 hours / Subjective: Patient ate breakfast this morning.  Slurred speech seems to be improved.  SNF recommended by PT  Assessment & Plan:   Principal Problem:   Hyponatremia Active Problems:   Type 2 diabetes mellitus with stage 3b chronic kidney disease, with long-term current use of insulin (HCC)   Essential hypertension   History of CVA (cerebrovascular accident)   Aortic valve replaced   CKD (chronic kidney disease) stage 3, GFR 30-59 ml/min (HCC)   Slurred speech   Hyponatremia -Increase IV fluid rate today. Trend BMP   Slurred speech -MRI negative for acute stroke -MRA head negative for intracranial large vessel occlusion -Echocardiogram EF 60 to  65%, grade 2 diastolic dysfunction -Carotid ultrasound negative -EEG suggestive of mild diffuse encephalopathy, no seizures or epileptiform discharges seen -Aspirin, crestor   Hypertensive urgency -Resume home blood pressure medications including Norvasc, Catapres, hydralazine, metoprolol  Vitamin B12 deficiency -Replace  Hypothyroidism -Synthroid  Diabetes mellitus -Sliding scale insulin  Legally blind bilaterally due to macular degeneration  In agreement with assessment of the pressure ulcer as below:  Pressure Injury 05/14/22 Toe (Comment  which one) Anterior;Right Unstageable - Full thickness tissue loss in which the base of the injury is covered by slough (yellow, tan, gray, green or brown) and/or eschar (tan, brown or black) in the wound bed. diabe (Active)  05/14/22 1830  Location: Toe (Comment  which one)  Location Orientation: Anterior;Right  Staging: Unstageable - Full thickness tissue loss in which the base of the injury is covered by slough (yellow, tan, gray, green or brown) and/or eschar (tan, brown or black) in the wound bed.  Wound Description (Comments): diabetic ulcer  Present on Admission: Yes  Dressing Type None 05/15/22 0440         DVT prophylaxis:  fondaparinux (ARIXTRA) injection 2.5 mg Start: 05/14/22 1000 SCD's Start: 05/14/22 0238  Code Status: Full Family Communication: None at bedside Disposition Plan:  Status is: Inpatient Remains inpatient appropriate because: SNF placement, IVF for Hyponatremia    Antimicrobials:  Anti-infectives (From admission, onward)    None        Objective: Vitals:   05/14/22 1928 05/14/22 2349 05/15/22 0341  05/15/22 0758  BP: (!) 197/86 (!) 179/83 (!) 194/89 (!) 196/83  Pulse: 78 95 74 67  Resp: Temp: 98.1 F (36.7 C) 98.2 F (36.8 C) 100 F (37.8 C) 99 F (37.2 C)  TempSrc: Oral Oral Oral Oral  SpO2: 96% 95% 100% 96%  Weight:      Height:        Intake/Output Summary (Last 24  hours) at 05/15/2022 1128 Last data filed at 05/15/2022 1100 Gross per 24 hour  Intake 1099.89 ml  Output 1000 ml  Net 99.89 ml   Filed Weights   05/13/22 2158 Jun 06, 2022 1338  Weight: 83 kg 83 kg    Examination:  General exam: Appears calm and comfortable  Respiratory system: Clear to auscultation. Respiratory effort normal. No respiratory distress. No conversational dyspnea.  Cardiovascular system: S1 & S2 heard, RRR. No murmurs. No pedal edema. Gastrointestinal system: Abdomen is nondistended, soft and nontender. Normal bowel sounds heard. Central nervous system: Alert and oriented. No focal neurological deficits. Speech clear.  Extremities: Symmetric in appearance  Skin: No rashes, lesions or ulcers on exposed skin  Psychiatry: Mood & affect appropriate.   Data Reviewed: I have personally reviewed following labs and imaging studies  CBC: Recent Labs  Lab 05/13/22 2159 05/13/22 2201 2022-06-06 0440  WBC 13.5*  --  11.0*  NEUTROABS 10.4*  --   --   HGB 16.0 16.7 15.4  HCT 45.7 49.0 44.6  MCV 85.4  --  84.6  PLT 288  --  261   Basic Metabolic Panel: Recent Labs  Lab 05/13/22 2159 05/13/22 2201 06/06/2022 0440 05/15/22 0218  NA 127* 125* 128* 127*  K 4.1 4.1 4.2 3.6  CL 95* 92* 94* 96*  CO2 21*  --  24 23  GLUCOSE 153* 148* 129* 141*  BUN CREATININE 1.59* 1.40* 1.49* 1.21  CALCIUM 10.2  --  9.8 9.8   GFR: Estimated Creatinine Clearance: 44.3 mL/min (by C-G formula based on SCr of 1.21 mg/dL). Liver Function Tests: Recent Labs  Lab 05/13/22 2159 06-Jun-2022 0440  AST 29 22  ALT 25 21  ALKPHOS 66 65  BILITOT 1.0 1.0  PROT 6.9 6.2*  ALBUMIN 3.6 3.3*   No results for input(s): "LIPASE", "AMYLASE" in the last 168 hours. Recent Labs  Lab 06/06/22 1101  AMMONIA 25   Coagulation Profile: Recent Labs  Lab 05/13/22 2159  INR 1.1   Cardiac Enzymes: No results for input(s): "CKTOTAL", "CKMB", "CKMBINDEX", "TROPONINI" in the last 168 hours. BNP  (last 3 results) No results for input(s): "PROBNP" in the last 8760 hours. HbA1C: Recent Labs    06/06/2022 0440  HGBA1C 4.9   CBG: Recent Labs  Lab 06/06/22 1837 06-06-2022 1944 06-Jun-2022 2348 05/15/22 0338 05/15/22 0756  GLUCAP 169* 191* 169* 151* 130*   Lipid Profile: Recent Labs    06/06/2022 0440  CHOL 117  HDL 46  LDLCALC 54  TRIG 85  CHOLHDL 2.5   Thyroid Function Tests: Recent Labs    06-06-2022 0440  TSH 3.717   Anemia Panel: Recent Labs    2022-06-06 0440  VITAMINB12 162*   Sepsis Labs: No results for input(s): "PROCALCITON", "LATICACIDVEN" in the last 168 hours.  No results found for this or any previous visit (from the past 240 hour(s)).    Radiology Studies: EEG adult  Result Date: 2022/06/06 Charlsie Quest, MD     06/06/2022  4:02 PM Patient Name: Gilda Crease  Ocallaghan MRN: [id number] Epilepsy Attending: Charlsie Quest Referring Physician/Provider: Milon Dikes, MD Date: 05/14/2022 Duration: 23.59 mins Patient history: 88yo M with sudden onset of slurred speech with whole body shaking. EEG to evaluate for seizure Level of alertness: Awake, asleep AEDs during EEG study: None Technical aspects: This EEG study was done with scalp electrodes positioned according to the 10-20 International system of electrode placement. Electrical activity was reviewed with band pass filter of 1-70Hz , sensitivity of 7 uV/mm, display speed of 62mm/sec with a 60Hz  notched filter applied as appropriate. EEG data were recorded continuously and digitally stored.  Video monitoring was available and reviewed as appropriate. Description: The posterior dominant rhythm consists of 7.5 Hz activity of moderate voltage (25-35 uV) seen predominantly in posterior head regions, symmetric and reactive to eye opening and eye closing. Sleep was characterized by vertex waves, sleep spindles (12 to 14 Hz), maximal frontocentral region. EEG showed intermittent generalized 3 to 6 Hz theta-delta slowing.  Hyperventilation and photic stimulation were not performed.   ABNORMALITY - Intermittent slow, generalized IMPRESSION: This study is suggestive of mild diffuse encephalopathy, nonspecific etiology. No seizures or epileptiform discharges were seen throughout the recording.   ECHOCARDIOGRAM COMPLETE  Result Date: 05/14/2022    ECHOCARDIOGRAM REPORT   Patient Name:   MICHEL HENDON Date of Exam: 05/14/2022 Medical Rec #:  [id number]       Height:       67.0 in Accession #:    [id number]      Weight:       183.0 lb Date of Birth:  08-29-1932       BSA:          1.947 m Patient Age:    89 years        BP:           188/76 mmHg Patient Gender: M               HR:           63 bpm. Exam Location:  Inpatient Procedure: 2D Echo, Color Doppler and Cardiac Doppler Indications:    Stroke  History:        Patient has prior history of Echocardiogram examinations, most                 recent 04/27/2017. CAD, Stroke, Signs/Symptoms:Murmur; Risk                 Factors:Hypertension and Diabetes.                 Aortic Valve: bioprosthetic valve is present in the aortic                 position.  Sonographer:    04/29/2017 Referring Phys: [id number] ARSHAD N KAKRAKANDY IMPRESSIONS  1. Left ventricular ejection fraction, by estimation, is 60 to 65%. The left ventricle has normal function. The left ventricle has no regional wall motion abnormalities. There is mild concentric left ventricular hypertrophy. Left ventricular diastolic parameters are consistent with Grade II diastolic dysfunction (pseudonormalization). Elevated left atrial pressure.  2. Right ventricular systolic function is normal. The right ventricular size is normal. Tricuspid regurgitation signal is inadequate for assessing PA pressure.  3. Left atrial size was mildly dilated.  4. The mitral valve is normal in structure. Trivial mitral valve regurgitation. No evidence of mitral stenosis.  5. The aortic valve has been repaired/replaced. Aortic valve  regurgitation is not visualized. No aortic stenosis is present.  There is a bioprosthetic valve present in the aortic position.  6. The inferior vena cava is normal in size with greater than 50% respiratory variability, suggesting right atrial pressure of 3 mmHg. FINDINGS  Left Ventricle: Left ventricular ejection fraction, by estimation, is 60 to 65%. The left ventricle has normal function. The left ventricle has no regional wall motion abnormalities. The left ventricular internal cavity size was normal in size. There is  mild concentric left ventricular hypertrophy. Left ventricular diastolic parameters are consistent with Grade II diastolic dysfunction (pseudonormalization). Elevated left atrial pressure. Right Ventricle: The right ventricular size is normal. Right ventricular systolic function is normal. Tricuspid regurgitation signal is inadequate for assessing PA pressure. The tricuspid regurgitant velocity is 1.87 m/s, and with an assumed right atrial  pressure of 3 mmHg, the estimated right ventricular systolic pressure is 17.0 mmHg. Left Atrium: Left atrial size was mildly dilated. Right Atrium: Right atrial size was normal in size. Pericardium: There is no evidence of pericardial effusion. Mitral Valve: The mitral valve is normal in structure. Mild mitral annular calcification. Trivial mitral valve regurgitation. No evidence of mitral valve stenosis. Tricuspid Valve: The tricuspid valve is normal in structure. Tricuspid valve regurgitation is trivial. No evidence of tricuspid stenosis. Aortic Valve: The aortic valve has been repaired/replaced. Aortic valve regurgitation is not visualized. No aortic stenosis is present. Aortic valve mean gradient measures 8.0 mmHg. Aortic valve peak gradient measures 15.8 mmHg. Aortic valve area, by VTI  measures 1.56 cm. There is a bioprosthetic valve present in the aortic position. Pulmonic Valve: The pulmonic valve was normal in structure. Pulmonic valve regurgitation is  not visualized. No evidence of pulmonic stenosis. Aorta: The aortic root is normal in size and structure. Venous: The inferior vena cava is normal in size with greater than 50% respiratory variability, suggesting right atrial pressure of 3 mmHg. IAS/Shunts: No atrial level shunt detected by color flow Doppler.  LEFT VENTRICLE PLAX 2D LVIDd:         4.50 cm   Diastology LVIDs:         3.10 cm   LV e' medial:    6.06 cm/s LV PW:         1.20 cm   LV E/e' medial:  16.5 LV IVS:        1.20 cm   LV e' lateral:   8.24 cm/s LVOT diam:     2.10 cm   LV E/e' lateral: 12.1 LV SV:         65 LV SV Index:   34 LVOT Area:     3.46 cm  RIGHT VENTRICLE TAPSE (M-mode): 1.7 cm LEFT ATRIUM             Index        RIGHT ATRIUM           Index LA Vol (A2C):   50.7 ml 26.04 ml/m  RA Area:     12.10 cm LA Vol (A4C):   65.5 ml 33.64 ml/m  RA Volume:   26.30 ml  13.51 ml/m LA Biplane Vol: 59.3 ml 30.45 ml/m  AORTIC VALVE AV Area (Vmax):    1.17 cm AV Area (Vmean):   1.16 cm AV Area (VTI):     1.56 cm AV Vmax:           199.00 cm/s AV Vmean:          133.000 cm/s AV VTI:            0.419  m AV Peak Grad:      15.8 mmHg AV Mean Grad:      8.0 mmHg LVOT Vmax:         67.40 cm/s LVOT Vmean:        44.500 cm/s LVOT VTI:          0.189 m LVOT/AV VTI ratio: 0.45  AORTA Ao Root diam: 2.50 cm Ao Asc diam:  2.90 cm MITRAL VALVE                TRICUSPID VALVE MV Area (PHT): 4.39 cm     TR Peak grad:   14.0 mmHg MV Decel Time: 173 msec     TR Vmax:        187.00 cm/s MV E velocity: 100.00 cm/s MV A velocity: 93.10 cm/s   SHUNTS MV E/A ratio:  1.07         Systemic VTI:  0.19 m                             Systemic Diam: 2.10 cm Kirk Ruths MD Electronically signed by Kirk Ruths MD Signature Date/Time: 05/14/2022/1:36:46 PM    Final    VAS US CAROTID  Result Date: 05/14/2022 Carotid Arterial Duplex Study Patient Name:  XZAVIAR MALOOF  Date of Exam:   05/14/2022 Medical Rec #: [id number]        Accession #:    [id number] Date of Birth:  1932-09-05        Patient Gender: M Patient Age:   88 years Exam Location:  [facility] Procedure:      VAS US CAROTID Referring Phys: Gean Birchwood --------------------------------------------------------------------------------  Indications:   CVA. Risk Factors:  Hypertension, Diabetes, past history of smoking, coronary artery                disease, prior CVA. Other Factors: S/P AVR. Performing Technologist: Darlin Coco RDMS, RVT  Examination Guidelines: A complete evaluation includes B-mode imaging, spectral Doppler, color Doppler, and power Doppler as needed of all accessible portions of each vessel. Bilateral testing is considered an integral part of a complete examination. Limited examinations for reoccurring indications may be performed as noted.  Right Carotid Findings: +----------+--------+--------+--------+------------------+--------+           PSV cm/sEDV cm/sStenosisPlaque DescriptionComments +----------+--------+--------+--------+------------------+--------+ CCA Prox  63      11                                         +----------+--------+--------+--------+------------------+--------+ CCA Distal52      8                                          +----------+--------+--------+--------+------------------+--------+ ICA Prox  45      10      1-39%   heterogenous               +----------+--------+--------+--------+------------------+--------+ ICA Mid   41      9                                          +----------+--------+--------+--------+------------------+--------+ ICA Distal38      9                                          +----------+--------+--------+--------+------------------+--------+  ECA       91      9                                          +----------+--------+--------+--------+------------------+--------+ +----------+--------+-------+----------------+-------------------+           PSV cm/sEDV cmsDescribe        Arm Pressure  (mmHG) +----------+--------+-------+----------------+-------------------+ [id number]             Multiphasic, WNL                    +----------+--------+-------+----------------+-------------------+ +---------+--------+--+--------+-+---------+ VertebralPSV cm/s51EDV cm/s8Antegrade +---------+--------+--+--------+-+---------+  Left Carotid Findings: +----------+--------+--------+--------+------------------+--------+           PSV cm/sEDV cm/sStenosisPlaque DescriptionComments +----------+--------+--------+--------+------------------+--------+ CCA Prox  56      7                                          +----------+--------+--------+--------+------------------+--------+ CCA Distal58      12              heterogenous               +----------+--------+--------+--------+------------------+--------+ ICA Prox  67      11      1-39%   heterogenous               +----------+--------+--------+--------+------------------+--------+ ICA Mid   62      12                                         +----------+--------+--------+--------+------------------+--------+ ICA Distal38      10                                         +----------+--------+--------+--------+------------------+--------+ ECA       155     14                                         +----------+--------+--------+--------+------------------+--------+ +----------+--------+--------+----------------+-------------------+           PSV cm/sEDV cm/sDescribe        Arm Pressure (mmHG) +----------+--------+--------+----------------+-------------------+ [id number]              Multiphasic, WNL                    +----------+--------+--------+----------------+-------------------+ +---------+--------+--+--------+-+---------+ VertebralPSV cm/s35EDV cm/s8Antegrade +---------+--------+--+--------+-+---------+   Summary: Right Carotid: Velocities in the right ICA are consistent with a 1-39%  stenosis. Left Carotid: Velocities in the left ICA are consistent with a 1-39% stenosis. Vertebrals:  Bilateral vertebral arteries demonstrate antegrade flow. Subclavians: Normal flow hemodynamics were seen in bilateral subclavian              arteries. *See table(s) above for measurements and observations.     Preliminary    DG CHEST PORT 1 VIEW  Result Date: 05/14/2022 CLINICAL DATA:  Leukocytosis EXAM: PORTABLE CHEST 1 VIEW COMPARISON:  04/30/2017 FINDINGS: Chronic cardiac enlargement. Prior CABG and aortic valve repair. Diffuse interstitial prominence, likely vascular congestion. Asymmetric  indistinct density at the medial right base could be pneumonia. No effusion or pneumothorax. IMPRESSION: 1. Cardiomegaly and vascular congestion. 2. Cannot exclude pneumonia at the medial right base. Electronically Signed   By: Tiburcio Pea M.D.   On: 05/14/2022 05:36   MR BRAIN WO CONTRAST  Result Date: 05/14/2022 CLINICAL DATA:  Follow-up examination for neuro deficit, stroke suspected. EXAM: MRI HEAD WITHOUT CONTRAST MRA HEAD WITHOUT CONTRAST TECHNIQUE: Multiplanar, multi-echo pulse sequences of the brain and surrounding structures were acquired without intravenous contrast. Angiographic images of the Circle of Willis were acquired using MRA technique without intravenous contrast. COMPARISON:  Prior CT from 05/13/2022. FINDINGS: MRI HEAD FINDINGS Brain: Diffuse prominence of the CSF containing spaces compatible generalized cerebral atrophy. Patchy and confluent T2/FLAIR hyperintensity involving the periventricular and deep white matter both cerebral hemispheres, consistent with chronic small vessel ischemic disease, mild to moderate in nature. Few scatter remote lacunar infarcts present about the hemispheric cerebral white matter, right basal ganglia, and pons. Few small remote bilateral cerebellar infarcts, right slightly worse than left. No abnormal foci of restricted diffusion to suggest acute or subacute  ischemia. Gray-white matter differentiation maintained. No other areas of chronic cortical infarction. Single chronic microhemorrhage noted within the right cerebellum. No other significant acute or chronic intracranial blood products. No mass lesion, midline shift or mass effect. No hydrocephalus or extra-axial fluid collection. Pituitary gland and suprasellar region within normal limits. Vascular: Major intracranial vascular flow voids are maintained. Vertebrobasilar system is somewhat tortuous, partially indenting the medulla. Skull and upper cervical spine: Craniocervical junction within normal limits. Bone marrow signal intensity normal. No scalp soft tissue abnormality. Sinuses/Orbits: Irregular signal abnormality at the posterolateral right globe, again suggestive of possible vitreous hemorrhage (series 19, image 9). Paranasal sinuses are largely clear. No significant mastoid effusion. Other: None. MRA HEAD FINDINGS Anterior circulation: Both internal carotid arteries patent to the termini without stenosis or other abnormality. A1 segments patent bilaterally. Normal anterior communicating artery complex. Atheromatous irregularity within both ACAs without proximal high-grade stenosis. No M1 stenosis or occlusion. Moderate to severe multifocal bilateral M2 stenoses noted (series 1054, images 3, 11). Extensive small vessel atheromatous irregularity seen distally throughout the MCA branches. Posterior circulation: Both vertebral arteries patent without stenosis. Left PICA patent at its origin. Right PICA not well seen. Basilar patent to its distal aspect without stenosis. Superior cerebellar arteries patent bilaterally. Both PCAs primarily supplied via the basilar. Moderate multifocal right P2 stenoses (series 1036, image 13). Distal small vessel atheromatous irregularity noted within both PCAs. Anatomic variants: None significant.  No aneurysm. IMPRESSION: MRI HEAD IMPRESSION: 1. No acute intracranial  abnormality. 2. Generalized cerebral atrophy with chronic microvascular ischemic disease, with a few scattered remote lacunar infarcts involving the hemispheric cerebral white matter, right basal ganglia, and pons. 3. Few small remote bilateral cerebellar infarcts, right worse than left. MRA HEAD IMPRESSION: 1. Negative intracranial MRA for large vessel occlusion. 2. Intracranial atherosclerotic disease with associated moderate to severe multifocal bilateral M2 and right P2 stenoses. Extensive distal small vessel atheromatous irregularity. Electronically Signed   By: Rise Mu M.D.   On: 05/14/2022 03:56   MR ANGIO HEAD WO CONTRAST  Result Date: 05/14/2022 CLINICAL DATA:  Follow-up examination for neuro deficit, stroke suspected. EXAM: MRI HEAD WITHOUT CONTRAST MRA HEAD WITHOUT CONTRAST TECHNIQUE: Multiplanar, multi-echo pulse sequences of the brain and surrounding structures were acquired without intravenous contrast. Angiographic images of the Circle of Willis were acquired using MRA technique without intravenous contrast. COMPARISON:  Prior CT from  05/13/2022. FINDINGS: MRI HEAD FINDINGS Brain: Diffuse prominence of the CSF containing spaces compatible generalized cerebral atrophy. Patchy and confluent T2/FLAIR hyperintensity involving the periventricular and deep white matter both cerebral hemispheres, consistent with chronic small vessel ischemic disease, mild to moderate in nature. Few scatter remote lacunar infarcts present about the hemispheric cerebral white matter, right basal ganglia, and pons. Few small remote bilateral cerebellar infarcts, right slightly worse than left. No abnormal foci of restricted diffusion to suggest acute or subacute ischemia. Gray-white matter differentiation maintained. No other areas of chronic cortical infarction. Single chronic microhemorrhage noted within the right cerebellum. No other significant acute or chronic intracranial blood products. No mass lesion,  midline shift or mass effect. No hydrocephalus or extra-axial fluid collection. Pituitary gland and suprasellar region within normal limits. Vascular: Major intracranial vascular flow voids are maintained. Vertebrobasilar system is somewhat tortuous, partially indenting the medulla. Skull and upper cervical spine: Craniocervical junction within normal limits. Bone marrow signal intensity normal. No scalp soft tissue abnormality. Sinuses/Orbits: Irregular signal abnormality at the posterolateral right globe, again suggestive of possible vitreous hemorrhage (series 19, image 9). Paranasal sinuses are largely clear. No significant mastoid effusion. Other: None. MRA HEAD FINDINGS Anterior circulation: Both internal carotid arteries patent to the termini without stenosis or other abnormality. A1 segments patent bilaterally. Normal anterior communicating artery complex. Atheromatous irregularity within both ACAs without proximal high-grade stenosis. No M1 stenosis or occlusion. Moderate to severe multifocal bilateral M2 stenoses noted (series 1054, images 3, 11). Extensive small vessel atheromatous irregularity seen distally throughout the MCA branches. Posterior circulation: Both vertebral arteries patent without stenosis. Left PICA patent at its origin. Right PICA not well seen. Basilar patent to its distal aspect without stenosis. Superior cerebellar arteries patent bilaterally. Both PCAs primarily supplied via the basilar. Moderate multifocal right P2 stenoses (series 1036, image 13). Distal small vessel atheromatous irregularity noted within both PCAs. Anatomic variants: None significant.  No aneurysm. IMPRESSION: MRI HEAD IMPRESSION: 1. No acute intracranial abnormality. 2. Generalized cerebral atrophy with chronic microvascular ischemic disease, with a few scattered remote lacunar infarcts involving the hemispheric cerebral white matter, right basal ganglia, and pons. 3. Few small remote bilateral cerebellar  infarcts, right worse than left. MRA HEAD IMPRESSION: 1. Negative intracranial MRA for large vessel occlusion. 2. Intracranial atherosclerotic disease with associated moderate to severe multifocal bilateral M2 and right P2 stenoses. Extensive distal small vessel atheromatous irregularity. Electronically Signed   By: Rise Mu M.D.   On: 05/14/2022 03:56   DG Foot 2 Views Right  Result Date: 05/14/2022 CLINICAL DATA:  [id number]. Osteomyelitis. Last surgery was 2 months ago. EXAM: RIGHT FOOT - 2 VIEW COMPARISON:  Right foot series 07/21/2021. FINDINGS: AP Lat only. Again noted is prior amputation of the right great toe. There are hammertoe deformities of the second through fifth toes. Due to poor positioning and overlapping structures especially on the AP, evaluation is limited. There is no appreciable destructive bone lesion, but the base of the metatarsals are not well seen and the toes are not well seen either. There is profound osteopenia. Midfoot arthrosis is noted and a small plantar calcaneal enthesophyte with spurring at the tibiotalar joint. There is diffuse soft tissue edema. No soft tissue gas or radiopaque foreign body are seen. IMPRESSION: 1. Limited study due to poor positioning and overlapping structures. No appreciable destructive bone lesion. 2. Diffuse soft tissue edema. 3. Previous amputation of the great toe. 4. Osteopenia and degenerative change. Electronically Signed   By: Almira Bar  M.D.   On: 05/14/2022 03:52   CT HEAD CODE STROKE WO CONTRAST  Result Date: 05/13/2022 CLINICAL DATA:  Code stroke. Initial evaluation for neuro deficit, stroke suspected. EXAM: CT HEAD WITHOUT CONTRAST TECHNIQUE: Contiguous axial images were obtained from the base of the skull through the vertex without intravenous contrast. RADIATION DOSE REDUCTION: This exam was performed according to the departmental dose-optimization program which includes automated exposure control, adjustment of the mA  and/or kV according to patient size and/or use of iterative reconstruction technique. COMPARISON:  Prior study from 03/10/2021. FINDINGS: Brain: Moderately advanced cerebral atrophy with mild chronic small vessel ischemic disease. Small remote right cerebellar infarct. No acute intracranial hemorrhage. No acute large vessel territory infarct. No mass lesion or midline shift. No hydrocephalus or extra-axial fluid collection. Vascular: No abnormal hyperdense vessel. Calcified atherosclerosis present at the skull base. Skull: Scalp soft tissues and calvarium demonstrate no acute finding. Sinuses/Orbits: Abnormal hyperdensity seen involving the posterior aspect of the right globe, suggestive of possible hemorrhage (series 2, image 9). Postoperative changes noted about the contralateral left globe. Paranasal sinuses are clear. Other: No mastoid effusion. ASPECTS [facility]([place] Stroke Program Early CT Score) - Ganglionic level infarction (caudate, lentiform nuclei, internal capsule, insula, M1-M3 cortex): 7 - Supraganglionic infarction (M4-M6 cortex): 3 Total score (0-10 with 10 being normal): 10 IMPRESSION: 1. No acute intracranial abnormality. 2. ASPECTS is 10. 3. Hyperdensity involving the posterior aspect of the right globe, suggesting vitreous hemorrhage. Correlation with physical exam recommended. 4. Atrophy with chronic microvascular ischemic disease. Small remote right cerebellar infarct. These results were communicated to Dr. Wilford CornerArora at 10:06 pm on 05/13/2022 by text page via the [facility] messaging system. Electronically Signed   By: Rise MuBenjamin  McClintock M.D.   On: 05/13/2022 22:08      Scheduled Meds:  amLODipine  10 mg Oral Daily   aspirin EC  81 mg Oral Daily   cloNIDine  0.1 mg Oral BID   vitamin B-12  1,000 mcg Oral Daily   fondaparinux (ARIXTRA) injection  2.5 mg Subcutaneous Daily   hydrALAZINE  100 mg Oral Q8H   insulin aspart  0-6 Units Subcutaneous Q4H   levothyroxine  25 mcg Oral Q0600   metoprolol  tartrate  50 mg Oral BID   rosuvastatin  10 mg Oral QHS   Continuous Infusions:  sodium chloride 100 mL/hr at 05/15/22 0934     LOS: 1 day     Noralee StainJennifer Janal Haak, DO [facility] 05/15/2022, 11:28 AM   Available via Epic secure chat 7am-7pm After these hours, please refer to coverage provider listed on [email or web address]

## 2022-05-15 NOTE — TOC Initial Note (Signed)
Transition of Care [facility]) - Initial/Assessment Note    Patient Details  Name: Johnny [name] MRN: [id number] Date of Birth: 10/19/1932  [facility]) CM/SW Contact:    [address]ta [address], [place] Phone Number: 05/15/2022, 10:02 AM  Clinical Narrative:                  CSW met with pt bedside, pt eating breakfast. Spoke with pt about recommendations for SNF, pt states he is ok with going to SNF, states he has been before but doesn't remember the name of it. CSW attempted to contact pt's spouse, no answer vm left. CSW asked pt if he remembered his daughter's phone number, he states he does not. CSW will work pt up for SNF but continue to try and reach spouse and daughter.        Patient Goals and CMS Choice        Expected Discharge Plan and Services                                                Prior Living Arrangements/Services                       Activities of Daily Living Home Assistive Devices/Equipment: Walker (specify type), CBG Meter, Bedside commode/3-in-1 ADL Screening (condition at time of admission) Patient's cognitive ability adequate to safely complete daily activities?: No Is the patient deaf or have difficulty hearing?: No Does the patient have difficulty seeing, even when wearing glasses/contacts?: Yes Does the patient have difficulty concentrating, remembering, or making decisions?: No Patient able to express need for assistance with ADLs?: Yes Does the patient have difficulty dressing or bathing?: Yes Independently performs ADLs?: No Communication: Independent Dressing (OT): Needs assistance Is this a change from baseline?: Pre-admission baseline Grooming: Needs assistance Is this a change from baseline?: Pre-admission baseline Feeding: Needs assistance (needs set up help) Is this a change from baseline?: Pre-admission baseline Bathing: Needs assistance Is this a change from baseline?: Pre-admission baseline Toileting:  Needs assistance Is this a change from baseline?: Pre-admission baseline In/Out Bed: Needs assistance Is this a change from baseline?: Pre-admission baseline Walks in Home: Needs assistance Is this a change from baseline?: Pre-admission baseline Does the patient have difficulty walking or climbing stairs?: Yes Weakness of Legs: Both Weakness of Arms/Hands: Both  Permission Sought/Granted                  Emotional Assessment              Admission diagnosis:  TIA (transient ischemic attack) [G45.9] Slurred speech [R47.81] Dysphagia, unspecified type [R13.10] Patient Active Problem List   Diagnosis Date Noted   TIA (transient ischemic attack) 05/13/2022   Acute renal failure superimposed on stage 3b chronic kidney disease ([place]) 03/04/2021   Osteomyelitis of great toe of right foot ([place]) 03/04/2021   Type 2 diabetes mellitus with stage 3b chronic kidney disease, with long-term current use of insulin ([place]) 03/04/2021   Mixed diabetic hyperlipidemia associated with type 2 diabetes mellitus ([place]) 03/04/2021   Cellulitis 05/06/2019   Left leg cellulitis    Acquired hammertoes of both feet 12/20/2018   Bilateral impacted cerumen 08/11/2018   Hearing loss 08/11/2018   Pseudophakia of right eye    Macular edema    Heart murmur    History of  gout    GERD without esophagitis    Diabetic retinopathy ([place])    Coronary artery disease involving native heart without angina pectoris    UTI (urinary tract infection)    Bacteremia    Leukocytosis    Hyperlipidemia LDL goal <70    Legally blind    Acute ischemic stroke ([place]) 04/27/2017   Stroke (cerebrum) ([place]) 04/27/2017   Vertigo 04/25/2017   CKD (chronic kidney disease) stage 3, GFR 30-59 ml/min (HCC) 04/25/2017   Mechanical breakdown of intraocular lens 09/09/2015   Ocular hypertension of left eye 09/09/2015   Bunion 07/26/2014   History of encephalopathy 01/03/2014   Aortic valve replaced 01/03/2014   CAD in native  artery 01/03/2014   Renal cyst 01/03/2014   Fever 01/02/2014   Anemia of chronic kidney failure, stage 3 (moderate) ([place]) 12/12/2013   Diabetic nephropathy associated with type 2 diabetes mellitus ([place]) 12/12/2013   Iron deficiency anemia 12/12/2013   Proteinuria 12/12/2013   Secondary hyperparathyroidism of renal origin ([place]) 12/12/2013   Vitamin D deficiency 12/12/2013   Status post intraocular lens implant 11/09/2013   Pain in lower limb 10/26/2013   PCO (posterior capsular opacification) 10/05/2013   Combined senile cataract 08/06/2013   Branch retinal vein occlusion 08/01/2013   Severe hypoxemia 04/07/2013   Acute on chronic heart failure ([place]) 01/14/2013   Onychomycosis 11/17/2012   Pain in joint, ankle and foot 11/17/2012   Aortic stenosis 02/25/2012   Abnormal ECG 02/25/2012   Essential hypertension    Paresthesia    Macular degeneration    History of CVA (cerebrovascular accident)    Ptosis    Gait disturbance    Central serous chorioretinopathy 09/14/2011   History of stroke 08/13/2011   PCP:  Deland Pretty, MD Pharmacy:   [id number] [place], [place] [place] [address] Phone: [phone] Fax: [phone]  [facility] - [place], [place] - [address] [place] [address] Phone: [phone] Fax: [phone]     Social Determinants of Health (SDOH) Interventions    Readmission Risk Interventions     No data to display

## 2022-05-16 ENCOUNTER — Other Ambulatory Visit (HOSPITAL_COMMUNITY): Payer: Medicare Other

## 2022-05-16 DIAGNOSIS — E871 Hypo-osmolality and hyponatremia: Secondary | ICD-10-CM | POA: Diagnosis not present

## 2022-05-16 LAB — BASIC METABOLIC PANEL
Anion gap: 8 (ref 5–15)
BUN: 13 mg/dL (ref 8–23)
CO2: 22 mmol/L (ref 22–32)
Calcium: 9.4 mg/dL (ref 8.9–10.3)
Chloride: 96 mmol/L — ABNORMAL LOW (ref 98–111)
Creatinine, Ser: 1.34 mg/dL — ABNORMAL HIGH (ref 0.61–1.24)
GFR, Estimated: 51 mL/min — ABNORMAL LOW (ref >60)
Glucose, Bld: 182 mg/dL — ABNORMAL HIGH (ref 70–99)
Potassium: 3.8 mmol/L (ref 3.5–5.1)
Sodium: 126 mmol/L — ABNORMAL LOW (ref 135–145)

## 2022-05-16 LAB — GLUCOSE, CAPILLARY
Glucose-Capillary: 179 mg/dL — ABNORMAL HIGH (ref 70–99)
Glucose-Capillary: 182 mg/dL — ABNORMAL HIGH (ref 70–99)
Glucose-Capillary: 241 mg/dL — ABNORMAL HIGH (ref 70–99)
Glucose-Capillary: 209 mg/dL — ABNORMAL HIGH (ref 70–99)
Glucose-Capillary: 256 mg/dL — ABNORMAL HIGH (ref 70–99)

## 2022-05-16 NOTE — Progress Notes (Signed)
PROGRESS NOTE    Johnny [name]  KCM:[id number] DOB: 09/09/1932 DOA: 05/13/2022 PCP: Merri Brunette, MD     Brief Narrative:  Johnny [name] is a 88 y.o. male with history of CAD status post CABG, bioprosthetic aortic valve replacement, diabetes mellitus type 2, hypertension, prior stroke, chronic kidney disease stage III, chronic nonhealing ulcer on the second toe of the right foot was found to have increasing slurred speech yesterday and was brought to the ER.  Over the last 3 days patient has been having difficulty swallowing and has not had any much to eat.  Also has been found to have some jerking spells which involves his whole body over the last 2 to 3 months.  Last evening around 7:30 PM patient was noticed to be more weaker than usual was noticed to have slurred speech and was brought to the ER.    In the ER patient was evaluated by neurologist had CT head done.  Patient's labs show hyponatremia and leukocytosis.  Patient admitted for further management for possible stroke.  On exam patient has weakness on the left upper and lower extremity.  Failed stroke swallow.  Neurology was consulted.  Work up was unrevealing for stroke or TIA. His symptoms are thought to be metabolic in nature with hyponatremia.  New events last 24 hours / Subjective: Complains of dysphagia, states he was not able to eat breakfast because he had hard time swallowing food. No issues with liquid.   Assessment & Plan:   Principal Problem:   Hyponatremia Active Problems:   Type 2 diabetes mellitus with stage 3b chronic kidney disease, with long-term current use of insulin (HCC)   Essential hypertension   History of CVA (cerebrovascular accident)   Aortic valve replaced   CKD (chronic kidney disease) stage 3, GFR 30-59 ml/min (HCC)   Slurred speech   Hyponatremia -Increase IV fluid rate today. 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 diabe (Active)  05/14/22 1830  Location: Toe (Comment  which one)  Location Orientation: Anterior;Right  Staging: Unstageable - Full thickness tissue loss in which the base of the injury is covered by slough (yellow, tan, gray, green or brown) and/or eschar (tan, brown or black) in the wound bed.  Wound Description (Comments): diabetic ulcer  Present on Admission: Yes  Dressing Type None 05/16/22 1001         DVT prophylaxis:  fondaparinux (ARIXTRA) injection 2.5 mg Start: 05/14/22 1000 SCD's Start: 05/14/22 0238  Code Status: Full Family Communication: None at bedside Disposition Plan:  Status is: Inpatient Remains inpatient appropriate because: SNF placement, IVF for Hyponatremia    Antimicrobials:  Anti-infectives (From admission, onward)    None        Objective: Vitals:  05/15/22 2325 05/16/22 0308 05/16/22 0725 05/16/22 1110  BP: (!) 165/77 (!) 196/90 (!) 185/77 (!) 172/77  Pulse: 60 77 85 72  Resp: 18 20 17 18   Temp: 98.2 F (36.8 C) 98.2 F (36.8 C) 98.1 F (36.7 C) 98.2 F (36.8 C)  TempSrc: Oral Oral Oral Oral  SpO2: 96% 95% 95% 98%  Weight:      Height:         Intake/Output Summary (Last 24 hours) at 05/16/2022 1301 Last data filed at 05/16/2022 1001 Gross per 24 hour  Intake 2423.65 ml  Output 2050 ml  Net 373.65 ml    Filed Weights   05/13/22 2158 05/14/22 1338  Weight: 83 kg 83 kg    Examination:  General exam: Appears calm and comfortable  Respiratory system: Clear to auscultation. Respiratory effort normal. No respiratory distress. No conversational dyspnea.  Cardiovascular system: S1 & S2 heard, RRR. No murmurs. No pedal edema. Gastrointestinal system: Abdomen is nondistended, soft and nontender. Normal bowel sounds heard. Central nervous system: Alert  Extremities: Symmetric in appearance  Skin: No rashes, lesions or ulcers on exposed skin  Psychiatry: Mood & affect appropriate.   Data Reviewed: I have personally reviewed following labs and imaging studies  CBC: Recent Labs  Lab 05/13/22 2159 05/13/22 2201 05/14/22 0440  WBC 13.5*  --  11.0*  NEUTROABS 10.4*  --   --   HGB 16.0 16.7 15.4  HCT 45.7 49.0 44.6  MCV 85.4  --  84.6  PLT 288  --  261    Basic Metabolic Panel: Recent Labs  Lab 05/13/22 2159 05/13/22 2201 05/14/22 0440 05/15/22 0218 05/16/22 0241  NA 127* 125* 128* 127* 126*  K 4.1 4.1 4.2 3.6 3.8  CL 95* 92* 94* 96* 96*  CO2 21*  --  24 23 22   GLUCOSE 153* 148* 129* 141* 182*  BUN 19 19 16 14 13   CREATININE 1.59* 1.40* 1.49* 1.21 1.34*  CALCIUM 10.2  --  9.8 9.8 9.4    GFR: Estimated Creatinine Clearance: 40 mL/min (A) (by C-G formula based on SCr of 1.34 mg/dL (H)). Liver Function Tests: Recent Labs  Lab 05/13/22 2159 05/14/22 0440  AST 29 22  ALT 25 21  ALKPHOS 66 65  BILITOT 1.0 1.0  PROT 6.9 6.2*  ALBUMIN 3.6 3.3*    No results for input(s): "LIPASE", "AMYLASE" in the last 168 hours. Recent Labs  Lab 05/14/22 1101  AMMONIA 25    Coagulation Profile: Recent Labs  Lab 05/13/22 2159  INR 1.1    Cardiac Enzymes: No results for input(s): "CKTOTAL", "CKMB",  "CKMBINDEX", "TROPONINI" in the last 168 hours. BNP (last 3 results) No results for input(s): "PROBNP" in the last 8760 hours. HbA1C: Recent Labs    05/14/22 0440  HGBA1C 4.9    CBG: Recent Labs  Lab 05/15/22 1924 05/15/22 2326 05/16/22 0308 05/16/22 0721 05/16/22 1106  GLUCAP 225* 168* 179* 182* 241*    Lipid Profile: Recent Labs    05/14/22 0440  CHOL 117  HDL 46  LDLCALC 54  TRIG 85  CHOLHDL 2.5    Thyroid Function Tests: Recent Labs    05/14/22 0440  TSH 3.717    Anemia Panel: Recent Labs    05/14/22 0440  VITAMINB12 162*    Sepsis Labs: No results for input(s): "PROCALCITON", "LATICACIDVEN" in the last 168 hours.  No results found for this or any previous visit (from the past 240 hour(s)).    Radiology Studies: EEG  adult  Result Date: 05/14/2022 Lora Havens, MD     05/14/2022  4:02 PM Patient Name: HARMAN LANGHANS MRN: [id number] Epilepsy Attending: Lora Havens Referring Physician/Provider: Amie [place], MD Date: 05/14/2022 Duration: 23.59 mins Patient history: 88yo M with sudden onset of slurred speech with whole body shaking. EEG to evaluate for seizure Level of alertness: Awake, asleep AEDs during EEG study: None Technical aspects: This EEG study was done with scalp electrodes positioned according to the 10-20 International system of electrode placement. Electrical activity was reviewed with band pass filter of 1-70Hz , sensitivity of 7 uV/mm, display speed of 61mm/sec with a 60Hz  notched filter applied as appropriate. EEG data were recorded continuously and digitally stored.  Video monitoring was available and reviewed as appropriate. Description: The posterior dominant rhythm consists of 7.5 Hz activity of moderate voltage (25-35 uV) seen predominantly in posterior head regions, symmetric and reactive to eye opening and eye closing. Sleep was characterized by vertex waves, sleep spindles (12 to 14 Hz), maximal frontocentral region. EEG showed  intermittent generalized 3 to 6 Hz theta-delta slowing. Hyperventilation and photic stimulation were not performed.   ABNORMALITY - Intermittent slow, generalized IMPRESSION: This study is suggestive of mild diffuse encephalopathy, nonspecific etiology. No seizures or epileptiform discharges were seen throughout the recording. Priyanka Barbra Sarks      Scheduled Meds:  amLODipine  10 mg Oral Daily   aspirin EC  81 mg Oral Daily   cloNIDine  0.1 mg Oral BID   vitamin B-12  1,000 mcg Oral Daily   fondaparinux (ARIXTRA) injection  2.5 mg Subcutaneous Daily   hydrALAZINE  100 mg Oral Q8H   insulin aspart  0-6 Units Subcutaneous TID WC   levothyroxine  25 mcg Oral Q0600   metoprolol tartrate  50 mg Oral BID   rosuvastatin  10 mg Oral QHS   Continuous Infusions:  sodium chloride 125 mL/hr at 05/16/22 0749     LOS: 2 days     Dessa Phi, DO [facility] 05/16/2022, 1:01 PM   Available via Epic secure chat 7am-7pm After these hours, please refer to coverage provider listed on [email or web address]

## 2022-05-16 NOTE — Plan of Care (Signed)
  Problem: Health Behavior/Discharge Planning: Goal: Ability to manage health-related needs will improve Outcome: Progressing   Problem: Metabolic: Goal: Ability to maintain appropriate glucose levels will improve Outcome: Progressing   Problem: Tissue Perfusion: Goal: Adequacy of tissue perfusion will improve Outcome: Progressing   Problem: Ischemic Stroke/TIA Tissue Perfusion: Goal: Complications of ischemic stroke/TIA will be minimized Outcome: Progressing

## 2022-05-17 ENCOUNTER — Inpatient Hospital Stay (HOSPITAL_COMMUNITY): Payer: Medicare Other

## 2022-05-17 DIAGNOSIS — E871 Hypo-osmolality and hyponatremia: Secondary | ICD-10-CM | POA: Diagnosis not present

## 2022-05-17 DIAGNOSIS — E440 Moderate protein-calorie malnutrition: Secondary | ICD-10-CM | POA: Insufficient documentation

## 2022-05-17 LAB — BASIC METABOLIC PANEL
Anion gap: 11 (ref 5–15)
BUN: 14 mg/dL (ref 8–23)
CO2: 20 mmol/L — ABNORMAL LOW (ref 22–32)
Calcium: 9.7 mg/dL (ref 8.9–10.3)
Chloride: 97 mmol/L — ABNORMAL LOW (ref 98–111)
Creatinine, Ser: 1.29 mg/dL — ABNORMAL HIGH (ref 0.61–1.24)
GFR, Estimated: 54 mL/min — ABNORMAL LOW (ref >60)
Glucose, Bld: 237 mg/dL — ABNORMAL HIGH (ref 70–99)
Potassium: 3.6 mmol/L (ref 3.5–5.1)
Sodium: 128 mmol/L — ABNORMAL LOW (ref 135–145)

## 2022-05-17 LAB — GLUCOSE, CAPILLARY
Glucose-Capillary: 285 mg/dL — ABNORMAL HIGH (ref 70–99)
Glucose-Capillary: 258 mg/dL — ABNORMAL HIGH (ref 70–99)
Glucose-Capillary: 222 mg/dL — ABNORMAL HIGH (ref 70–99)
Glucose-Capillary: 213 mg/dL — ABNORMAL HIGH (ref 70–99)
Glucose-Capillary: 188 mg/dL — ABNORMAL HIGH (ref 70–99)
Glucose-Capillary: 291 mg/dL — ABNORMAL HIGH (ref 70–99)

## 2022-05-17 MED ORDER — ADULT MULTIVITAMIN W/MINERALS CH
1.0000 | ORAL_TABLET | Freq: Every day | ORAL | Status: DC
  Administered 2022-05-17 – 2022-05-19 (×3): 1 via ORAL
  Filled 2022-05-17 (×3): qty 1

## 2022-05-17 MED ORDER — METOPROLOL TARTRATE 5 MG/5ML IV SOLN
10.0000 mg | Freq: Four times a day (QID) | INTRAVENOUS | Status: DC
  Administered 2022-05-17 – 2022-05-18 (×6): 10 mg via INTRAVENOUS
  Filled 2022-05-17 (×6): qty 10

## 2022-05-17 MED ORDER — ENSURE ENLIVE PO LIQD
237.0000 mL | Freq: Three times a day (TID) | ORAL | Status: DC
  Administered 2022-05-17 – 2022-05-19 (×5): 237 mL via ORAL

## 2022-05-17 MED ORDER — INSULIN GLARGINE-YFGN 100 UNIT/ML ~~LOC~~ SOLN
10.0000 [IU] | Freq: Every day | SUBCUTANEOUS | Status: DC
  Administered 2022-05-17: 10 [IU] via SUBCUTANEOUS
  Filled 2022-05-17 (×2): qty 0.1

## 2022-05-17 NOTE — Progress Notes (Signed)
The patient's  urine is pink this morning. He's  on Arixtra medication. Notified Dr. [place] Crane and awaiting a new direction.

## 2022-05-17 NOTE — Progress Notes (Signed)
No new order received form Dr. [place] Crane. Will continue to monitor.

## 2022-05-17 NOTE — Progress Notes (Signed)
Mobility Specialist Progress Note   05/17/22 1005  Mobility  Activity Transferred from bed to chair;Stood at bedside;Dangled on edge of bed  Level of Assistance Minimal assist, patient does 75% or more  Assistive Device Front wheel walker  Distance Ambulated (ft) 6 ft  Range of Motion/Exercises Active;All extremities  Activity Response Tolerated well   Pre Mobility:  HR 80,  BP 190/90, SpO2 99% (RA) During Mobility: HR 110,SpO2 95% (RA) Post Mobility: HR 90,  BP 187/96, SpO2 99% (RA)  Patient received in supine, agreeable to participate in mobility. Presented A&Ox4 answering all questions and provided history appropriately. Initially stated he's been getting OOB everyday but determined he hasn't been up since Friday. He required mod A + max verbal and tactile cues to get EOB from supine to sit. Upon dangling EOB pt was fatigued and c/o dizziness, likely secondary to laying down and being sedentary. VSS. Dizziness slowly dissipated with extended rest while dangling. Was able to stand with min/mod A + cues for hand/foot placement. Took steps forward, backwards and alongside bed to recliner chair requiring max tactile cues for environmental navigation. Tolerated without complaint or incident but is limited by visual impairment, fatigue, and generalized weakness. Was left in recliner chair with all needs met, call bell in reach.   [place] Kacia Halley, [place], [place]  Office: [phone]

## 2022-05-17 NOTE — Progress Notes (Signed)
SLP Cancellation Note  Patient Details Name: Johnny [name] MRN: [id number] DOB: 1932-08-25   Cancelled treatment:       Reason Eval/Treat Not Completed: Patient at procedure or test/unavailable (Pt working with mobility tech and then subsequently receiving meds from [employer]. SLP will follow up later as scheduled allows.)  Kaitlyn Skowron I. Hardin Negus, [place], [place] Office number [phone]  Horton Marshall 05/17/2022, 10:53 AM

## 2022-05-17 NOTE — TOC Progression Note (Signed)
Transition of Care [facility]) - Progression Note    Patient Details  Name: Johnny [name] MRN: [id number] Date of Birth: Oct 23, 1932  Transition of Care [facility]) CM/SW [place], [place] Phone Number: 05/17/2022, 11:36 AM  Clinical Narrative:    CSW met with pt at bedside and reviewed bed offers and ratings with him. Pt notes he has been to [facility] before and would like to return. MD notified CSW pt will likely be ready near the middle of the week. Facility notified and insurance started. CSW spoke with pt's spouse who is in agreement with DC plan. TOC will continue to follow for DC needs.   Expected Discharge Plan: Skilled Nursing Facility Barriers to Discharge: Ship broker, Continued Medical Work up  Expected Discharge Plan and Services Expected Discharge Plan: [place] In-house Referral: Clinical Social Work     Living arrangements for the past 2 months: Single Family Home                                       Social Determinants of Health (SDOH) Interventions    Readmission Risk Interventions     No data to display

## 2022-05-17 NOTE — Plan of Care (Signed)
  Problem: Pain Managment: Goal: General experience of comfort will improve Outcome: Progressing   Problem: Safety: Goal: Ability to remain free from injury will improve Outcome: Progressing   Problem: Skin Integrity: Goal: Risk for impaired skin integrity will decrease Outcome: Progressing   

## 2022-05-17 NOTE — Care Management Important Message (Signed)
Important Message  Patient Details  Name: Johnny [name] MRN: [id number] Date of Birth: 04-Nov-1932   Medicare Important Message Given:  Yes     Orbie Pyo 05/17/2022, 4:05 PM

## 2022-05-17 NOTE — Progress Notes (Addendum)
Initial Nutrition Assessment  DOCUMENTATION CODES:   Non-severe (moderate) malnutrition in context of acute illness/injury  INTERVENTION:  - Add Ensure Enlive po TID, each supplement provides 350 kcal and 20 grams of protein.   - Add MVI q day.   NUTRITION DIAGNOSIS:   Moderate Malnutrition related to acute illness as evidenced by mild muscle depletion, mild fat depletion.  GOAL:   Patient will meet greater than or equal to 90% of their needs  MONITOR:   PO intake, Supplement acceptance  REASON FOR ASSESSMENT:   Malnutrition Screening Tool    ASSESSMENT:   88 y.o. male admits related to slurred speech and difficulty swallowing. PMH includes: CAD s/p CABG, aortic valve replacement, T2DM, HTN, CKD stage 3.  Meds include: Vit B12, sliding scale insulin, Semglee (10 units). Labs reviewed: Na low. FS Glucose: 179-258 mg/dL.   Pt was sitting in bedside chair at time of assessment. Pt seemed slightly confused and was a poor historian. No significant wt loss per record. RD unable to gather details about how pt was eating PTA. Pt was NPO this am for procedure. Pt did agree for Ensure TID for now. RD will continue to monitor PO intakes.   NUTRITION - FOCUSED PHYSICAL EXAM:  Flowsheet Row Most Recent Value  Orbital Region Mild depletion  Upper Arm Region Mild depletion  Thoracic and Lumbar Region Unable to assess  Buccal Region No depletion  Temple Region Mild depletion  Clavicle Bone Region No depletion  Clavicle and Acromion Bone Region No depletion  Scapular Bone Region Unable to assess  Dorsal Hand Mild depletion  Patellar Region Unable to assess  Anterior Thigh Region Unable to assess  Posterior Calf Region Unable to assess  Edema (RD Assessment) Moderate  Hair Reviewed  Eyes Reviewed  Mouth Reviewed  Skin Reviewed  Nails Reviewed       Diet Order:   Diet Order             DIET - DYS 1 Room service appropriate? Yes; Fluid consistency: Thin  Diet effective  now                   EDUCATION NEEDS:   Not appropriate for education at this time  Skin:  Skin Assessment: Skin Integrity Issues: Skin Integrity Issues:: Unstageable Unstageable: right toe  Last BM:  05/14/22  Height:   Ht Readings from Last 1 Encounters:  05/14/22 5\' 7"  (1.702 m)    Weight:   Wt Readings from Last 1 Encounters:  05/14/22 83 kg    Ideal Body Weight:  67.3 kg  BMI:  Body mass index is 28.66 kg/m.  Estimated Nutritional Needs:   Kcal:  2025-4270 kcals  Protein:  100-125 gm  Fluid:  6237-6283 mL  Thalia Bloodgood, RD, LDN, CNSC

## 2022-05-17 NOTE — Progress Notes (Signed)
Pt on dys 3 diet. Lunch tray delivered. Pt declined tray and requesting pureed food. Dr Dessa Phi made aware. See new order for dys 1 diet. Dietary called for new lunch tray.

## 2022-05-17 NOTE — Inpatient Diabetes Management (Signed)
Inpatient Diabetes Program Recommendations  AACE/ADA: New Consensus Statement on Inpatient Glycemic Control (2015)  Target Ranges:  Prepandial:   less than 140 mg/dL      Peak postprandial:   less than 180 mg/dL (1-2 hours)      Critically ill patients:  140 - 180 mg/dL    Latest Reference Range & Units 05/16/22 07:21 05/16/22 11:06 05/16/22 16:06 05/16/22 19:15  Glucose-Capillary 70 - 99 mg/dL 182 (H)  1 units Novolog  241 (H)  2 units Novolog @1238  209 (H)  2 units Novolog @1803  256 (H)  (H): Data is abnormally high  Latest Reference Range & Units 05/16/22 23:15 05/17/22 03:34 05/17/22 08:02  Glucose-Capillary 70 - 99 mg/dL 285 (H) 258 (H) 222 (H)  2 units Novolog   (H): Data is abnormally high  Admit with:  Slurred speech and difficulty swallowing  Hyponatremia  Hypertensive urgency   History: DM, CVA, CKD  Home DM Meds: Lantus 10-12 units QHS        Humalog 10-14 units TID with meals  Current Orders: Novolog 0-6 TID   MD- Note CBGs now >200.  Pt takes Lantus insulin at home.  Please consider starting Semglee 10 units QHS (lower end of home dose)    --Will follow patient during hospitalization--  Wyn Quaker RN, MSN, [place] Diabetes Coordinator Inpatient Glycemic Control Team Team Pager: [phone] (8a-5p)

## 2022-05-17 NOTE — Progress Notes (Signed)
PROGRESS NOTE    Johnny [name]  YIR:[id number] DOB: March 23, 1933 DOA: 05/13/2022 PCP: Merri Brunette, MD     Brief Narrative:  Johnny [name] is a 88 y.o. male with history of CAD status post CABG, bioprosthetic aortic valve replacement, diabetes mellitus type 2, hypertension, prior stroke, chronic kidney disease stage III, chronic nonhealing ulcer on the second toe of the right foot was found to have increasing slurred speech yesterday and was brought to the ER.  Over the last 3 days patient has been having difficulty swallowing and has not had any much to eat.  Also has been found to have some jerking spells which involves his whole body over the last 2 to 3 months.  Last evening around 7:30 PM patient was noticed to be more weaker than usual was noticed to have slurred speech and was brought to the ER.    In the ER patient was evaluated by neurologist had CT head done.  Patient's labs show hyponatremia and leukocytosis.  Patient admitted for further management for possible stroke.  On exam patient has weakness on the left upper and lower extremity.  Failed stroke swallow.  Neurology was consulted.  Work up was unrevealing for stroke or TIA. His symptoms are thought to be metabolic in nature with hyponatremia.  New events last 24 hours / Subjective: States that he has been tolerating dysphagia 1 diet without any issues yesterday.  Esophagram completed this morning showing mild dysmotility and narrowing at the GE junction.  Assessment & Plan:   Principal Problem:   Hyponatremia Active Problems:   Type 2 diabetes mellitus with stage 3b chronic kidney disease, with long-term current use of insulin (HCC)   Essential hypertension   History of CVA (cerebrovascular accident)   Aortic valve replaced   CKD (chronic kidney disease) stage 3, GFR 30-59 ml/min (HCC)   Slurred speech   Hyponatremia -Slowly improving, continue IV fluids  Slurred speech -MRI negative for acute stroke -MRA head  negative for intracranial large vessel occlusion -Echocardiogram EF 60 to 65%, grade 2 diastolic dysfunction -Carotid ultrasound negative -EEG suggestive of mild diffuse encephalopathy, no seizures or epileptiform discharges seen -Aspirin, crestor   Hypertensive urgency -Norvasc, Catapres, hydralazine, metoprolol  Vitamin B12 deficiency -Replace  Hypothyroidism -Synthroid  Diabetes mellitus -Sliding scale insulin  Dysphagia -Continue dysphagia 1 diet, tolerating well -Esophagram showed mild dysmotility and narrowing at the GE junction, mild stricture ring possible -Follow-up outpatient   Legally blind bilaterally due to macular degeneration  In agreement with assessment of the pressure ulcer as below:  Pressure Injury 05/14/22 Toe (Comment  which one) Anterior;Right Unstageable - Full thickness tissue loss in which the base of the injury is covered by slough (yellow, tan, gray, green or brown) and/or eschar (tan, brown or black) in the wound bed. diabe (Active)  05/14/22 1830  Location: Toe (Comment  which one)  Location Orientation: Anterior;Right  Staging: Unstageable - Full thickness tissue loss in which the base of the injury is covered by slough (yellow, tan, gray, green or brown) and/or eschar (tan, brown or black) in the wound bed.  Wound Description (Comments): diabetic ulcer  Present on Admission: Yes  Dressing Type None 05/17/22 0758         DVT prophylaxis:  Place and maintain sequential compression device Start: 05/17/22 0718 SCD's Start: 05/14/22 0238  Code Status: Full Family Communication: None at bedside Disposition Plan:  Status is: Inpatient Remains inpatient appropriate because: SNF placement, IVF for Hyponatremia  Antimicrobials:  Anti-infectives (From admission, onward)    None        Objective: Vitals:   05/16/22 1913 05/17/22 0333 05/17/22 0715 05/17/22 0758  BP: (!) 184/84 (!) 189/97 (!) 200/97   Pulse: 76 90 91   Resp: 20 20  19    Temp: 98 F (36.7 C) 98.4 F (36.9 C) 98.6 F (37 C)   TempSrc: Oral Oral Axillary   SpO2: 94% 95% (!) 85% 92%  Weight:      Height:        Intake/Output Summary (Last 24 hours) at 05/17/2022 1046 Last data filed at 05/17/2022 0954 Gross per 24 hour  Intake 2536.34 ml  Output 1500 ml  Net 1036.34 ml    Filed Weights   05/13/22 2158 05/14/22 1338  Weight: 83 kg 83 kg    Examination:  General exam: Appears calm and comfortable  Respiratory system: Clear to auscultation. Respiratory effort normal. No respiratory distress. No conversational dyspnea.  Cardiovascular system: S1 & S2 heard, RRR. No murmurs. No pedal edema. Gastrointestinal system: Abdomen is nondistended, soft and nontender. Normal bowel sounds heard. Central nervous system: Alert  Extremities: Symmetric in appearance  Skin: No rashes, lesions or ulcers on exposed skin  Psychiatry: Mood & affect appropriate.   Data Reviewed: I have personally reviewed following labs and imaging studies  CBC: Recent Labs  Lab 05/13/22 2159 05/13/22 2201 05/14/22 0440  WBC 13.5*  --  11.0*  NEUTROABS 10.4*  --   --   HGB 16.0 16.7 15.4  HCT 45.7 49.0 44.6  MCV 85.4  --  84.6  PLT 288  --  119    Basic Metabolic Panel: Recent Labs  Lab 05/13/22 2159 05/13/22 2201 05/14/22 0440 05/15/22 0218 05/16/22 0241 05/17/22 0300  NA 127* 125* 128* 127* 126* 128*  K 4.1 4.1 4.2 3.6 3.8 3.6  CL 95* 92* 94* 96* 96* 97*  CO2 21*  --  24 23 22  20*  GLUCOSE 153* 148* 129* 141* 182* 237*  BUN 19 19 16 14 13 14   CREATININE 1.59* 1.40* 1.49* 1.21 1.34* 1.29*  CALCIUM 10.2  --  9.8 9.8 9.4 9.7    GFR: Estimated Creatinine Clearance: 41.6 mL/min (A) (by C-G formula based on SCr of 1.29 mg/dL (H)). Liver Function Tests: Recent Labs  Lab 05/13/22 2159 05/14/22 0440  AST 29 22  ALT 25 21  ALKPHOS 66 65  BILITOT 1.0 1.0  PROT 6.9 6.2*  ALBUMIN 3.6 3.3*    No results for input(s): "LIPASE", "AMYLASE" in the last 168  hours. Recent Labs  Lab 05/14/22 1101  AMMONIA 25    Coagulation Profile: Recent Labs  Lab 05/13/22 2159  INR 1.1    Cardiac Enzymes: No results for input(s): "CKTOTAL", "CKMB", "CKMBINDEX", "TROPONINI" in the last 168 hours. BNP (last 3 results) No results for input(s): "PROBNP" in the last 8760 hours. HbA1C: No results for input(s): "HGBA1C" in the last 72 hours.  CBG: Recent Labs  Lab 05/16/22 1606 05/16/22 1915 05/16/22 2315 05/17/22 0334 05/17/22 0802  GLUCAP 209* 256* 285* 258* 222*    Lipid Profile: No results for input(s): "CHOL", "HDL", "LDLCALC", "TRIG", "CHOLHDL", "LDLDIRECT" in the last 72 hours.  Thyroid Function Tests: No results for input(s): "TSH", "T4TOTAL", "FREET4", "T3FREE", "THYROIDAB" in the last 72 hours.  Anemia Panel: No results for input(s): "VITAMINB12", "FOLATE", "FERRITIN", "TIBC", "IRON", "RETICCTPCT" in the last 72 hours.  Sepsis Labs: No results for input(s): "PROCALCITON", "LATICACIDVEN" in the last 168  hours.  No results found for this or any previous visit (from the past 240 hour(s)).    Radiology Studies: DG ESOPHAGUS W SINGLE CM (SOL OR THIN BA)  Result Date: 05/17/2022 CLINICAL DATA:  Patient admitted 05/14/22 with slurred speech, weakness and jerking movements. Patient complained of dysphagia with solids only during admission, speech evaluation at bedside unremarkable. Request for esophagram for further evaluation. EXAM: ESOPHAGUS/BARIUM SWALLOW/TABLET STUDY TECHNIQUE: Single contrast examination was performed using thin liquid barium. This exam was performed by Lynnette Caffey, PA-C, and was supervised and interpreted by Paulina Fusi, MD. FLUOROSCOPY: Radiation Exposure Index (as provided by the fluoroscopic device): 44.30 mGy Kerma COMPARISON:  None Available. FINDINGS: Swallowing: Appears normal. No vestibular penetration or aspiration seen. Pharynx: Unremarkable. Esophagus: Normal appearance. Non-occlusive narrowing at  gastroesophageal junction. Esophageal motility: Mild dysmotility. Hiatal Hernia: None. Gastroesophageal reflux: None visualized. Ingested 13 mm barium tablet: Became stuck approximately 3 cm above the GE junction. Tablet was observed for 5 minutes without significant change in position despite additional barium and water. Other: Exam performed in supine LPO due to patient weakness and difficulty positioning. IMPRESSION: Single contrast esophagram significant for mild dysmotility and narrowing at the GE junction. Barium tablet became stuck at the gastroesophageal junction. There may be mild stricture ring at this level. Electronically Signed   By: Paulina Fusi M.D.   On: 05/17/2022 10:07      Scheduled Meds:  amLODipine  10 mg Oral Daily   aspirin EC  81 mg Oral Daily   cloNIDine  0.1 mg Oral BID   vitamin B-12  1,000 mcg Oral Daily   hydrALAZINE  100 mg Oral Q8H   insulin aspart  0-6 Units Subcutaneous TID WC   insulin glargine-yfgn  10 Units Subcutaneous QHS   levothyroxine  25 mcg Oral Q0600   metoprolol tartrate  10 mg Intravenous Q6H   rosuvastatin  10 mg Oral QHS   Continuous Infusions:  sodium chloride 125 mL/hr at 05/17/22 0700     LOS: 3 days     Noralee Stain, DO [facility] 05/17/2022, 10:46 AM   Available via Epic secure chat 7am-7pm After these hours, please refer to coverage provider listed on [email or web address]

## 2022-05-18 ENCOUNTER — Inpatient Hospital Stay (HOSPITAL_COMMUNITY): Payer: Medicare Other

## 2022-05-18 DIAGNOSIS — E871 Hypo-osmolality and hyponatremia: Secondary | ICD-10-CM | POA: Diagnosis not present

## 2022-05-18 LAB — BASIC METABOLIC PANEL
Anion gap: 7 (ref 5–15)
BUN: 15 mg/dL (ref 8–23)
CO2: 21 mmol/L — ABNORMAL LOW (ref 22–32)
Calcium: 9.7 mg/dL (ref 8.9–10.3)
Chloride: 102 mmol/L (ref 98–111)
Creatinine, Ser: 1.3 mg/dL — ABNORMAL HIGH (ref 0.61–1.24)
GFR, Estimated: 53 mL/min — ABNORMAL LOW (ref >60)
Glucose, Bld: 203 mg/dL — ABNORMAL HIGH (ref 70–99)
Potassium: 3.5 mmol/L (ref 3.5–5.1)
Sodium: 130 mmol/L — ABNORMAL LOW (ref 135–145)

## 2022-05-18 LAB — GLUCOSE, CAPILLARY
Glucose-Capillary: 139 mg/dL — ABNORMAL HIGH (ref 70–99)
Glucose-Capillary: 189 mg/dL — ABNORMAL HIGH (ref 70–99)
Glucose-Capillary: 202 mg/dL — ABNORMAL HIGH (ref 70–99)
Glucose-Capillary: 214 mg/dL — ABNORMAL HIGH (ref 70–99)
Glucose-Capillary: 218 mg/dL — ABNORMAL HIGH (ref 70–99)
Glucose-Capillary: 246 mg/dL — ABNORMAL HIGH (ref 70–99)
Glucose-Capillary: 249 mg/dL — ABNORMAL HIGH (ref 70–99)

## 2022-05-18 MED ORDER — INSULIN ASPART 100 UNIT/ML IJ SOLN
0.0000 [IU] | Freq: Every day | INTRAMUSCULAR | Status: DC
  Administered 2022-05-18: 2 [IU] via SUBCUTANEOUS

## 2022-05-18 MED ORDER — METOPROLOL TARTRATE 50 MG PO TABS
50.0000 mg | ORAL_TABLET | Freq: Two times a day (BID) | ORAL | Status: DC
  Administered 2022-05-18 – 2022-05-19 (×3): 50 mg via ORAL
  Filled 2022-05-18 (×3): qty 1

## 2022-05-18 MED ORDER — CLONIDINE HCL 0.1 MG PO TABS
0.1000 mg | ORAL_TABLET | Freq: Three times a day (TID) | ORAL | Status: DC
  Administered 2022-05-18 – 2022-05-19 (×4): 0.1 mg via ORAL
  Filled 2022-05-18 (×4): qty 1

## 2022-05-18 MED ORDER — POTASSIUM CHLORIDE 20 MEQ PO PACK
40.0000 meq | PACK | Freq: Once | ORAL | Status: AC
  Administered 2022-05-18: 40 meq via ORAL
  Filled 2022-05-18: qty 2

## 2022-05-18 MED ORDER — FUROSEMIDE 10 MG/ML IJ SOLN
40.0000 mg | Freq: Once | INTRAMUSCULAR | Status: AC
  Administered 2022-05-18: 40 mg via INTRAVENOUS
  Filled 2022-05-18: qty 4

## 2022-05-18 MED ORDER — INSULIN GLARGINE-YFGN 100 UNIT/ML ~~LOC~~ SOLN
12.0000 [IU] | Freq: Every day | SUBCUTANEOUS | Status: DC
  Administered 2022-05-18: 12 [IU] via SUBCUTANEOUS
  Filled 2022-05-18 (×2): qty 0.12

## 2022-05-18 NOTE — Progress Notes (Signed)
Occupational Therapy Treatment Patient Details Name: Johnny [name] MRN: [id number] DOB: 19-Oct-1932 Today's Date: 05/18/2022   History of present illness 88 yo male was admitted with slurred speech and swallowing difficulties on 11/2, now found to have some jerking movements and recent full body jerking over last 3 months.  Now suddenly is much weaker as well.  PMHx:  strokes, DM, HTN, aortic stenosis, valve replacement, macular degeneration with full vision loss, CABG, gout, dyslipidemia, CKD3b,   OT comments  OT ADL retraining session with focus on grooming in sitting, and sit to stand x3 in preparaton for increased participation in ADL's and functional mobility. Pt was Mod A sit to stand from chair initially, progressing to Min A (3 reps total); pt able to follow commands with cues and orientation to setting secondary to vision imapirments as well as increased time for initiation. Grooming completed with Min A in sitting. Plan of care is appropriate, cont to rec SNF.   Recommendations for follow up therapy are one component of a multi-disciplinary discharge planning process, led by the attending physician.  Recommendations may be updated based on patient status, additional functional criteria and insurance authorization.    Follow Up Recommendations  Skilled nursing-short term rehab (<3 hours/day)    Assistance Recommended at Discharge    Patient can return home with the following  A lot of help with walking and/or transfers;A lot of help with bathing/dressing/bathroom   Equipment Recommendations  Other (comment) (Defer to next venue)    Recommendations for Other Services      Precautions / Restrictions Precautions Precautions: Fall Precaution Comments: blind Restrictions Weight Bearing Restrictions: No       Mobility Bed Mobility Overal bed mobility:  (Pt received up in chair for OT)   Transfers Overall transfer level: Needs assistance Equipment used: Rolling walker (2  wheels) Transfers: Sit to/from Stand Sit to Stand: Min assist, Mod assist   General transfer comment: steadying assist with posterior bias     Balance Overall balance assessment: Needs assistance Sitting-balance support: No upper extremity supported, Feet supported Sitting balance-Leahy Scale: Fair     Standing balance support: Bilateral upper extremity supported, During functional activity, Reliant on assistive device for balance Standing balance-Leahy Scale: Poor Standing balance comment: heavy reliance of BUE on RW       ADL either performed or assessed with clinical judgement   ADL Overall ADL's : Needs assistance/impaired   Grooming: Wash/dry hands;Wash/dry face;Oral care;Brushing hair;Minimal assistance;Sitting   Functional mobility during ADLs: Rolling walker (2 wheels);Minimal assistance;Moderate assistance General ADL Comments: Unsteady in standing with posterior bias. OT ADL retraining session with focus on grooming in sitting, and sit to stand x3 in preparaton for increased participation in ADL's and functional mobility. Pt was Mod A sit to stand from chair initially, progressing to Min A (3 reps total); pt able to follow commands with cues and orientation to setting secondary to vision imapirments as well as increased time for initiation. Grooming completed with Min A in sitting. Plan of care is appropriate.    Extremity/Trunk Assessment Upper Extremity Assessment Upper Extremity Assessment: Generalized weakness   Lower Extremity Assessment Lower Extremity Assessment: Defer to PT evaluation;Generalized weakness        Vision Baseline Vision/History: 2 Legally blind;6 Macular Degeneration Ability to See in Adequate Light: 4 Severely impaired Patient Visual Report: No change from baseline Vision Assessment?: Vision impaired- to be further tested in functional context   Perception     Praxis  Cognition Arousal/Alertness: Awake/alert Behavior During  Therapy: WFL for tasks assessed/performed Overall Cognitive Status: No family/caregiver present to determine baseline cognitive functioning         Pertinent Vitals/ Pain       Pain Assessment Pain Assessment: No/denies pain Faces Pain Scale: No hurt  Home Living  Live with spouse, refer to initial OT assessment for home living details    Prior Functioning/Environment  Assist from family for ADL's and mobility per pt report, refer to initial OT eval for details on PLOF   Frequency  Min 2X/week        Progress Toward Goals  OT Goals(current goals can now be found in the care plan section)  Progress towards OT goals: Progressing toward goals  Acute Rehab OT Goals Patient Stated Goal: Agreeable to SNF Rehab OT Goal Formulation: With patient Time For Goal Achievement: 05/28/22 Potential to Achieve Goals: Good  Plan Discharge plan remains appropriate       AM-PAC OT "6 Clicks" Daily Activity     Outcome Measure   Help from another person eating meals?: A Little Help from another person taking care of personal grooming?: A Little Help from another person toileting, which includes using toliet, bedpan, or urinal?: A Lot Help from another person bathing (including washing, rinsing, drying)?: A Lot Help from another person to put on and taking off regular upper body clothing?: A Little Help from another person to put on and taking off regular lower body clothing?: A Lot 6 Click Score: 15    End of Session Equipment Utilized During Treatment: Gait belt;Rolling walker (2 wheels)  OT Visit Diagnosis: Unsteadiness on feet (R26.81);Other abnormalities of gait and mobility (R26.89)   Activity Tolerance Patient tolerated treatment well   Patient Left in chair;with call bell/phone within reach;with chair alarm set   Nurse Communication Mobility status        Time: 3545-6256 OT Time Calculation (min): 33 min  Charges: OT General Charges $OT Visit: 1 Visit OT  Treatments $Self Care/Home Management : 8-22 mins $Therapeutic Activity: 8-22 mins   Curtisha Bendix Beth Dixon, OTR/L 05/18/2022, 10:55 AM

## 2022-05-18 NOTE — Progress Notes (Signed)
PROGRESS NOTE    PASHA BROAD  WEX:[id number] DOB: February 21, 1933 DOA: 05/13/2022 PCP: Merri Brunette, MD     Brief Narrative:  Johnny [name] is a 88 y.o. male with history of CAD status post CABG, bioprosthetic aortic valve replacement, diabetes mellitus type 2, hypertension, prior stroke, chronic kidney disease stage III, chronic nonhealing ulcer on the second toe of the right foot was found to have increasing slurred speech yesterday and was brought to the ER.  Over the last 3 days patient has been having difficulty swallowing and has not had any much to eat.  Also has been found to have some jerking spells which involves his whole body over the last 2 to 3 months.  Last evening around 7:30 PM patient was noticed to be more weaker than usual was noticed to have slurred speech and was brought to the ER.    In the ER patient was evaluated by neurologist had CT head done.  Patient's labs show hyponatremia and leukocytosis.  Patient admitted for further management for possible stroke.  On exam patient has weakness on the left upper and lower extremity.  Failed stroke swallow.  Neurology was consulted.  Work up was unrevealing for stroke or TIA. His symptoms are thought to be metabolic in nature with hyponatremia.  New events last 24 hours / Subjective: Overnight, patient was found to have increase in respiratory rate.  Chest x-ray was obtained and IV fluid discontinued.  Chest x-ray did reveal evidence of pulmonary edema.  Patient was sitting in a recliner this morning.  Has been up with physical therapy.  Denies worsening shortness of breath on my examination.  Has been tolerating pured diet.  Assessment & Plan:   Principal Problem:   Hyponatremia Active Problems:   Type 2 diabetes mellitus with stage 3b chronic kidney disease, with long-term current use of insulin (HCC)   Essential hypertension   History of CVA (cerebrovascular accident)   Aortic valve replaced   CKD (chronic kidney  disease) stage 3, GFR 30-59 ml/min (HCC)   Slurred speech   Malnutrition of moderate degree   Hyponatremia -Has been improving with IV fluids.  IV fluid now discontinued due to pulmonary edema.  Acute on chronic diastolic heart failure -EF 60 to 65%, grade 2 diastolic dysfunction by echo 05/14/2022 -Has been getting IV fluid due to hyponatremia.  IV fluid has been stopped -IV Lasix given today.  Monitor  Slurred speech -MRI negative for acute stroke -MRA head negative for intracranial large vessel occlusion -Echocardiogram EF 60 to 65%, grade 2 diastolic dysfunction -Carotid ultrasound negative -EEG suggestive of mild diffuse encephalopathy, no seizures or epileptiform discharges seen -Aspirin, crestor   Hypertensive urgency -Norvasc, Catapres, hydralazine, metoprolol  Vitamin B12 deficiency -Replace  Hypothyroidism -Synthroid  Diabetes mellitus -Sliding scale insulin  Dysphagia -Continue dysphagia 1 diet, tolerating well -Esophagram showed mild dysmotility and narrowing at the GE junction, mild stricture ring possible -Follow-up outpatient  CKD stage IIIa -Baseline creatinine 1.2-1.4 -Stable  Hypokalemia -Replace  Legally blind bilaterally due to macular degeneration  In agreement with assessment of the pressure ulcer as below:  Pressure Injury 05/14/22 Toe (Comment  which one) Anterior;Right Unstageable - Full thickness tissue loss in which the base of the injury is covered by slough (yellow, tan, gray, green or brown) and/or eschar (tan, brown or black) in the wound bed. diabe (Active)  05/14/22 1830  Location: Toe (Comment  which one)  Location Orientation: Anterior;Right  Staging: Unstageable - Full thickness tissue  loss in which the base of the injury is covered by slough (yellow, tan, gray, green or brown) and/or eschar (tan, brown or black) in the wound bed.  Wound Description (Comments): diabetic ulcer  Present on Admission: Yes  Dressing Type None  05/18/22 0743     Nutrition Problem: Moderate Malnutrition Etiology: acute illness   DVT prophylaxis:  Place and maintain sequential compression device Start: 05/17/22 0718 SCD's Start: 05/14/22 0238  Code Status: Full Family Communication: None at bedside Disposition Plan:  Status is: Inpatient Remains inpatient appropriate because: SNF placement, IVF for Hyponatremia    Antimicrobials:  Anti-infectives (From admission, onward)    None        Objective: Vitals:   05/18/22 0349 05/18/22 0536 05/18/22 0812 05/18/22 1206  BP: (!) 198/93 (!) 192/92 (!) 182/99 (!) 179/95  Pulse: (!) 101 93 90 84  Resp: 20 (!) 30 17 17   Temp: 97.9 F (36.6 C) 98.1 F (36.7 C) (!) 97.4 F (36.3 C) 98.4 F (36.9 C)  TempSrc: Oral Oral Oral Oral  SpO2: 94% 99% 93% 93%  Weight:      Height:        Intake/Output Summary (Last 24 hours) at 05/18/2022 1252 Last data filed at 05/18/2022 0600 Gross per 24 hour  Intake 2773.15 ml  Output 175 ml  Net 2598.15 ml    Filed Weights   05/13/22 2158 05/14/22 1338  Weight: 83 kg 83 kg    Examination:  General exam: Appears calm and comfortable  Respiratory system: Bibasilar crackles, respiratory effort is normal Cardiovascular system: S1 & S2 heard, RRR. No murmurs. No pedal edema. Gastrointestinal system: Abdomen is nondistended, soft and nontender. Normal bowel sounds heard. Central nervous system: Alert  Extremities: Symmetric in appearance  Skin: No rashes, lesions or ulcers on exposed skin  Psychiatry: Mood & affect appropriate.   Data Reviewed: I have personally reviewed following labs and imaging studies  CBC: Recent Labs  Lab 05/13/22 2159 05/13/22 2201 05/14/22 0440  WBC 13.5*  --  11.0*  NEUTROABS 10.4*  --   --   HGB 16.0 16.7 15.4  HCT 45.7 49.0 44.6  MCV 85.4  --  84.6  PLT 288  --  852    Basic Metabolic Panel: Recent Labs  Lab 05/14/22 0440 05/15/22 0218 05/16/22 0241 05/17/22 0300 05/18/22 0333  NA  128* 127* 126* 128* 130*  K 4.2 3.6 3.8 3.6 3.5  CL 94* 96* 96* 97* 102  CO2 24 23 22  20* 21*  GLUCOSE 129* 141* 182* 237* 203*  BUN 16 14 13 14 15   CREATININE 1.49* 1.21 1.34* 1.29* 1.30*  CALCIUM 9.8 9.8 9.4 9.7 9.7    GFR: Estimated Creatinine Clearance: 41.3 mL/min (A) (by C-G formula based on SCr of 1.3 mg/dL (H)). Liver Function Tests: Recent Labs  Lab 05/13/22 2159 05/14/22 0440  AST 29 22  ALT 25 21  ALKPHOS 66 65  BILITOT 1.0 1.0  PROT 6.9 6.2*  ALBUMIN 3.6 3.3*    No results for input(s): "LIPASE", "AMYLASE" in the last 168 hours. Recent Labs  Lab 05/14/22 1101  AMMONIA 25    Coagulation Profile: Recent Labs  Lab 05/13/22 2159  INR 1.1    Cardiac Enzymes: No results for input(s): "CKTOTAL", "CKMB", "CKMBINDEX", "TROPONINI" in the last 168 hours. BNP (last 3 results) No results for input(s): "PROBNP" in the last 8760 hours. HbA1C: No results for input(s): "HGBA1C" in the last 72 hours.  CBG: Recent Labs  Lab  05/17/22 1950 05/18/22 0007 05/18/22 0346 05/18/22 0813 05/18/22 1208  GLUCAP 291* 249* 218* 189* 214*    Lipid Profile: No results for input(s): "CHOL", "HDL", "LDLCALC", "TRIG", "CHOLHDL", "LDLDIRECT" in the last 72 hours.  Thyroid Function Tests: No results for input(s): "TSH", "T4TOTAL", "FREET4", "T3FREE", "THYROIDAB" in the last 72 hours.  Anemia Panel: No results for input(s): "VITAMINB12", "FOLATE", "FERRITIN", "TIBC", "IRON", "RETICCTPCT" in the last 72 hours.  Sepsis Labs: No results for input(s): "PROCALCITON", "LATICACIDVEN" in the last 168 hours.  No results found for this or any previous visit (from the past 240 hour(s)).    Radiology Studies: DG CHEST PORT 1 VIEW  Result Date: 05/18/2022 CLINICAL DATA:  88 year old male with fluid overload and shortness of breath. EXAM: PORTABLE CHEST 1 VIEW COMPARISON:  Portable chest 05/14/2022 and earlier. FINDINGS: Portable AP upright view at 0651 hours. Similar lordotic  positioning. Stable cardiac and mediastinal contours with up to mild cardiomegaly. Prior sternotomy and cardiac valve replacement. Increased bilateral pulmonary interstitial opacity, and new veiling opacity at both lung bases suggesting small effusions. No pneumothorax or consolidation. Stable visualized osseous structures. Negative visible bowel gas pattern. IMPRESSION: Progressed pulmonary edema and small pleural effusions since 05/14/2022. Electronically Signed   By: Odessa Fleming M.D.   On: 05/18/2022 07:05   DG ESOPHAGUS W SINGLE CM (SOL OR THIN BA)  Result Date: 05/17/2022 CLINICAL DATA:  Patient admitted 05/14/22 with slurred speech, weakness and jerking movements. Patient complained of dysphagia with solids only during admission, speech evaluation at bedside unremarkable. Request for esophagram for further evaluation. EXAM: ESOPHAGUS/BARIUM SWALLOW/TABLET STUDY TECHNIQUE: Single contrast examination was performed using thin liquid barium. This exam was performed by Lynnette Caffey, PA-C, and was supervised and interpreted by Paulina Fusi, MD. FLUOROSCOPY: Radiation Exposure Index (as provided by the fluoroscopic device): 44.30 mGy Kerma COMPARISON:  None Available. FINDINGS: Swallowing: Appears normal. No vestibular penetration or aspiration seen. Pharynx: Unremarkable. Esophagus: Normal appearance. Non-occlusive narrowing at gastroesophageal junction. Esophageal motility: Mild dysmotility. Hiatal Hernia: None. Gastroesophageal reflux: None visualized. Ingested 13 mm barium tablet: Became stuck approximately 3 cm above the GE junction. Tablet was observed for 5 minutes without significant change in position despite additional barium and water. Other: Exam performed in supine LPO due to patient weakness and difficulty positioning. IMPRESSION: Single contrast esophagram significant for mild dysmotility and narrowing at the GE junction. Barium tablet became stuck at the gastroesophageal junction. There may be  mild stricture ring at this level. Electronically Signed   By: Paulina Fusi M.D.   On: 05/17/2022 10:07      Scheduled Meds:  amLODipine  10 mg Oral Daily   aspirin EC  81 mg Oral Daily   cloNIDine  0.1 mg Oral BID   vitamin B-12  1,000 mcg Oral Daily   feeding supplement  237 mL Oral TID BM   hydrALAZINE  100 mg Oral Q8H   insulin aspart  0-6 Units Subcutaneous TID WC   insulin glargine-yfgn  10 Units Subcutaneous QHS   levothyroxine  25 mcg Oral Q0600   metoprolol tartrate  10 mg Intravenous Q6H   multivitamin with minerals  1 tablet Oral Daily   rosuvastatin  10 mg Oral QHS   Continuous Infusions:     LOS: 4 days     Noralee Stain, DO [facility] 05/18/2022, 12:52 PM   Available via Epic secure chat 7am-7pm After these hours, please refer to coverage provider listed on [email or web address]

## 2022-05-18 NOTE — TOC Progression Note (Signed)
Transition of Care [facility]) - Progression Note    Patient Details  Name: Johnny [name] MRN: [id number] Date of Birth: 02-09-1933  [facility] CM/SW [place], [place] Phone Number: 05/18/2022, 11:09 AM  Clinical Narrative:     CSW notified by CM that insurance is requesting updated clinicals, CM sent in updated PT note, authorization pending review of note. [facility] advising they will have a bed for pt tomorrow, if medically stable. TOC will continue to follow for DC needs.  Expected Discharge Plan: Skilled Nursing Facility Barriers to Discharge: Ship broker, Continued Medical Work up  Expected Discharge Plan and Services Expected Discharge Plan: [place] In-house Referral: Clinical Social Work     Living arrangements for the past 2 months: Single Family Home                                       Social Determinants of Health (SDOH) Interventions    Readmission Risk Interventions     No data to display

## 2022-05-18 NOTE — Progress Notes (Signed)
   05/18/22 0536  Assess: MEWS Score  Temp 98.1 F (36.7 C)  BP (!) 192/92  MAP (mmHg) 123  Pulse Rate 93  Resp (!) 30  SpO2 99 %  O2 Device Nasal Cannula  O2 Flow Rate (L/min) 2 L/min  Assess: MEWS Score  MEWS Temp 0  MEWS Systolic 0  MEWS Pulse 0  MEWS RR 2  MEWS LOC 0  MEWS Score 2  MEWS Score Color Yellow  Assess: if the MEWS score is Yellow or Red  Were vital signs taken at a resting state? Yes  Focused Assessment Change from prior assessment (see assessment flowsheet)  Does the patient meet 2 or more of the SIRS criteria? No  MEWS guidelines implemented *See Row Information* Yes  Treat  MEWS Interventions Escalated (See documentation below)  Pain Scale 0-10  Pain Score 0  Take Vital Signs  Increase Vital Sign Frequency  Yellow: Q 2hr X 2 then Q 4hr X 2, if remains yellow, continue Q 4hrs  Escalate  MEWS: Escalate Yellow: discuss with charge nurse/RN and consider discussing with provider and RRT  Notify: Charge Nurse/RN  Name of Charge Nurse/RN Notified [place] RN  Date Charge Nurse/RN Notified 05/18/22  Time Charge Nurse/RN Notified 0549  Notify: Provider  Provider Name/Title Dr. [place] Crane  Date Provider Notified 05/18/22  Time Provider Notified 0540  Method of Notification Page  Notification Reason Change in status  Provider response See new orders  Date of Provider Response 05/18/22  Time of Provider Response [phone]  Document  Patient Outcome Other (Comment) (New orders to be implemented)  Progress note created (see row info) Yes  Assess: SIRS CRITERIA  SIRS Temperature  0  SIRS Pulse 1  SIRS Respirations  1  SIRS WBC 1  SIRS Score Sum  3   The patient  belly breathing with respirations of 30. Lung sounds are diminished. NS at 125 cc/hr paused. Notified Dr. [place] Crane and received new orders for CXR and the NS is D/c.

## 2022-05-18 NOTE — Inpatient Diabetes Management (Addendum)
Inpatient Diabetes Program Recommendations  AACE/ADA: New Consensus Statement on Inpatient Glycemic Control   Target Ranges:  Prepandial:   less than 140 mg/dL      Peak postprandial:   less than 180 mg/dL (1-2 hours)      Critically ill patients:  140 - 180 mg/dL    Latest Reference Range & Units 05/18/22 00:07 05/18/22 03:46 05/18/22 08:13 05/18/22 12:08  Glucose-Capillary 70 - 99 mg/dL 249 (H) 218 (H) 189 (H) 214 (H)    Review of Glycemic Control  Diabetes history: DM2 Outpatient Diabetes medications: Lantus 10-12 units QHS, Humalog 10-14 units TID with meals Current orders for Inpatient glycemic control: Semglee 10 units QHS, Novolog 0-6 units TID with meals  Inpatient Diabetes Program Recommendations:    Insulin: Please consider adding Novolog 0-5 units QHS for bedtime correction, and consider increasing Semglee to 12 units QHS.  Thanks, Barnie Alderman, RN, MSN, [place] Diabetes Coordinator Inpatient Diabetes Program [phone] (Team Pager from 8am to [place])

## 2022-05-18 NOTE — Progress Notes (Signed)
Physical Therapy Treatment Patient Details Name: Johnny [name] MRN: [id number] DOB: Nov 27, 1932 Today's Date: 05/18/2022   History of Present Illness 88 yo male was admitted with slurred speech and swallowing difficulties on 11/2, now found to have some jerking movements and recent full body jerking over last 3 months.  Now suddenly is much weaker as well.  PMHx:  strokes, DM, HTN, aortic stenosis, valve replacement, macular degeneration with full vision loss, CABG, gout, dyslipidemia, CKD3b,    PT Comments    Pt agreeable to session with progress towards acute goals. Pt needing mod-max verbal and hand over hand tactile cues throughout session for environmental awareness secondary to vision deficits during all bed mobility and transfers. Pt requiring mod physical assist for bed mobility and to transfer to standing secondary to weakness and lateral/posterior lean in sitting and standing needing assist and increased time to correct. Pt with continued jerking movements intermittently during mobility. Pt continues to be limited by weakness, impaired balance and general fatigue. Current plan remains appropriate to address deficits and maximize functional independence and decrease caregiver burden. Pt continues to benefit from skilled PT services to progress toward functional mobility goals.     Recommendations for follow up therapy are one component of a multi-disciplinary discharge planning process, led by the attending physician.  Recommendations may be updated based on patient status, additional functional criteria and insurance authorization.  Follow Up Recommendations  Skilled nursing-short term rehab (<3 hours/day) Can patient physically be transported by private vehicle: No   Assistance Recommended at Discharge Frequent or constant Supervision/Assistance  Patient can return home with the following A lot of help with walking and/or transfers;A little help with  bathing/dressing/bathroom;Assistance with cooking/housework;Direct supervision/assist for medications management;Direct supervision/assist for financial management;Assist for transportation;Help with stairs or ramp for entrance   Equipment Recommendations  None recommended by PT    Recommendations for Other Services       Precautions / Restrictions Precautions Precautions: Fall Precaution Comments: blind Restrictions Weight Bearing Restrictions: No     Mobility  Bed Mobility Overal bed mobility: Needs Assistance Bed Mobility: Supine to Sit     Supine to sit: HOB elevated, Mod assist     General bed mobility comments: mod assist to elevate trunk and to steady with step bvy step and hand over hand cues to use rail secondary to vision deficits    Transfers Overall transfer level: Needs assistance Equipment used: Rolling walker (2 wheels) Transfers: Sit to/from Stand Sit to Stand: Min assist           General transfer comment: steadying assist with posterior bias    Ambulation/Gait Ambulation/Gait assistance: Mod assist Gait Distance (Feet): 3 Feet Assistive device: Rolling walker (2 wheels), 1 person hand held assist Gait Pattern/deviations: Step-to pattern, Decreased stride length Gait velocity: unstable reduced   Pre-gait activities: standing marching x60 seconds General Gait Details: pt has lateral and posterior instability with brief moments of jerking in standing, resolved to sidestep a bit, mod asssit to steady  and advance RW   Stairs             Wheelchair Mobility    Modified Rankin (Stroke Patients Only)       Balance Overall balance assessment: Needs assistance Sitting-balance support: No upper extremity supported, Feet supported Sitting balance-Leahy Scale: Fair     Standing balance support: Bilateral upper extremity supported, During functional activity Standing balance-Leahy Scale: Poor Standing balance comment: heavy reliance of  BUE on RW  Cognition Arousal/Alertness: Awake/alert Behavior During Therapy: WFL for tasks assessed/performed Overall Cognitive Status: No family/caregiver present to determine baseline cognitive functioning                                          Exercises      General Comments        Pertinent Vitals/Pain Pain Assessment Pain Assessment: Faces Faces Pain Scale: Hurts a little bit Pain Location: general Pain Descriptors / Indicators: Aching Pain Intervention(s): Monitored during session, Limited activity within patient's tolerance, Repositioned    Home Living                          Prior Function            PT Goals (current goals can now be found in the care plan section) Acute Rehab PT Goals PT Goal Formulation: With patient/family Time For Goal Achievement: 05/28/22    Frequency    Min 3X/week      PT Plan      Co-evaluation              AM-PAC PT "6 Clicks" Mobility   Outcome Measure  Help needed turning from your back to your side while in a flat bed without using bedrails?: A Lot Help needed moving from lying on your back to sitting on the side of a flat bed without using bedrails?: A Lot Help needed moving to and from a bed to a chair (including a wheelchair)?: A Lot Help needed standing up from a chair using your arms (e.g., wheelchair or bedside chair)?: A Lot Help needed to walk in hospital room?: A Lot Help needed climbing 3-5 steps with a railing? : Total 6 Click Score: 11    End of Session Equipment Utilized During Treatment: Gait belt Activity Tolerance: Patient limited by fatigue Patient left: with call bell/phone within reach;in chair;with chair alarm set Nurse Communication: Mobility status PT Visit Diagnosis: Unsteadiness on feet (R26.81);Muscle weakness (generalized) (M62.81);Difficulty in walking, not elsewhere classified (R26.2);History of falling (Z91.81)      Time: 8295-6213 PT Time Calculation (min) (ACUTE ONLY): 34 min  Charges:  $Gait Training: 8-22 mins $Therapeutic Activity: 8-22 mins                     Kyrstyn Greear R. PTA Acute Rehabilitation Services Office: [phone]    Catalina Antigua 05/18/2022, 10:41 AM

## 2022-05-18 NOTE — Progress Notes (Signed)
Mobility Specialist: Progress Note   05/18/22 1657  Mobility  Activity Ambulated with assistance in room  Level of Assistance Moderate assist, patient does 50-74%  Assistive Device Front wheel walker  Distance Ambulated (ft) 10 ft (2'+8')  Activity Response Tolerated well  Mobility Referral Yes  $Mobility charge 1 Mobility   During Mobility: 102 HR, 87-96% SpO2 Post-Mobility: 93 HR, 167/82 (91) BP, 97% SpO2  Pt received in the chair and agreeable to mobility. ModA to scoot forward as well as to stand. Transferred to the bed, x1 seated break. Pt agreeable to second bout and ambulatd 4' forward and 4' backwards to the bed. C/o mild SOB during session, otherwise asymptomatic. Pt back in bed with call bell and phone in reach. Bed alarm is on.   [place] Johnny [name] Mobility Specialist Secure Chat Only

## 2022-05-19 ENCOUNTER — Ambulatory Visit: Payer: Medicare Other | Admitting: Podiatry

## 2022-05-19 ENCOUNTER — Encounter: Payer: Medicare Other | Admitting: Podiatry

## 2022-05-19 LAB — GLUCOSE, CAPILLARY
Glucose-Capillary: 135 mg/dL — ABNORMAL HIGH (ref 70–99)
Glucose-Capillary: 239 mg/dL — ABNORMAL HIGH (ref 70–99)

## 2022-05-19 LAB — BASIC METABOLIC PANEL
Anion gap: 9 (ref 5–15)
BUN: 18 mg/dL (ref 8–23)
CO2: 20 mmol/L — ABNORMAL LOW (ref 22–32)
Calcium: 10.2 mg/dL (ref 8.9–10.3)
Chloride: 102 mmol/L (ref 98–111)
Creatinine, Ser: 1.31 mg/dL — ABNORMAL HIGH (ref 0.61–1.24)
GFR, Estimated: 53 mL/min — ABNORMAL LOW (ref >60)
Glucose, Bld: 118 mg/dL — ABNORMAL HIGH (ref 70–99)
Potassium: 4.2 mmol/L (ref 3.5–5.1)
Sodium: 131 mmol/L — ABNORMAL LOW (ref 135–145)

## 2022-05-19 LAB — MAGNESIUM: Magnesium: 1.9 mg/dL (ref 1.7–2.4)

## 2022-05-19 MED ORDER — ENSURE ENLIVE PO LIQD: 237.0000 mL | Freq: Three times a day (TID) | ORAL | 12 refills | Status: AC

## 2022-05-19 MED ORDER — FUROSEMIDE 40 MG PO TABS: 20.0000 mg | ORAL_TABLET | Freq: Every day | ORAL | 0 refills | Status: AC

## 2022-05-19 MED ORDER — INSULIN GLARGINE 100 UNIT/ML ~~LOC~~ SOLN: 10.0000 [IU] | Freq: Every day | SUBCUTANEOUS | Status: AC

## 2022-05-19 MED ORDER — ADULT MULTIVITAMIN W/MINERALS CH: 1.0000 | ORAL_TABLET | Freq: Every day | ORAL | Status: AC

## 2022-05-19 MED ORDER — CYANOCOBALAMIN 1000 MCG PO TABS: 1000.0000 ug | ORAL_TABLET | Freq: Every day | ORAL | Status: AC

## 2022-05-19 MED ORDER — FUROSEMIDE 10 MG/ML IJ SOLN
40.0000 mg | Freq: Once | INTRAMUSCULAR | Status: AC
  Administered 2022-05-19: 40 mg via INTRAVENOUS
  Filled 2022-05-19: qty 4

## 2022-05-19 MED ORDER — CLONIDINE HCL 0.1 MG PO TABS: 0.1000 mg | ORAL_TABLET | Freq: Three times a day (TID) | ORAL | 11 refills | Status: AC

## 2022-05-19 NOTE — Discharge Summary (Signed)
[facility]  Physician Discharge Summary   Patient ID: Johnny [name] MRN: [id number] DOB/AGE: 12-17-1932 88 y.o.  Admit date: 05/13/2022 Discharge date:   05/19/2022   PCP: Deland Pretty, MD  DISCHARGE DIAGNOSES:    Hyponatremia   Type 2 diabetes mellitus with stage 3b chronic kidney disease, with long-term current use of insulin (HCC)   Essential hypertension   History of CVA (cerebrovascular accident)   Aortic valve replaced   CKD (chronic kidney disease) stage 3, GFR 30-59 ml/min (HCC)   Slurred speech   Malnutrition of moderate degree   RECOMMENDATIONS FOR OUTPATIENT FOLLOW UP: Speech therapy to continue to follow at skilled nursing facility for dysphagia Please check CBC and basic metabolic panel in 3-4 days   Home Health: SNF Equipment/Devices: None  CODE STATUS: Full code  DISCHARGE CONDITION: fair  Diet recommendation: Dysphagia 1 diet with thin liquids  INITIAL HISTORY: 88 y.o. male with history of CAD status post CABG, bioprosthetic aortic valve replacement, diabetes mellitus type 2, hypertension, prior stroke, chronic kidney disease stage III, chronic nonhealing ulcer on the second toe of the right foot was found to have increasing slurred speech yesterday and was brought to the ER.  Over the last 3 days patient has been having difficulty swallowing and has not had any much to eat.  Also has been found to have some jerking spells which involves his whole body over the last 2 to 3 months.  Last evening around 7:30 PM patient was noticed to be more weaker than usual was noticed to have slurred speech and was brought to the ER.    In the ER patient was evaluated by neurologist had CT head done.  Patient's labs show hyponatremia and leukocytosis.  Patient admitted for further management for possible stroke.  On exam patient has weakness on the left upper and lower extremity.  Failed stroke swallow.  Neurology was consulted.   Work up was unrevealing for  stroke or TIA. His symptoms are thought to be metabolic in nature with hyponatremia.   HOSPITAL COURSE:   Hyponatremia Etiology unclear.  Was given IV fluids as there was some concern for hypovolemia.  Sodium level started improving.  Will recommend levels be checked at skilled nursing facility in a few days.  Acute on chronic diastolic heart failure -EF 60 to 123456, grade 2 diastolic dysfunction by echo 05/14/2022 Developed pulmonary edema while getting IV fluids for hyponatremia.  Was given furosemide with improvement.  Hopefully can be weaned off of his oxygen prior to discharge.  We will give another dose of furosemide intravenously today and put him on 20 mg of furosemide orally daily.   Slurred speech -MRI negative for acute stroke -MRA head negative for intracranial large vessel occlusion -Echocardiogram EF 60 to 123456, grade 2 diastolic dysfunction -Carotid ultrasound negative -EEG suggestive of mild diffuse encephalopathy, no seizures or epileptiform discharges seen -Aspirin, crestor  Etiology thought to be metabolic.   Uncontrolled hypertension -Norvasc, Catapres, hydralazine, metoprolol Dose of clonidine has been increased to 3 times a day.  Furosemide has also been added.  May need further adjustment of his medication dosage depending on blood pressure readings.   Vitamin B12 deficiency -Replace   Hypothyroidism -Synthroid   Diabetes mellitus -Sliding scale insulin   Dysphagia -Continue dysphagia 1 diet, tolerating well -Esophagram showed mild dysmotility and narrowing at the GE junction, mild stricture ring possible -Speech therapy continue to follow at skilled nursing facility.   CKD stage IIIa -Baseline creatinine 1.2-1.4 -Stable  Hypokalemia -Replace   Legally blind bilaterally due to macular degeneration   In agreement with assessment of the pressure ulcer as below:      Pressure Injury 05/14/22 Toe (Comment  which one) Anterior;Right Unstageable - Full  thickness tissue loss in which the base of the injury is covered by slough (yellow, tan, gray, green or brown) and/or eschar (tan, brown or black) in the wound bed. diabe (Active)  05/14/22 1830  Location: Toe (Comment  which one)  Location Orientation: Anterior;Right  Staging: Unstageable - Full thickness tissue loss in which the base of the injury is covered by slough (yellow, tan, gray, green or brown) and/or eschar (tan, brown or black) in the wound bed.  Wound Description (Comments): diabetic ulcer  Present on Admission: Yes  Dressing Type None 05/18/22 0743      Moderate protein calorie malnutrition Nutrition Problem: Moderate Malnutrition Etiology: acute illness  Patient is stable.  Okay for discharge to SNF this afternoon.   PERTINENT LABS:  The results of significant diagnostics from this hospitalization (including imaging, microbiology, ancillary and laboratory) are listed below for reference.     Labs:   Basic Metabolic Panel: Recent Labs  Lab 05/15/22 0218 05/16/22 0241 05/17/22 0300 05/18/22 0333 05/19/22 0450  NA 127* 126* 128* 130* 131*  K 3.6 3.8 3.6 3.5 4.2  CL 96* 96* 97* 102 102  CO2 23 22 20* 21* 20*  GLUCOSE 141* 182* 237* 203* 118*  BUN 14 13 14 15 18   CREATININE 1.21 1.34* 1.29* 1.30* 1.31*  CALCIUM 9.8 9.4 9.7 9.7 10.2  MG  --   --   --   --  1.9   Liver Function Tests: Recent Labs  Lab 05/13/22 2159 05/14/22 0440  AST 29 22  ALT 25 21  ALKPHOS 66 65  BILITOT 1.0 1.0  PROT 6.9 6.2*  ALBUMIN 3.6 3.3*    Recent Labs  Lab 05/14/22 1101  AMMONIA 25   CBC: Recent Labs  Lab 05/13/22 2159 05/13/22 2201 05/14/22 0440  WBC 13.5*  --  11.0*  NEUTROABS 10.4*  --   --   HGB 16.0 16.7 15.4  HCT 45.7 49.0 44.6  MCV 85.4  --  84.6  PLT 288  --  261    CBG: Recent Labs  Lab 05/18/22 1208 05/18/22 1647 05/18/22 1939 05/18/22 2225 05/19/22 0626  GLUCAP 214* 246* 139* 202* 135*     IMAGING STUDIES DG CHEST PORT 1  VIEW  Result Date: 05/18/2022 CLINICAL DATA:  88 year old male with fluid overload and shortness of breath. EXAM: PORTABLE CHEST 1 VIEW COMPARISON:  Portable chest 05/14/2022 and earlier. FINDINGS: Portable AP upright view at 0651 hours. Similar lordotic positioning. Stable cardiac and mediastinal contours with up to mild cardiomegaly. Prior sternotomy and cardiac valve replacement. Increased bilateral pulmonary interstitial opacity, and new veiling opacity at both lung bases suggesting small effusions. No pneumothorax or consolidation. Stable visualized osseous structures. Negative visible bowel gas pattern. IMPRESSION: Progressed pulmonary edema and small pleural effusions since 05/14/2022. Electronically Signed   By: Genevie Ann M.D.   On: 05/18/2022 07:05   DG ESOPHAGUS W SINGLE CM (SOL OR THIN BA)  Result Date: 05/17/2022 CLINICAL DATA:  Patient admitted 05/14/22 with slurred speech, weakness and jerking movements. Patient complained of dysphagia with solids only during admission, speech evaluation at bedside unremarkable. Request for esophagram for further evaluation. EXAM: ESOPHAGUS/BARIUM SWALLOW/TABLET STUDY TECHNIQUE: Single contrast examination was performed using thin liquid barium. This exam was performed by  Candiss Norse, PA-C, and was supervised and interpreted by Nelson Chimes, MD. FLUOROSCOPY: Radiation Exposure Index (as provided by the fluoroscopic device): 44.30 mGy Kerma COMPARISON:  None Available. FINDINGS: Swallowing: Appears normal. No vestibular penetration or aspiration seen. Pharynx: Unremarkable. Esophagus: Normal appearance. Non-occlusive narrowing at gastroesophageal junction. Esophageal motility: Mild dysmotility. Hiatal Hernia: None. Gastroesophageal reflux: None visualized. Ingested 13 mm barium tablet: Became stuck approximately 3 cm above the GE junction. Tablet was observed for 5 minutes without significant change in position despite additional barium and water. Other: Exam  performed in supine LPO due to patient weakness and difficulty positioning. IMPRESSION: Single contrast esophagram significant for mild dysmotility and narrowing at the GE junction. Barium tablet became stuck at the gastroesophageal junction. There may be mild stricture ring at this level. Electronically Signed   By: Nelson Chimes M.D.   On: 05/17/2022 10:07   VAS US CAROTID  Result Date: 05/15/2022 Carotid Arterial Duplex Study Patient Name:  MACHEAL SKAINS  Date of Exam:   05/14/2022 Medical Rec #: [id number]        Accession #:    [id number] Date of Birth: 1933-07-11        Patient Gender: M Patient Age:   21 years Exam Location:  [facility] Procedure:      VAS US CAROTID Referring Phys: Gean Birchwood --------------------------------------------------------------------------------  Indications:   CVA. Risk Factors:  Hypertension, Diabetes, past history of smoking, coronary artery                disease, prior CVA. Other Factors: S/P AVR. Performing Technologist: Darlin Coco RDMS, RVT  Examination Guidelines: A complete evaluation includes B-mode imaging, spectral Doppler, color Doppler, and power Doppler as needed of all accessible portions of each vessel. Bilateral testing is considered an integral part of a complete examination. Limited examinations for reoccurring indications may be performed as noted.  Right Carotid Findings: +----------+--------+--------+--------+------------------+--------+           PSV cm/sEDV cm/sStenosisPlaque DescriptionComments +----------+--------+--------+--------+------------------+--------+ CCA Prox  63      11                                         +----------+--------+--------+--------+------------------+--------+ CCA Distal52      8                                          +----------+--------+--------+--------+------------------+--------+ ICA Prox  45      10      1-39%   heterogenous                +----------+--------+--------+--------+------------------+--------+ ICA Mid   41      9                                          +----------+--------+--------+--------+------------------+--------+ ICA Distal38      9                                          +----------+--------+--------+--------+------------------+--------+ ECA       91      9                                          +----------+--------+--------+--------+------------------+--------+ +----------+--------+-------+----------------+-------------------+  PSV cm/sEDV cmsDescribe        Arm Pressure (mmHG) +----------+--------+-------+----------------+-------------------+ [id number]             Multiphasic, WNL                    +----------+--------+-------+----------------+-------------------+ +---------+--------+--+--------+-+---------+ VertebralPSV cm/s51EDV cm/s8Antegrade +---------+--------+--+--------+-+---------+  Left Carotid Findings: +----------+--------+--------+--------+------------------+--------+           PSV cm/sEDV cm/sStenosisPlaque DescriptionComments +----------+--------+--------+--------+------------------+--------+ CCA Prox  56      7                                          +----------+--------+--------+--------+------------------+--------+ CCA Distal58      12              heterogenous               +----------+--------+--------+--------+------------------+--------+ ICA Prox  67      11      1-39%   heterogenous               +----------+--------+--------+--------+------------------+--------+ ICA Mid   62      12                                         +----------+--------+--------+--------+------------------+--------+ ICA Distal38      10                                         +----------+--------+--------+--------+------------------+--------+ ECA       155     14                                          +----------+--------+--------+--------+------------------+--------+ +----------+--------+--------+----------------+-------------------+           PSV cm/sEDV cm/sDescribe        Arm Pressure (mmHG) +----------+--------+--------+----------------+-------------------+ [id number]              Multiphasic, WNL                    +----------+--------+--------+----------------+-------------------+ +---------+--------+--+--------+-+---------+ VertebralPSV cm/s35EDV cm/s8Antegrade +---------+--------+--+--------+-+---------+   Summary: Right Carotid: Velocities in the right ICA are consistent with a 1-39% stenosis. Left Carotid: Velocities in the left ICA are consistent with a 1-39% stenosis. Vertebrals:  Bilateral vertebral arteries demonstrate antegrade flow. Subclavians: Normal flow hemodynamics were seen in bilateral subclavian              arteries. *See table(s) above for measurements and observations.  Electronically signed by Delia HeadyPramod Sethi MD on 05/15/2022 at 1:55:54 PM.    Final    EEG adult  Result Date: 05/14/2022 Charlsie QuestYadav, Priyanka O, MD     05/14/2022  4:02 PM Patient Name: Ignacia FellingSolomon B Gonyer MRN: [id number] Epilepsy Attending: Charlsie QuestPriyanka O Yadav Referring Physician/Provider: Milon DikesArora, Ashish, MD Date: 05/14/2022 Duration: 23.59 mins Patient history: 88yo M with sudden onset of slurred speech with whole body shaking. EEG to evaluate for seizure Level of alertness: Awake, asleep AEDs during EEG study: None Technical aspects: This EEG study was done with scalp electrodes positioned according to the 10-20 International system of electrode placement. Electrical activity was reviewed with band  pass filter of 1-70Hz , sensitivity of 7 uV/mm, display speed of 93mm/sec with a 60Hz  notched filter applied as appropriate. EEG data were recorded continuously and digitally stored.  Video monitoring was available and reviewed as appropriate. Description: The posterior dominant rhythm consists of 7.5 Hz activity of  moderate voltage (25-35 uV) seen predominantly in posterior head regions, symmetric and reactive to eye opening and eye closing. Sleep was characterized by vertex waves, sleep spindles (12 to 14 Hz), maximal frontocentral region. EEG showed intermittent generalized 3 to 6 Hz theta-delta slowing. Hyperventilation and photic stimulation were not performed.   ABNORMALITY - Intermittent slow, generalized IMPRESSION: This study is suggestive of mild diffuse encephalopathy, nonspecific etiology. No seizures or epileptiform discharges were seen throughout the recording. Lora Havens   ECHOCARDIOGRAM COMPLETE  Result Date: 05/14/2022    ECHOCARDIOGRAM REPORT   Patient Name:   MAXIMAS REISDORF Date of Exam: 05/14/2022 Medical Rec #:  [id number]       Height:       67.0 in Accession #:    [id number]      Weight:       183.0 lb Date of Birth:  March 27, 1933       BSA:          1.947 m Patient Age:    20 years        BP:           188/76 mmHg Patient Gender: M               HR:           63 bpm. Exam Location:  Inpatient Procedure: 2D Echo, Color Doppler and Cardiac Doppler Indications:    Stroke  History:        Patient has prior history of Echocardiogram examinations, most                 recent 04/27/2017. CAD, Stroke, Signs/Symptoms:Murmur; Risk                 Factors:Hypertension and Diabetes.                 Aortic Valve: bioprosthetic valve is present in the aortic                 position.  Sonographer:    Memory Argue Referring Phys: [place]  1. Left ventricular ejection fraction, by estimation, is 60 to 65%. The left ventricle has normal function. The left ventricle has no regional wall motion abnormalities. There is mild concentric left ventricular hypertrophy. Left ventricular diastolic parameters are consistent with Grade II diastolic dysfunction (pseudonormalization). Elevated left atrial pressure.  2. Right ventricular systolic function is normal. The right ventricular size is  normal. Tricuspid regurgitation signal is inadequate for assessing PA pressure.  3. Left atrial size was mildly dilated.  4. The mitral valve is normal in structure. Trivial mitral valve regurgitation. No evidence of mitral stenosis.  5. The aortic valve has been repaired/replaced. Aortic valve regurgitation is not visualized. No aortic stenosis is present. There is a bioprosthetic valve present in the aortic position.  6. The inferior vena cava is normal in size with greater than 50% respiratory variability, suggesting right atrial pressure of 3 mmHg. FINDINGS  Left Ventricle: Left ventricular ejection fraction, by estimation, is 60 to 65%. The left ventricle has normal function. The left ventricle has no regional wall motion abnormalities. The left ventricular internal cavity size was normal in size. There  is  mild concentric left ventricular hypertrophy. Left ventricular diastolic parameters are consistent with Grade II diastolic dysfunction (pseudonormalization). Elevated left atrial pressure. Right Ventricle: The right ventricular size is normal. Right ventricular systolic function is normal. Tricuspid regurgitation signal is inadequate for assessing PA pressure. The tricuspid regurgitant velocity is 1.87 m/s, and with an assumed right atrial  pressure of 3 mmHg, the estimated right ventricular systolic pressure is 123XX123 mmHg. Left Atrium: Left atrial size was mildly dilated. Right Atrium: Right atrial size was normal in size. Pericardium: There is no evidence of pericardial effusion. Mitral Valve: The mitral valve is normal in structure. Mild mitral annular calcification. Trivial mitral valve regurgitation. No evidence of mitral valve stenosis. Tricuspid Valve: The tricuspid valve is normal in structure. Tricuspid valve regurgitation is trivial. No evidence of tricuspid stenosis. Aortic Valve: The aortic valve has been repaired/replaced. Aortic valve regurgitation is not visualized. No aortic stenosis is  present. Aortic valve mean gradient measures 8.0 mmHg. Aortic valve peak gradient measures 15.8 mmHg. Aortic valve area, by VTI  measures 1.56 cm. There is a bioprosthetic valve present in the aortic position. Pulmonic Valve: The pulmonic valve was normal in structure. Pulmonic valve regurgitation is not visualized. No evidence of pulmonic stenosis. Aorta: The aortic root is normal in size and structure. Venous: The inferior vena cava is normal in size with greater than 50% respiratory variability, suggesting right atrial pressure of 3 mmHg. IAS/Shunts: No atrial level shunt detected by color flow Doppler.  LEFT VENTRICLE PLAX 2D LVIDd:         4.50 cm   Diastology LVIDs:         3.10 cm   LV e' medial:    6.06 cm/s LV PW:         1.20 cm   LV E/e' medial:  16.5 LV IVS:        1.20 cm   LV e' lateral:   8.24 cm/s LVOT diam:     2.10 cm   LV E/e' lateral: 12.1 LV SV:         65 LV SV Index:   34 LVOT Area:     3.46 cm  RIGHT VENTRICLE TAPSE (M-mode): 1.7 cm LEFT ATRIUM             Index        RIGHT ATRIUM           Index LA Vol (A2C):   50.7 ml 26.04 ml/m  RA Area:     12.10 cm LA Vol (A4C):   65.5 ml 33.64 ml/m  RA Volume:   26.30 ml  13.51 ml/m LA Biplane Vol: 59.3 ml 30.45 ml/m  AORTIC VALVE AV Area (Vmax):    1.17 cm AV Area (Vmean):   1.16 cm AV Area (VTI):     1.56 cm AV Vmax:           199.00 cm/s AV Vmean:          133.000 cm/s AV VTI:            0.419 m AV Peak Grad:      15.8 mmHg AV Mean Grad:      8.0 mmHg LVOT Vmax:         67.40 cm/s LVOT Vmean:        44.500 cm/s LVOT VTI:          0.189 m LVOT/AV VTI ratio: 0.45  AORTA Ao Root diam: 2.50 cm Ao Asc diam:  2.90  cm MITRAL VALVE                TRICUSPID VALVE MV Area (PHT): 4.39 cm     TR Peak grad:   14.0 mmHg MV Decel Time: 173 msec     TR Vmax:        187.00 cm/s MV E velocity: 100.00 cm/s MV A velocity: 93.10 cm/s   SHUNTS MV E/A ratio:  1.07         Systemic VTI:  0.19 m                             Systemic Diam: 2.10 cm Kirk Ruths MD  Electronically signed by Kirk Ruths MD Signature Date/Time: 05/14/2022/1:36:46 PM    Final    DG CHEST PORT 1 VIEW  Result Date: 05/14/2022 CLINICAL DATA:  Leukocytosis EXAM: PORTABLE CHEST 1 VIEW COMPARISON:  04/30/2017 FINDINGS: Chronic cardiac enlargement. Prior CABG and aortic valve repair. Diffuse interstitial prominence, likely vascular congestion. Asymmetric indistinct density at the medial right base could be pneumonia. No effusion or pneumothorax. IMPRESSION: 1. Cardiomegaly and vascular congestion. 2. Cannot exclude pneumonia at the medial right base. Electronically Signed   By: Jorje Guild M.D.   On: 05/14/2022 05:36   MR BRAIN WO CONTRAST  Result Date: 05/14/2022 CLINICAL DATA:  Follow-up examination for neuro deficit, stroke suspected. EXAM: MRI HEAD WITHOUT CONTRAST MRA HEAD WITHOUT CONTRAST TECHNIQUE: Multiplanar, multi-echo pulse sequences of the brain and surrounding structures were acquired without intravenous contrast. Angiographic images of the Circle of Willis were acquired using MRA technique without intravenous contrast. COMPARISON:  Prior CT from 05/13/2022. FINDINGS: MRI HEAD FINDINGS Brain: Diffuse prominence of the CSF containing spaces compatible generalized cerebral atrophy. Patchy and confluent T2/FLAIR hyperintensity involving the periventricular and deep white matter both cerebral hemispheres, consistent with chronic small vessel ischemic disease, mild to moderate in nature. Few scatter remote lacunar infarcts present about the hemispheric cerebral white matter, right basal ganglia, and pons. Few small remote bilateral cerebellar infarcts, right slightly worse than left. No abnormal foci of restricted diffusion to suggest acute or subacute ischemia. Gray-white matter differentiation maintained. No other areas of chronic cortical infarction. Single chronic microhemorrhage noted within the right cerebellum. No other significant acute or chronic intracranial blood  products. No mass lesion, midline shift or mass effect. No hydrocephalus or extra-axial fluid collection. Pituitary gland and suprasellar region within normal limits. Vascular: Major intracranial vascular flow voids are maintained. Vertebrobasilar system is somewhat tortuous, partially indenting the medulla. Skull and upper cervical spine: Craniocervical junction within normal limits. Bone marrow signal intensity normal. No scalp soft tissue abnormality. Sinuses/Orbits: Irregular signal abnormality at the posterolateral right globe, again suggestive of possible vitreous hemorrhage (series 19, image 9). Paranasal sinuses are largely clear. No significant mastoid effusion. Other: None. MRA HEAD FINDINGS Anterior circulation: Both internal carotid arteries patent to the termini without stenosis or other abnormality. A1 segments patent bilaterally. Normal anterior communicating artery complex. Atheromatous irregularity within both ACAs without proximal high-grade stenosis. No M1 stenosis or occlusion. Moderate to severe multifocal bilateral M2 stenoses noted (series 1054, images 3, 11). Extensive small vessel atheromatous irregularity seen distally throughout the MCA branches. Posterior circulation: Both vertebral arteries patent without stenosis. Left PICA patent at its origin. Right PICA not well seen. Basilar patent to its distal aspect without stenosis. Superior cerebellar arteries patent bilaterally. Both PCAs primarily supplied via the basilar. Moderate multifocal right P2 stenoses (series 1036, image  13). Distal small vessel atheromatous irregularity noted within both PCAs. Anatomic variants: None significant.  No aneurysm. IMPRESSION: MRI HEAD IMPRESSION: 1. No acute intracranial abnormality. 2. Generalized cerebral atrophy with chronic microvascular ischemic disease, with a few scattered remote lacunar infarcts involving the hemispheric cerebral white matter, right basal ganglia, and pons. 3. Few small remote  bilateral cerebellar infarcts, right worse than left. MRA HEAD IMPRESSION: 1. Negative intracranial MRA for large vessel occlusion. 2. Intracranial atherosclerotic disease with associated moderate to severe multifocal bilateral M2 and right P2 stenoses. Extensive distal small vessel atheromatous irregularity. Electronically Signed   By: Jeannine Boga M.D.   On: 05/14/2022 03:56   MR ANGIO HEAD WO CONTRAST  Result Date: 05/14/2022 CLINICAL DATA:  Follow-up examination for neuro deficit, stroke suspected. EXAM: MRI HEAD WITHOUT CONTRAST MRA HEAD WITHOUT CONTRAST TECHNIQUE: Multiplanar, multi-echo pulse sequences of the brain and surrounding structures were acquired without intravenous contrast. Angiographic images of the Circle of Willis were acquired using MRA technique without intravenous contrast. COMPARISON:  Prior CT from 05/13/2022. FINDINGS: MRI HEAD FINDINGS Brain: Diffuse prominence of the CSF containing spaces compatible generalized cerebral atrophy. Patchy and confluent T2/FLAIR hyperintensity involving the periventricular and deep white matter both cerebral hemispheres, consistent with chronic small vessel ischemic disease, mild to moderate in nature. Few scatter remote lacunar infarcts present about the hemispheric cerebral white matter, right basal ganglia, and pons. Few small remote bilateral cerebellar infarcts, right slightly worse than left. No abnormal foci of restricted diffusion to suggest acute or subacute ischemia. Gray-white matter differentiation maintained. No other areas of chronic cortical infarction. Single chronic microhemorrhage noted within the right cerebellum. No other significant acute or chronic intracranial blood products. No mass lesion, midline shift or mass effect. No hydrocephalus or extra-axial fluid collection. Pituitary gland and suprasellar region within normal limits. Vascular: Major intracranial vascular flow voids are maintained. Vertebrobasilar system is  somewhat tortuous, partially indenting the medulla. Skull and upper cervical spine: Craniocervical junction within normal limits. Bone marrow signal intensity normal. No scalp soft tissue abnormality. Sinuses/Orbits: Irregular signal abnormality at the posterolateral right globe, again suggestive of possible vitreous hemorrhage (series 19, image 9). Paranasal sinuses are largely clear. No significant mastoid effusion. Other: None. MRA HEAD FINDINGS Anterior circulation: Both internal carotid arteries patent to the termini without stenosis or other abnormality. A1 segments patent bilaterally. Normal anterior communicating artery complex. Atheromatous irregularity within both ACAs without proximal high-grade stenosis. No M1 stenosis or occlusion. Moderate to severe multifocal bilateral M2 stenoses noted (series 1054, images 3, 11). Extensive small vessel atheromatous irregularity seen distally throughout the MCA branches. Posterior circulation: Both vertebral arteries patent without stenosis. Left PICA patent at its origin. Right PICA not well seen. Basilar patent to its distal aspect without stenosis. Superior cerebellar arteries patent bilaterally. Both PCAs primarily supplied via the basilar. Moderate multifocal right P2 stenoses (series 1036, image 13). Distal small vessel atheromatous irregularity noted within both PCAs. Anatomic variants: None significant.  No aneurysm. IMPRESSION: MRI HEAD IMPRESSION: 1. No acute intracranial abnormality. 2. Generalized cerebral atrophy with chronic microvascular ischemic disease, with a few scattered remote lacunar infarcts involving the hemispheric cerebral white matter, right basal ganglia, and pons. 3. Few small remote bilateral cerebellar infarcts, right worse than left. MRA HEAD IMPRESSION: 1. Negative intracranial MRA for large vessel occlusion. 2. Intracranial atherosclerotic disease with associated moderate to severe multifocal bilateral M2 and right P2 stenoses.  Extensive distal small vessel atheromatous irregularity. Electronically Signed   By: Pincus Badder.D.  On: 05/14/2022 03:56   DG Foot 2 Views Right  Result Date: 05/14/2022 CLINICAL DATA:  [id number]. Osteomyelitis. Last surgery was 2 months ago. EXAM: RIGHT FOOT - 2 VIEW COMPARISON:  Right foot series 07/21/2021. FINDINGS: AP Lat only. Again noted is prior amputation of the right great toe. There are hammertoe deformities of the second through fifth toes. Due to poor positioning and overlapping structures especially on the AP, evaluation is limited. There is no appreciable destructive bone lesion, but the base of the metatarsals are not well seen and the toes are not well seen either. There is profound osteopenia. Midfoot arthrosis is noted and a small plantar calcaneal enthesophyte with spurring at the tibiotalar joint. There is diffuse soft tissue edema. No soft tissue gas or radiopaque foreign body are seen. IMPRESSION: 1. Limited study due to poor positioning and overlapping structures. No appreciable destructive bone lesion. 2. Diffuse soft tissue edema. 3. Previous amputation of the great toe. 4. Osteopenia and degenerative change. Electronically Signed   By: Telford Nab M.D.   On: 05/14/2022 03:52   CT HEAD CODE STROKE WO CONTRAST  Result Date: 05/13/2022 CLINICAL DATA:  Code stroke. Initial evaluation for neuro deficit, stroke suspected. EXAM: CT HEAD WITHOUT CONTRAST TECHNIQUE: Contiguous axial images were obtained from the base of the skull through the vertex without intravenous contrast. RADIATION DOSE REDUCTION: This exam was performed according to the departmental dose-optimization program which includes automated exposure control, adjustment of the mA and/or kV according to patient size and/or use of iterative reconstruction technique. COMPARISON:  Prior study from 03/10/2021. FINDINGS: Brain: Moderately advanced cerebral atrophy with mild chronic small vessel ischemic disease. Small  remote right cerebellar infarct. No acute intracranial hemorrhage. No acute large vessel territory infarct. No mass lesion or midline shift. No hydrocephalus or extra-axial fluid collection. Vascular: No abnormal hyperdense vessel. Calcified atherosclerosis present at the skull base. Skull: Scalp soft tissues and calvarium demonstrate no acute finding. Sinuses/Orbits: Abnormal hyperdensity seen involving the posterior aspect of the right globe, suggestive of possible hemorrhage (series 2, image 9). Postoperative changes noted about the contralateral left globe. Paranasal sinuses are clear. Other: No mastoid effusion. ASPECTS [facility] Stroke Program Early CT Score) - Ganglionic level infarction (caudate, lentiform nuclei, internal capsule, insula, M1-M3 cortex): 7 - Supraganglionic infarction (M4-M6 cortex): 3 Total score (0-10 with 10 being normal): 10 IMPRESSION: 1. No acute intracranial abnormality. 2. ASPECTS is 10. 3. Hyperdensity involving the posterior aspect of the right globe, suggesting vitreous hemorrhage. Correlation with physical exam recommended. 4. Atrophy with chronic microvascular ischemic disease. Small remote right cerebellar infarct. These results were communicated to Dr. Rory Percy at 10:06 pm on 05/13/2022 by text page via the [facility] messaging system. Electronically Signed   By: Jeannine Boga M.D.   On: 05/13/2022 22:08    DISCHARGE EXAMINATION: Vitals:   05/18/22 1942 05/18/22 2312 05/19/22 0404 05/19/22 0742  BP: (!) 175/85 (!) 165/72 (!) 183/78 (!) 177/83  Pulse: 73 66 62 75  Resp: 16 18 18 16   Temp: 99.2 F (37.3 C) 98.2 F (36.8 C) 98.4 F (36.9 C) 98 F (36.7 C)  TempSrc: Axillary Oral Oral Oral  SpO2: 95% 95% 91% 93%  Weight:      Height:       General appearance: Awake alert.  In no distress Resp: Clear to auscultation bilaterally.  Normal effort Cardio: S1-S2 is normal regular.  No S3-S4.  No rubs murmurs or bruit GI: Abdomen is soft.  Nontender nondistended.  Bowel  sounds  are present normal.  No masses organomegaly    DISPOSITION: SNF  Discharge Instructions     Call MD for:  difficulty breathing, headache or visual disturbances   Complete by: As directed    Call MD for:  extreme fatigue   Complete by: As directed    Call MD for:  persistant dizziness or light-headedness   Complete by: As directed    Call MD for:  persistant nausea and vomiting   Complete by: As directed    Call MD for:  severe uncontrolled pain   Complete by: As directed    Call MD for:  temperature >100.4   Complete by: As directed    Discharge instructions   Complete by: As directed    Please review instructions on the discharge summary.  You were cared for by a hospitalist during your hospital stay. If you have any questions about your discharge medications or the care you received while you were in the hospital after you are discharged, you can call the unit and asked to speak with the hospitalist on call if the hospitalist that took care of you is not available. Once you are discharged, your primary care physician will handle any further medical issues. Please note that NO REFILLS for any discharge medications will be authorized once you are discharged, as it is imperative that you return to your primary care physician (or establish a relationship with a primary care physician if you do not have one) for your aftercare needs so that they can reassess your need for medications and monitor your lab values. If you do not have a primary care physician, you can call [phone] for a physician referral.   Increase activity slowly   Complete by: As directed    No wound care   Complete by: As directed           Allergies as of 05/19/2022       Reactions   Gabapentin Other (See Comments)   "Could not talk"   Glyxambi [empagliflozin-linagliptin] Other (See Comments)   Lethargy, slurring speaking   Heparin Other (See Comments)   Heparin-Induced Thrombocytopenia   Tape Other  (See Comments)   Paper tape is preferred        Medication List     STOP taking these medications    acetaminophen 325 MG tablet Commonly known as: TYLENOL   ketoconazole 2 % cream Commonly known as: NIZORAL       TAKE these medications    allopurinol 100 MG tablet Commonly known as: ZYLOPRIM Take 2 tablets (200 mg total) by mouth at bedtime.   amLODipine 10 MG tablet Commonly known as: NORVASC Take 1 tablet (10 mg total) by mouth daily.   aspirin 81 MG chewable tablet Chew 1 tablet (81 mg total) by mouth daily.   cloNIDine 0.1 MG tablet Commonly known as: CATAPRES Take 1 tablet (0.1 mg total) by mouth 3 (three) times daily. What changed: when to take this   cyanocobalamin 1000 MCG tablet Take 1 tablet (1,000 mcg total) by mouth daily. Start taking on: May 20, 2022   docusate sodium 50 MG capsule Commonly known as: COLACE Take 50 mg by mouth every evening.   feeding supplement Liqd Take 237 mLs by mouth 3 (three) times daily between meals.   furosemide 40 MG tablet Commonly known as: LASIX Take 0.5 tablets (20 mg total) by mouth daily. What changed: how much to take   gentamicin cream 0.1 % Commonly known as: GARAMYCIN  Apply 1 application topically 2 (two) times daily. What changed:  when to take this additional instructions   hydrALAZINE 100 MG tablet Commonly known as: APRESOLINE Take 1 tablet (100 mg total) by mouth 3 (three) times daily.   insulin glargine 100 UNIT/ML injection Commonly known as: LANTUS Inject 0.1 mLs (10 Units total) into the skin at bedtime. What changed: how much to take   insulin lispro 100 UNIT/ML injection Commonly known as: HUMALOG Inject 0.1-0.16 mLs (10-16 Units total) into the skin 3 (three) times daily before meals. Below 200 units = 10units 200-250=12 units 250-300=14units Over 300=16units What changed:  how much to take additional instructions   KRILL OIL PO Take 1 capsule by mouth See admin  instructions. Arctic krill oil capsules- Take 1 capsule by mouth once a day   levothyroxine 25 MCG tablet Commonly known as: SYNTHROID Take 25 mcg by mouth daily before breakfast.   metoprolol tartrate 50 MG tablet Commonly known as: LOPRESSOR Take 1 tablet (50 mg total) by mouth 2 (two) times daily.   multivitamin with minerals Tabs tablet Take 1 tablet by mouth daily. Start taking on: May 20, 2022   OVER THE COUNTER MEDICATION Take 1 capsule by mouth See admin instructions. Vitamin D3 5,000 units + K2 100 mcg capsules- Take 1 capsule by mouth once a day   rosuvastatin 10 MG tablet Commonly known as: CRESTOR Take 1 tablet (10 mg total) by mouth daily. What changed: when to take this   senna 8.6 MG Tabs tablet Commonly known as: SENOKOT Take 1 tablet (8.6 mg total) by mouth daily. What changed: when to take this   Zegerid OTC 20-1100 MG Caps capsule Generic drug: Omeprazole-Sodium Bicarbonate Take 1 capsule by mouth daily before breakfast.   zinc gluconate 50 MG tablet Take 1 tablet (50 mg total) by mouth daily.          Follow-up Information     [facility]. Schedule an appointment as soon as possible for a visit in 1 month(s).   Specialty: Neurology Why: stroke clinic Contact information: [place] [phone]                TOTAL DISCHARGE TIME: 35 minutes  [place]  [facility] Pager on [email or web address]  05/19/2022, 9:26 AM

## 2022-05-19 NOTE — Progress Notes (Signed)
Mobility Specialist: Progress Note   05/19/22 1221  Mobility  Activity Ambulated with assistance in hallway  Level of Assistance Moderate assist, patient does 50-74%  Assistive Device Front wheel walker  Distance Ambulated (ft) 44 ft (16'+28')  Activity Response Tolerated fair  Mobility Referral Yes  $Mobility charge 1 Mobility   During-Mobility: 90% SpO2  Pt received in the bed and agreeable to mobility. MinA with bed mobility and modA to stand. Ambulated on 2 L/min [place]. Stopped x2 for seated break secondary to fatigue. Increased unsteadiness with distance. Pt wheeled back to the room after second bout. Pt left in the chair with call bell and phone in reach. Chair alarm is on.   Nakhia Levitan Mobility Specialist Secure Chat Only

## 2022-05-19 NOTE — TOC Transition Note (Signed)
Transition of Care [facility]) - CM/SW Discharge Note   Patient Details  Name: Johnny [name] MRN: [id number] Date of Birth: 1932/12/26  [facility] CM/SW Contact:  Carley Hammed, LCSWA Phone Number: 05/19/2022, 1:46 PM   Clinical Narrative:    Pt to be transported to [facility] via [place]. Nurse to call report to [phone]   Final next level of care: Skilled Nursing Facility Barriers to Discharge: Barriers Resolved   Patient Goals and CMS Choice Patient states their goals for this hospitalization and ongoing recovery are:: get stronger      Discharge Placement              Patient chooses bed at: [facility] Patient to be transferred to facility by: [facility] Name of family member notified: Carney Bern Patient and family notified of of transfer: 05/19/22  Discharge Plan and Services In-house Referral: Clinical Social Work                                   Social Determinants of Health (SDOH) Interventions     Readmission Risk Interventions     No data to display

## 2022-05-26 ENCOUNTER — Encounter: Payer: Medicare Other | Admitting: Podiatry

## 2022-06-02 ENCOUNTER — Encounter: Payer: Medicare Other | Admitting: Podiatry

## 2022-06-16 ENCOUNTER — Encounter: Payer: Medicare Other | Admitting: Podiatry

## 2022-08-12 DEATH — deceased
